# Patient Record
Sex: Female | Born: 1950 | Race: Black or African American | Hispanic: No | Smoking: Former smoker
Health system: Southern US, Community
[De-identification: ages and names within clinical notes are randomized; demographics above are authoritative.]

## PROBLEM LIST (undated history)

## (undated) DIAGNOSIS — C801 Malignant (primary) neoplasm, unspecified: Secondary | ICD-10-CM

## (undated) DIAGNOSIS — E785 Hyperlipidemia, unspecified: Secondary | ICD-10-CM

## (undated) DIAGNOSIS — M7660 Achilles tendinitis, unspecified leg: Secondary | ICD-10-CM

## (undated) DIAGNOSIS — Z923 Personal history of irradiation: Secondary | ICD-10-CM

## (undated) DIAGNOSIS — C9000 Multiple myeloma not having achieved remission: Secondary | ICD-10-CM

## (undated) DIAGNOSIS — R42 Dizziness and giddiness: Secondary | ICD-10-CM

## (undated) DIAGNOSIS — I10 Essential (primary) hypertension: Secondary | ICD-10-CM

## (undated) DIAGNOSIS — D509 Iron deficiency anemia, unspecified: Secondary | ICD-10-CM

## (undated) DIAGNOSIS — K219 Gastro-esophageal reflux disease without esophagitis: Secondary | ICD-10-CM

## (undated) DIAGNOSIS — E559 Vitamin D deficiency, unspecified: Secondary | ICD-10-CM

## (undated) DIAGNOSIS — E669 Obesity, unspecified: Secondary | ICD-10-CM

## (undated) DIAGNOSIS — M179 Osteoarthritis of knee, unspecified: Secondary | ICD-10-CM

## (undated) DIAGNOSIS — M1710 Unilateral primary osteoarthritis, unspecified knee: Secondary | ICD-10-CM

## (undated) DIAGNOSIS — G35 Multiple sclerosis: Secondary | ICD-10-CM

## (undated) HISTORY — PX: ROTATOR CUFF REPAIR: SHX139

## (undated) HISTORY — DX: Vitamin D deficiency, unspecified: E55.9

## (undated) HISTORY — DX: Hyperlipidemia, unspecified: E78.5

## (undated) HISTORY — DX: Osteoarthritis of knee, unspecified: M17.9

## (undated) HISTORY — PX: BREAST EXCISIONAL BIOPSY: SUR124

## (undated) HISTORY — PX: CHOLECYSTECTOMY: SHX55

## (undated) HISTORY — PX: ABDOMINAL HYSTERECTOMY: SHX81

## (undated) HISTORY — DX: Achilles tendinitis, unspecified leg: M76.60

## (undated) HISTORY — DX: Dizziness and giddiness: R42

## (undated) HISTORY — DX: Unilateral primary osteoarthritis, unspecified knee: M17.10

## (undated) HISTORY — DX: Iron deficiency anemia, unspecified: D50.9

## (undated) HISTORY — DX: Obesity, unspecified: E66.9

## (undated) HISTORY — PX: BREAST SURGERY: SHX581

---

## 1999-10-17 ENCOUNTER — Ambulatory Visit (HOSPITAL_COMMUNITY): Admission: RE | Admit: 1999-10-17 | Discharge: 1999-10-17 | Payer: Self-pay | Admitting: Neurology

## 2001-08-10 ENCOUNTER — Encounter: Admission: RE | Admit: 2001-08-10 | Discharge: 2001-08-10 | Payer: Self-pay | Admitting: Obstetrics and Gynecology

## 2001-08-10 ENCOUNTER — Encounter: Payer: Self-pay | Admitting: Obstetrics and Gynecology

## 2001-10-06 ENCOUNTER — Encounter (INDEPENDENT_AMBULATORY_CARE_PROVIDER_SITE_OTHER): Payer: Self-pay | Admitting: *Deleted

## 2001-10-06 ENCOUNTER — Ambulatory Visit (HOSPITAL_COMMUNITY): Admission: RE | Admit: 2001-10-06 | Discharge: 2001-10-06 | Payer: Self-pay | Admitting: Gastroenterology

## 2002-08-12 ENCOUNTER — Encounter: Payer: Self-pay | Admitting: Obstetrics and Gynecology

## 2002-08-12 ENCOUNTER — Encounter: Admission: RE | Admit: 2002-08-12 | Discharge: 2002-08-12 | Payer: Self-pay | Admitting: Obstetrics and Gynecology

## 2003-07-27 ENCOUNTER — Encounter: Admission: RE | Admit: 2003-07-27 | Discharge: 2003-07-27 | Payer: Self-pay | Admitting: Emergency Medicine

## 2004-04-15 ENCOUNTER — Emergency Department (HOSPITAL_COMMUNITY): Admission: EM | Admit: 2004-04-15 | Discharge: 2004-04-15 | Payer: Self-pay | Admitting: Emergency Medicine

## 2004-06-21 ENCOUNTER — Observation Stay (HOSPITAL_COMMUNITY): Admission: RE | Admit: 2004-06-21 | Discharge: 2004-06-22 | Payer: Self-pay | Admitting: General Surgery

## 2004-06-21 ENCOUNTER — Encounter (INDEPENDENT_AMBULATORY_CARE_PROVIDER_SITE_OTHER): Payer: Self-pay | Admitting: *Deleted

## 2004-06-29 ENCOUNTER — Emergency Department (HOSPITAL_COMMUNITY): Admission: EM | Admit: 2004-06-29 | Discharge: 2004-06-29 | Payer: Self-pay | Admitting: Emergency Medicine

## 2004-07-15 ENCOUNTER — Emergency Department (HOSPITAL_COMMUNITY): Admission: EM | Admit: 2004-07-15 | Discharge: 2004-07-15 | Payer: Self-pay | Admitting: Emergency Medicine

## 2004-08-21 ENCOUNTER — Encounter: Admission: RE | Admit: 2004-08-21 | Discharge: 2004-08-21 | Payer: Self-pay | Admitting: Obstetrics and Gynecology

## 2005-01-28 ENCOUNTER — Emergency Department (HOSPITAL_COMMUNITY): Admission: EM | Admit: 2005-01-28 | Discharge: 2005-01-28 | Payer: Self-pay | Admitting: Emergency Medicine

## 2005-02-03 ENCOUNTER — Encounter (INDEPENDENT_AMBULATORY_CARE_PROVIDER_SITE_OTHER): Payer: Self-pay | Admitting: Specialist

## 2005-02-03 ENCOUNTER — Ambulatory Visit (HOSPITAL_COMMUNITY): Admission: RE | Admit: 2005-02-03 | Discharge: 2005-02-03 | Payer: Self-pay | Admitting: Gastroenterology

## 2005-08-26 ENCOUNTER — Encounter: Admission: RE | Admit: 2005-08-26 | Discharge: 2005-08-26 | Payer: Self-pay | Admitting: Obstetrics and Gynecology

## 2005-09-09 ENCOUNTER — Encounter: Admission: RE | Admit: 2005-09-09 | Discharge: 2005-09-09 | Payer: Self-pay | Admitting: Obstetrics and Gynecology

## 2005-10-02 ENCOUNTER — Encounter: Admission: RE | Admit: 2005-10-02 | Discharge: 2005-10-02 | Payer: Self-pay | Admitting: General Surgery

## 2005-10-07 ENCOUNTER — Ambulatory Visit (HOSPITAL_BASED_OUTPATIENT_CLINIC_OR_DEPARTMENT_OTHER): Admission: RE | Admit: 2005-10-07 | Discharge: 2005-10-07 | Payer: Self-pay | Admitting: General Surgery

## 2005-10-07 ENCOUNTER — Encounter (INDEPENDENT_AMBULATORY_CARE_PROVIDER_SITE_OTHER): Payer: Self-pay | Admitting: *Deleted

## 2005-10-07 ENCOUNTER — Encounter: Admission: RE | Admit: 2005-10-07 | Discharge: 2005-10-07 | Payer: Self-pay | Admitting: General Surgery

## 2006-09-01 ENCOUNTER — Encounter: Admission: RE | Admit: 2006-09-01 | Discharge: 2006-09-01 | Payer: Self-pay | Admitting: Obstetrics and Gynecology

## 2007-09-06 ENCOUNTER — Encounter: Admission: RE | Admit: 2007-09-06 | Discharge: 2007-09-06 | Payer: Self-pay | Admitting: Obstetrics and Gynecology

## 2007-09-14 ENCOUNTER — Encounter: Admission: RE | Admit: 2007-09-14 | Discharge: 2007-09-14 | Payer: Self-pay | Admitting: Obstetrics and Gynecology

## 2008-04-23 ENCOUNTER — Emergency Department (HOSPITAL_COMMUNITY): Admission: EM | Admit: 2008-04-23 | Discharge: 2008-04-23 | Payer: Self-pay | Admitting: *Deleted

## 2008-09-07 ENCOUNTER — Encounter: Admission: RE | Admit: 2008-09-07 | Discharge: 2008-09-07 | Payer: Self-pay | Admitting: Emergency Medicine

## 2009-09-11 ENCOUNTER — Encounter: Admission: RE | Admit: 2009-09-11 | Discharge: 2009-09-11 | Payer: Self-pay | Admitting: Emergency Medicine

## 2009-09-14 ENCOUNTER — Encounter: Admission: RE | Admit: 2009-09-14 | Discharge: 2009-09-14 | Payer: Self-pay | Admitting: Emergency Medicine

## 2009-09-18 ENCOUNTER — Encounter: Admission: RE | Admit: 2009-09-18 | Discharge: 2009-09-18 | Payer: Self-pay | Admitting: Emergency Medicine

## 2010-09-01 ENCOUNTER — Encounter: Payer: Self-pay | Admitting: Obstetrics and Gynecology

## 2010-09-01 ENCOUNTER — Encounter: Payer: Self-pay | Admitting: Emergency Medicine

## 2010-09-02 ENCOUNTER — Other Ambulatory Visit: Payer: Self-pay | Admitting: Emergency Medicine

## 2010-09-02 DIAGNOSIS — Z1239 Encounter for other screening for malignant neoplasm of breast: Secondary | ICD-10-CM

## 2010-09-12 ENCOUNTER — Ambulatory Visit
Admission: RE | Admit: 2010-09-12 | Discharge: 2010-09-12 | Disposition: A | Discharge status: Home / Self Care | Payer: BC Managed Care – PPO | Source: Ambulatory Visit | Attending: Emergency Medicine | Admitting: Emergency Medicine

## 2010-09-12 DIAGNOSIS — Z1239 Encounter for other screening for malignant neoplasm of breast: Secondary | ICD-10-CM

## 2010-12-27 NOTE — Op Note (Signed)
NAME:  Morgan Espinoza, DOOLEY NO.:  [id number]   MEDICAL RECORD NO.:  [id number]          PATIENT TYPE:  OBV   LOCATION:  0284                         FACILITY:  [facility]   PHYSICIAN:  Gita Kudo, M.D. DATE OF BIRTH:  Oct 04, 1950   DATE OF PROCEDURE:  06/21/2004  DATE OF DISCHARGE:                                 OPERATIVE REPORT   OPERATIVE PROCEDURE:  Laparoscopic cholecystectomy with intraoperative  cholangiogram.   SURGEON:  Gita Kudo, M.D.   ASSISTANT:  Angelia Mould. Derrell Lolling, M.D.   ANESTHESIA:  General endotracheal.   PREOPERATIVE DIAGNOSIS:  Gallstones.   POSTOPERATIVE DIAGNOSIS:  Gallstones.  Normal cholangiogram.   CLINICAL SUMMARY:  A 61 year old female with abdominal pain and gallbladder  ultrasound showing stones.  Her liver function studies are normal.   OPERATIVE FINDINGS:  Patient had adhesions from her previous abdominal  procedures.  The gallbladder was thin-walled.  It was not acutely inflamed.  There were no problems with the cholangiogram, which looked normal.   OPERATIVE PROCEDURE:  Under satisfactory general endotracheal anesthesia,  the patient's abdomen was prepped and draped in a standard fashion.  She  received 1 gm of Ancef preop.  A total of 25 cc of 0.5% Marcaine with  epinephrine was infiltrated for postop analgesia.   A midline incision was made below the umbilicus and old sutures identified  and cut through.  The peritoneum was entered and controlled with a figure-of-  eight 0 Vicryl suture.  Finger dissection used to develop a plane and a  Hasson port placed and secured.  Good CO2 insufflation accomplished, and  then the camera placed.  Under direct vision, two #5 ports placed laterally  and a second #10 port medially.  Operating through the medial port, with the  lateral graspers giving excellent exposure, the cystic duct and artery each  were identified and dissected circumferentially.  The artery was controlled  with  multiple clips and divided.  A single clip placed on the cystic duct  near the gallbladder.  A percutaneous catheter used to obtain a good  cholangiogram.  The catheter withdrawn, and the cystic duct controlled with  multiple clips and divided as was the artery.  The gallbladder removed from  below-upward using coagulating current for hemostasis and dissection.  A  second posterior artery was identified and divided between clips.  The  gallbladder was thin-walled, and a hole made in it during the dissection  with spillage of clear bile that was suctioned away.  After completing the  dissection, the liver bed was checked for hemostasis by cautery.  The  gallbladder was placed in an EndoCatch bag and then the camera moved to the  upper port.  The gallbladder was removed through the umbilicus and tacked  into the bag without further problems or spillage.  Then the abdomen was  lavaged copiously with 2 liters of saline, and the returns were clear.  CO2  and ports released.  The midline closed with the previous figure-of-eight  suture as well as three interrupted 0 Vicryl sutures widely placed  through the  fascia because of a previous surgery.  Then subcu approximated  with 4-0 Vicryl.  Steri-Strips for skin.  Sterile absorbent dressings were  applied, and the patient went to the recovery room from the operating room  in good condition without complication.      MRL/MEDQ  D:  06/21/2004  T:  06/21/2004  Job:  [id number]   cc:   Brett Canales A. Cleta Alberts, M.D.  [address]  [place]  [place] [address]  Fax: [phone]

## 2010-12-27 NOTE — Op Note (Signed)
NAME:  Morgan Espinoza, Morgan Espinoza NO.:  [id number]   MEDICAL RECORD NO.:  [id number]          PATIENT TYPE:  AMB   LOCATION:  DSC                          FACILITY:  [facility]   PHYSICIAN:  Gita Kudo, M.D. DATE OF BIRTH:  31-Aug-1950   DATE OF PROCEDURE:  10/07/2005  DATE OF DISCHARGE:                                 OPERATIVE REPORT   OPERATIVE PROCEDURE:  Left breast biopsy with needle localization and  specimen mammogram.   SURGEON:  Gita Kudo, M.D.   ANESTHESIA:  MAC - IV sedation, local 1% Xylocaine.   PREOPERATIVE DIAGNOSIS:  Mass left breast, abnormal mammogram.   POSTOPERATIVE DIAGNOSIS:  Mass left breast, abnormal mammogram, pending  pathology.   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   OPERATIVE FINDINGS:  I went widely around the wire and took a generous  specimen.  Specimen mammogram showed that the lesion was removed.   OPERATIVE PROCEDURE:  Under satisfactory intravenous sedation, the patient  was positioned, prepped and draped in standard fashion.  A total of 30 mL of  1% Xylocaine was infiltrated for good analgesia.  A crescent shaped incision  made around the wire and then cautery was used to remove a large portion of  breast tissue widely around and deep to the wire and I only encountered the  tip of the wire at the very end, meaning to me, that the lesion was  included.  This was then sent for specimen mammogram after marking it with  sutures for the pathologist.  The wound was made dry by cautery, lavaged  with saline, and closed in layers with 3-0 Vicryl and skin approximated with  nylon.  A sterile absorbent dressing applied when we heard from x-ray that  the lesion was removed.  She will be followed as an outpatient. Sponge and  needles correct.           ______________________________  Gita Kudo, M.D.     MRL/MEDQ  D:   10/07/2005  T:  10/07/2005  Job:  [id number]   cc:   Brett Canales A. Cleta Alberts, M.D.  Fax: [phone]   S. Kyra Manges, M.D.  Fax: [phone]

## 2010-12-27 NOTE — Op Note (Signed)
NAME:  Morgan Espinoza, Morgan Espinoza                  ACCOUNT NO.:  [id number]   MEDICAL RECORD NO.:  [id number]          PATIENT TYPE:  AMB   LOCATION:  ENDO                         FACILITY:  [facility]   PHYSICIAN:  Anselmo Rod, M.D.  DATE OF BIRTH:  09/14/1950   DATE OF PROCEDURE:  02/03/2005  DATE OF DISCHARGE:                                 OPERATIVE REPORT   PROCEDURE PERFORMED:  Colonoscopy with cold biopsies times one.   ENDOSCOPIST:  Charna Elizabeth, M.D.   INSTRUMENT USED:  Olympus video colonoscope.   INDICATIONS FOR PROCEDURE:  The patient is a 60 year old African-American  female with a history of MS, undergoing screening colonoscopy to rule out  colonic polyps, masses, etc.  The patient has a history of colonic polyps  removed in the past.   PREPROCEDURE PREPARATION:  Informed consent was procured from the patient.  The patient was fasted for eight hours prior to the procedure and prepped  with a bottle of magnesium citrate and a gallon of GoLYTELY the night prior  to the procedure.  The risks and benefits of the procedure including a 10%  miss rate for colon polyps or cancers was discussed with the patient as  well.   PREPROCEDURE PHYSICAL:  The patient had stable vital signs.  Neck supple.  Chest clear to auscultation.  S1 and S2 regular.  Abdomen soft with normal  bowel sounds.   DESCRIPTION OF PROCEDURE:  The patient was placed in left lateral decubitus  position and sedated with 60 mg of Demerol and 6 mg of Versed in slow  incremental doses.  Once the patient was adequately sedated and maintained  on low flow oxygen and continuous cardiac monitoring, the Olympus video  colonoscope was advanced from the rectum to the cecum. The appendicular  orifice and ileocecal valve were clearly visualized and photographed.  There  was some residual stool in the colon and multiple washes were done.  No  erosions, ulcerations or diverticula were seen.  A small sessile polyp was  biopsied from  the rectosigmoid colon.  The rest of the exam was  unremarkable.  Retroflexion in the rectum revealed no abnormalities.   IMPRESSION:  Normal colonoscopy up to the cecum except for a small sessile  polyp biopsied from the rectosigmoid colon.   RECOMMENDATIONS:  1.  Repeat colonoscopy is recommended in the next five years unless the      patient develops any abnormal symptoms in the interim.  2.  Await pathology results.  3.  Avoid all nonsteroidals including aspirin for the next two weeks.  4.  Outpatient followup as need arises in the future.  The importance of a      high fiber diet with liberal fluid intake has been emphasized.       JNM/MEDQ  D:  02/03/2005  T:  02/03/2005  Job:  [id number]   cc:   Brett Canales A. Cleta Alberts, M.D.  [address]  [place]  [place] [address]  Fax: [phone]   Genene Churn. Love, M.D.  [address]  [address]  [place]  [place]  [address]  Fax: [phone]

## 2010-12-27 NOTE — Op Note (Signed)
   NAME:  Morgan Espinoza, Morgan Espinoza NO.:  [id number]   MEDICAL RECORD NO.:  [id number]                   PATIENT TYPE:   LOCATION:                                       FACILITY:   PHYSICIAN:  Anselmo Rod, M.D.               DATE OF BIRTH:  [birth year]   DATE OF PROCEDURE:  10/06/2001  DATE OF DISCHARGE:                                 OPERATIVE REPORT   PROCEDURE PERFORMED:  Colonoscopy with snare polypectomy x2.   ENDOSCOPIST:  Anselmo Rod, M.D.   INSTRUMENT USED:  Olympus video colonoscope.   INDICATIONS FOR PROCEDURE:  Iron deficiency anemia in a 60 year old African-  American female.  Rule out colonic polyps, masses, etc.   PROCEDURE PREPARATION:  Informed consent was procured from the patient.  The  patient fasted for eight hours prior to the procedure and prepped with a  bottle of magnesium citrate and a gallon of NuLytely the night prior to the  procedure.   PREPROCEDURE PHYSICAL:  VITAL SIGNS:  The patient had stable vital signs.  NECK:  Supple.  CHEST:  Clear to auscultation.  S1 and S2 regular.  ABDOMEN:  Soft with normal bowel sounds.  No masses palpable.   DESCRIPTION OF PROCEDURE:  The patient was placed in the left lateral  decubitus position and sedated with an additional 10 mg of Demerol and 1 mg  of Versed intravenously.  Once the patient was adequately sedated and  maintained on low flow oxygen and continuous cardiac monitoring, the Olympus  video colonoscope was advanced in the rectum to the cecum without  difficulty.  A small polyp was snared from the proximal right colon.  Another pedunculated polyp was snared from 75 cm.  The rest of the colonic  mucosa appeared healthy without lesions.   IMPRESSION:  Normal-appearing colon except for two polyps removed as  mentioned above. No other masses or polyps seen.  No evidence of  diverticulosis.    RECOMMENDATIONS:  1. Avoid all nonsteroidals, including aspirin for now.  2.  Await pathology results.  3. Outpatient followup in the next two weeks with further recommendations.                                               Anselmo Rod, M.D.    JNM/MEDQ  D:  07/17/2002  T:  07/17/2002  Job:  [id number]   cc:   Brett Canales A. Cleta Alberts, M.D.  [address]  [place]  [place] [address]  Fax: [phone]

## 2010-12-27 NOTE — Procedures (Signed)
[place]  Patient:    Morgan Espinoza, Morgan Espinoza                         MRN: [id number] Proc. Date: 10/17/99 Adm. Date:  [id number] Attending:  Erich Montane                           Procedure Report  CLINICAL INFORMATION:  This patient is being evaluated for a history of gait disorder and abnormal MRI study, rule out demyelinating disease.  OPERATOR:  Genene Churn. Love, M.D.  DESCRIPTION OF PROCEDURE:  The patient was prepped and draped in the left lateral decubitus position and was very tense during the procedure. Betadine and 1% Xylocaine were used.  The L4-L5 interspace was entered without difficulty. Opening pressure was 200 mmH2O and clear colorless CSF was obtained and sent for VDRL, angiotensin converting enzyme, protein, glucose, cell count, diff, IgG and oligoclonal IgG.  The patient tolerated the procedure well. DD:  10/17/99 TD:  10/18/99 Job: [id number]

## 2010-12-30 ENCOUNTER — Emergency Department (HOSPITAL_COMMUNITY): Payer: BC Managed Care – PPO

## 2010-12-30 ENCOUNTER — Emergency Department (HOSPITAL_COMMUNITY)
Admission: EM | Admit: 2010-12-30 | Discharge: 2010-12-30 | Disposition: A | Discharge status: Home / Self Care | Payer: BC Managed Care – PPO | Attending: Emergency Medicine | Admitting: Emergency Medicine

## 2010-12-30 DIAGNOSIS — Z90710 Acquired absence of both cervix and uterus: Secondary | ICD-10-CM | POA: Insufficient documentation

## 2010-12-30 DIAGNOSIS — G35 Multiple sclerosis: Secondary | ICD-10-CM | POA: Insufficient documentation

## 2010-12-30 DIAGNOSIS — Z9089 Acquired absence of other organs: Secondary | ICD-10-CM | POA: Insufficient documentation

## 2010-12-30 DIAGNOSIS — N200 Calculus of kidney: Secondary | ICD-10-CM | POA: Insufficient documentation

## 2010-12-30 DIAGNOSIS — M549 Dorsalgia, unspecified: Secondary | ICD-10-CM | POA: Insufficient documentation

## 2010-12-30 DIAGNOSIS — M542 Cervicalgia: Secondary | ICD-10-CM | POA: Insufficient documentation

## 2010-12-30 DIAGNOSIS — S335XXA Sprain of ligaments of lumbar spine, initial encounter: Secondary | ICD-10-CM | POA: Insufficient documentation

## 2010-12-30 DIAGNOSIS — M51379 Other intervertebral disc degeneration, lumbosacral region without mention of lumbar back pain or lower extremity pain: Secondary | ICD-10-CM | POA: Insufficient documentation

## 2010-12-30 DIAGNOSIS — Y92410 Unspecified street and highway as the place of occurrence of the external cause: Secondary | ICD-10-CM | POA: Insufficient documentation

## 2010-12-30 DIAGNOSIS — R079 Chest pain, unspecified: Secondary | ICD-10-CM | POA: Insufficient documentation

## 2010-12-30 DIAGNOSIS — I10 Essential (primary) hypertension: Secondary | ICD-10-CM | POA: Insufficient documentation

## 2010-12-30 DIAGNOSIS — M5137 Other intervertebral disc degeneration, lumbosacral region: Secondary | ICD-10-CM | POA: Insufficient documentation

## 2010-12-30 DIAGNOSIS — S139XXA Sprain of joints and ligaments of unspecified parts of neck, initial encounter: Secondary | ICD-10-CM | POA: Insufficient documentation

## 2010-12-30 DIAGNOSIS — R51 Headache: Secondary | ICD-10-CM | POA: Insufficient documentation

## 2010-12-30 LAB — DIFFERENTIAL
Basophils Relative: 0 % (ref 0–1)
Eosinophils Absolute: 0.2 10*3/uL (ref 0.0–0.7)
Eosinophils Relative: 3 % (ref 0–5)
Lymphs Abs: 2.9 10*3/uL (ref 0.7–4.0)
Monocytes Absolute: 0.5 10*3/uL (ref 0.1–1.0)
Monocytes Relative: 7 % (ref 3–12)

## 2010-12-30 LAB — BASIC METABOLIC PANEL
BUN: 14 mg/dL (ref 6–23)
Calcium: 8.7 mg/dL (ref 8.4–10.5)
Chloride: 105 mEq/L (ref 96–112)
Creatinine, Ser: 0.65 mg/dL (ref 0.4–1.2)
GFR calc non Af Amer: >60 mL/min (ref >60)

## 2010-12-30 LAB — CBC
HCT: 37.9 % (ref 36.0–46.0)
Hemoglobin: 12.1 g/dL (ref 12.0–15.0)
MCH: 28.5 pg (ref 26.0–34.0)
MCHC: 31.9 g/dL (ref 30.0–36.0)
MCV: 89.4 fL (ref 78.0–100.0)
Platelets: 184 K/uL (ref 150–400)
RBC: 4.24 MIL/uL (ref 3.87–5.11)
RDW: 12.6 % (ref 11.5–15.5)
WBC: 6.9 K/uL (ref 4.0–10.5)

## 2010-12-30 MED ORDER — IOHEXOL 300 MG/ML  SOLN
100.0000 mL | Freq: Once | INTRAMUSCULAR | Status: AC | PRN
  Administered 2010-12-30: 100 mL via INTRAVENOUS

## 2011-05-14 LAB — URINALYSIS, ROUTINE W REFLEX MICROSCOPIC
Glucose, UA: NEGATIVE
Ketones, ur: NEGATIVE
Protein, ur: NEGATIVE
Urobilinogen, UA: 1

## 2011-05-14 LAB — URINE CULTURE

## 2011-06-05 ENCOUNTER — Emergency Department (HOSPITAL_COMMUNITY): Payer: BC Managed Care – PPO

## 2011-06-05 ENCOUNTER — Emergency Department (HOSPITAL_COMMUNITY)
Admission: EM | Admit: 2011-06-05 | Discharge: 2011-06-05 | Disposition: A | Discharge status: Home / Self Care | Payer: BC Managed Care – PPO | Attending: Emergency Medicine | Admitting: Emergency Medicine

## 2011-06-05 ENCOUNTER — Encounter (HOSPITAL_COMMUNITY): Payer: Self-pay | Admitting: Family Medicine

## 2011-06-05 DIAGNOSIS — R079 Chest pain, unspecified: Secondary | ICD-10-CM | POA: Diagnosis present

## 2011-06-05 DIAGNOSIS — I10 Essential (primary) hypertension: Secondary | ICD-10-CM

## 2011-06-05 DIAGNOSIS — J45909 Unspecified asthma, uncomplicated: Secondary | ICD-10-CM | POA: Insufficient documentation

## 2011-06-05 DIAGNOSIS — G35 Multiple sclerosis: Secondary | ICD-10-CM | POA: Insufficient documentation

## 2011-06-05 DIAGNOSIS — R0789 Other chest pain: Secondary | ICD-10-CM

## 2011-06-05 DIAGNOSIS — Z79899 Other long term (current) drug therapy: Secondary | ICD-10-CM | POA: Insufficient documentation

## 2011-06-05 DIAGNOSIS — K219 Gastro-esophageal reflux disease without esophagitis: Secondary | ICD-10-CM | POA: Insufficient documentation

## 2011-06-05 DIAGNOSIS — Z9889 Other specified postprocedural states: Secondary | ICD-10-CM | POA: Insufficient documentation

## 2011-06-05 HISTORY — DX: Multiple sclerosis: G35

## 2011-06-05 HISTORY — DX: Essential (primary) hypertension: I10

## 2011-06-05 HISTORY — DX: Gastro-esophageal reflux disease without esophagitis: K21.9

## 2011-06-05 LAB — DIFFERENTIAL

## 2011-06-05 LAB — COMPREHENSIVE METABOLIC PANEL
Alkaline Phosphatase: 101 U/L (ref 39–117)
BUN: 15 mg/dL (ref 6–23)
Chloride: 107 mEq/L (ref 96–112)
GFR calc Af Amer: >90 mL/min (ref >90)
Glucose, Bld: 103 mg/dL — ABNORMAL HIGH (ref 70–99)
Potassium: 3.8 mEq/L (ref 3.5–5.1)
Total Bilirubin: 0.3 mg/dL (ref 0.3–1.2)

## 2011-06-05 LAB — CBC
Hemoglobin: 11.3 g/dL — ABNORMAL LOW (ref 12.0–15.0)
MCH: 29.2 pg (ref 26.0–34.0)
MCV: 90.7 fL (ref 78.0–100.0)
RBC: 3.87 MIL/uL (ref 3.87–5.11)

## 2011-06-05 LAB — POCT I-STAT TROPONIN I: Troponin i, poc: 0.01 ng/mL (ref 0.00–0.08)

## 2011-06-05 NOTE — H&P (Signed)
Family Medicine Teaching [facility] Admission History and Physical  Patient name: Morgan Espinoza Medical record number: [id number] Date of birth: 03/17/1951 Age: 60 y.o. Gender: female  Primary Care Provider: Provider Not In System  Chief Complaint: Chest pain History of Present Illness: Morgan Espinoza is a 60 y.o. year old female presenting with chest pain. The patient reports that yesterday afternoon, she had a large meal, and draped her yard. She was feeling some indigestion at night before she went to bed, so she drank a Sprite and then went to bed. She awoke at around 2 in the morning with central chest and bilateral side pain. The pain was achy in character, did not have any radiation, was not related to exertion, and was made somewhat worse when lying on either side. There was no change when going from supine to standing. She proceeded to get up, use the restroom, and drinks a more Sprite. The pain subsided slightly, and she discovered that if she lay flat on her back she was able to rest. She then went back to sleep. In the morning she was still having problems with this aching pain. Accordingly she went to [facility] urgent care where she was seen by her primary care physician Dr. Elita Quick. He was concerned by the chest pain, ordered an EKG, and noted some mild, nonspecific T wave changes. He sent her to the emergency department for further evaluation.  In the emergency department the patient's pain was initially reported to be a 5/10, substernal, aching, with no radiation. The patient was not given any pain medications for nitroglycerin. Her pain spontaneously resolve, and at the time of this consultation she was not in pain. She did report she had occasional twinges in her side, there were 1/10, lasting less than one second, and resolving spontaneously. EKG obtained at the time of presentation was only notable for some nonspecific T wave changes in lead V3. Otherwise was unremarkable.  Patient  Active Problem List  Diagnoses  . Chest pain  . Hypertension  . Asthma  . GERD (gastroesophageal reflux disease)  . MS (multiple sclerosis)   Past Medical History: Past Medical History  Diagnosis Date  . Hypertension   . GERD (gastroesophageal reflux disease)   . Asthma   . Multiple sclerosis     Past Surgical History: Past Surgical History  Procedure Date  . Cholecystectomy   . Abdominal hysterectomy     Social History: History   Social History  . Marital Status: Widowed    Spouse Name: N/A    Number of Children: N/A  . Years of Education: N/A   Social History Main Topics  . Smoking status: Never Smoker   . Smokeless tobacco: Never Used  . Alcohol Use: No  . Drug Use: No  . Sexually Active:    Other Topics Concern  . None   Social History Narrative  . None    Family History: History reviewed. No pertinent family history.  Allergies: Allergies  Allergen Reactions  . Dristan   . Requip   . Robitussin (Alcohol Free) (Guaifenesin)   . Tramadol     No current facility-administered medications for this encounter.   Current Outpatient Prescriptions  Medication Sig Dispense Refill  . amLODipine (NORVASC) 10 MG tablet Take 10 mg by mouth daily.        . ferrous sulfate 325 (65 FE) MG tablet Take 325 mg by mouth daily with breakfast.        . interferon beta-1b (  BETASERON) 0.3 MG injection Inject 0.25 mg into the skin every other day.        . losartan (COZAAR) 50 MG tablet Take 50 mg by mouth daily.        Marland Kitchen omeprazole (PRILOSEC) 20 MG capsule Take 20 mg by mouth daily.        Marland Kitchen oxyCODONE-acetaminophen (PERCOCET) 5-325 MG per tablet Take 1 tablet by mouth every 4 (four) hours as needed.         Review Of Systems: Per HPI with the following additions: Recently she has noticed a floater in her right eye that is new.  She has had some intermittent headache. Otherwise 12 point review of systems was performed and was unremarkable.  Physical Exam: Pulse: 80   Blood Pressure: 143/57 RR: 18   O2: 97 on RA Temp: 98.2  General: alert, cooperative and appears stated age HEENT: PERRLA, extra ocular movement intact, oropharynx clear, no lesions and neck supple with midline trachea Heart: S1, S2 normal, no murmur, rub or gallop, regular rate and rhythm.  There is mild tenderness to palpation over the entire chest and sides.  The patient says that palpation reproduces her pain from earlier. Lungs: clear to auscultation, no wheezes or rales and unlabored breathing Abdomen: abdomen is soft without significant tenderness, masses, organomegaly or guarding Extremities: extremities normal, atraumatic, no cyanosis or edema and mild tenderness to palpation over the anterior lower legs, below the knee. Skin:no rashes, no ecchymoses, no petechiae Neurology: normal without focal findings, mental status, speech normal, alert and oriented x3 and PERLA  Labs and Imaging: Results for orders placed during the hospital encounter of 06/05/11 (from the past 24 hour(s))  DIFFERENTIAL     Status: Normal (Preliminary result)   Collection Time   06/05/11 11:30 AM      Component Value Range   Neutrophils Relative PENDING  43 - 77 (%)   Neutro Abs PENDING  1.7 - 7.7 (K/uL)   Band Neutrophils PENDING  0 - 10 (%)   Lymphocytes Relative PENDING  12 - 46 (%)   Lymphs Abs PENDING  0.7 - 4.0 (K/uL)   Monocytes Relative PENDING  3 - 12 (%)   Monocytes Absolute PENDING  0.1 - 1.0 (K/uL)   Eosinophils Relative PENDING  0 - 5 (%)   Eosinophils Absolute PENDING  0.0 - 0.7 (K/uL)   Basophils Relative PENDING  0 - 1 (%)   Basophils Absolute PENDING  0.0 - 0.1 (K/uL)   WBC Morphology PENDING     RBC Morphology PENDING     Smear Review PENDING     nRBC PENDING  0 (/100 WBC)   Metamyelocytes Relative PENDING     Myelocytes PENDING     Promyelocytes Absolute PENDING     Blasts PENDING    CBC     Status: Abnormal   Collection Time   06/05/11 11:30 AM      Component Value Range   WBC  4.6  4.0 - 10.5 (K/uL)   RBC 3.87  3.87 - 5.11 (MIL/uL)   Hemoglobin 11.3 (*) 12.0 - 15.0 (g/dL)   HCT 16.1 (*) 09.6 - 46.0 (%)   MCV 90.7  78.0 - 100.0 (fL)   MCH 29.2  26.0 - 34.0 (pg)   MCHC 32.2  30.0 - 36.0 (g/dL)   RDW 04.5  40.9 - 81.1 (%)   Platelets 102 (*) 150 - 400 (K/uL)  COMPREHENSIVE METABOLIC PANEL     Status: Abnormal   Collection  Time   06/05/11 11:30 AM      Component Value Range   Sodium 141  135 - 145 (mEq/L)   Potassium 3.8  3.5 - 5.1 (mEq/L)   Chloride 107  96 - 112 (mEq/L)   CO2 26  19 - 32 (mEq/L)   Glucose, Bld 103 (*) 70 - 99 (mg/dL)   BUN 15  6 - 23 (mg/dL)   Creatinine, Ser 1.61  0.50 - 1.10 (mg/dL)   Calcium 8.8  8.4 - 09.6 (mg/dL)   Total Protein 7.0  6.0 - 8.3 (g/dL)   Albumin 3.5  3.5 - 5.2 (g/dL)   AST 32  0 - 37 (U/L)   ALT 27  0 - 35 (U/L)   Alkaline Phosphatase 101  39 - 117 (U/L)   Total Bilirubin 0.3  0.3 - 1.2 (mg/dL)   GFR calc non Af Amer 89 (*) >90 (mL/min)   GFR calc Af Amer >90  >90 (mL/min)  POCT I-STAT TROPONIN I     Status: Normal   Collection Time   06/05/11 11:48 AM      Component Value Range   Troponin i, poc 0.00  0.00 - 0.08 (ng/mL)   Comment 3            CXR: Borderline cardiomegaly with no active cardiopulmonary disease. EKG: Sinus rhythm, rate of 74, nonspecific T wave changes in lead V3.  No acute abnormality.   Assessment and Plan: BRAILYN KILLION is a 60 y.o. year old female presenting with atypical chest pain. 1. Chest pain: The patient's chest pain is highly atypical in nature. She is currently chest pain-free, with no concerning EKG changes, and a negative troponin greater than 8 hours after the onset of her discomfort. Her only cardiovascular risk factor is hypertension, which she reports has been well controlled. Accordingly she has a TIMI score of zero, negative cardiac enzymes, and an unremarkable EKG. We feel this patient is better suited by an outpatient evaluation and have arranged for her to follow up with  [facility] cardiology on October 31 at 3 PM. We have requested that she be provided with sublingual nitroglycerin upon discharge from the emergency department and have instructed her to take one if her pain returns. She is instructed to return to the emergency department if her pain is not relieved by a single nitroglycerin, or if it returns shortly after disappearing. We would also recommend that she followup with her primary care physician sometime within the next week. We feel that the patient's pain is most likely musculoskeletal in nature, although a GI component cannot be excluded. 2. Gastroesophageal reflux disease: The patient's pain may be related to her gastroesophageal reflux disease. Accordingly we will recommended that she increase her dose of omeprazole to 40 mg twice daily until she is seen by cardiology. 3. Hypertension: Patient's blood pressure is well controlled here in the emergency department. We would not recommend any changes to her outpatient regimen. 4. Multiple sclerosis: No changes in her outpatient management. 5. FEN/GI: Heart healthy diet 6. Disposition: Home with outpatient cardiology followup  Morgan Espinoza 06/05/2011, 5:04 PM

## 2011-06-06 NOTE — H&P (Signed)
I examined Ms Morgan Espinoza.  I discussed with Dr Louanne Belton.  I agree with his findings and plans as documented in his consultation note.  Patient is stable for discharge to community.  Further details of my note are in paper chart.  Derrious Bologna D

## 2011-06-15 NOTE — Consult Note (Signed)
NAME:  CHARICE, ZUNO NO.:  [id number]  MEDICAL RECORD NO.:  [id number]  LOCATION:  [facility]                         FACILITY:  [facility]  PHYSICIAN:  Leighton Roach Miana Politte, M.D.DATE OF BIRTH:  Jul 14, 1951  DATE OF CONSULTATION:  06/05/2011 DATE OF DISCHARGE:  06/05/2011                                CONSULTATION   PRIMARY CARE PROVIDER:  [name]. Cleta Alberts, MD, at [facility].  CHIEF COMPLAINT:  Chest pain.  HISTORY OF PRESENT ILLNESS:  Ms. Rufino is a 60 year old female presenting with chest pain.  The patient reports that yesterday afternoon she had a large meal and racked her yard.  She was feeling some indigestion at night before she went to bed, so she drank Sprite and then went to bed.  She awoke at around 2 p.m. in the morning with central and bilateral side pain.  Pain was achy in character, does not have any radiation, was not related to exertion, and made somewhat worse by lying on either side.  There were no changes going from supine tostanding.  She proceeded to get up, used the restroom, and drank another Sprite.  Pain proceeded to subside slightly and she discovered that if she lies flat on her back she was able the rest.  Accordingly, she went back to sleep in the morning, when she was still having problems with the aching pain, she went to [facility] where she was seen by her primary care physician.  He was concerned about her chest pain and ordered an EKG and noted some mild, nonspecific T-wave changes.  They sent to the emergency department for further evaluation.  In the emergency department, the patient's pain was initially reported to be a 5/10, substernal, aching, and with no radiation.  The patient was not given any pain medications or nitroglycerin in the emergency department.  Her pain spontaneously resolved and at the time of consultation she was not in any pain.  She did report that she had occasional twinges in her side, but that  these were 1/10, lasting less than 1 second, resolving spontaneously, not related to exertion, and not radiating.  EKG obtained at the time of presentation was only notable for some nonspecific T-wave changes in leads V3 and V4.  It was otherwise unremarkable.  PAST MEDICAL HISTORY: 1. Hypertension. 2. Gastroesophageal reflux disease. 3. Asthma. 4. Multiple sclerosis.  PAST SURGICAL HISTORY:  Cholecystectomy and abdominal hysterectomy.  SOCIAL HISTORY:  The patient is widowed, does not smoke, no alcohol or tobacco or illicit substances.  FAMILY HISTORY:  There is no pertinent family history.  ALLERGIES: 1. DRISTAN. 2. REQUIP. 3. ROBITUSSIN. 4. TRAMADOL.  MEDICATIONS: 1. Norvasc 10 mg by mouth daily. 2. Ferrous sulfate 325 mg by mouth daily. 3. Interferon 0.25 mg injected into skin every other day. 4. Cozaar 50 mg by mouth daily. 5. Omeprazole 20 mg by mouth daily. 6. Percocet 5/325 one tablet by mouth every 4 hours as needed for     pain.  REVIEW OF SYSTEMS:  Per HPI with the following additions:  She has recently noticed a floater in her right eye that is new over the last month.  She does have some intermittent headaches.  Otherwise, 12-point review of systems was performed and was unremarkable.  PHYSICAL EXAM:  VITAL SIGNS:  Pulse 80, blood pressure 143/57, respirations 18, O2 sats 97% on room air, and temperature is 98.2 degrees Fahrenheit. GENERAL:  Alert and oriented, appears stated age. HEENT:  Pupils are equal, round, and reactive to light.  Extraocular movements are intact.  Oropharynx is clear.  No lesions. NECK:  Supple with midline trachea. HEART:  Normal S1 and S2.  No murmurs, rubs, or gallops.  Regular rate and rhythm.  There is mild tenderness to palpation over entire chest and sides of the chest.  The patient has palpation reproduces her pain from earlier in the day. LUNGS:  Clear to auscultation bilaterally with no wheezes or rales and with  unlabored breathing. ABDOMEN:  Soft, nontender, and nondistended.  No mass is appreciated. EXTREMITIES:  Normal.  No cyanosis or edema.  Mild tenderness to palpation over the anterior lower legs below the knees. SKIN:  No rash is noted. NEUROLOGY:  Normal without focal findings.  Alert and oriented x3.  LABS AND IMAGING:  CBC showed a white count of 4.6, hemoglobin 11.3, and platelets of 102.  Comprehensive metabolic panel showed a sodium of 141, potassium 3.8, chloride 107, bicarb 26, BUN 15, creatinine 0.78, and glucose of 103.  Liver enzymes were normal.  Point-of-care troponins were 0.00, which is negative.  Chest x-ray showed borderline cardiomegaly with no active cardiopulmonary disease and EKG showed sinus rhythm with a rate of 74, nonspecific T-wave changes in lead V3 and possibly V4.  There are no acute abnormalities.  ASSESSMENT AND PLAN:  Ms. Strough is a 60 year old female presenting with atypical chest pain. 1. Chest pain:  The patient's chest pain is highly atypical in nature.     She is currently chest pain free with no concerning EKG changes and     negative troponin greater than 8 hours after the onset of her     discomfort.  (At the time of consultation, it is approximately 16     hours since the onset of her chest pain) her only cardiovascular     risk factor is hypertension which she reports is well controlled.     Accordingly, she has a TIMI score of 0, negative cardiac enzymes,     and a relatively unremarkable EKG.  We feel this patient is better     suited by an outpatient evaluation and therefore arranged for her     to follow up with [facility] on June 11, 2011, at 3 p.m.     We have requested that she be provided with sublingual     nitroglycerin upon discharged from the emergency department and I     have instructed her to take 1 tablet if her pain returns.  She is     instructed to return to the emergency department if her pain does     not relieve  by single nitroglycerin, or if it returns shortly after     disappearing.  At this point in time, she would be appropriate for     admission and further cardiac workup.  We also recommend that she     follow up with her primary care physician some time within the next     week.  We feel that the patient's pain is most likely     musculoskeletal in nature, although a GI component cannot be     excluded.  2. Gastroesophageal reflux disease:  The patient's pain may be related     to her gastroesophageal reflux disease.  Accordingly, we will     recommend that she increase her dose of omeprazole to 40 mg twice     daily until she is seen by Cardiology. 3. Hypertension:  The patient's blood pressure was well controlled     here in the emergency department.  No changes recommended in her     outpatient regimen. 4. Multiple sclerosis:  No changes in outpatient management. 5. Fluids, electrolytes, nutrition and gastrointestinal:  Heart-     healthy diet. 6. Disposition:  Home with outpatient cardiology followup.    ______________________________ Majel Homer, MD   ______________________________ Leighton Roach Reyn Faivre, M.D.    ER/MEDQ  D:  06/06/2011  T:  06/06/2011  Job:  [id number]  Electronically Signed by Manuela Neptune MD on 06/14/2011 08:03:02 PM Electronically Signed by Acquanetta Belling M.D. on 06/15/2011 05:45:55 AM

## 2011-07-16 ENCOUNTER — Ambulatory Visit (INDEPENDENT_AMBULATORY_CARE_PROVIDER_SITE_OTHER): Payer: BC Managed Care – PPO

## 2011-07-16 DIAGNOSIS — M79609 Pain in unspecified limb: Secondary | ICD-10-CM

## 2011-07-16 DIAGNOSIS — G35 Multiple sclerosis: Secondary | ICD-10-CM

## 2011-07-16 DIAGNOSIS — E538 Deficiency of other specified B group vitamins: Secondary | ICD-10-CM

## 2011-08-09 ENCOUNTER — Ambulatory Visit (INDEPENDENT_AMBULATORY_CARE_PROVIDER_SITE_OTHER): Payer: BC Managed Care – PPO

## 2011-08-09 DIAGNOSIS — D649 Anemia, unspecified: Secondary | ICD-10-CM

## 2011-08-09 DIAGNOSIS — M79609 Pain in unspecified limb: Secondary | ICD-10-CM

## 2011-08-09 DIAGNOSIS — R071 Chest pain on breathing: Secondary | ICD-10-CM

## 2011-08-15 ENCOUNTER — Ambulatory Visit (INDEPENDENT_AMBULATORY_CARE_PROVIDER_SITE_OTHER): Payer: BC Managed Care – PPO

## 2011-08-15 DIAGNOSIS — D649 Anemia, unspecified: Secondary | ICD-10-CM

## 2011-08-15 DIAGNOSIS — R300 Dysuria: Secondary | ICD-10-CM

## 2011-08-17 ENCOUNTER — Ambulatory Visit (INDEPENDENT_AMBULATORY_CARE_PROVIDER_SITE_OTHER): Payer: BC Managed Care – PPO

## 2011-08-17 DIAGNOSIS — J019 Acute sinusitis, unspecified: Secondary | ICD-10-CM

## 2011-08-17 DIAGNOSIS — J029 Acute pharyngitis, unspecified: Secondary | ICD-10-CM

## 2011-08-17 DIAGNOSIS — G35 Multiple sclerosis: Secondary | ICD-10-CM

## 2011-09-08 ENCOUNTER — Ambulatory Visit (INDEPENDENT_AMBULATORY_CARE_PROVIDER_SITE_OTHER): Payer: BC Managed Care – PPO

## 2011-09-08 DIAGNOSIS — M62838 Other muscle spasm: Secondary | ICD-10-CM

## 2011-09-08 DIAGNOSIS — M79609 Pain in unspecified limb: Secondary | ICD-10-CM

## 2011-09-14 ENCOUNTER — Ambulatory Visit (INDEPENDENT_AMBULATORY_CARE_PROVIDER_SITE_OTHER): Payer: BC Managed Care – PPO | Admitting: Physician Assistant

## 2011-09-14 VITALS — BP 174/92 | HR 88 | Temp 98.0°F | Resp 18 | Ht 60.0 in | Wt 225.0 lb

## 2011-09-14 DIAGNOSIS — M79669 Pain in unspecified lower leg: Secondary | ICD-10-CM

## 2011-09-14 DIAGNOSIS — M79609 Pain in unspecified limb: Secondary | ICD-10-CM

## 2011-09-14 MED ORDER — OXYCODONE-ACETAMINOPHEN 5-325 MG PO TABS: 1.0000 | ORAL_TABLET | ORAL | Status: DC | PRN

## 2011-09-14 MED ORDER — CEPHALEXIN 500 MG PO CAPS: 500.0000 mg | ORAL_CAPSULE | Freq: Two times a day (BID) | ORAL | Status: AC

## 2011-09-14 MED ORDER — MELOXICAM 15 MG PO TABS: 15.0000 mg | ORAL_TABLET | Freq: Every day | ORAL | Status: DC

## 2011-09-14 NOTE — Patient Instructions (Signed)
Stop doxycylcline.  Start cephalexin. Return if symptoms worsen.

## 2011-09-14 NOTE — Progress Notes (Signed)
  Subjective:    Patient ID: Morgan Espinoza, female    DOB: 1950/12/12, 61 y.o.   MRN: [id number]  HPI  Ms. Shellhammer returns today for follow up on Left lower leg pain/swelling. Was seen here by Dr. Cleta Alberts 09/08/11.  Started on Doxy and ordered Doppler, that will be performed on 2/6, per pt. Ms. Rheaume says that her pain has worsened and feels her leg is more red.  Denies F/C. Tolerating doycycine   Review of Systems  Respiratory: Negative for chest tightness and shortness of breath.   Musculoskeletal:       Pain, swelling  Skin: Positive for color change.       Objective:   Physical Exam  Constitutional: Vital signs are normal. She appears well-developed and well-nourished.  Cardiovascular: Intact distal pulses.   Pulses:      Dorsalis pedis pulses are 2+ on the left side.       1 + edema anterior left lower extremity  Musculoskeletal:       Left ankle: She exhibits swelling. She exhibits no ecchymosis.  Skin: Skin is warm. There is erythema.       Red, dusky skin change medial aspect of left lower leg.           Assessment & Plan:  Switch to Keflex (pt has had amoxicillin in past) D/C doxycycline Go to Doppler appointment Wed. Return if symptoms worsen. Refill Mobic, Percocet

## 2011-09-15 ENCOUNTER — Telehealth: Payer: Self-pay

## 2011-09-15 NOTE — Telephone Encounter (Signed)
Dr. Davonna Belling prescribed patient some pain medication and patient states it got her to woozy feeling and she would like to know if Dr wants her to half the medication.

## 2011-09-23 ENCOUNTER — Ambulatory Visit (INDEPENDENT_AMBULATORY_CARE_PROVIDER_SITE_OTHER): Payer: BC Managed Care – PPO | Admitting: Family Medicine

## 2011-09-23 DIAGNOSIS — M79609 Pain in unspecified limb: Secondary | ICD-10-CM

## 2011-09-23 DIAGNOSIS — M7989 Other specified soft tissue disorders: Secondary | ICD-10-CM

## 2011-09-23 DIAGNOSIS — L03119 Cellulitis of unspecified part of limb: Secondary | ICD-10-CM

## 2011-09-23 DIAGNOSIS — M79669 Pain in unspecified lower leg: Secondary | ICD-10-CM

## 2011-09-23 LAB — POCT CBC
Granulocyte percent: 46.6 %G (ref 37–80)
HCT, POC: 38.9 % (ref 37.7–47.9)
Hemoglobin: 11.8 g/dL — AB (ref 12.2–16.2)
POC Granulocyte: 2.6 (ref 2–6.9)
POC LYMPH PERCENT: 46.4 %L (ref 10–50)
RDW, POC: 14.2 %

## 2011-09-23 MED ORDER — DOXYCYCLINE HYCLATE 100 MG PO CAPS: 100.0000 mg | ORAL_CAPSULE | Freq: Two times a day (BID) | ORAL | Status: DC

## 2011-09-23 NOTE — Progress Notes (Signed)
Subjective:    Patient ID: Morgan Espinoza, female    DOB: February 21, 1951, 61 y.o.   MRN: [id number]  HPI Morgan Espinoza is a 61 y.o. female with L leg swelling - present for 1 week prior to eval with Dr. Cleta Alberts 09/08/11. Does not know if may have injured/bumped leg. Rx doxycycline for cellulitis, and doppler LE did not indicate any blood clot.  Seen in follow up with Kennedy Bucker, [facility] 09/14/11.  Doxycycline discontinued, and Keflex started.  Continued mobic and percocet as needed (did not take percocet due to side effects).  Still taking keflex - 500mg  BID, but leg still sore, and feels like heat from buttocks down back of leg for last week. No objective fevers.  No hx DVT.  Review of Systems  Constitutional: Negative for fever and chills.  Musculoskeletal: Negative for arthralgias.       Denies pain in groin or hip joint itself.  Skin: Positive for color change. Negative for wound.       Feels warm, swollen in back of thigh, and red/sore/swollen in front of lower leg.       Objective:   Physical Exam  Constitutional: She is oriented to person, place, and time. She appears well-developed.  HENT:  Head: Normocephalic and atraumatic.  Musculoskeletal:       Left hip: She exhibits normal range of motion and no tenderness.  Neurological: She is alert and oriented to person, place, and time.  Skin: Skin is warm and intact. No ecchymosis and no lesion noted. There is erythema.          No wounds noted - skin intact, Neurovasc intact distally with warm toes, cap refill less than 1 second.  Psychiatric: She has a normal mood and affect.   . Results for orders placed in visit on 09/23/11  POCT CBC      Component Value Range   WBC 5.5  4.6 - 10.2 (K/uL)   Lymph, poc 2.6  0.6 - 3.4    POC LYMPH PERCENT 46.4  10 - 50 (%L)   MID (cbc) 0.4  0 - 0.9    POC MID % 7.0  0 - 12 (%M)   POC Granulocyte 2.6  2 - 6.9    Granulocyte percent 46.6  37 - 80 (%G)   RBC 4.22  4.04 - 5.48 (M/uL)   Hemoglobin 11.8  (*) 12.2 - 16.2 (g/dL)   HCT, POC 47.8  29.5 - 47.9 (%)   MCV 92.2  80 - 97 (fL)   MCH, POC 28.0  27 - 31.2 (pg)   MCHC 30.3 (*) 31.8 - 35.4 (g/dL)   RDW, POC 62.1     Platelet Count, POC 220  142 - 424 (K/uL)   MPV 9.0  0 - 99.8 (fL)     Prior ov's reviewed 09/17/11 LE doppler results reviewed (negative for thrombus or thrombophlebitis) - see scanned copy.    Assessment & Plan:   1. Pain, lower leg    2. Cellulitis, leg  POCT CBC   Negative doppler reviewed from 6 days ago.  Less likely dvt.  Initially on doxycycline, then keflex, with increase erythema and area of burning past 7 days.  Burning symptoms may be lumbosacral in origin.  Lower extremity erythema from stasis dermatitis vs cellulitis vs superficial thrombophlebitis. Reassuring CBC.  Continue keflex, and restart doxycycline 100mg  bid, as symptoms subjectively worse on just keflex. Warm compresses 3-4 times per day,elevate leg as able, and continue meloxicam  each day to help with pain and inflammation.  If increased pain, can take 1/2 of oxycodone and be careful of dizziness.  Recheck with Dr. Cleta Alberts in 2 days (between 8 am and 11 am on Thursday 09/25/11). Return to the clinic or go to the nearest emergency room if any worsening or new symptoms occur.

## 2011-09-23 NOTE — Patient Instructions (Addendum)
Continue cephalexin and restart doxycycline 100mg  twice per day, as symptoms seem to have worsened on just cephalexin. Apply warm compresses 3-4 times per day to the affected area, continue meloxicam each day to help with pain and inflammation. Elevate leg as able, If increased pain, can take 1/2 of oxycodone pill and be careful of dizziness as a side effect.  Recheck with Dr. Cleta Alberts in 2 days (between 8 am and 11 am on Thursday 09/25/11).  Return to the clinic or go to the nearest emergency room if any worsening or new symptoms occur, including any new shortness of breath, chest pain, or redness in leg is spreading.

## 2011-10-01 ENCOUNTER — Ambulatory Visit (INDEPENDENT_AMBULATORY_CARE_PROVIDER_SITE_OTHER): Payer: BC Managed Care – PPO | Admitting: Emergency Medicine

## 2011-10-01 VITALS — BP 161/119 | HR 90 | Temp 97.5°F | Resp 18 | Wt 227.8 lb

## 2011-10-01 DIAGNOSIS — I8390 Asymptomatic varicose veins of unspecified lower extremity: Secondary | ICD-10-CM

## 2011-10-01 DIAGNOSIS — M79609 Pain in unspecified limb: Secondary | ICD-10-CM

## 2011-10-01 DIAGNOSIS — M79606 Pain in leg, unspecified: Secondary | ICD-10-CM

## 2011-10-01 NOTE — Progress Notes (Signed)
  Subjective:    Patient ID: Morgan Espinoza, female    DOB: [birth year], 61 y.o.   MRN: [id number]  HPI patient is with persistent pain in her left lower leg. Pain is worst just above the left medial malleolus. She's had difficulty getting her support stockings on.    Review of Systems she continues under treatment with Dr. Sandria Manly for her MS     Objective:   Physical Exam there's tenderness to palpation just above the medial malleolus over the distal left greater saphenous. There is no induration there is no redness. Dorsalis pedis and posterior tibial pulses are normal. Previous venous Doppler of the left leg was normal      Assessment & Plan:  Assessment persistent left leg pain secondary to venous disease. She is going to try and not wear her support hose. Her to one half tablet of the Percocet she has at home to see if she can get some relief.

## 2011-10-03 ENCOUNTER — Other Ambulatory Visit: Payer: Self-pay | Admitting: *Deleted

## 2011-10-03 MED ORDER — FERROUS SULFATE 325 (65 FE) MG PO TABS: 325.0000 mg | ORAL_TABLET | Freq: Every day | ORAL | Status: DC

## 2011-10-08 ENCOUNTER — Ambulatory Visit (INDEPENDENT_AMBULATORY_CARE_PROVIDER_SITE_OTHER): Payer: BC Managed Care – PPO | Admitting: Emergency Medicine

## 2011-10-08 DIAGNOSIS — J029 Acute pharyngitis, unspecified: Secondary | ICD-10-CM

## 2011-10-08 DIAGNOSIS — D630 Anemia in neoplastic disease: Secondary | ICD-10-CM

## 2011-10-08 DIAGNOSIS — K591 Functional diarrhea: Secondary | ICD-10-CM

## 2011-10-08 LAB — IFOBT (OCCULT BLOOD): IFOBT: NEGATIVE

## 2011-10-08 LAB — POCT CBC
HCT, POC: 36.8 % — AB (ref 37.7–47.9)
MCH, POC: 27.5 pg (ref 27–31.2)
MCV: 91 fL (ref 80–97)
MID (cbc): 0.5 (ref 0–0.9)
Platelet Count, POC: 242 10*3/uL (ref 142–424)
RBC: 4.04 M/uL (ref 4.04–5.48)
WBC: 5.3 10*3/uL (ref 4.6–10.2)

## 2011-10-08 MED ORDER — FIRST-DUKES MOUTHWASH MT SUSP: OROMUCOSAL | Status: DC

## 2011-10-08 MED ORDER — CYANOCOBALAMIN 1000 MCG/ML IJ SOLN
1000.0000 ug | Freq: Once | INTRAMUSCULAR | Status: AC
  Administered 2011-10-08: 1000 ug via INTRAMUSCULAR

## 2011-10-08 NOTE — Progress Notes (Signed)
  Subjective:    Patient ID: Morgan Espinoza, female    DOB: 12/25/1950, 62 y.o.   MRN: [id number]  HPI patient doing well until yesterday when she starting having loose stools associated with a sore throat and swollen glands. She denies having chest pain shortness of breath or other symptoms. She denies having abdominal pain but states every time she goes to urinate she has     Review of Systems patient under treatment for MS. She is also on iron daily.     Objective:   Physical Exam  Constitutional: She appears well-developed and well-nourished.  HENT:  Head: Normocephalic.  Eyes: Pupils are equal, round, and reactive to light.  Neck: No JVD present. No tracheal deviation present. No thyromegaly present.  Cardiovascular: Normal rate, regular rhythm, normal heart sounds and intact distal pulses.  Exam reveals no gallop and no friction rub.   No murmur heard. Pulmonary/Chest: No respiratory distress. She has no wheezes. She has no rales. She exhibits no tenderness.  Abdominal: Soft. She exhibits no mass. There is tenderness. There is no guarding.  Genitourinary: Guaiac negative stool.  Lymphadenopathy:    She has cervical adenopathy.          Assessment & Plan:  Patient with a constellation of symptoms which include sore throat swollen glands loose stools. Treat symptomatically at present.

## 2011-10-08 NOTE — Patient Instructions (Signed)
I have given instructions for treatment of sore throat and diarrhea. Please take Imodium right ear he can take one up to 3 times a day as needed for loose stools. I have also given the bottle to use with a sore throatDiarrhea Infections caused by germs (bacterial) or a virus commonly cause diarrhea. Your caregiver has determined that with time, rest and fluids, the diarrhea should improve. In general, eat normally while drinking more water than usual. Although water may prevent dehydration, it does not contain salt and minerals (electrolytes). Broths, weak tea without caffeine and oral rehydration solutions (ORS) replace fluids and electrolytes. Small amounts of fluids should be taken frequently. Large amounts at one time may not be tolerated. Plain water may be harmful in infants and the elderly. Oral rehydrating solutions (ORS) are available at pharmacies and grocery stores. ORS replace water and important electrolytes in proper proportions. Sports drinks are not as effective as ORS and may be harmful due to sugars worsening diarrhea.  ORS is especially recommended for use in children with diarrhea. As a general guideline for children, replace any new fluid losses from diarrhea and/or vomiting with ORS as follows:   If your child weighs 22 pounds or under (10 kg or less), give 60-120 mL ( -  cup or 2 - 4 ounces) of ORS for each episode of diarrheal stool or vomiting episode.   If your child weighs more than 22 pounds (more than 10 kgs), give 120-240 mL ( - 1 cup or 4 - 8 ounces) of ORS for each diarrheal stool or episode of vomiting.   While correcting for dehydration, children should eat normally. However, foods high in sugar should be avoided because this may worsen diarrhea. Large amounts of carbonated soft drinks, juice, gelatin desserts and other highly sugared drinks should be avoided.   After correction of dehydration, other liquids that are appealing to the child may be added. Children  should drink small amounts of fluids frequently and fluids should be increased as tolerated. Children should drink enough fluids to keep urine clear or pale yellow.   Adults should eat normally while drinking more fluids than usual. Drink small amounts of fluids frequently and increase as tolerated. Drink enough fluids to keep urine clear or pale yellow. Broths, weak decaffeinated tea, lemon lime soft drinks (allowed to go flat) and ORS replace fluids and electrolytes.   Avoid:   Carbonated drinks.   Juice.   Extremely hot or cold fluids.   Caffeine drinks.   Fatty, greasy foods.   Alcohol.   Tobacco.   Too much intake of anything at one time.   Gelatin desserts.   Probiotics are active cultures of beneficial bacteria. They may lessen the amount and number of diarrheal stools in adults. Probiotics can be found in yogurt with active cultures and in supplements.   Wash hands well to avoid spreading bacteria and virus.   Anti-diarrheal medications are not recommended for infants and children.   Only take over-the-counter or prescription medicines for pain, discomfort or fever as directed by your caregiver. Do not give aspirin to children because it may cause Reye's Syndrome.   For adults, ask your caregiver if you should continue all prescribed and over-the-counter medicines.   If your caregiver has given you a follow-up appointment, it is very important to keep that appointment. Not keeping the appointment could result in a chronic or permanent injury, and disability. If there is any problem keeping the appointment, you must call back to  this facility for assistance.  SEEK IMMEDIATE MEDICAL CARE IF:   You or your child is unable to keep fluids down or other symptoms or problems become worse in spite of treatment.   Vomiting or diarrhea develops and becomes persistent.   There is vomiting of blood or bile (green material).   There is blood in the stool or the stools are black  and tarry.   There is no urine output in 6-8 hours or there is only a small amount of very dark urine.   Abdominal pain develops, increases or localizes.   You have a fever.   Your baby is older than 3 months with a rectal temperature of 102 F (38.9 C) or higher.   Your baby is 62 months old or younger with a rectal temperature of 100.4 F (38 C) or higher.   You or your child develops excessive weakness, dizziness, fainting or extreme thirst.   You or your child develops a rash, stiff neck, severe headache or become irritable or sleepy and difficult to awaken.  MAKE SURE YOU:   Understand these instructions.   Will watch your condition.   Will get help right away if you are not doing well or get worse.  Document Released: 07/18/2002 Document Revised: 04/09/2011 Document Reviewed: 06/04/2009 [facility] Patient Information 2012 [place], [place].Sore Throat Sore throats may be caused by bacteria and viruses. They may also be caused by:  Smoking.   Pollution.   Allergies.  If a sore throat is due to strep infection (a bacterial infection), you may need:  A throat swab.   A culture test to verify the strep infection.  You will need one of these:  An antibiotic shot.   Oral medicine for a full 10 days.  Strep infection is very contagious. A doctor should check any close contacts who have a sore throat or fever. A sore throat caused by a virus infection will usually last only 3-4 days. Antibiotics will not treat a viral sore throat.  Infectious mononucleosis (a viral disease), however, can cause a sore throat that lasts for up to 3 weeks. Mononucleosis can be diagnosed with blood tests. You must have been sick for at least 1 week in order for the test to give accurate results. HOME CARE INSTRUCTIONS   To treat a sore throat, take mild pain medicine.   Increase your fluids.   Eat a soft diet.   Do not smoke.   Gargling with warm water or salt water (1 tsp. salt in 8 oz.  water) can be helpful.   Try throat sprays or lozenges or sucking on hard candy to ease the symptoms.  Call your doctor if your sore throat lasts longer than 1 week.  SEEK IMMEDIATE MEDICAL CARE IF:  You have difficulty breathing.   You have increased swelling in the throat.   You have pain so severe that you are unable to swallow fluids or your saliva.   You have a severe headache, a high fever, vomiting, or a red rash.  Document Released: 09/04/2004 Document Revised: 04/09/2011 Document Reviewed: 07/15/2007 [facility] Patient Information 2012 [place], [place].

## 2011-10-09 ENCOUNTER — Other Ambulatory Visit: Payer: Self-pay | Admitting: Family Medicine

## 2011-10-09 MED ORDER — LOSARTAN POTASSIUM 50 MG PO TABS: 50.0000 mg | ORAL_TABLET | Freq: Every day | ORAL | Status: DC

## 2011-10-29 ENCOUNTER — Other Ambulatory Visit: Payer: Self-pay | Admitting: Emergency Medicine

## 2011-10-29 DIAGNOSIS — Z1231 Encounter for screening mammogram for malignant neoplasm of breast: Secondary | ICD-10-CM

## 2011-10-30 ENCOUNTER — Ambulatory Visit (INDEPENDENT_AMBULATORY_CARE_PROVIDER_SITE_OTHER): Payer: BC Managed Care – PPO | Admitting: Emergency Medicine

## 2011-10-30 VITALS — BP 155/87 | HR 86 | Temp 97.9°F | Resp 18 | Ht 60.0 in | Wt 227.0 lb

## 2011-10-30 DIAGNOSIS — J040 Acute laryngitis: Secondary | ICD-10-CM

## 2011-10-30 DIAGNOSIS — J029 Acute pharyngitis, unspecified: Secondary | ICD-10-CM

## 2011-10-30 DIAGNOSIS — R197 Diarrhea, unspecified: Secondary | ICD-10-CM

## 2011-10-30 DIAGNOSIS — D649 Anemia, unspecified: Secondary | ICD-10-CM

## 2011-10-30 LAB — POCT CBC
Lymph, poc: 2.1 (ref 0.6–3.4)
MCH, POC: 28.2 pg (ref 27–31.2)
MCHC: 31 g/dL — AB (ref 31.8–35.4)
MCV: 90.9 fL (ref 80–97)
MID (cbc): 0.5 (ref 0–0.9)
MPV: 8.7 fL (ref 0–99.8)
POC LYMPH PERCENT: 35 %L (ref 10–50)
POC MID %: 8.5 %M (ref 0–12)
Platelet Count, POC: 259 10*3/uL (ref 142–424)
RBC: 4.11 M/uL (ref 4.04–5.48)
WBC: 6 10*3/uL (ref 4.6–10.2)

## 2011-10-30 MED ORDER — CYANOCOBALAMIN 1000 MCG/ML IJ SOLN
1000.0000 ug | Freq: Once | INTRAMUSCULAR | Status: AC
  Administered 2011-10-30: 1000 ug via INTRAMUSCULAR

## 2011-10-30 NOTE — Progress Notes (Signed)
  Subjective:    Patient ID: Morgan Espinoza, female    DOB: 03/24/1951, 61 y.o.   MRN: [id number]  HPI returns today with 2 chief complaints. The patient has had laryngitis the past few days. She does not have any associated cough but her throat is slightly sore. She has no ear discomfort. Second problem is loose stools. She is having 2-3 loose watery stools per day. This is not associated with abdominal pain or cramping. There has been no associated blood.    Review of Systems patient under treatment for MS. She also needs her B12 shot today.     Objective:   Physical Exam  Constitutional: She appears well-nourished.  HENT:  Right Ear: External ear normal.  Left Ear: External ear normal.  Eyes: Pupils are equal, round, and reactive to light.  Neck: No JVD present. No tracheal deviation present. No thyromegaly present.  Cardiovascular: Normal rate, regular rhythm, normal heart sounds and intact distal pulses.  Exam reveals no gallop and no friction rub.   No murmur heard. Pulmonary/Chest: No respiratory distress. She has no wheezes. She has no rales. She exhibits no tenderness.  Lymphadenopathy:    She has no cervical adenopathy.          Assessment & Plan:  Strep test and Cbc unremarkable. Patient has been anemic but she always is. Did go ahead and give her her B12 shot today. She will take Imodium A-D for her diarrhea.

## 2011-10-30 NOTE — Patient Instructions (Signed)
Prescription given for Dukes mouth where she'll have Dukes half lidocaine to use as a gargle. She is also instructed to use some Imodium right ear as needed for diarrhea and given a handout for diarrhea treatment. Also given a handout regarding laryngitis.Laryngitis At the top of your windpipe is your voice box. It is the source of your voice. Inside your voice box are 2 bands of muscles called vocal cords. When you breathe, your vocal cords are relaxed and open so that air can get into the lungs. When you decide to say something, these cords come together and vibrate. The sound from these vibrations goes into your throat and comes out through your mouth as sound. Laryngitis is an inflammation of the vocal cords that causes hoarseness, cough, loss of voice, sore throat, and dry throat. Laryngitis can be temporary (acute) or long-term (chronic). Most cases of acute laryngitis improve with time.Chronic laryngitis lasts for more than 3 weeks. CAUSES Laryngitis can often be related to excessive smoking, talking, or yelling, as well as inhalation of toxic fumes and allergies. Acute laryngitis is usually caused by a viral infection, vocal strain, measles or mumps, or bacterial infections. Chronic laryngitis is usually caused by vocal cord strain, vocal cord injury, postnasal drip, growths on the vocal cords, or acid reflux. SYMPTOMS   Cough.   Sore throat.   Dry throat.  RISK FACTORS  Respiratory infections.   Exposure to irritating substances, such as cigarette smoke, excessive amounts of alcohol, stomach acids, and workplace chemicals.   Voice trauma, such as vocal cord injury from shouting or speaking too loud.  DIAGNOSIS  Your cargiver will perform a physical exam. During the physical exam, your caregiver will examine your throat. The most common sign of laryngitis is hoarseness. Laryngoscopy may be necessary to confirm the diagnosis of this condition. This procedure allows your caregiver to look  into the larynx. HOME CARE INSTRUCTIONS  Drink enough fluids to keep your urine clear or pale yellow.   Rest until you no longer have symptoms or as directed by your caregiver.   Breathe in moist air.   Take all medicine as directed by your caregiver.   Do not smoke.   Talk as little as possible (this includes whispering).   Write on paper instead of talking until your voice is back to normal.   Follow up with your caregiver if your condition has not improved after 10 days.  SEEK MEDICAL CARE IF:   You have trouble breathing.   You cough up blood.   You have persistent fever.   You have increasing pain.   You have difficulty swallowing.  MAKE SURE YOU:  Understand these instructions.   Will watch your condition.   Will get help right away if you are not doing well or get worse.  Document Released: 07/28/2005 Document Revised: 07/17/2011 Document Reviewed: 10/03/2010 [facility] Patient Information 2012 [place], [place].Diarrhea Infections caused by germs (bacterial) or a virus commonly cause diarrhea. Your caregiver has determined that with time, rest and fluids, the diarrhea should improve. In general, eat normally while drinking more water than usual. Although water may prevent dehydration, it does not contain salt and minerals (electrolytes). Broths, weak tea without caffeine and oral rehydration solutions (ORS) replace fluids and electrolytes. Small amounts of fluids should be taken frequently. Large amounts at one time may not be tolerated. Plain water may be harmful in infants and the elderly. Oral rehydrating solutions (ORS) are available at pharmacies and grocery stores. ORS replace water  and important electrolytes in proper proportions. Sports drinks are not as effective as ORS and may be harmful due to sugars worsening diarrhea.  ORS is especially recommended for use in children with diarrhea. As a general guideline for children, replace any new fluid losses from  diarrhea and/or vomiting with ORS as follows:   If your child weighs 22 pounds or under (10 kg or less), give 60-120 mL ( -  cup or 2 - 4 ounces) of ORS for each episode of diarrheal stool or vomiting episode.   If your child weighs more than 22 pounds (more than 10 kgs), give 120-240 mL ( - 1 cup or 4 - 8 ounces) of ORS for each diarrheal stool or episode of vomiting.   While correcting for dehydration, children should eat normally. However, foods high in sugar should be avoided because this may worsen diarrhea. Large amounts of carbonated soft drinks, juice, gelatin desserts and other highly sugared drinks should be avoided.   After correction of dehydration, other liquids that are appealing to the child may be added. Children should drink small amounts of fluids frequently and fluids should be increased as tolerated. Children should drink enough fluids to keep urine clear or pale yellow.   Adults should eat normally while drinking more fluids than usual. Drink small amounts of fluids frequently and increase as tolerated. Drink enough fluids to keep urine clear or pale yellow. Broths, weak decaffeinated tea, lemon lime soft drinks (allowed to go flat) and ORS replace fluids and electrolytes.   Avoid:   Carbonated drinks.   Juice.   Extremely hot or cold fluids.   Caffeine drinks.   Fatty, greasy foods.   Alcohol.   Tobacco.   Too much intake of anything at one time.   Gelatin desserts.   Probiotics are active cultures of beneficial bacteria. They may lessen the amount and number of diarrheal stools in adults. Probiotics can be found in yogurt with active cultures and in supplements.   Wash hands well to avoid spreading bacteria and virus.   Anti-diarrheal medications are not recommended for infants and children.   Only take over-the-counter or prescription medicines for pain, discomfort or fever as directed by your caregiver. Do not give aspirin to children because it may  cause Reye's Syndrome.   For adults, ask your caregiver if you should continue all prescribed and over-the-counter medicines.   If your caregiver has given you a follow-up appointment, it is very important to keep that appointment. Not keeping the appointment could result in a chronic or permanent injury, and disability. If there is any problem keeping the appointment, you must call back to this facility for assistance.  SEEK IMMEDIATE MEDICAL CARE IF:   You or your child is unable to keep fluids down or other symptoms or problems become worse in spite of treatment.   Vomiting or diarrhea develops and becomes persistent.   There is vomiting of blood or bile (green material).   There is blood in the stool or the stools are black and tarry.   There is no urine output in 6-8 hours or there is only a small amount of very dark urine.   Abdominal pain develops, increases or localizes.   You have a fever.   Your baby is older than 3 months with a rectal temperature of 102 F (38.9 C) or higher.   Your baby is 22 months old or younger with a rectal temperature of 100.4 F (38 C) or higher.  You or your child develops excessive weakness, dizziness, fainting or extreme thirst.   You or your child develops a rash, stiff neck, severe headache or become irritable or sleepy and difficult to awaken.  MAKE SURE YOU:   Understand these instructions.   Will watch your condition.   Will get help right away if you are not doing well or get worse.  Document Released: 07/18/2002 Document Revised: 07/17/2011 Document Reviewed: 06/04/2009 [facility] Patient Information 2012 [place].

## 2011-11-04 ENCOUNTER — Ambulatory Visit
Admission: RE | Admit: 2011-11-04 | Discharge: 2011-11-04 | Disposition: A | Discharge status: Home / Self Care | Payer: BC Managed Care – PPO | Source: Ambulatory Visit | Attending: Emergency Medicine | Admitting: Emergency Medicine

## 2011-11-04 DIAGNOSIS — Z1231 Encounter for screening mammogram for malignant neoplasm of breast: Secondary | ICD-10-CM

## 2011-11-26 ENCOUNTER — Ambulatory Visit (INDEPENDENT_AMBULATORY_CARE_PROVIDER_SITE_OTHER): Payer: BC Managed Care – PPO | Admitting: Emergency Medicine

## 2011-11-26 DIAGNOSIS — J309 Allergic rhinitis, unspecified: Secondary | ICD-10-CM

## 2011-11-26 DIAGNOSIS — R509 Fever, unspecified: Secondary | ICD-10-CM

## 2011-11-26 DIAGNOSIS — R51 Headache: Secondary | ICD-10-CM

## 2011-11-26 LAB — POCT RAPID STREP A (OFFICE): Rapid Strep A Screen: NEGATIVE

## 2011-11-26 MED ORDER — FLUTICASONE PROPIONATE 50 MCG/ACT NA SUSP: 2.0000 | Freq: Every day | NASAL | Status: DC

## 2011-11-26 NOTE — Progress Notes (Signed)
  Subjective:    Patient ID: Morgan Espinoza, female    DOB: [birth year], 61 y.o.   MRN: [id number]  HPI patient enters with onset yesterday of a headache. She has felt hot. But is not sure whether she has had a fever. She had bleeding from the left side of her nose. She has had a dry cough. She has also lost her voice.    Review of Systems patient does have a history of MS currently on treatment     Objective:   Physical Exam HEENT exam reveals alert female in no distress. There is a slight amount of rhinorrhea on the right. Her throat was clear. Neck was supple without adenopathy. Chest was clear to auscultation and percussion        Assessment & Plan:   I suspect her symptoms are secondary to allergic rhinitis the world-class treatment with Zyrtec or Claritin as well as Flonase. We'll check a flu test and strep test be sure we are not dealing with an infection.

## 2011-11-26 NOTE — Patient Instructions (Signed)
Allergic Rhinitis  Allergic rhinitis is when the mucous membranes in the nose respond to allergens. Allergens are particles in the air that cause your body to have an allergic reaction. This causes you to release allergic antibodies. Through a chain of events, these eventually cause you to release histamine into the blood stream (hence the use of antihistamines). Although meant to be protective to the body, it is this release that causes your discomfort, such as frequent sneezing, congestion and an itchy runny nose.    CAUSES    The pollen allergens may come from grasses, trees, and weeds. This is seasonal allergic rhinitis, or "hay fever." Other allergens cause year-round allergic rhinitis (perennial allergic rhinitis) such as house dust mite allergen, pet dander and mold spores.    SYMPTOMS     Nasal stuffiness (congestion).   Runny, itchy nose with sneezing and tearing of the eyes.   There is often an itching of the mouth, eyes and ears.  It cannot be cured, but it can be controlled with medications.  DIAGNOSIS    If you are unable to determine the offending allergen, skin or blood testing may find it.  TREATMENT     Avoid the allergen.   Medications and allergy shots (immunotherapy) can help.   Hay fever may often be treated with antihistamines in pill or nasal spray forms. Antihistamines block the effects of histamine. There are over-the-counter medicines that may help with nasal congestion and swelling around the eyes. Check with your caregiver before taking or giving this medicine.  If the treatment above does not work, there are many new medications your caregiver can prescribe. Stronger medications may be used if initial measures are ineffective. Desensitizing injections can be used if medications and avoidance fails. Desensitization is when a patient is given ongoing shots until the body becomes less sensitive to the allergen. Make sure you follow up with your caregiver if problems continue.  SEEK  MEDICAL CARE IF:     You develop fever (more than 100.5 F (38.1 C).   You develop a cough that does not stop easily (persistent).   You have shortness of breath.   You start wheezing.   Symptoms interfere with normal daily activities.  Document Released: 04/22/2001 Document Revised: 07/17/2011 Document Reviewed: 11/01/2008  [employer] Patient Information 2012 [employer].

## 2011-12-03 ENCOUNTER — Ambulatory Visit (INDEPENDENT_AMBULATORY_CARE_PROVIDER_SITE_OTHER): Payer: BC Managed Care – PPO | Admitting: Emergency Medicine

## 2011-12-03 ENCOUNTER — Ambulatory Visit: Payer: BC Managed Care – PPO

## 2011-12-03 VITALS — BP 131/78 | HR 82 | Temp 98.2°F | Resp 16 | Ht 60.0 in | Wt 224.6 lb

## 2011-12-03 DIAGNOSIS — E538 Deficiency of other specified B group vitamins: Secondary | ICD-10-CM

## 2011-12-03 DIAGNOSIS — D649 Anemia, unspecified: Secondary | ICD-10-CM

## 2011-12-03 DIAGNOSIS — M79673 Pain in unspecified foot: Secondary | ICD-10-CM

## 2011-12-03 DIAGNOSIS — M79609 Pain in unspecified limb: Secondary | ICD-10-CM

## 2011-12-03 DIAGNOSIS — M25569 Pain in unspecified knee: Secondary | ICD-10-CM

## 2011-12-03 MED ORDER — OXYCODONE-ACETAMINOPHEN 5-325 MG PO TABS: ORAL_TABLET | ORAL | Status: DC

## 2011-12-03 MED ORDER — CYANOCOBALAMIN 1000 MCG/ML IJ SOLN
1000.0000 ug | Freq: Once | INTRAMUSCULAR | Status: AC
  Administered 2011-12-03: 1000 ug via INTRAMUSCULAR

## 2011-12-03 NOTE — Progress Notes (Signed)
Spoke with patient and let her know what xray showed and told her to call if it continues to bother her.

## 2011-12-03 NOTE — Progress Notes (Signed)
  Subjective:    Patient ID: Morgan Espinoza, female    DOB: February 27, 1951, 61 y.o.   MRN: [id number]  HPI patient here with pain and swelling in her left heel and left knee. She has a lot of pain over the heel with difficulty walking. There is soreness in her left knee associated with walking.    Review of Systems she has a history and mass and B12 deficiency. She is also requesting a B12 shot the     Objective:   Physical Exam there is tenderness and swelling over the medial and lateral joint space. There is pain with flexion and extension of the knee. There is exquisite tenderness over the medial portion of the left heel. Patient is very flat-footed.  [facility] reading (PRIMARY) by  Dr.Verlaine Embry x-rays of the knee reveal narrowing medial joint space no other abnormalities. X-rays of the heel reveal a large Achilles spur no heel spur noted.        Assessment & Plan:  We'll go ahead and give B12 shot today. Check films of the left knee and left heel. Patient treated with exercises, all she can take a half an oxycodone at night is needed for pain

## 2011-12-03 NOTE — Patient Instructions (Signed)
Plantar Fasciitis (Heel Spur Syndrome) with Rehab The plantar fascia is a fibrous, ligament-like, soft-tissue structure that spans the bottom of the foot. Plantar fasciitis is a condition that causes pain in the foot due to inflammation of the tissue. SYMPTOMS   Pain and tenderness on the underneath side of the foot.   Pain that worsens with standing or walking.  CAUSES  Plantar fasciitis is caused by irritation and injury to the plantar fascia on the underneath side of the foot. Common mechanisms of injury include:  Direct trauma to bottom of the foot.   Damage to a small nerve that runs under the foot where the main fascia attaches to the heel bone.   Stress placed on the plantar fascia due to bone spurs.  RISK INCREASES WITH:   Activities that place stress on the plantar fascia (running, jumping, pivoting, or cutting).   Poor strength and flexibility.   Improperly fitted shoes.   Tight calf muscles.   Flat feet.   Failure to warm-up properly before activity.   Obesity.  PREVENTION  Warm up and stretch properly before activity.   Allow for adequate recovery between workouts.   Maintain physical fitness:   Strength, flexibility, and endurance.   Cardiovascular fitness.   Maintain a health body weight.   Avoid stress on the plantar fascia.   Wear properly fitted shoes, including arch supports for individuals who have flat feet.  PROGNOSIS  If treated properly, then the symptoms of plantar fasciitis usually resolve without surgery. However, occasionally surgery is necessary. RELATED COMPLICATIONS   Recurrent symptoms that may result in a chronic condition.   Problems of the lower back that are caused by compensating for the injury, such as limping.   Pain or weakness of the foot during push-off following surgery.   Chronic inflammation, scarring, and partial or complete fascia tear, occurring more often from repeated injections.  TREATMENT  Treatment  initially involves the use of ice and medication to help reduce pain and inflammation. The use of strengthening and stretching exercises may help reduce pain with activity, especially stretches of the Achilles tendon. These exercises may be performed at home or with a therapist. Your caregiver may recommend that you use heel cups of arch supports to help reduce stress on the plantar fascia. Occasionally, corticosteroid injections are given to reduce inflammation. If symptoms persist for greater than 6 months despite non-surgical (conservative), then surgery may be recommended.  MEDICATION   If pain medication is necessary, then nonsteroidal anti-inflammatory medications, such as aspirin and ibuprofen, or other minor pain relievers, such as acetaminophen, are often recommended.   Do not take pain medication within 7 days before surgery.   Prescription pain relievers may be given if deemed necessary by your caregiver. Use only as directed and only as much as you need.   Corticosteroid injections may be given by your caregiver. These injections should be reserved for the most serious cases, because they may only be given a certain number of times.  HEAT AND COLD  Cold treatment (icing) relieves pain and reduces inflammation. Cold treatment should be applied for 10 to 15 minutes every 2 to 3 hours for inflammation and pain and immediately after any activity that aggravates your symptoms. Use ice packs or massage the area with a piece of ice (ice massage).   Heat treatment may be used prior to performing the stretching and strengthening activities prescribed by your caregiver, physical therapist, or athletic trainer. Use a heat pack or soak the   injury in warm water.  SEEK IMMEDIATE MEDICAL CARE IF:  Treatment seems to offer no benefit, or the condition worsens.   Any medications produce adverse side effects.  EXERCISES RANGE OF MOTION (ROM) AND STRETCHING EXERCISES - Plantar Fasciitis (Heel Spur  Syndrome) These exercises may help you when beginning to rehabilitate your injury. Your symptoms may resolve with or without further involvement from your physician, physical therapist or athletic trainer. While completing these exercises, remember:   Restoring tissue flexibility helps normal motion to return to the joints. This allows healthier, less painful movement and activity.   An effective stretch should be held for at least 30 seconds.   A stretch should never be painful. You should only feel a gentle lengthening or release in the stretched tissue.  RANGE OF MOTION - Toe Extension, Flexion  Sit with your right / left leg crossed over your opposite knee.   Grasp your toes and gently pull them back toward the top of your foot. You should feel a stretch on the bottom of your toes and/or foot.   Hold this stretch for __________ seconds.   Now, gently pull your toes toward the bottom of your foot. You should feel a stretch on the top of your toes and or foot.   Hold this stretch for __________ seconds.  Repeat __________ times. Complete this stretch __________ times per day.  RANGE OF MOTION - Ankle Dorsiflexion, Active Assisted  Remove shoes and sit on a chair that is preferably not on a carpeted surface.   Place right / left foot under knee. Extend your opposite leg for support.   Keeping your heel down, slide your right / left foot back toward the chair until you feel a stretch at your ankle or calf. If you do not feel a stretch, slide your bottom forward to the edge of the chair, while still keeping your heel down.   Hold this stretch for __________ seconds.  Repeat __________ times. Complete this stretch __________ times per day.  STRETCH - Gastroc, Standing  Place hands on wall.   Extend right / left leg, keeping the front knee somewhat bent.   Slightly point your toes inward on your back foot.   Keeping your right / left heel on the floor and your knee straight, shift  your weight toward the wall, not allowing your back to arch.   You should feel a gentle stretch in the right / left calf. Hold this position for __________ seconds.  Repeat __________ times. Complete this stretch __________ times per day. STRETCH - Soleus, Standing  Place hands on wall.   Extend right / left leg, keeping the other knee somewhat bent.   Slightly point your toes inward on your back foot.   Keep your right / left heel on the floor, bend your back knee, and slightly shift your weight over the back leg so that you feel a gentle stretch deep in your back calf.   Hold this position for __________ seconds.  Repeat __________ times. Complete this stretch __________ times per day. STRETCH - Gastrocsoleus, Standing  Note: This exercise can place a lot of stress on your foot and ankle. Please complete this exercise only if specifically instructed by your caregiver.   Place the ball of your right / left foot on a step, keeping your other foot firmly on the same step.   Hold on to the wall or a rail for balance.   Slowly lift your other foot, allowing your   body weight to press your heel down over the edge of the step.   You should feel a stretch in your right / left calf.   Hold this position for __________ seconds.   Repeat this exercise with a slight bend in your right / left knee.  Repeat __________ times. Complete this stretch __________ times per day.  STRENGTHENING EXERCISES - Plantar Fasciitis (Heel Spur Syndrome)  These exercises may help you when beginning to rehabilitate your injury. They may resolve your symptoms with or without further involvement from your physician, physical therapist or athletic trainer. While completing these exercises, remember:   Muscles can gain both the endurance and the strength needed for everyday activities through controlled exercises.   Complete these exercises as instructed by your physician, physical therapist or athletic trainer.  Progress the resistance and repetitions only as guided.  STRENGTH - Towel Curls  Sit in a chair positioned on a non-carpeted surface.   Place your foot on a towel, keeping your heel on the floor.   Pull the towel toward your heel by only curling your toes. Keep your heel on the floor.   If instructed by your physician, physical therapist or athletic trainer, add ____________________ at the end of the towel.  Repeat __________ times. Complete this exercise __________ times per day. STRENGTH - Ankle Inversion  Secure one end of a rubber exercise band/tubing to a fixed object (table, pole). Loop the other end around your foot just before your toes.   Place your fists between your knees. This will focus your strengthening at your ankle.   Slowly, pull your big toe up and in, making sure the band/tubing is positioned to resist the entire motion.   Hold this position for __________ seconds.   Have your muscles resist the band/tubing as it slowly pulls your foot back to the starting position.  Repeat __________ times. Complete this exercises __________ times per day.  Document Released: 07/28/2005 Document Revised: 07/17/2011 Document Reviewed: 11/09/2008 [employer] Patient Information 2012 [employer]. 

## 2012-01-08 ENCOUNTER — Ambulatory Visit (INDEPENDENT_AMBULATORY_CARE_PROVIDER_SITE_OTHER): Payer: BC Managed Care – PPO | Admitting: Physician Assistant

## 2012-01-08 DIAGNOSIS — D649 Anemia, unspecified: Secondary | ICD-10-CM

## 2012-01-08 MED ORDER — CYANOCOBALAMIN 1000 MCG/ML IJ SOLN
1000.0000 ug | Freq: Once | INTRAMUSCULAR | Status: AC
  Administered 2012-01-08: 1000 ug via INTRAMUSCULAR

## 2012-01-08 NOTE — Progress Notes (Signed)
   Patient ID: Morgan Espinoza MRN: [id number], DOB: Nov 04, 1950, 61 y.o. Date of Encounter: 01/08/2012, 12:22 PM  Primary Physician: Lucilla Edin, MD, MD  Chief Complaint: Here for B-12 injection  61 y.o. year old female here for B12 injection. On time. Last injection was 12/03/11. Ok to give B12 injection. Next injection due on 02/08/12. This was a nursing only encounter. No provider/patient encounter occurred today.   Signed, Eula Listen, PA-C 01/08/2012 12:22 PM

## 2012-01-20 ENCOUNTER — Ambulatory Visit (INDEPENDENT_AMBULATORY_CARE_PROVIDER_SITE_OTHER): Payer: BC Managed Care – PPO | Admitting: Physician Assistant

## 2012-01-20 VITALS — BP 120/74 | HR 83 | Temp 97.7°F | Resp 16 | Ht 61.0 in | Wt 221.4 lb

## 2012-01-20 DIAGNOSIS — R0789 Other chest pain: Secondary | ICD-10-CM

## 2012-01-20 DIAGNOSIS — R071 Chest pain on breathing: Secondary | ICD-10-CM

## 2012-01-20 DIAGNOSIS — J309 Allergic rhinitis, unspecified: Secondary | ICD-10-CM

## 2012-01-20 MED ORDER — MELOXICAM 15 MG PO TABS: 7.5000 mg | ORAL_TABLET | Freq: Every day | ORAL | Status: DC

## 2012-01-20 NOTE — Progress Notes (Signed)
  Subjective:    Patient ID: Morgan Espinoza, female    DOB: Mar 13, 1951, 61 y.o.   MRN: [id number]  HPI Patient presents with several weeks of intermittent pain in the left chest with cough.  She has allergies and asthma, so the cough is typical for her, and not any worse than usual.  Then a few days ago, she did some packing up of her home in preparation for some duct-work.  It was quite a big job, as she is a self-described pack-rat.  Yesterday she noticed the pain in the chest was worse, tender with pressure and moving her left arm.  No SOB.  No increased allergy symptoms.  She has laryngitis, but denies nasal congestion.  She has post-nasal drainage.  She is not using the Flonase, but isn't sure why. No pressure sensation in the chest, no nausea, no neck, jaw or shoulder pain.  Review of Systems As above.    Objective:   Physical Exam Vital signs noted. Well-developed, well nourished BF who is awake, alert and oriented, in NAD. HEENT: [place]/AT, PERRL, EOMI.  Sclera and conjunctiva are clear.  EAC are patent, TMs are normal in appearance. Nasal mucosa is pink and moist. OP is clear (partial upper plate). Neck: supple, non-tender, no lymphadenopathy, thyromegaly. Heart: RRR, no murmur Lungs: CTA Musculoskeletal:  Left chest wall is tender on palpation anteriorly and laterally.  Minimal tenderness on the left posterior ribs. Skin: warm and dry without rash.     Assessment & Plan:   1. Chest wall pain  meloxicam (MOBIC) 15 MG tablet  2. AR (allergic rhinitis)  Restart Flonase, 2 sprays q nostril QD   Patient Instructions  Restart the Flonase (fluticasone, nasal spray), 2 sprays in each nostril once each day. The Mobic (meloxicam) is for the pain the the muscles of the chest wall. If it continues, or worsens, return for re-evaluation.

## 2012-01-20 NOTE — Patient Instructions (Signed)
Restart the Flonase (fluticasone, nasal spray), 2 sprays in each nostril once each day. The Mobic (meloxicam) is for the pain the the muscles of the chest wall. If it continues, or worsens, return for re-evaluation.

## 2012-01-21 ENCOUNTER — Ambulatory Visit (INDEPENDENT_AMBULATORY_CARE_PROVIDER_SITE_OTHER): Payer: BC Managed Care – PPO | Admitting: Family Medicine

## 2012-01-21 ENCOUNTER — Ambulatory Visit: Payer: BC Managed Care – PPO

## 2012-01-21 ENCOUNTER — Encounter: Payer: Self-pay | Admitting: Family Medicine

## 2012-01-21 VITALS — BP 157/83 | HR 91 | Temp 97.6°F | Resp 16 | Wt 223.0 lb

## 2012-01-21 DIAGNOSIS — M79674 Pain in right toe(s): Secondary | ICD-10-CM

## 2012-01-21 DIAGNOSIS — M79609 Pain in unspecified limb: Secondary | ICD-10-CM

## 2012-01-21 NOTE — Progress Notes (Signed)
Is a 61 year old woman who had a close encounter with her recliner chair today, stubbing her right foot and developing right fourth toe pain subsequently. And was seen yesterday as well for left chest pain which was thought to be muscular in nature. This is much better today.  Objective: No acute distress, cheerful obese woman. Mildly antalgic gait Right foot: No swelling, nontender, full range of motion with good pedal pulse [facility] reading (PRIMARY) by  Dr. Milus Glazier right toe films:  negative  Assessment: . Stumped toe, right without fracture  Plan: Patient reassured

## 2012-02-04 ENCOUNTER — Ambulatory Visit (INDEPENDENT_AMBULATORY_CARE_PROVIDER_SITE_OTHER): Payer: BC Managed Care – PPO | Admitting: Physician Assistant

## 2012-02-04 VITALS — BP 125/77 | HR 73 | Temp 98.2°F | Resp 18 | Ht 61.0 in | Wt 221.2 lb

## 2012-02-04 DIAGNOSIS — D510 Vitamin B12 deficiency anemia due to intrinsic factor deficiency: Secondary | ICD-10-CM

## 2012-02-04 MED ORDER — CYANOCOBALAMIN 1000 MCG/ML IJ SOLN
1000.0000 ug | INTRAMUSCULAR | Status: DC
  Administered 2012-02-04 – 2013-09-29 (×5): 1000 ug via INTRAMUSCULAR

## 2012-02-04 NOTE — Progress Notes (Signed)
  Subjective:    Patient ID: Morgan Espinoza, female    DOB: March 16, 1951, 61 y.o.   MRN: [id number]  HPI Not seen by provider. 4 days early for B12 injection    Review of Systems not done     Objective:   Physical Exam Not done       Assessment & Plan:  Put standing order in the chart for B12 injections q 30 days

## 2012-03-08 ENCOUNTER — Ambulatory Visit (INDEPENDENT_AMBULATORY_CARE_PROVIDER_SITE_OTHER): Payer: BC Managed Care – PPO | Admitting: Family Medicine

## 2012-03-08 ENCOUNTER — Ambulatory Visit: Payer: BC Managed Care – PPO

## 2012-03-08 VITALS — BP 146/78 | HR 81 | Temp 98.0°F | Resp 16 | Ht 62.0 in | Wt 220.0 lb

## 2012-03-08 DIAGNOSIS — M79671 Pain in right foot: Secondary | ICD-10-CM

## 2012-03-08 DIAGNOSIS — J189 Pneumonia, unspecified organism: Secondary | ICD-10-CM

## 2012-03-08 DIAGNOSIS — E538 Deficiency of other specified B group vitamins: Secondary | ICD-10-CM

## 2012-03-08 DIAGNOSIS — R0789 Other chest pain: Secondary | ICD-10-CM

## 2012-03-08 MED ORDER — CYANOCOBALAMIN 1000 MCG/ML IJ SOLN
1000.0000 ug | Freq: Once | INTRAMUSCULAR | Status: AC
  Administered 2012-03-08: 1000 ug via INTRAMUSCULAR

## 2012-03-08 MED ORDER — MELOXICAM 15 MG PO TABS: 7.5000 mg | ORAL_TABLET | Freq: Every day | ORAL | Status: DC

## 2012-03-08 NOTE — Progress Notes (Signed)
61 yo with about a week of right foot, ankle and knee pain without fall or trauma.  Pain in foot is along the right lateral metatarsal. Patient has MS, followed by Dr. Sandria Manly Patient last had B12 shot one month ago  O:  NAD Right foot:  Tender metatarsal V.  No STS or rash Right ankle:  Nl inspection, ROM, palpation Right knee:  No effusion, nontender, FROM [facility] reading (PRIMARY) by  Dr. Milus Glazier  Right foot:  negatory  A:  b12 deficiency  Foot strain  P.

## 2012-03-11 ENCOUNTER — Telehealth: Payer: Self-pay

## 2012-03-11 NOTE — Telephone Encounter (Signed)
Pt is requesting permission to take another pain pill at 2:30 in the afternoon, when the pain returns.  Please call [phone]

## 2012-03-12 NOTE — Telephone Encounter (Signed)
She called me back and was advised, she will take 1/2 in the am and 1/2 in the pm.

## 2012-03-12 NOTE — Telephone Encounter (Signed)
I have called did not get answer, called back line busy will try again later

## 2012-03-12 NOTE — Telephone Encounter (Signed)
If he is taking 1/2 of the Mobic, it is ok to take the other 1/2 in the afternoon. If he is taking the whole dose, then he will have to take tylenol in the afternoon.

## 2012-03-15 ENCOUNTER — Encounter: Payer: Self-pay | Admitting: Emergency Medicine

## 2012-03-15 ENCOUNTER — Other Ambulatory Visit: Payer: Self-pay | Admitting: Family Medicine

## 2012-03-15 MED ORDER — AMLODIPINE BESYLATE 10 MG PO TABS: 10.0000 mg | ORAL_TABLET | Freq: Every day | ORAL | Status: DC

## 2012-03-19 ENCOUNTER — Ambulatory Visit (INDEPENDENT_AMBULATORY_CARE_PROVIDER_SITE_OTHER): Payer: BC Managed Care – PPO | Admitting: Emergency Medicine

## 2012-03-19 VITALS — BP 153/85 | HR 75 | Temp 98.2°F | Resp 16 | Ht 60.8 in | Wt 220.0 lb

## 2012-03-19 DIAGNOSIS — M79609 Pain in unspecified limb: Secondary | ICD-10-CM

## 2012-03-19 DIAGNOSIS — M79673 Pain in unspecified foot: Secondary | ICD-10-CM

## 2012-03-19 MED ORDER — HYDROCODONE-ACETAMINOPHEN 5-325 MG PO TABS: 1.0000 | ORAL_TABLET | Freq: Four times a day (QID) | ORAL | Status: AC | PRN

## 2012-03-19 NOTE — Progress Notes (Signed)
  Subjective:    Patient ID: Morgan Espinoza, female    DOB: 18-Dec-1950, 61 y.o.   MRN: [id number]  HPI patient in for recheck. She continues to have pain in the lateral portion of her foot. She has a lot of pain with walking.    Review of Systems     Objective:   Physical Exam there is tenderness over the foot approximately 1 inch distal to the head of the fibula. There is no swelling noted        Assessment & Plan:  We'll add hydrocodone for severe pain . She is to continue Mobic to

## 2012-04-06 ENCOUNTER — Ambulatory Visit (INDEPENDENT_AMBULATORY_CARE_PROVIDER_SITE_OTHER): Payer: BC Managed Care – PPO | Admitting: Internal Medicine

## 2012-04-06 VITALS — BP 140/82 | HR 88 | Temp 98.6°F | Resp 16 | Ht 60.75 in | Wt 218.2 lb

## 2012-04-06 DIAGNOSIS — J029 Acute pharyngitis, unspecified: Secondary | ICD-10-CM

## 2012-04-06 DIAGNOSIS — G35 Multiple sclerosis: Secondary | ICD-10-CM

## 2012-04-06 DIAGNOSIS — H1030 Unspecified acute conjunctivitis, unspecified eye: Secondary | ICD-10-CM

## 2012-04-06 DIAGNOSIS — H109 Unspecified conjunctivitis: Secondary | ICD-10-CM

## 2012-04-06 DIAGNOSIS — E538 Deficiency of other specified B group vitamins: Secondary | ICD-10-CM

## 2012-04-06 LAB — POCT CBC
Lymph, poc: 3.6 — AB (ref 0.6–3.4)
MCHC: 30.6 g/dL — AB (ref 31.8–35.4)
MID (cbc): 0.6 (ref 0–0.9)
MPV: 9.9 fL (ref 0–99.8)
POC Granulocyte: 3.8 (ref 2–6.9)
POC LYMPH PERCENT: 44.8 %L (ref 10–50)
POC MID %: 7.9 %M (ref 0–12)
Platelet Count, POC: 176 10*3/uL (ref 142–424)
RDW, POC: 13.3 %

## 2012-04-06 MED ORDER — CYANOCOBALAMIN 1000 MCG/ML IJ SOLN
1000.0000 ug | Freq: Once | INTRAMUSCULAR | Status: AC
  Administered 2012-04-06: 1000 ug via INTRAMUSCULAR

## 2012-04-06 MED ORDER — CIPROFLOXACIN HCL 0.3 % OP SOLN: 1.0000 [drp] | OPHTHALMIC | Status: AC

## 2012-04-06 MED ORDER — AMOXICILLIN 500 MG PO CAPS: 500.0000 mg | ORAL_CAPSULE | Freq: Three times a day (TID) | ORAL | Status: AC

## 2012-04-06 NOTE — Progress Notes (Signed)
  Subjective:    Patient ID: Morgan Espinoza, female    DOB: [birth year], 61 y.o.   MRN: [id number]  HPI sorethroat Several days Moderate on severity Chills No diff swallowing Has multiple sclerosis Also c/o discharge form both her eyes and has a mild headache Needs her b12 shot today.     Review of Systems  Constitutional: Positive for chills, activity change, appetite change and fatigue.  HENT: Positive for sore throat.   Eyes: Positive for discharge and itching.  Respiratory: Negative.   Cardiovascular: Negative.   Gastrointestinal: Negative.   Genitourinary: Negative.   Musculoskeletal: Negative.   Skin: Negative.   Neurological: Negative.   Hematological: Negative.   Psychiatric/Behavioral: Negative.   All other systems reviewed and are negative.       Objective:   Physical Exam  Nursing note and vitals reviewed. Constitutional: She is oriented to person, place, and time. She appears well-developed and well-nourished.  HENT:  Head: Normocephalic and atraumatic.  Right Ear: External ear normal.       Erythema of pharynx no exudate. Bilateral conjunctival injection  Cardiovascular: Normal rate, regular rhythm and normal heart sounds.   Pulmonary/Chest: Effort normal and breath sounds normal.  Abdominal: Soft. Bowel sounds are normal.  Musculoskeletal: Normal range of motion.  Neurological: She is alert and oriented to person, place, and time.  Skin: Skin is warm and dry.  Psychiatric: She has a normal mood and affect. Her behavior is normal. Judgment and thought content normal.    Results for orders placed in visit on 04/06/12  POCT RAPID STREP A (OFFICE)      Component Value Range   Rapid Strep A Screen Negative  Negative  POCT CBC      Component Value Range   WBC 8.1  4.6 - 10.2 K/uL   Lymph, poc 3.6 (*) 0.6 - 3.4   POC LYMPH PERCENT 44.8  10 - 50 %L   MID (cbc) 0.6  0 - 0.9   POC MID % 7.9  0 - 12 %M   POC Granulocyte 3.8  2 - 6.9   Granulocyte percent  47.3  37 - 80 %G   RBC 4.16  4.04 - 5.48 M/uL   Hemoglobin 11.9 (*) 12.2 - 16.2 g/dL   HCT, POC 40.9  81.1 - 47.9 %   MCV 93.4  80 - 97 fL   MCH, POC 28.6  27 - 31.2 pg   MCHC 30.6 (*) 31.8 - 35.4 g/dL   RDW, POC 91.4     Platelet Count, POC 176  142 - 424 K/uL   MPV 9.9  0 - 99.8 fL    Wbc is normal . Will rx with antibitoics and eyedrops.    Assessment & Plan:  Pharyngitis Amoxil as directed Conjunctivitis ciloxin as directed Cephalgia Tylenol b12 deficiency b12 injection today.

## 2012-04-06 NOTE — Patient Instructions (Signed)
Take med's as directed. Use eyedrops as directed. If your symptoms worsen return to the er.

## 2012-04-11 ENCOUNTER — Ambulatory Visit (INDEPENDENT_AMBULATORY_CARE_PROVIDER_SITE_OTHER): Payer: BC Managed Care – PPO | Admitting: Emergency Medicine

## 2012-04-11 ENCOUNTER — Ambulatory Visit (HOSPITAL_COMMUNITY)
Admission: RE | Admit: 2012-04-11 | Discharge: 2012-04-11 | Disposition: A | Discharge status: Home / Self Care | Payer: BC Managed Care – PPO | Source: Ambulatory Visit | Attending: Emergency Medicine | Admitting: Emergency Medicine

## 2012-04-11 VITALS — BP 140/88 | HR 84 | Temp 97.8°F | Resp 18 | Ht 61.25 in | Wt 221.0 lb

## 2012-04-11 DIAGNOSIS — G35 Multiple sclerosis: Secondary | ICD-10-CM

## 2012-04-11 DIAGNOSIS — R51 Headache: Secondary | ICD-10-CM

## 2012-04-11 MED ORDER — ACETAMINOPHEN 325 MG PO TABS: 1000.0000 mg | ORAL_TABLET | Freq: Once | ORAL | Status: DC

## 2012-04-11 NOTE — Progress Notes (Signed)
  Subjective:    Patient ID: Morgan Espinoza, female    DOB: 12/19/1950, 61 y.o.   MRN: [id number]  HPI patient was seen here 3 days ago diagnosed with sinus infection placed on amoxicillin. Last night she has severe right supraorbital headache for which she took some, with minimal relief. She subsequently has taken her MS medication by injection as morning she still has a fairly significant residual right supraorbital headache which is unusual for her. She has not had any vomiting. She denies any weakness of or numbness of her arms or legs any different from her baseline MS    Review of Systems     Objective:   Physical Exam  Constitutional:       Patient looks uncomfortable but does not appear toxic  HENT:  Head: Normocephalic.  Neck: No thyromegaly present.  Cardiovascular: Normal rate and regular rhythm.   Neurological:       Pupils are equal and reactive to light. The disc margins were flat. Cranial nerves are intact. There is no motor weakness noted.          Assessment & Plan:

## 2012-04-18 ENCOUNTER — Ambulatory Visit (INDEPENDENT_AMBULATORY_CARE_PROVIDER_SITE_OTHER): Payer: BC Managed Care – PPO | Admitting: Emergency Medicine

## 2012-04-18 ENCOUNTER — Ambulatory Visit: Payer: BC Managed Care – PPO

## 2012-04-18 VITALS — BP 146/84 | HR 68 | Temp 97.6°F | Resp 18 | Ht 61.0 in | Wt 222.2 lb

## 2012-04-18 DIAGNOSIS — Z23 Encounter for immunization: Secondary | ICD-10-CM

## 2012-04-18 DIAGNOSIS — R0789 Other chest pain: Secondary | ICD-10-CM

## 2012-04-18 DIAGNOSIS — M79671 Pain in right foot: Secondary | ICD-10-CM

## 2012-04-18 DIAGNOSIS — R071 Chest pain on breathing: Secondary | ICD-10-CM

## 2012-04-18 DIAGNOSIS — M79609 Pain in unspecified limb: Secondary | ICD-10-CM

## 2012-04-18 MED ORDER — METHOCARBAMOL 750 MG PO TABS: 750.0000 mg | ORAL_TABLET | Freq: Three times a day (TID) | ORAL | Status: AC

## 2012-04-18 MED ORDER — MELOXICAM 15 MG PO TABS: 7.5000 mg | ORAL_TABLET | Freq: Every day | ORAL | Status: DC

## 2012-04-18 NOTE — Patient Instructions (Addendum)
Chest Wall Pain Chest wall pain is pain in or around the bones and muscles of your chest. It may take up to 6 weeks to get better. It may take longer if you must stay physically active in your work and activities.  CAUSES  Chest wall pain may happen on its own. However, it may be caused by:  A viral illness like the flu.   Injury.   Coughing.   Exercise.   Arthritis.   Fibromyalgia.   Shingles.  HOME CARE INSTRUCTIONS   Avoid overtiring physical activity. Try not to strain or perform activities that cause pain. This includes any activities using your chest or your abdominal and side muscles, especially if heavy weights are used.   Put ice on the sore area.   Put ice in a plastic bag.   Place a towel between your skin and the bag.   Leave the ice on for 15 to 20 minutes per hour while awake for the first 2 days.   Only take over-the-counter or prescription medicines for pain, discomfort, or fever as directed by your caregiver.  SEEK IMMEDIATE MEDICAL CARE IF:   Your pain increases, or you are very uncomfortable.   You have a fever.   Your chest pain becomes worse.   You have new, unexplained symptoms.   You have nausea or vomiting.   You feel sweaty or lightheaded.   You have a cough with phlegm (sputum), or you cough up blood.  MAKE SURE YOU:   Understand these instructions.   Will watch your condition.   Will get help right away if you are not doing well or get worse.  Document Released: 07/28/2005 Document Revised: 07/17/2011 Document Reviewed: 03/24/2011 [employer] Patient Information 2012 [employer]. 

## 2012-04-18 NOTE — Progress Notes (Signed)
  Subjective:    Patient ID: Morgan Espinoza, female    DOB: June 30, 1951, 61 y.o.   MRN: [id number]  HPI patient was in her usual state of health until yesterday when while straining to have a bowel movement she developed severe pain which extended down the inside of her left arm and also left side of her breast and chest. She has not had any shortness of breath with this. She does have pain when she tries to abduct her left arm. She has no numbness in the arm. She has no significant chest pain.    Review of Systems     Objective:   Physical Exam there is tenderness over the left shoulder there is pain with abduction of the left arm there is tenderness over the left pectoralis muscle. Her chest was clear to auscultation and percussion her cardiac exam is unremarkable.  [facility] reading (PRIMARY) by  Dr.Aylla Huffine borderline cardiomegaly no evidence of pneumothorax. I was called by the radiologist who felt there was a patchy infiltrate in the left upper lobe which is felt to have followup. We'll arrange a CT chest to        Assessment & Plan:  Patient presents with chest wall pain. I did refill her Mobic she can take as needed and a prescription for Robaxin 750 as needed.

## 2012-04-20 ENCOUNTER — Telehealth: Payer: Self-pay | Admitting: Radiology

## 2012-04-20 NOTE — Telephone Encounter (Signed)
Morgan Espinoza, the CT chest has been approved for the patient to be done at [facility] the approval # is [id number]

## 2012-04-26 ENCOUNTER — Other Ambulatory Visit: Payer: Self-pay | Admitting: Physician Assistant

## 2012-04-26 ENCOUNTER — Ambulatory Visit
Admission: RE | Admit: 2012-04-26 | Discharge: 2012-04-26 | Disposition: A | Discharge status: Home / Self Care | Payer: BC Managed Care – PPO | Source: Ambulatory Visit | Attending: Emergency Medicine | Admitting: Emergency Medicine

## 2012-04-26 DIAGNOSIS — R0789 Other chest pain: Secondary | ICD-10-CM

## 2012-04-26 MED ORDER — IOHEXOL 300 MG/ML  SOLN
75.0000 mL | Freq: Once | INTRAMUSCULAR | Status: AC | PRN
  Administered 2012-04-26: 75 mL via INTRAVENOUS

## 2012-04-28 ENCOUNTER — Other Ambulatory Visit: Payer: Self-pay | Admitting: Emergency Medicine

## 2012-04-28 ENCOUNTER — Ambulatory Visit (INDEPENDENT_AMBULATORY_CARE_PROVIDER_SITE_OTHER): Payer: BC Managed Care – PPO | Admitting: Emergency Medicine

## 2012-04-28 VITALS — BP 147/81 | HR 77 | Temp 98.2°F | Resp 18 | Wt 223.0 lb

## 2012-04-28 DIAGNOSIS — D649 Anemia, unspecified: Secondary | ICD-10-CM

## 2012-04-28 DIAGNOSIS — D696 Thrombocytopenia, unspecified: Secondary | ICD-10-CM

## 2012-04-28 DIAGNOSIS — R9389 Abnormal findings on diagnostic imaging of other specified body structures: Secondary | ICD-10-CM

## 2012-04-28 DIAGNOSIS — M899 Disorder of bone, unspecified: Secondary | ICD-10-CM

## 2012-04-28 LAB — POCT CBC
HCT, POC: 41 % (ref 37.7–47.9)
MCH, POC: 28.3 pg (ref 27–31.2)
MCV: 94.5 fL (ref 80–97)
MID (cbc): 0.4 (ref 0–0.9)
POC LYMPH PERCENT: 39 %L (ref 10–50)
Platelet Count, POC: 235 10*3/uL (ref 142–424)
RBC: 4.34 M/uL (ref 4.04–5.48)
RDW, POC: 13.6 %
WBC: 5.8 10*3/uL (ref 4.6–10.2)

## 2012-04-28 NOTE — Progress Notes (Signed)
  Subjective:    Patient ID: Morgan Espinoza, female    DOB: 01-Mar-1951, 61 y.o.   MRN: [id number]  HPI patient here with a left chest wall lesion. She was seen with a left chest wall strain however the chest x-ray was abnormal and a CT was ordered . The CT disclosed a mass in the left second rib approximately 2-1/2 cm which was consistent with metastatic disease versus a plasmacytoma    Review of Systems     Objective:   Physical Exam there is no tenderness over the left second rib breath sounds are symmetrical the abdomen is soft  Results for orders placed in visit on 04/28/12  POCT CBC      Component Value Range   WBC 5.8  4.6 - 10.2 K/uL   Lymph, poc 2.3  0.6 - 3.4   POC LYMPH PERCENT 39.0  10 - 50 %L   MID (cbc) 0.4  0 - 0.9   POC MID % 6.4  0 - 12 %M   POC Granulocyte 3.2  2 - 6.9   Granulocyte percent 54.6  37 - 80 %G   RBC 4.34  4.04 - 5.48 M/uL   Hemoglobin 12.3  12.2 - 16.2 g/dL   HCT, POC 40.9  81.1 - 47.9 %   MCV 94.5  80 - 97 fL   MCH, POC 28.3  27 - 31.2 pg   MCHC 30.0 (*) 31.8 - 35.4 g/dL   RDW, POC 91.4     Platelet Count, POC 235  142 - 424 K/uL   MPV 10.2  0 - 99.8 fL        Assessment & Plan:  Patient here with a lesion in the left anterior rib. We'll proceed with blood work her myeloma first. Once we have these results we'll contact the thoracic surgeons to see if they would consider biopsy of this lesion if we do not have a diagnosis. This patient a mammogram in March which was normal. This patient is up-to-date on her colonoscopies. She has had borderline low hemoglobin and low platelet count presumed secondary to her MS treatment but these may be related to a malignancy she also could have an unusual lesion in the ribs such as fungal infection.

## 2012-04-29 ENCOUNTER — Telehealth: Payer: Self-pay | Admitting: Radiology

## 2012-04-29 LAB — COMPREHENSIVE METABOLIC PANEL
Albumin: 4.1 g/dL (ref 3.5–5.2)
BUN: 16 mg/dL (ref 6–23)
CO2: 29 mEq/L (ref 19–32)
Calcium: 9.1 mg/dL (ref 8.4–10.5)
Chloride: 105 mEq/L (ref 96–112)
Creat: 0.71 mg/dL (ref 0.50–1.10)
Potassium: 4 mEq/L (ref 3.5–5.3)

## 2012-04-29 NOTE — Telephone Encounter (Signed)
Dr Cleta Alberts wanted me to get most recent colonoscopy results ,I called Dr Loreta Ave office and they sent endoscopy capsule results, need to call back and have them send in colonoscopy report ph#[phone]

## 2012-04-30 ENCOUNTER — Other Ambulatory Visit: Payer: Self-pay | Admitting: Emergency Medicine

## 2012-04-30 ENCOUNTER — Telehealth: Payer: Self-pay | Admitting: Emergency Medicine

## 2012-04-30 DIAGNOSIS — C9000 Multiple myeloma not having achieved remission: Secondary | ICD-10-CM

## 2012-04-30 LAB — SPEP & IFE WITH QIG
Albumin ELP: 52.9 % — ABNORMAL LOW (ref 55.8–66.1)
Beta Globulin: 5.4 % (ref 4.7–7.2)
IgA: 202 mg/dL (ref 69–380)
M-Spike, %: 0.59 g/dL
Total Protein, Serum Electrophoresis: 7.4 g/dL (ref 6.0–8.3)

## 2012-04-30 NOTE — Telephone Encounter (Signed)
Please copy of all labs to Dr. Myna Hidalgo. Please call patient left the no her labs are suspicious for her to have multiple myeloma. I have made an appointment for her to see Dr.Ennever have this evaluated. Please send a copy of her serum protein and immunoelectrophoresis to Dr. Marton Redwood

## 2012-04-30 NOTE — Telephone Encounter (Signed)
Called Dr Loreta Ave office and they will fax this.

## 2012-05-03 ENCOUNTER — Telehealth: Payer: Self-pay | Admitting: Hematology & Oncology

## 2012-05-03 NOTE — Telephone Encounter (Signed)
Gave pt message from Dr Cleta Alberts and explained referral appt to Dr Myna Hidalgo. Pt agreed to referral. Results not back yet on the two labs to be sent to Dr Sandria Manly.  Lab, please fax results when final to Dr Sandria Manly and also Dr Drue Dun so that he will have all of the results.

## 2012-05-03 NOTE — Telephone Encounter (Signed)
Pt aware of 9-26 appointment

## 2012-05-05 NOTE — Telephone Encounter (Signed)
SPEP is still not in, will forward to Dr Sandria Manly when it is available. [phone]

## 2012-05-06 ENCOUNTER — Ambulatory Visit (HOSPITAL_BASED_OUTPATIENT_CLINIC_OR_DEPARTMENT_OTHER)
Admission: RE | Admit: 2012-05-06 | Discharge: 2012-05-06 | Disposition: A | Discharge status: Home / Self Care | Payer: BC Managed Care – PPO | Source: Ambulatory Visit | Attending: Hematology & Oncology | Admitting: Hematology & Oncology

## 2012-05-06 ENCOUNTER — Other Ambulatory Visit: Payer: Self-pay | Admitting: Hematology & Oncology

## 2012-05-06 ENCOUNTER — Ambulatory Visit (HOSPITAL_BASED_OUTPATIENT_CLINIC_OR_DEPARTMENT_OTHER): Payer: BC Managed Care – PPO | Admitting: Hematology & Oncology

## 2012-05-06 ENCOUNTER — Other Ambulatory Visit: Payer: Self-pay | Admitting: Family Medicine

## 2012-05-06 ENCOUNTER — Ambulatory Visit (HOSPITAL_COMMUNITY)
Admission: RE | Admit: 2012-05-06 | Discharge: 2012-05-06 | Disposition: A | Discharge status: Home / Self Care | Payer: BC Managed Care – PPO | Source: Ambulatory Visit | Attending: Hematology & Oncology | Admitting: Hematology & Oncology

## 2012-05-06 ENCOUNTER — Ambulatory Visit (HOSPITAL_BASED_OUTPATIENT_CLINIC_OR_DEPARTMENT_OTHER): Payer: BC Managed Care – PPO | Admitting: Lab

## 2012-05-06 ENCOUNTER — Ambulatory Visit: Payer: BC Managed Care – PPO

## 2012-05-06 DIAGNOSIS — D472 Monoclonal gammopathy: Secondary | ICD-10-CM

## 2012-05-06 DIAGNOSIS — N200 Calculus of kidney: Secondary | ICD-10-CM | POA: Insufficient documentation

## 2012-05-06 DIAGNOSIS — R799 Abnormal finding of blood chemistry, unspecified: Secondary | ICD-10-CM

## 2012-05-06 DIAGNOSIS — G35 Multiple sclerosis: Secondary | ICD-10-CM

## 2012-05-06 DIAGNOSIS — I517 Cardiomegaly: Secondary | ICD-10-CM | POA: Insufficient documentation

## 2012-05-06 DIAGNOSIS — D480 Neoplasm of uncertain behavior of bone and articular cartilage: Secondary | ICD-10-CM

## 2012-05-06 DIAGNOSIS — I10 Essential (primary) hypertension: Secondary | ICD-10-CM

## 2012-05-06 DIAGNOSIS — C9030 Solitary plasmacytoma not having achieved remission: Secondary | ICD-10-CM

## 2012-05-06 LAB — CBC WITH DIFFERENTIAL (CANCER CENTER ONLY)
Eosinophils Absolute: 0.5 10*3/uL (ref 0.0–0.5)
HCT: 37.2 % (ref 34.8–46.6)
LYMPH%: 38.4 % (ref 14.0–48.0)
MCH: 29.9 pg (ref 26.0–34.0)
MCV: 91 fL (ref 81–101)
MONO#: 0.5 10*3/uL (ref 0.1–0.9)
MONO%: 8 % (ref 0.0–13.0)
NEUT%: 46.5 % (ref 39.6–80.0)
Platelets: 215 10*3/uL (ref 145–400)
RBC: 4.11 10*6/uL (ref 3.70–5.32)
RDW: 12.4 % (ref 11.1–15.7)
WBC: 6.7 10*3/uL (ref 3.9–10.0)

## 2012-05-06 NOTE — Patient Instructions (Signed)
Call if pain issues

## 2012-05-06 NOTE — Progress Notes (Signed)
CC:   [name]. Cleta Alberts, M.D.  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 2. 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  HISTORY OF PRESENT ILLNESS:  Mrs. Durman is a very charming 61 year old African American female.  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  She is followed by Dr. Earl Lites.  She went to see Dr. Cleta Alberts because she bent over a week or so ago.  She felt a sharp pain over on her left chest wall.  She underwent a chest x-ray.  Chest x-ray was abnormal for a left upper lobe asymmetry.  It is felt that this may represent airspace disease or possibly a left 2nd rib lesion.  Dr. Cleta Alberts was very thorough and wanted to make sure that all "bases were covered."  He ordered a CT scan for her.  The CT scan was done on, I think, the 15th of September.  Surprising, CT scan showed a new expansile lesion involving the left anterior 2nd rib.  This measured 2.3 x 4.8 cm.  No other obvious rib lesions were noted.  There was no adenopathy.  Lungs looked clear.  She did have some blood work done in followup.  She had a CBC which showed hemoglobin 12.3, hematocrit 41.  Her white cell count was 5.8 with a platelet count of 295.  Her total protein was 7.4 with a BUN of 4.1.  Calcium was 9.1.  Her BUN and creatinine were normal.  Dr. Cleta Alberts, being very thorough, did an SPEP on her.  This showed a monoclonal spike of 0.59 g/dL.  Serum IFE showed an IgG kappa protein. She had 1510 mg/dL of IgG.  Of note, Ms. Lytton does have multiple sclerosis.  She is on Betaseron for this.  She was diagnosed back in 2001.  Dr. Cleta Alberts kindly referred her to the [facility] for further evaluation.  Ms. Ortloff says she feels okay.  She does have a little bit of tenderness over on the left side.  There is no cough.  She has had no weight loss recently.  She has had no fever, sweats or chills.  There is  no change in bowel or bladder habits.  She has had no leg swelling.  She has had no headache.  There has been no double vision or blurred vision.  PAST MEDICAL HISTORY:  Remarkable for: 1. Multiple sclerosis. 2. Hypertension. 3. Asthma. 4. GERD. 5. Hyperlipidemia. 6. Osteoarthritis.  ALLERGIES: 1. Ropinirole. 2. Ultram. 3. Robitussin. 4. Dristan.  MEDICATIONS: 1. Proventil inhaler 2 puffs q.6 hours p.r.n. 2. Norvasc 10 mg p.o. daily. 3. Cipro eyedrops 1 drop q.4 hours. 4. Iron sulfate 325 mg p.o. daily. 5. Flonase nasal spray 2 sprays daily. 6. Vicodin (5/325) one p.o. q.6 hours p.r.n. 7. Betaseron 0.25 mg every other day subcutaneously. 8. Cozaar 50 mg p.o. daily. 9. Mobic 15 mg p.o. daily p.r.n. 10.__________ 20 mg p.o. daily 11.Naprosyn 250 mg p.o. b.i.d. p.r.n.  SOCIAL HISTORY:  Negative for tobacco use.  There is no alcohol use.  FAMILY HISTORY:  Remarkable for, I think, father with lung cancer.  I think she had a sister with ovarian cancer.  REVIEW OF SYSTEMS:  As stated in history of present illness.  No additional findings noted on a 12-system review.  PHYSICAL EXAMINATION:  This is a well-developed, well-nourished black female in no obvious distress.  Vital  signs:  Temperature of 98.3, pulse 86, respiratory rate 20, blood pressure 129/83.  Weight is 219.  Head and neck:  Normocephalic, atraumatic skull.  There are no ocular or oral lesions.  There are no palpable cervical or supraclavicular lymph nodes. Lungs:  Clear to percussion and auscultation bilaterally.  Cardiac: Regular rate and rhythm with a normal S1 and S2.  There are no murmurs, rubs or bruits.  Abdomen:  Soft with good bowel sounds.  There is no palpable abdominal mass.  There is no fluid wave.  There is no palpable hepatosplenomegaly.  Back:  No tenderness over the spine or hips.  There may be some slight tenderness over the lateral upper left ribcage. Extremities:  No clubbing, cyanosis or  edema.  Neurological:  No focal neurological deficits.  LABORATORY STUDIES:  White cell count 6.7, hemoglobin 12.3, hematocrit 37.2, platelet count 215.  IMPRESSION:  Ms. Vasey is a 61 year old African American female with an expansile lytic lesion of the anterior left 2nd rib.  She has a monoclonal spike that is IgG kappa in nature.  This certainly could be myeloma.  I think that we are going to have to get a biopsy of this lesion.  I spoke to Dr. __________ of Interventional Radiation.  He thinks that this could be done.  I did go ahead and get a bone survey on her today.  The bone survey showed the expansile lesion.  Otherwise, there was some questionable foci in the temporal bone.  Otherwise, no other lymph node lesions were noted within the skeletal system.  Again, I think our recommendations will really hinge on this biopsy.  If this is positive for plasma cells/plasmacytoma, then I would work her up for actual myeloma.  We will have to get a bone marrow biopsy on her.  We will see what her monoclonal multiple myeloma panel shows.  I think as far as treatment for this plasmacytoma, we may want to consider radiation therapy.  I suspect that if this is myeloma, that we would also consider using Zometa which definitely would be a benefit.  I spent a good hour and a half with Ms. Clewis.  She is very, very nice. I certainly enjoyed being with her.  I gave her a prayer blanket which made her feel better.  I will be back in touch with Ms. Schuchard once I get the results back from her biopsy.    ______________________________ Josph Macho, M.D. PRE/MEDQ  D:  05/06/2012  T:  05/06/2012  Job:  [id number]

## 2012-05-06 NOTE — Progress Notes (Signed)
This office note has been dictated.

## 2012-05-06 NOTE — Telephone Encounter (Signed)
SPEP is in, Dr Cleta Alberts wants to discuss with Dr Myna Hidalgo. I have called Dr Myna Hidalgo and Dr Cleta Alberts has discussed with him.

## 2012-05-10 ENCOUNTER — Encounter (HOSPITAL_COMMUNITY): Payer: Self-pay | Admitting: Pharmacy Technician

## 2012-05-10 LAB — COMPREHENSIVE METABOLIC PANEL
ALT: 24 U/L (ref 0–35)
AST: 29 U/L (ref 0–37)
Albumin: 4.2 g/dL (ref 3.5–5.2)
Alkaline Phosphatase: 99 U/L (ref 39–117)
Chloride: 107 mEq/L (ref 96–112)
Potassium: 4 mEq/L (ref 3.5–5.3)
Sodium: 141 mEq/L (ref 135–145)
Total Protein: 7.5 g/dL (ref 6.0–8.3)

## 2012-05-10 LAB — IFE INTERPRETATION

## 2012-05-10 LAB — IGG, IGA, IGM: IgM, Serum: 90 mg/dL (ref 52–322)

## 2012-05-10 LAB — KAPPA/LAMBDA LIGHT CHAINS
Kappa free light chain: 2.01 mg/dL — ABNORMAL HIGH (ref 0.33–1.94)
Kappa:Lambda Ratio: 1.05 (ref 0.26–1.65)

## 2012-05-10 LAB — PROTEIN ELECTROPHORESIS, SERUM, WITH REFLEX
Albumin ELP: 52.8 % — ABNORMAL LOW (ref 55.8–66.1)
M-Spike, %: 0.62 g/dL
Total Protein, Serum Electrophoresis: 7.5 g/dL (ref 6.0–8.3)

## 2012-05-11 DIAGNOSIS — C801 Malignant (primary) neoplasm, unspecified: Secondary | ICD-10-CM

## 2012-05-11 HISTORY — DX: Malignant (primary) neoplasm, unspecified: C80.1

## 2012-05-12 ENCOUNTER — Other Ambulatory Visit: Payer: Self-pay | Admitting: Radiology

## 2012-05-17 ENCOUNTER — Ambulatory Visit (HOSPITAL_COMMUNITY)
Admission: RE | Admit: 2012-05-17 | Discharge: 2012-05-17 | Disposition: A | Discharge status: Home / Self Care | Payer: BC Managed Care – PPO | Source: Ambulatory Visit | Attending: Hematology & Oncology | Admitting: Hematology & Oncology

## 2012-05-17 ENCOUNTER — Ambulatory Visit (HOSPITAL_COMMUNITY)
Admission: RE | Admit: 2012-05-17 | Discharge: 2012-05-17 | Disposition: A | Discharge status: Home / Self Care | Payer: BC Managed Care – PPO | Source: Ambulatory Visit | Attending: Interventional Radiology | Admitting: Interventional Radiology

## 2012-05-17 ENCOUNTER — Encounter (HOSPITAL_COMMUNITY): Payer: Self-pay

## 2012-05-17 DIAGNOSIS — C9000 Multiple myeloma not having achieved remission: Secondary | ICD-10-CM

## 2012-05-17 DIAGNOSIS — Z79899 Other long term (current) drug therapy: Secondary | ICD-10-CM | POA: Insufficient documentation

## 2012-05-17 DIAGNOSIS — D472 Monoclonal gammopathy: Secondary | ICD-10-CM

## 2012-05-17 DIAGNOSIS — I517 Cardiomegaly: Secondary | ICD-10-CM | POA: Insufficient documentation

## 2012-05-17 DIAGNOSIS — C9030 Solitary plasmacytoma not having achieved remission: Secondary | ICD-10-CM

## 2012-05-17 DIAGNOSIS — G35 Multiple sclerosis: Secondary | ICD-10-CM | POA: Insufficient documentation

## 2012-05-17 DIAGNOSIS — M949 Disorder of cartilage, unspecified: Secondary | ICD-10-CM | POA: Insufficient documentation

## 2012-05-17 DIAGNOSIS — M899 Disorder of bone, unspecified: Secondary | ICD-10-CM | POA: Insufficient documentation

## 2012-05-17 HISTORY — PX: BONE BIOPSY: SHX375

## 2012-05-17 HISTORY — DX: Multiple myeloma not having achieved remission: C90.00

## 2012-05-17 LAB — CBC
HCT: 33.6 % — ABNORMAL LOW (ref 36.0–46.0)
MCH: 29.8 pg (ref 26.0–34.0)
MCHC: 33 g/dL (ref 30.0–36.0)
MCV: 90.3 fL (ref 78.0–100.0)
Platelets: 145 10*3/uL — ABNORMAL LOW (ref 150–400)
RDW: 12.5 % (ref 11.5–15.5)
WBC: 5.2 10*3/uL (ref 4.0–10.5)

## 2012-05-17 MED ORDER — FENTANYL CITRATE 0.05 MG/ML IJ SOLN
INTRAMUSCULAR | Status: AC | PRN
  Administered 2012-05-17: 100 ug via INTRAVENOUS

## 2012-05-17 MED ORDER — MIDAZOLAM HCL 2 MG/2ML IJ SOLN
INTRAMUSCULAR | Status: AC
  Filled 2012-05-17: qty 4

## 2012-05-17 MED ORDER — SODIUM CHLORIDE 0.9 % IV SOLN
INTRAVENOUS | Status: DC
  Administered 2012-05-17: 08:00:00 via INTRAVENOUS

## 2012-05-17 MED ORDER — FENTANYL CITRATE 0.05 MG/ML IJ SOLN
INTRAMUSCULAR | Status: AC
  Filled 2012-05-17: qty 4

## 2012-05-17 MED ORDER — HYDROCODONE-ACETAMINOPHEN 5-325 MG PO TABS
1.0000 | ORAL_TABLET | ORAL | Status: DC | PRN
  Administered 2012-05-17: 1 via ORAL
  Filled 2012-05-17: qty 2
  Filled 2012-05-17: qty 1

## 2012-05-17 MED ORDER — MIDAZOLAM HCL 5 MG/5ML IJ SOLN
INTRAMUSCULAR | Status: AC | PRN
  Administered 2012-05-17: 2 mg via INTRAVENOUS

## 2012-05-17 NOTE — Progress Notes (Signed)
Post CXR 1 hour pt has been up to [place] and has eaten lunch and voices no c/o. This was reported to dr Archer Asa  Received CXR report that "no pneumothorax" . Pt is OK for discharge

## 2012-05-17 NOTE — H&P (Signed)
Agree with PA note.  Possible plasmacytoma.  Will proceed with core biopsy.   Signed,  Sterling Big, MD Vascular & Interventional Radiologist [facility]

## 2012-05-17 NOTE — Procedures (Signed)
Interventional Radiology Procedure Note  Procedure: CT guided core bx of lytic left 2nd rib lesion  Complications: None Recommendations: - Bedrest x 2 hrs - 2 hr CXR - Discharge at 3 hrs if no complication  Signed,  Sterling Big, MD Vascular & Interventional Radiologist [facility]

## 2012-05-17 NOTE — H&P (Signed)
Morgan Espinoza is an 61 y.o. female.   Chief Complaint: left lytic rib lesion of uncertain etiology. Presents today for needle biopsy to establish diagnosis.  HPI: See oncology note below :  Josph Macho, MD Physician Signed  Progress Notes 05/06/2012 7:18 PM  Related encounter: Office Visit from 05/06/2012 in [facility]  CC:   [name]. Cleta Alberts, M.D.   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 2. 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   HISTORY OF PRESENT ILLNESS:  Morgan Espinoza is a very charming 61 year old African American female.  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   She is followed by Dr. Earl Lites.  She went to see Dr. Cleta Alberts because she bent over a week or so ago.  She felt a sharp pain over on her left chest wall.   She underwent a chest x-ray.  Chest x-ray was abnormal for a left upper lobe asymmetry.  It is felt that this may represent airspace disease or possibly a left 2nd rib lesion.   Dr. Cleta Alberts was very thorough and wanted to make sure that all "bases were covered."  He ordered a CT scan for her.  The CT scan was done on, I think, the 15th of September.  Surprising, CT scan showed a new expansile lesion involving the left anterior 2nd rib.  This measured 2.3 x 4.8 cm.  No other obvious rib lesions were noted.  There was no adenopathy.  Lungs looked clear.   She did have some blood work done in followup.  She had a CBC which showed hemoglobin 12.3, hematocrit 41.  Her white cell count was 5.8 with a platelet count of 295.  Her total protein was 7.4 with a BUN of 4.1.  Calcium was 9.1.  Her BUN and creatinine were normal.   Dr. Cleta Alberts, being very thorough, did an SPEP on her.  This showed a monoclonal spike of 0.59 g/dL.  Serum IFE showed an IgG kappa protein. She had 1510 mg/dL of IgG.   Of note, Ms. Gundy does have multiple sclerosis.   She is on Betaseron for this.  She was diagnosed back in 2001.   Dr. Cleta Alberts kindly referred her to the [facility] for further evaluation.   Ms. Neukam says she feels okay.  She does have a little bit of tenderness over on the left side.  There is no cough.  She has had no weight loss recently.  She has had no fever, sweats or chills.  There is no change in bowel or bladder habits.  She has had no leg swelling.  She has had no headache.  There has been no double vision or blurred vision.   PAST MEDICAL HISTORY:  Remarkable for: 1. Multiple sclerosis. 2. Hypertension. 3. Asthma. 4. GERD. 5. Hyperlipidemia. 6. Osteoarthritis.   ALLERGIES: 1. Ropinirole. 2. Ultram. 3. Robitussin. 4. Dristan.   MEDICATIONS: 1. Proventil inhaler 2 puffs q.6 hours p.r.n. 2. Norvasc 10 mg p.o. daily. 3. Cipro eyedrops 1 drop q.4 hours. 4. Iron sulfate 325 mg p.o. daily. 5. Flonase nasal spray 2 sprays daily. 6. Vicodin (5/325) one p.o. q.6 hours p.r.n. 7. Betaseron 0.25 mg every other day subcutaneously. 8. Cozaar 50 mg p.o. daily. 9. Mobic 15 mg p.o. daily p.r.n. 10.__________ 20 mg p.o. daily 11.Naprosyn 250 mg p.o.  b.i.d. p.r.n.   SOCIAL HISTORY:  Negative for tobacco use.  There is no alcohol use.   FAMILY HISTORY:  Remarkable for, I think, father with lung cancer.  I think she had a sister with ovarian cancer.   REVIEW OF SYSTEMS:  As stated in history of present illness.  No additional findings noted on a 12-system review.   PHYSICAL EXAMINATION:  This is a well-developed, well-nourished black female in no obvious distress.  Vital signs:  Temperature of 98.3, pulse 86, respiratory rate 20, blood pressure 129/83.  Weight is 219.  Head and neck:  Normocephalic, atraumatic skull.  There are no ocular or oral lesions.  There are no palpable cervical or supraclavicular lymph nodes. Lungs:  Clear to percussion and auscultation bilaterally.  Cardiac: Regular rate and  rhythm with a normal S1 and S2.  There are no murmurs, rubs or bruits.  Abdomen:  Soft with good bowel sounds.  There is no palpable abdominal mass.  There is no fluid wave.  There is no palpable hepatosplenomegaly.  Back:  No tenderness over the spine or hips.  There may be some slight tenderness over the lateral upper left ribcage. Extremities:  No clubbing, cyanosis or edema.  Neurological:  No focal neurological deficits.   LABORATORY STUDIES:  White cell count 6.7, hemoglobin 12.3, hematocrit 37.2, platelet count 215.   IMPRESSION:  Ms. Harth is a 61 year old African American female with an expansile lytic lesion of the anterior left 2nd rib.  She has a monoclonal spike that is IgG kappa in nature.   This certainly could be myeloma.  I think that we are going to have to get a biopsy of this lesion.  I spoke to Dr. __________ of Interventional Radiation.  He thinks that this could be done.   I did go ahead and get a bone survey on her today.  The bone survey showed the expansile lesion.  Otherwise, there was some questionable foci in the temporal bone.  Otherwise, no other lymph node lesions were noted within the skeletal system.   Again, I think our recommendations will really hinge on this biopsy.  If this is positive for plasma cells/plasmacytoma, then I would work her up for actual myeloma.  We will have to get a bone marrow biopsy on her.   We will see what her monoclonal multiple myeloma panel shows.   I think as far as treatment for this plasmacytoma, we may want to consider radiation therapy.  I suspect that if this is myeloma, that we would also consider using Zometa which definitely would be a benefit.   I spent a good hour and a half with Ms. Kuehnle.  She is very, very nice. I certainly enjoyed being with her.  I gave her a prayer blanket which made her feel better.   I will be back in touch with Ms. Weitman once I get the results back from her biopsy.         ______________________________ Josph Macho, M.D. PRE/MEDQ  D:  05/06/2012  T:  05/06/2012  Job:  [id number]  Last signed by: Josph Macho, MD    [05/10/2012 7:12 AM]  Routing History...        Today's examination findings :     Past Medical History  Diagnosis Date  . Hypertension   . GERD (gastroesophageal reflux disease)   . Asthma   . Multiple sclerosis   . Anemia   . Hyperlipidemia   . Vertigo   .  Vitamin D deficiency   . Obesity   . Achilles tendonitis   . OA (osteoarthritis) of knee     Past Surgical History  Procedure Date  . Cholecystectomy   . Abdominal hysterectomy   . Breast surgery     biopsy  . Rotator cuff repair     Family History  Problem Relation Age of Onset  . Dementia Mother   . Hypertension Mother   . Cancer Father     lung; +tobacco   Social History:  reports that she quit smoking about 33 years ago. Her smoking use included Cigarettes. She has never used smokeless tobacco. She reports that she does not drink alcohol or use illicit drugs.  Allergies:  Allergies  Allergen Reactions  . Dristan   . Robitussin (Alcohol Free) (Guaifenesin)   . Ropinirole Hcl   . Tramadol      Medication List     As of 05/17/2012  8:42 AM    ASK your doctor about these medications         acetaminophen 325 MG tablet   Commonly known as: TYLENOL   Take 650 mg by mouth every 6 (six) hours as needed.      albuterol 108 (90 BASE) MCG/ACT inhaler   Commonly known as: PROVENTIL HFA;VENTOLIN HFA   Inhale 2 puffs into the lungs every 6 (six) hours as needed.      amLODipine 10 MG tablet   Commonly known as: NORVASC   Take 10 mg by mouth every morning.      ciprofloxacin 0.3 % ophthalmic solution   Commonly known as: CILOXAN   1 drop every 2 (two) hours. Administer 1 drop, every 2 hours, while awake, for 2 days. Then 1 drop, every 4 hours, while awake, for the next 5 days.      ferrous sulfate 325 (65 FE) MG tablet   Take 1 tablet (325 mg total) by  mouth daily with breakfast.      fluticasone 50 MCG/ACT nasal spray   Commonly known as: FLONASE   Place 2 sprays into the nose daily.      HYDROcodone-acetaminophen 5-325 MG per tablet   Commonly known as: NORCO/VICODIN   Take 1 tablet by mouth every 6 (six) hours as needed.      interferon beta-1b 0.3 MG injection   Commonly known as: BETASERON   Inject 0.25 mg into the skin every other day.      losartan 50 MG tablet   Commonly known as: COZAAR   Take 50 mg by mouth every morning.      meloxicam 15 MG tablet   Commonly known as: MOBIC   Take 7.5-15 mg by mouth daily.      naproxen 250 MG tablet   Commonly known as: NAPROSYN   Take 250 mg by mouth 2 (two) times daily with a meal.      omeprazole 20 MG capsule   Commonly known as: PRILOSEC   Take 20 mg by mouth daily.        Labs :   Results for orders placed during the hospital encounter of 05/17/12 (from the past 48 hour(s))  APTT     Status: Normal   Collection Time   05/17/12  7:30 AM      Component Value Range Comment   aPTT 30  24 - 37 seconds   CBC     Status: Abnormal   Collection Time   05/17/12  7:30 AM  Component Value Range Comment   WBC 5.2  4.0 - 10.5 K/uL    RBC 3.72 (*) 3.87 - 5.11 MIL/uL    Hemoglobin 11.1 (*) 12.0 - 15.0 g/dL    HCT 16.1 (*) 09.6 - 46.0 %    MCV 90.3  78.0 - 100.0 fL    MCH 29.8  26.0 - 34.0 pg    MCHC 33.0  30.0 - 36.0 g/dL    RDW 04.5  40.9 - 81.1 %    Platelets 145 (*) 150 - 400 K/uL   PROTIME-INR     Status: Normal   Collection Time   05/17/12  7:30 AM      Component Value Range Comment   Prothrombin Time 13.1  11.6 - 15.2 seconds    INR 1.00  0.00 - 1.49     Review of Systems  Constitutional: Negative for fever, chills, weight loss and malaise/fatigue.  Respiratory: Negative for cough, hemoptysis, sputum production, shortness of breath and wheezing.        Left chest wall tenderness to palpation   Cardiovascular: Positive for leg swelling.       L>R    Gastrointestinal: Positive for heartburn. Negative for nausea, vomiting and abdominal pain.  Musculoskeletal: Positive for joint pain.       Pain at left ankle with palpation   Skin: Negative.   Neurological: Positive for sensory change and focal weakness. Negative for seizures, loss of consciousness and weakness.       Hx of MS  Psychiatric/Behavioral: Negative.     Physical Exam  Constitutional: She is oriented to person, place, and time. She appears well-developed and well-nourished. No distress.  HENT:  Head: Atraumatic.  Eyes: Pupils are equal, round, and reactive to light.  Cardiovascular: Normal rate, regular rhythm and normal heart sounds.  Exam reveals no gallop and no friction rub.   No murmur heard. Respiratory: Effort normal and breath sounds normal. No respiratory distress. She has no wheezes. She has no rales.  GI: Soft. Bowel sounds are normal. She exhibits no distension.  Musculoskeletal: Normal range of motion. She exhibits edema.  Neurological: She is alert and oriented to person, place, and time.  Skin: Skin is warm and dry. No rash noted. No erythema.  Psychiatric: She has a normal mood and affect. Her behavior is normal. Judgment and thought content normal.     Assessment/Plan Pprocedure details for left rib needle biopsy discussed in detail with patient. Potential complications discussed including but not limited to infection, pneumothorax, inadequate sampling, bleeding and complications with moderate sedation reviewed with the patient's apparent understanding. Written consent obtained. Labs WNL to proceed.   Ismerai Bin D 05/17/2012, 8:41 AM

## 2012-05-24 ENCOUNTER — Other Ambulatory Visit: Payer: Self-pay | Admitting: Physician Assistant

## 2012-05-24 NOTE — Telephone Encounter (Signed)
Pt needs OV 

## 2012-06-13 ENCOUNTER — Ambulatory Visit (INDEPENDENT_AMBULATORY_CARE_PROVIDER_SITE_OTHER): Payer: BC Managed Care – PPO | Admitting: Family Medicine

## 2012-06-13 VITALS — BP 154/84 | HR 88 | Temp 98.4°F | Resp 18 | Wt 224.0 lb

## 2012-06-13 DIAGNOSIS — M7750 Other enthesopathy of unspecified foot: Secondary | ICD-10-CM

## 2012-06-13 DIAGNOSIS — J040 Acute laryngitis: Secondary | ICD-10-CM

## 2012-06-13 DIAGNOSIS — M659 Synovitis and tenosynovitis, unspecified: Secondary | ICD-10-CM

## 2012-06-13 DIAGNOSIS — E538 Deficiency of other specified B group vitamins: Secondary | ICD-10-CM

## 2012-06-13 MED ORDER — CYANOCOBALAMIN 1000 MCG/ML IJ SOLN
1000.0000 ug | Freq: Once | INTRAMUSCULAR | Status: AC
  Administered 2012-06-13: 1000 ug via INTRAMUSCULAR

## 2012-06-13 MED ORDER — METHYLPREDNISOLONE ACETATE 80 MG/ML IJ SUSP
40.0000 mg | Freq: Once | INTRAMUSCULAR | Status: AC
  Administered 2012-06-13: 40 mg via INTRAMUSCULAR

## 2012-06-13 NOTE — Progress Notes (Signed)
61 yo AA woman with sore feet, right worse than left, on the outer Vth metatarsal.  No trauma.  Pain worse in am.  She's been taking 1/2 of a mobic qam.  Not taking Naproxen.  Also, would like a B12 shot.  She  Gets one qmonth at the end of which she starts having cramps.  Has been receiving the shots for over a year  Also, she has been hoarse recently "because of the season change"  Grandmother in the  Nursing home at age 33, family coming up from [place] for Thanksgiving.  O:  NAD Cheerful Feet:  Normal inspection, minimal tenderness, FROM Scratchy voice  Assessment:  Mild laryngitis 1. Tendonitis of foot  methylPREDNISolone acetate (DEPO-MEDROL) injection 40 mg  2. Vitamin B12 deficiency  cyanocobalamin ((VITAMIN B-12)) injection 1,000 mcg

## 2012-06-21 ENCOUNTER — Other Ambulatory Visit: Payer: Self-pay | Admitting: Physician Assistant

## 2012-07-02 ENCOUNTER — Ambulatory Visit (INDEPENDENT_AMBULATORY_CARE_PROVIDER_SITE_OTHER): Payer: BC Managed Care – PPO | Admitting: Internal Medicine

## 2012-07-02 VITALS — BP 154/86 | HR 82 | Temp 98.2°F | Resp 16 | Ht 60.5 in | Wt 219.4 lb

## 2012-07-02 DIAGNOSIS — E538 Deficiency of other specified B group vitamins: Secondary | ICD-10-CM

## 2012-07-02 DIAGNOSIS — D649 Anemia, unspecified: Secondary | ICD-10-CM

## 2012-07-02 DIAGNOSIS — R002 Palpitations: Secondary | ICD-10-CM

## 2012-07-02 LAB — IRON AND TIBC: TIBC: 315 ug/dL (ref 250–470)

## 2012-07-02 LAB — POCT CBC
Granulocyte percent: 50.3 %G (ref 37–80)
Hemoglobin: 11.7 g/dL — AB (ref 12.2–16.2)
MCH, POC: 28.5 pg (ref 27–31.2)
MID (cbc): 0.5 (ref 0–0.9)
MPV: 10 fL (ref 0–99.8)
POC Granulocyte: 3.3 (ref 2–6.9)
POC MID %: 8.3 %M (ref 0–12)
Platelet Count, POC: 130 10*3/uL — AB (ref 142–424)
RBC: 4.11 M/uL (ref 4.04–5.48)

## 2012-07-02 MED ORDER — CYANOCOBALAMIN 1000 MCG/ML IJ SOLN
1000.0000 ug | Freq: Once | INTRAMUSCULAR | Status: AC
  Administered 2012-07-02: 1000 ug via INTRAMUSCULAR

## 2012-07-02 NOTE — Patient Instructions (Addendum)
Palpitations  A palpitation is the feeling that your heartbeat is irregular or is faster than normal. It may feel like your heart is fluttering or skipping a beat. Palpitations are usually not a serious problem. However, in some cases, you may need further medical evaluation. CAUSES  Palpitations can be caused by:  Smoking.  Caffeine or other stimulants, such as diet pills or energy drinks.  Alcohol.  Stress and anxiety.  Strenuous physical activity.  Fatigue.  Certain medicines.  Heart disease, especially if you have a history of arrhythmias. This includes atrial fibrillation, atrial flutter, or supraventricular tachycardia.  An improperly working pacemaker or defibrillator. DIAGNOSIS  To find the cause of your palpitations, your caregiver will take your history and perform a physical exam. Tests may also be done, including:  Electrocardiography (ECG). This test records the heart's electrical activity.  Cardiac monitoring. This allows your caregiver to monitor your heart rate and rhythm in real time.  Holter monitor. This is a portable device that records your heartbeat and can help diagnose heart arrhythmias. It allows your caregiver to track your heart activity for several days, if needed.  Stress tests by exercise or by giving medicine that makes the heart beat faster. TREATMENT  Treatment of palpitations depends on the cause of your symptoms and can vary greatly. Most cases of palpitations do not require any treatment other than time, relaxation, and monitoring your symptoms. Other causes, such as atrial fibrillation, atrial flutter, or supraventricular tachycardia, usually require further treatment. HOME CARE INSTRUCTIONS   Avoid:  Caffeinated coffee, tea, soft drinks, diet pills, and energy drinks.  Chocolate.  Alcohol.  Stop smoking if you smoke.  Reduce your stress and anxiety. Things that can help you relax include:  A method that measures bodily functions so  you can learn to control them (biofeedback).  Yoga.  Meditation.  Physical activity such as swimming, jogging, or walking.  Get plenty of rest and sleep. SEEK MEDICAL CARE IF:   You continue to have a fast or irregular heartbeat beyond 24 hours.  Your palpitations occur more often. SEEK IMMEDIATE MEDICAL CARE IF:  You develop chest pain or shortness of breath.  You have a severe headache.  You feel dizzy, or you faint. MAKE SURE YOU:  Understand these instructions.  Will watch your condition.  Will get help right away if you are not doing well or get worse. Document Released: 07/25/2000 Document Revised: 01/27/2012 Document Reviewed: 09/26/2011 Exit[employer] Patient Information 2013 [employer].  

## 2012-07-02 NOTE — Progress Notes (Signed)
  Subjective:    Patient ID: Morgan Espinoza, female    DOB: June 24, 1951, 61 y.o.   MRN: [id number]  HPI Co palpitations and b12 deficiency. Has anemia cause unclear,Dr. Cleta Alberts doing f/up Feels good except for palpitations for 1 hr this am. All resolved now. No dizzynes, syncope, cp, sob,diaphoresis   Review of Systems     Objective:   Physical Exam  Vitals reviewed. Constitutional: She is oriented to person, place, and time. She appears well-nourished. No distress.  Eyes: EOM are normal. No scleral icterus.  Neck: Normal range of motion. No thyromegaly present.  Cardiovascular: Normal rate, regular rhythm, normal heart sounds and intact distal pulses.   No murmur heard. Pulmonary/Chest: Effort normal and breath sounds normal.  Neurological: She is alert and oriented to person, place, and time. No cranial nerve deficit. She exhibits normal muscle tone. Coordination normal.  Skin: Skin is warm and dry.  Psychiatric: She has a normal mood and affect.   ekg nl Results for orders placed in visit on 07/02/12  POCT CBC      Component Value Range   WBC 6.6  4.6 - 10.2 K/uL   Lymph, poc 2.7  0.6 - 3.4   POC LYMPH PERCENT 41.4  10 - 50 %L   MID (cbc) 0.5  0 - 0.9   POC MID % 8.3  0 - 12 %M   POC Granulocyte 3.3  2 - 6.9   Granulocyte percent 50.3  37 - 80 %G   RBC 4.11  4.04 - 5.48 M/uL   Hemoglobin 11.7 (*) 12.2 - 16.2 g/dL   HCT, POC 40.9  81.1 - 47.9 %   MCV 93.9  80 - 97 fL   MCH, POC 28.5  27 - 31.2 pg   MCHC 30.3 (*) 31.8 - 35.4 g/dL   RDW, POC 91.4     Platelet Count, POC 130 (*) 142 - 424 K/uL   MPV 10.0  0 - 99.8 fL          Assessment & Plan:  B12 sq Anemia resolved

## 2012-07-03 LAB — FERRITIN: Ferritin: 131 ng/mL (ref 10–291)

## 2012-07-03 LAB — TSH: TSH: 0.429 u[IU]/mL (ref 0.350–4.500)

## 2012-07-03 LAB — VITAMIN B12: Vitamin B-12: 528 pg/mL (ref 211–911)

## 2012-07-06 LAB — FOLATE: Folate: 7.2 ng/mL (ref >5.4)

## 2012-07-12 ENCOUNTER — Ambulatory Visit (INDEPENDENT_AMBULATORY_CARE_PROVIDER_SITE_OTHER): Payer: BC Managed Care – PPO | Admitting: Emergency Medicine

## 2012-07-12 VITALS — BP 150/90 | HR 88 | Temp 98.1°F | Resp 18 | Wt 221.0 lb

## 2012-07-12 DIAGNOSIS — C9000 Multiple myeloma not having achieved remission: Secondary | ICD-10-CM | POA: Insufficient documentation

## 2012-07-12 DIAGNOSIS — R109 Unspecified abdominal pain: Secondary | ICD-10-CM

## 2012-07-12 DIAGNOSIS — M949 Disorder of cartilage, unspecified: Secondary | ICD-10-CM

## 2012-07-12 DIAGNOSIS — M899 Disorder of bone, unspecified: Secondary | ICD-10-CM

## 2012-07-12 DIAGNOSIS — K219 Gastro-esophageal reflux disease without esophagitis: Secondary | ICD-10-CM

## 2012-07-12 MED ORDER — RANITIDINE HCL 150 MG PO TABS: 150.0000 mg | ORAL_TABLET | Freq: Two times a day (BID) | ORAL | Status: DC

## 2012-07-12 NOTE — Progress Notes (Signed)
  Subjective:    Patient ID: Morgan Espinoza, female    DOB: Apr 28, 1951, 60 y.o.   MRN: [id number]  HPI Pt here today for follow up. Advised by Dr Perrin Maltese to follow up with her doctor for possible iron supplement due to her iron being low on her labs. She is also having congestion in her chest today but it is described by pt more like indigestion or heartburn. She has a long history of iron deficiency anemia. She also has had heartburn that generally is relieved with little bit of clear sew to. Endoscopy and colonoscopy performed by Dr. Loreta Ave 01/29/09    Review of Systems 2 months ago patient had a biopsy of a lesion in the left second rib. 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     Objective:   Physical Exam HEENT exam is unremarkable. Neck is supple chest is clear to auscultation and percussion. Cardiac exam is regular rate no murmur.        Assessment & Plan:  I called Dr. Tama Gander office who did not receive a copy of her biopsy report. I called Dr. Tama Gander office and they will schedule the patient for a followup visit and decide on treatment. I placed the patient on Zantac 150 twice a day. Previous endoscopy and colonoscopies were done July 2010. Colonoscopy was normal endoscopy showed only one small erosion in the proximal small bowel . We will make sure the patient is not taking any nonsteroidals. . I do think she can try clear soda if it gives her some relief. Continue iron one a day.

## 2012-07-12 NOTE — Patient Instructions (Signed)
Gastritis, Adult Gastritis is soreness and swelling (inflammation) of the lining of the stomach. Gastritis can develop as a sudden onset (acute) or long-term (chronic) condition. If gastritis is not treated, it can lead to stomach bleeding and ulcers. CAUSES  Gastritis occurs when the stomach lining is weak or damaged. Digestive juices from the stomach then inflame the weakened stomach lining. The stomach lining may be weak or damaged due to viral or bacterial infections. One common bacterial infection is the Helicobacter pylori infection. Gastritis can also result from excessive alcohol consumption, taking certain medicines, or having too much acid in the stomach.  SYMPTOMS  In some cases, there are no symptoms. When symptoms are present, they may include:  Pain or a burning sensation in the upper abdomen.  Nausea.  Vomiting.  An uncomfortable feeling of fullness after eating. DIAGNOSIS  Your caregiver may suspect you have gastritis based on your symptoms and a physical exam. To determine the cause of your gastritis, your caregiver may perform the following:  Blood or stool tests to check for the H pylori bacterium.  Gastroscopy. A thin, flexible tube (endoscope) is passed down the esophagus and into the stomach. The endoscope has a light and camera on the end. Your caregiver uses the endoscope to view the inside of the stomach.  Taking a tissue sample (biopsy) from the stomach to examine under a microscope. TREATMENT  Depending on the cause of your gastritis, medicines may be prescribed. If you have a bacterial infection, such as an H pylori infection, antibiotics may be given. If your gastritis is caused by too much acid in the stomach, H2 blockers or antacids may be given. Your caregiver may recommend that you stop taking aspirin, ibuprofen, or other nonsteroidal anti-inflammatory drugs (NSAIDs). HOME CARE INSTRUCTIONS  Only take over-the-counter or prescription medicines as directed by  your caregiver.  If you were given antibiotic medicines, take them as directed. Finish them even if you start to feel better.  Drink enough fluids to keep your urine clear or pale yellow.  Avoid foods and drinks that make your symptoms worse, such as:  Caffeine or alcoholic drinks.  Chocolate.  Peppermint or mint flavorings.  Garlic and onions.  Spicy foods.  Citrus fruits, such as oranges, lemons, or limes.  Tomato-based foods such as sauce, chili, salsa, and pizza.  Fried and fatty foods.  Eat small, frequent meals instead of large meals. SEEK IMMEDIATE MEDICAL CARE IF:   You have black or dark red stools.  You vomit blood or material that looks like coffee grounds.  You are unable to keep fluids down.  Your abdominal pain gets worse.  You have a fever.  You do not feel better after 1 week.  You have any other questions or concerns. MAKE SURE YOU:  Understand these instructions.  Will watch your condition.  Will get help right away if you are not doing well or get worse. Document Released: 07/22/2001 Document Revised: 01/27/2012 Document Reviewed: 09/10/2011 [employer] Patient Information 2013 [employer].  

## 2012-07-13 ENCOUNTER — Other Ambulatory Visit: Payer: Self-pay | Admitting: Hematology & Oncology

## 2012-07-13 DIAGNOSIS — C9000 Multiple myeloma not having achieved remission: Secondary | ICD-10-CM

## 2012-07-13 DIAGNOSIS — D649 Anemia, unspecified: Secondary | ICD-10-CM

## 2012-07-14 ENCOUNTER — Telehealth: Payer: Self-pay | Admitting: Hematology & Oncology

## 2012-07-14 ENCOUNTER — Encounter: Payer: Self-pay | Admitting: Internal Medicine

## 2012-07-14 NOTE — Telephone Encounter (Signed)
Per MD orders to sch patient for lab/doc visit.  Apt was sch for 07/23/12.  I called and gave patient apt date/time.  Patient is aware of apt and states she will be here

## 2012-07-23 ENCOUNTER — Other Ambulatory Visit (HOSPITAL_BASED_OUTPATIENT_CLINIC_OR_DEPARTMENT_OTHER): Payer: BC Managed Care – PPO | Admitting: Lab

## 2012-07-23 ENCOUNTER — Encounter: Payer: Self-pay | Admitting: Hematology & Oncology

## 2012-07-23 ENCOUNTER — Ambulatory Visit (HOSPITAL_BASED_OUTPATIENT_CLINIC_OR_DEPARTMENT_OTHER): Payer: BC Managed Care – PPO | Admitting: Hematology & Oncology

## 2012-07-23 VITALS — BP 141/68 | HR 71 | Temp 97.8°F | Resp 18 | Ht 61.0 in | Wt 220.0 lb

## 2012-07-23 DIAGNOSIS — G35 Multiple sclerosis: Secondary | ICD-10-CM

## 2012-07-23 DIAGNOSIS — C9030 Solitary plasmacytoma not having achieved remission: Secondary | ICD-10-CM

## 2012-07-23 DIAGNOSIS — C9000 Multiple myeloma not having achieved remission: Secondary | ICD-10-CM

## 2012-07-23 DIAGNOSIS — D649 Anemia, unspecified: Secondary | ICD-10-CM

## 2012-07-23 DIAGNOSIS — D509 Iron deficiency anemia, unspecified: Secondary | ICD-10-CM

## 2012-07-23 HISTORY — DX: Iron deficiency anemia, unspecified: D50.9

## 2012-07-23 LAB — CBC WITH DIFFERENTIAL (CANCER CENTER ONLY)
BASO%: 0.8 % (ref 0.0–2.0)
EOS%: 4.5 % (ref 0.0–7.0)
LYMPH#: 1.9 10*3/uL (ref 0.9–3.3)
MCHC: 32.3 g/dL (ref 32.0–36.0)
NEUT#: 2.4 10*3/uL (ref 1.5–6.5)
RDW: 12.3 % (ref 11.1–15.7)

## 2012-07-23 NOTE — Progress Notes (Signed)
This office note has been dictated.

## 2012-07-24 ENCOUNTER — Other Ambulatory Visit: Payer: Self-pay | Admitting: Physician Assistant

## 2012-07-24 NOTE — Progress Notes (Signed)
CC:   Morgan Espinoza. Morgan Espinoza, M.D.  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  CURRENT THERAPY:  Observation.  INTERIM HISTORY:  Morgan Espinoza comes back for a followup.  We saw her back in late September.  She at that point in time was being worked up for a lytic lesion in her left rib.  We did a bone survey on her.  The bone survey showed the expansive left anterior rib lesion.  There was a "subtle" lesion over the temporal region of the lateral skull.  Also was a "subtle" lesion in the occipital bone.  Her protein studies at that time showed a monoclonal spike of 0.62 g/dL. IgG level was 1770 mg/dL and kappa light chain was 2.01 mg/dL.  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  We did get a biopsy of this rib lesion.  The pathology report ([id number]) showed plasma cells positive for kappa light chain.  Since we saw her, she has been feeling okay.  She does have multiple sclerosis which has not flared up on her.  She has had no pain issues.  She has had no bleeding.  She has had no fevers, sweats, or chills.  She has had no cough or shortness of breath.  PHYSICAL EXAMINATION:  General:  This is a well-developed, well- nourished black female in no obvious distress.  Vital signs: Temperature of 97.8, pulse 71, respiratory rate 18, blood pressure 141/68.  Weight is 220.  Espinoza and neck:  Normocephalic, atraumatic skull.  There are no ocular or oral lesions.  There are no palpable cervical or supraclavicular lymph nodes.  Lungs:  Clear bilaterally. Cardiac:  Regular rate and rhythm with a normal S1 and S2.  There are no murmurs, rubs, or bruits.  Abdomen:  Soft with good bowel sounds.  There is no palpable abdominal mass.  There is no palpable hepatosplenomegaly. Extremities:  No clubbing, cyanosis, or edema.  Back:  Slight tenderness to palpation over the left anterior rib cage.  There may be some slight fullness noted.  LABORATORY STUDIES:  White  cell count is 4.9, hemoglobin 11.4, hematocrit 35.2, platelet count is "clumped."  Iron studies show a ferritin of 131.  Iron saturation is only 11%.  IMPRESSION:  Morgan Espinoza is a 61 year old African American female with a plasmacytoma.  I think the real question is whether not she has actual myeloma.  I am not too convinced about this.  She does have a low level of monoclonal protein.  Her bone survey is certainly not convincing for other areas of plasma cells.  I think looking at the "big picture," we really have flexibility with doing an aggressive workup.  She has other health issues.  She has multiple sclerosis that we have to be careful with.  We will go ahead and get radiation to this rib lesion.  I think this would be reasonable in my mind.  I spoke with Radiation Oncology.  They will be more than happy to see her.  I do believe that she would also benefit from Zometa.  We will get this set up in a couple of weeks.  I also want to give her a dose of iron at that time.  I think some of the anemia, if not all, is because of iron deficiency.  I spent a good half hour or so with Morgan Espinoza.  She is awful sweet.  She has a strong faith.  I will plan to  see her back myself in January.  At that point in time, she should be done with her radiation.  I believe that Zometa will be helpful on a routine basis whenever we see her.    ______________________________ Morgan Espinoza, M.D. PRE/MEDQ  D:  07/23/2012  T:  07/24/2012  Job:  [id number]

## 2012-07-24 NOTE — Telephone Encounter (Signed)
Needs OV to discuss this medication

## 2012-07-25 ENCOUNTER — Ambulatory Visit (INDEPENDENT_AMBULATORY_CARE_PROVIDER_SITE_OTHER): Payer: BC Managed Care – PPO | Admitting: Emergency Medicine

## 2012-07-25 VITALS — BP 145/84 | HR 74 | Temp 97.9°F | Resp 18 | Ht 61.0 in | Wt 220.8 lb

## 2012-07-25 DIAGNOSIS — IMO0001 Reserved for inherently not codable concepts without codable children: Secondary | ICD-10-CM

## 2012-07-25 DIAGNOSIS — G35 Multiple sclerosis: Secondary | ICD-10-CM

## 2012-07-25 DIAGNOSIS — I10 Essential (primary) hypertension: Secondary | ICD-10-CM

## 2012-07-25 DIAGNOSIS — K219 Gastro-esophageal reflux disease without esophagitis: Secondary | ICD-10-CM

## 2012-07-25 DIAGNOSIS — H9209 Otalgia, unspecified ear: Secondary | ICD-10-CM

## 2012-07-25 DIAGNOSIS — C9000 Multiple myeloma not having achieved remission: Secondary | ICD-10-CM

## 2012-07-25 DIAGNOSIS — D649 Anemia, unspecified: Secondary | ICD-10-CM

## 2012-07-25 DIAGNOSIS — G35D Multiple sclerosis, unspecified: Secondary | ICD-10-CM

## 2012-07-25 DIAGNOSIS — H9202 Otalgia, left ear: Secondary | ICD-10-CM

## 2012-07-25 MED ORDER — AMLODIPINE BESYLATE 10 MG PO TABS: ORAL_TABLET | ORAL | Status: DC

## 2012-07-25 MED ORDER — OMEPRAZOLE 20 MG PO CPDR: 20.0000 mg | DELAYED_RELEASE_CAPSULE | Freq: Every day | ORAL | Status: DC

## 2012-07-25 MED ORDER — LOSARTAN POTASSIUM 50 MG PO TABS: 50.0000 mg | ORAL_TABLET | Freq: Every morning | ORAL | Status: DC

## 2012-07-25 NOTE — Progress Notes (Signed)
  Subjective:    Patient ID: Morgan Espinoza, female    DOB: [birth year], 61 y.o.   MRN: [id number]  HPI  Pt states her heartbeat is being heard in her ear. She states it started about 4 this morning. She has recently been diagnosed with multiple myeloma. SHe is going to do 5 radiation treatments for this. She has some mild pain left anterior chest where she has a plasmacytoma in her left second rib but mainly at the biopsy site. She is under the care of Dr. in November and he is scheduled her for radiation treatment.   Review of Systems     Objective:   Physical Exam the TMs are clear. The nose is normal. Throat is normal. Neck is supple without adenopathy and there are no carotid bruits her chest is clear to auscultation. Her cardiac exam is unremarkable .        Assessment & Plan:  Patient was reassured her ears normal and she is most likely hearing transmitted pulsations from the carotid due to bone thinning in the skull. I encouraged her to contact me after she completes her radiation treatments to give me a progress report about how she is doing.

## 2012-07-26 ENCOUNTER — Encounter: Payer: Self-pay | Admitting: Radiation Oncology

## 2012-07-26 DIAGNOSIS — E559 Vitamin D deficiency, unspecified: Secondary | ICD-10-CM | POA: Insufficient documentation

## 2012-07-26 DIAGNOSIS — M7660 Achilles tendinitis, unspecified leg: Secondary | ICD-10-CM | POA: Insufficient documentation

## 2012-07-26 DIAGNOSIS — E785 Hyperlipidemia, unspecified: Secondary | ICD-10-CM | POA: Insufficient documentation

## 2012-07-26 DIAGNOSIS — D649 Anemia, unspecified: Secondary | ICD-10-CM | POA: Insufficient documentation

## 2012-07-26 DIAGNOSIS — R42 Dizziness and giddiness: Secondary | ICD-10-CM | POA: Insufficient documentation

## 2012-07-26 DIAGNOSIS — M1710 Unilateral primary osteoarthritis, unspecified knee: Secondary | ICD-10-CM | POA: Insufficient documentation

## 2012-07-26 DIAGNOSIS — G35 Multiple sclerosis: Secondary | ICD-10-CM | POA: Insufficient documentation

## 2012-07-26 DIAGNOSIS — E669 Obesity, unspecified: Secondary | ICD-10-CM | POA: Insufficient documentation

## 2012-07-27 ENCOUNTER — Ambulatory Visit
Admission: RE | Admit: 2012-07-27 | Discharge: 2012-07-27 | Disposition: A | Discharge status: Home / Self Care | Payer: BC Managed Care – PPO | Source: Ambulatory Visit | Attending: Radiation Oncology | Admitting: Radiation Oncology

## 2012-07-27 ENCOUNTER — Encounter: Payer: Self-pay | Admitting: Radiation Oncology

## 2012-07-27 VITALS — BP 145/75 | HR 88 | Temp 98.7°F | Resp 20 | Ht 61.0 in | Wt 220.0 lb

## 2012-07-27 DIAGNOSIS — C9000 Multiple myeloma not having achieved remission: Secondary | ICD-10-CM

## 2012-07-27 DIAGNOSIS — C9030 Solitary plasmacytoma not having achieved remission: Secondary | ICD-10-CM | POA: Insufficient documentation

## 2012-07-27 DIAGNOSIS — G35 Multiple sclerosis: Secondary | ICD-10-CM | POA: Insufficient documentation

## 2012-07-27 DIAGNOSIS — M899 Disorder of bone, unspecified: Secondary | ICD-10-CM

## 2012-07-27 HISTORY — DX: Malignant (primary) neoplasm, unspecified: C80.1

## 2012-07-27 HISTORY — DX: Multiple myeloma not having achieved remission: C90.00

## 2012-07-27 LAB — IGG, IGA, IGM
IgA: 213 mg/dL (ref 69–380)
IgM, Serum: 75 mg/dL (ref 52–322)

## 2012-07-27 LAB — PROTEIN ELECTROPHORESIS, SERUM, WITH REFLEX
Alpha-1-Globulin: 5.2 % — ABNORMAL HIGH (ref 2.9–4.9)
Alpha-2-Globulin: 10.2 % (ref 7.1–11.8)
M-Spike, %: 0.69 g/dL
Total Protein, Serum Electrophoresis: 7.2 g/dL (ref 6.0–8.3)

## 2012-07-27 LAB — IRON AND TIBC
%SAT: 19 % — ABNORMAL LOW (ref 20–55)
Iron: 60 ug/dL (ref 42–145)
TIBC: 316 ug/dL (ref 250–470)

## 2012-07-27 LAB — FERRITIN: Ferritin: 149 ng/mL (ref 10–291)

## 2012-07-27 NOTE — Progress Notes (Signed)
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 female who has a past medical history notable for multiple sclerosis. She denies any recent flares. Months ago, the patient reported some straining with bowel movements. This was followed by some persistent pain in her left lower rib cage. She reported this to her doctor who ordered a chest x-ray on 04/18/2012. This demonstrated a new left upper lung asymmetry on the frontal view. This was worked up further with a CT scan of her chest. The CT scan revealed a new expansile lytic bone lesion in the left anterior second rib. No other significant abnormality in the thorax. Bone survey on 05/06/2012 also showed subtle subcentimeter foci of lucencies in the temporal and occipital skull. Biopsy of the rib rib on 05/17/2012 demonstrated plasma cell myeloma. Her protein studies showed a monoclonal spike of 0.62 g/dL.  IgG level was 1770 mg/dL and kappa light chain was 2.01 mg/dL. The patient was lost to followup for several weeks but did eventually see medical oncology, Dr. Myna Espinoza,  to discuss her workup results. 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 However, he is not sure if she truly has an actual myeloma versus plasmacytoma. Due to her overall health and performance status, he does not want to be terribly aggressive with her workup. She's not felt to be an ideal candidate for aggressive systemic therapy. However, she will probably receive Zometa.  She is asymptomatic from her disease. She denies any rib pain or bone pain elsewhere. No unintended weight loss. No other deviations from her usual state of health.  She is able to drive herself and ambulate independently. She lives by herself. She has brothers in the area. Her daughter recently moved to [place] from [place]. In February, the patient's mother will turn 34. We talked about how the patient would like to apply for a letter from the [employer] to celebrate her mother's birthday. She has not been able to ask for one in the past. She does not have a computer to get this information.  I printed out some directions on the Internet to help facilitate this.   PAST MEDICAL HISTORY:  has a past medical history of Hypertension; GERD (gastroesophageal reflux disease); Asthma; Multiple sclerosis; Hyperlipidemia; Vertigo; Vitamin D deficiency; Obesity; Achilles tendonitis; OA (osteoarthritis) of knee; Anemia, iron deficiency (07/23/2012); Plasma cell myeloma (05/17/12); and Cancer (05/2012).    PAST SURGICAL HISTORY: Past Surgical History  Procedure Date  . Cholecystectomy     lap choley  . Abdominal hysterectomy     fibroids  . Breast surgery     left breast biopsy-benign  . Rotator cuff repair   . Bone biopsy 05/17/12    Left 2nd Rib, Plasma Cell Myeloma    FAMILY HISTORY: family history includes Cancer in her brother and father; Dementia in her mother; Diabetes in her sisters; and Hypertension in her mother.  SOCIAL HISTORY:  reports that she quit smoking about 33 years ago. Her smoking use included Cigarettes. She smoked .1 packs per day. She has never used smokeless tobacco. She reports that she does not drink alcohol or use illicit drugs.  ALLERGIES: Dristan; Robitussin (alcohol free); Ropinirole hcl; and Tramadol  MEDICATIONS:  Current Outpatient Prescriptions  Medication Sig Dispense Refill  . albuterol (PROVENTIL HFA;VENTOLIN HFA) 108 (90 BASE) MCG/ACT inhaler Inhale into the lungs. As needed only      . amLODipine (NORVASC) 10 MG tablet Take one tablet daily for hypertension  30 tablet  11  . ciprofloxacin (CILOXAN) 0.3  % ophthalmic ointment Place into both eyes QID.      . ferrous sulfate 325 (65 FE) MG tablet Take 1 tablet (325 mg total) by mouth daily with breakfast.  30 tablet  11  . fluticasone (FLONASE) 50 MCG/ACT nasal spray Place 2 sprays into the nose daily.      Marland Espinoza HYDROcodone-acetaminophen (NORCO/VICODIN) 5-325 MG per tablet Take 1 tablet by mouth every 6 (six) hours as needed.      . interferon beta-1b (BETASERON) 0.3 MG injection Inject 0.25 mg into the skin every other day.      . losartan (COZAAR) 50 MG tablet Take 1 tablet (50 mg total) by mouth every morning.  30 tablet  11  . meloxicam (MOBIC) 15 MG tablet       . methocarbamol (ROBAXIN) 750 MG tablet Take 750 mg by mouth 4 (four) times daily.      . naproxen (NAPROSYN) 250 MG tablet Take 250 mg by mouth 2 (two) times daily with a meal. Prn for extreme pain      . omeprazole (PRILOSEC) 20 MG capsule Take 1 capsule (20 mg total) by mouth daily.  30 capsule  11  . oxyCODONE-acetaminophen (PERCOCET/ROXICET) 5-325 MG per tablet Take 1 tablet by mouth every 4 (four) hours as needed.      . ranitidine (ZANTAC) 150 MG tablet Take 1 tablet (150 mg total) by mouth 2 (two) times daily.  60 tablet  3   Current Facility-Administered Medications  Medication Dose Route Frequency Provider Last Rate Last Dose  . cyanocobalamin ((VITAMIN B-12)) injection 1,000 mcg  1,000 mcg Intramuscular Q30 days Morgan Simmonds, PA-C   1,000 mcg at 02/04/12 1200    REVIEW OF SYSTEMS: is notable for that above   PHYSICAL EXAM:  height is 5\' 1"  (1.549 m) and weight is 220 lb (99.791 kg). Her oral temperature is 98.7 F (37.1 C). Her blood pressure is 145/75 and her pulse is 88. Her respiration is 20.   General: Alert and oriented, in no acute distress HEENT: Head is normocephalic. Pupils are equally round and reactive to light. Extraocular movements are intact. Oropharynx is clear. Neck: Neck is supple, no palpable cervical or supraclavicular lymphadenopathy. Heart:  Regular in rate and rhythm with no murmurs, rubs, or gallops. Chest: Clear to auscultation bilaterally, with no rhonchi, wheezes, or rales. Abdomen: Soft, nontender, nondistended, with no rigidity or guarding. Extremities: No cyanosis; there is trace edema in her ankles Lymphatics: No concerning lymphadenopathy. Skin: No concerning lesions. Musculoskeletal: Ambulates independently Neurologic: Cranial nerves II through XII are grossly intact. No obvious focalities. Speech is fluent. Coordination is intact. She seems to be a bit slow at processing information, but is able to understand me and our discussion. Psychiatric: Judgment and insight are intact. Affect is appropriate.   LABORATORY DATA:  Lab Results  Component Value Date   WBC 4.9 07/23/2012   HGB 11.4* 07/23/2012   HCT 35.3 07/23/2012   MCV 92 07/23/2012   PLT Clumped Platelets--Appears Adequate 07/23/2012   CMP     Component Value Date/Time   NA 141 05/06/2012 0949  K 4.0 05/06/2012 0949   CL 107 05/06/2012 0949   CO2 28 05/06/2012 0949   GLUCOSE 86 05/06/2012 0949   BUN 17 05/06/2012 0949   CREATININE 0.67 05/06/2012 0949   CREATININE 0.71 04/28/2012 1109   CALCIUM 9.3 05/06/2012 0949   PROT 7.5 05/06/2012 0949   ALBUMIN 4.2 05/06/2012 0949   AST 29 05/06/2012 0949   ALT 24 05/06/2012 0949   ALKPHOS 99 05/06/2012 0949   BILITOT 0.4 05/06/2012 0949   GFRNONAA 89* 06/05/2011 1130   GFRAA >90 06/05/2011 1130        RADIOGRAPHY: as above    IMPRESSION/PLAN: This is a delightful 61 year old woman with a plasmacytoma; 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  I do think it is appropriate to treat her plasmacytoma with external beam radiotherapy for local control. To allow for durable local control in the setting of limited disease,  I recommend treating the lesion to 50.4 gray in  28 fractions. We spoke about acute fatigue and skin irritation as well as rare injury to the bone and lung. I think she'll tolerate treatment well. She is scheduled for CT simulation on December 20; we will start her radiotherapy after the Christmas holiday.  She is enthusiastic about proceeding.  I spent 60 minutes minutes face to face with the patient and more than 50% of that time was spent in counseling and/or coordination of care.    __________________________________________   Lonie Peak, MD

## 2012-07-27 NOTE — Progress Notes (Signed)
Pt denies pain, fatigue, loss of appetite. She states her left rib where biopsy was done is not painful at all.

## 2012-07-27 NOTE — Addendum Note (Signed)
Encounter addended by: Isabel Freese Mintz Amire Leazer, RN on: 07/27/2012  5:42 PM<BR>     Documentation filed: Charges VN

## 2012-07-27 NOTE — Progress Notes (Signed)
Please see the Nurse Progress Note in the MD Initial Consult Encounter for this patient. 

## 2012-07-27 NOTE — Addendum Note (Signed)
Encounter addended by: Glennie Hawk, RN on: 07/27/2012  9:50 AM<BR>     Documentation filed: Charges VN

## 2012-07-27 NOTE — Patient Instructions (Signed)
See you on Friday! Check in at 8:45 am for Radiation Planning in the upstairs lobby at the [facility]. Call [phone] with any questions.

## 2012-07-29 ENCOUNTER — Ambulatory Visit (HOSPITAL_BASED_OUTPATIENT_CLINIC_OR_DEPARTMENT_OTHER): Payer: BC Managed Care – PPO

## 2012-07-29 ENCOUNTER — Encounter: Payer: Self-pay | Admitting: Hematology & Oncology

## 2012-07-29 VITALS — BP 156/91 | HR 72 | Temp 97.3°F | Resp 20

## 2012-07-29 DIAGNOSIS — D509 Iron deficiency anemia, unspecified: Secondary | ICD-10-CM

## 2012-07-29 DIAGNOSIS — C9000 Multiple myeloma not having achieved remission: Secondary | ICD-10-CM

## 2012-07-29 DIAGNOSIS — D480 Neoplasm of uncertain behavior of bone and articular cartilage: Secondary | ICD-10-CM

## 2012-07-29 MED ORDER — SODIUM CHLORIDE 0.9 % IV SOLN
Freq: Once | INTRAVENOUS | Status: AC
  Administered 2012-07-29: 13:00:00 via INTRAVENOUS

## 2012-07-29 MED ORDER — ZOLEDRONIC ACID 4 MG/5ML IV CONC
4.0000 mg | Freq: Once | INTRAVENOUS | Status: AC
  Administered 2012-07-29: 4 mg via INTRAVENOUS
  Filled 2012-07-29: qty 5

## 2012-07-29 MED ORDER — SODIUM CHLORIDE 0.9 % IV SOLN
1020.0000 mg | Freq: Once | INTRAVENOUS | Status: AC
  Administered 2012-07-29: 1020 mg via INTRAVENOUS
  Filled 2012-07-29: qty 34

## 2012-07-29 NOTE — Patient Instructions (Addendum)
fFerumoxytol injection What is this medicine? FERUMOXYTOL is an iron complex. Iron is used to make healthy red blood cells, which carry oxygen and nutrients throughout the body. This medicine is used to treat iron deficiency anemia in people with chronic kidney disease. This medicine may be used for other purposes; ask your health care provider or pharmacist if you have questions. What should I tell my health care provider before I take this medicine? They need to know if you have any of these conditions: -anemia not caused by low iron levels -high levels of iron in the blood -magnetic resonance imaging (MRI) test scheduled -an unusual or allergic reaction to iron, other medicines, foods, dyes, or preservatives -pregnant or trying to get pregnant -breast-feeding How should I use this medicine? This medicine is for infusion into a vein. It is given by a health care professional in a hospital or clinic setting. Talk to your pediatrician regarding the use of this medicine in children. Special care may be needed. Overdosage: If you think you've taken too much of this medicine contact a poison control center or emergency room at once. Overdosage: If you think you have taken too much of this medicine contact a poison control center or emergency room at once. NOTE: This medicine is only for you. Do not share this medicine with others. What if I miss a dose? It is important not to miss your dose. Call your doctor or health care professional if you are unable to keep an appointment. What may interact with this medicine? This medicine may interact with the following medications: -other iron products This list may not describe all possible interactions. Give your health care provider a list of all the medicines, herbs, non-prescription drugs, or dietary supplements you use. Also tell them if you smoke, drink alcohol, or use illegal drugs. Some items may interact with your medicine. What should I watch  for while using this medicine? Visit your doctor or healthcare professional regularly. Tell your doctor or healthcare professional if your symptoms do not start to get better or if they get worse. You may need blood work done while you are taking this medicine. You may need to follow a special diet. Talk to your doctor. Foods that contain iron include: whole grains/cereals, dried fruits, beans, or peas, leafy green vegetables, and organ meats (liver, kidney). What side effects may I notice from receiving this medicine? Side effects that you should report to your doctor or health care professional as soon as possible: -allergic reactions like skin rash, itching or hives, swelling of the face, lips, or tongue -breathing problems -changes in blood pressure -feeling faint or lightheaded, falls -fever or chills -flushing, sweating, or hot feelings -swelling of the ankles or feet Side effects that usually do not require medical attention (Report these to your doctor or health care professional if they continue or are bothersome.): -diarrhea -headache -nausea, vomiting -stomach pain This list may not describe all possible side effects. Call your doctor for medical advice about side effects. You may report side effects to FDA at 1-800-FDA-1088. Where should I keep my medicine? This drug is given in a hospital or clinic and will not be stored at home. NOTE: This sheet is a summary. It may not cover all possible information. If you have questions about this medicine, talk to your doctor, pharmacist, or health care provider.  2012, Elsevier/Gold Standard. (04/19/2008 9:48:25 PM)Zoledronic Acid injection (Hypercalcemia, Oncology) What is this medicine? ZOLEDRONIC ACID (ZOE le dron ik AS id) lowers  the amount of calcium loss from bone. It is used to treat too much calcium in your blood from cancer. It is also used to prevent complications of cancer that has spread to the bone. This medicine may be used for  other purposes; ask your health care provider or pharmacist if you have questions. What should I tell my health care provider before I take this medicine? They need to know if you have any of these conditions: -aspirin-sensitive asthma -dental disease -kidney disease -an unusual or allergic reaction to zoledronic acid, other medicines, foods, dyes, or preservatives -pregnant or trying to get pregnant -breast-feeding How should I use this medicine? This medicine is for infusion into a vein. It is given by a health care professional in a hospital or clinic setting. Talk to your pediatrician regarding the use of this medicine in children. Special care may be needed. Overdosage: If you think you have taken too much of this medicine contact a poison control center or emergency room at once. NOTE: This medicine is only for you. Do not share this medicine with others. What if I miss a dose? It is important not to miss your dose. Call your doctor or health care professional if you are unable to keep an appointment. What may interact with this medicine? -certain antibiotics given by injection -NSAIDs, medicines for pain and inflammation, like ibuprofen or naproxen -some diuretics like bumetanide, furosemide -teriparatide -thalidomide This list may not describe all possible interactions. Give your health care provider a list of all the medicines, herbs, non-prescription drugs, or dietary supplements you use. Also tell them if you smoke, drink alcohol, or use illegal drugs. Some items may interact with your medicine. What should I watch for while using this medicine? Visit your doctor or health care professional for regular checkups. It may be some time before you see the benefit from this medicine. Do not stop taking your medicine unless your doctor tells you to. Your doctor may order blood tests or other tests to see how you are doing. Women should inform their doctor if they wish to become pregnant  or think they might be pregnant. There is a potential for serious side effects to an unborn child. Talk to your health care professional or pharmacist for more information. You should make sure that you get enough calcium and vitamin D while you are taking this medicine. Discuss the foods you eat and the vitamins you take with your health care professional. Some people who take this medicine have severe bone, joint, and/or muscle pain. This medicine may also increase your risk for a broken thigh bone. Tell your doctor right away if you have pain in your upper leg or groin. Tell your doctor if you have any pain that does not go away or that gets worse. What side effects may I notice from receiving this medicine? Side effects that you should report to your doctor or health care professional as soon as possible: -allergic reactions like skin rash, itching or hives, swelling of the face, lips, or tongue -anxiety, confusion, or depression -breathing problems -changes in vision -feeling faint or lightheaded, falls -jaw burning, cramping, pain -muscle cramps, stiffness, or weakness -trouble passing urine or change in the amount of urine Side effects that usually do not require medical attention (report to your doctor or health care professional if they continue or are bothersome): -bone, joint, or muscle pain -fever -hair loss -irritation at site where injected -loss of appetite -nausea, vomiting -stomach upset -tired This  list may not describe all possible side effects. Call your doctor for medical advice about side effects. You may report side effects to FDA at 1-800-FDA-1088. Where should I keep my medicine? This drug is given in a hospital or clinic and will not be stored at home. NOTE: This sheet is a summary. It may not cover all possible information. If you have questions about this medicine, talk to your doctor, pharmacist, or health care provider.  2012, Elsevier/Gold Standard.  (01/24/2011 9:06:58 AM)

## 2012-07-30 ENCOUNTER — Ambulatory Visit
Admission: RE | Admit: 2012-07-30 | Discharge: 2012-07-30 | Disposition: A | Discharge status: Home / Self Care | Payer: BC Managed Care – PPO | Source: Ambulatory Visit | Attending: Radiation Oncology | Admitting: Radiation Oncology

## 2012-07-30 DIAGNOSIS — G35 Multiple sclerosis: Secondary | ICD-10-CM | POA: Insufficient documentation

## 2012-07-30 DIAGNOSIS — C9030 Solitary plasmacytoma not having achieved remission: Secondary | ICD-10-CM | POA: Insufficient documentation

## 2012-07-30 DIAGNOSIS — Z510 Encounter for antineoplastic radiation therapy: Secondary | ICD-10-CM | POA: Insufficient documentation

## 2012-07-30 NOTE — Progress Notes (Signed)
Simulation / Treatment Planning Note  The patient has a diagnosis left rib plasmacytoma. She will receive 3D conformal radiotherapy. The patient was laid in the supine position on the treatment table with her arms over her head. Her head was in an Accuform device. High-resolution CT axial imaging was obtained of the patient's chest. An isocenter was placed in her tumor. Skin markings were made and she tolerated the procedure well without any complications.   Treatment planning note: the patient will be treated with a 3 field plan using MLCs for custom blocks. I plan to prescribe 50.4 Gray in 28 fractions to the plasmcytoma. 3-D conformal radiotherapy will be used to spare her normal structures: I've asked for a DVH of her heart, lungs, and treatment volumes. Daily cone Beam CT imaging will be used to allow for tight margins on her treatment volumes and normal tissue sparing.  -----------------------------------  Lonie Peak, MD

## 2012-07-30 NOTE — Addendum Note (Signed)
Encounter addended by: Delynn Flavin, RN on: 07/30/2012  6:18 PM<BR>     Documentation filed: Charges VN

## 2012-08-05 ENCOUNTER — Ambulatory Visit (INDEPENDENT_AMBULATORY_CARE_PROVIDER_SITE_OTHER): Payer: BC Managed Care – PPO

## 2012-08-05 ENCOUNTER — Ambulatory Visit: Payer: BC Managed Care – PPO | Admitting: *Deleted

## 2012-08-05 ENCOUNTER — Ambulatory Visit: Payer: BC Managed Care – PPO

## 2012-08-05 MED ORDER — CYANOCOBALAMIN 1000 MCG/ML IJ SOLN
1000.0000 ug | Freq: Once | INTRAMUSCULAR | Status: AC
  Administered 2012-08-05: 1000 ug via INTRAMUSCULAR

## 2012-08-10 ENCOUNTER — Ambulatory Visit
Admission: RE | Admit: 2012-08-10 | Discharge: 2012-08-10 | Disposition: A | Discharge status: Home / Self Care | Payer: BC Managed Care – PPO | Source: Ambulatory Visit | Attending: Radiation Oncology | Admitting: Radiation Oncology

## 2012-08-10 DIAGNOSIS — C9030 Solitary plasmacytoma not having achieved remission: Secondary | ICD-10-CM

## 2012-08-12 ENCOUNTER — Ambulatory Visit
Admission: RE | Admit: 2012-08-12 | Discharge: 2012-08-12 | Disposition: A | Discharge status: Home / Self Care | Payer: BC Managed Care – PPO | Source: Ambulatory Visit | Attending: Radiation Oncology | Admitting: Radiation Oncology

## 2012-08-13 ENCOUNTER — Ambulatory Visit
Admission: RE | Admit: 2012-08-13 | Discharge: 2012-08-13 | Disposition: A | Discharge status: Home / Self Care | Payer: BC Managed Care – PPO | Source: Ambulatory Visit | Attending: Radiation Oncology | Admitting: Radiation Oncology

## 2012-08-16 ENCOUNTER — Ambulatory Visit
Admission: RE | Admit: 2012-08-16 | Discharge: 2012-08-16 | Disposition: A | Discharge status: Home / Self Care | Payer: BC Managed Care – PPO | Source: Ambulatory Visit | Attending: Radiation Oncology | Admitting: Radiation Oncology

## 2012-08-16 ENCOUNTER — Encounter: Payer: Self-pay | Admitting: Radiation Oncology

## 2012-08-16 VITALS — BP 124/71 | HR 72 | Temp 98.6°F | Wt 220.9 lb

## 2012-08-16 DIAGNOSIS — C9030 Solitary plasmacytoma not having achieved remission: Secondary | ICD-10-CM

## 2012-08-16 MED ORDER — RADIAPLEXRX EX GEL
Freq: Once | CUTANEOUS | Status: AC
  Administered 2012-08-16: 1 via TOPICAL

## 2012-08-16 NOTE — Progress Notes (Signed)
   Weekly Management Note:  Outpatient Current Dose:  5.4 Gy  Projected Dose: 50.4 Gy   Narrative:  The patient presents for routine under treatment assessment.  CBCT/MVCT images/Port film x-rays were reviewed.  The chart was checked. No complaints  Physical Findings:  weight is 220 lb 14.4 oz (100.2 kg). Her temperature is 98.6 F (37 C). Her blood pressure is 124/71 and her pulse is 72.  No skin irritation over chest.  Impression:  The patient is tolerating radiotherapy.  Plan:  Continue radiotherapy as planned.  ________________________________   Lonie Peak, M.D.

## 2012-08-16 NOTE — Progress Notes (Signed)
3 fractions to left rib region.  Denies nay pain today. Education today regarding pain management, skin care, and fatigue.  Given the Radiation Therapy and You booklet with pages marked.  Also reviewed her diet and emphasized protein to promote tissue cell repair.  She stated underrstanding of information.  Given Radiaplex Gel to apply to marked area on left breast region with instructions to use our physician recommended Dove sensitive skin soap which she revealed she already uses.  Seen by Dr. Basilio Cairo today.

## 2012-08-17 ENCOUNTER — Ambulatory Visit: Payer: BC Managed Care – PPO

## 2012-08-18 ENCOUNTER — Ambulatory Visit
Admission: RE | Admit: 2012-08-18 | Discharge: 2012-08-18 | Disposition: A | Discharge status: Home / Self Care | Payer: BC Managed Care – PPO | Source: Ambulatory Visit | Attending: Radiation Oncology | Admitting: Radiation Oncology

## 2012-08-19 ENCOUNTER — Ambulatory Visit
Admission: RE | Admit: 2012-08-19 | Discharge: 2012-08-19 | Disposition: A | Discharge status: Home / Self Care | Payer: BC Managed Care – PPO | Source: Ambulatory Visit | Attending: Radiation Oncology | Admitting: Radiation Oncology

## 2012-08-20 ENCOUNTER — Ambulatory Visit
Admission: RE | Admit: 2012-08-20 | Discharge: 2012-08-20 | Disposition: A | Discharge status: Home / Self Care | Payer: BC Managed Care – PPO | Source: Ambulatory Visit | Attending: Radiation Oncology | Admitting: Radiation Oncology

## 2012-08-23 ENCOUNTER — Ambulatory Visit
Admission: RE | Admit: 2012-08-23 | Discharge: 2012-08-23 | Disposition: A | Discharge status: Home / Self Care | Payer: BC Managed Care – PPO | Source: Ambulatory Visit | Attending: Radiation Oncology | Admitting: Radiation Oncology

## 2012-08-23 ENCOUNTER — Encounter: Payer: Self-pay | Admitting: Radiation Oncology

## 2012-08-23 VITALS — BP 145/79 | HR 70 | Temp 98.4°F | Resp 20 | Wt 220.9 lb

## 2012-08-23 DIAGNOSIS — C9000 Multiple myeloma not having achieved remission: Secondary | ICD-10-CM

## 2012-08-23 DIAGNOSIS — M899 Disorder of bone, unspecified: Secondary | ICD-10-CM

## 2012-08-23 DIAGNOSIS — C9030 Solitary plasmacytoma not having achieved remission: Secondary | ICD-10-CM

## 2012-08-23 MED ORDER — RADIAPLEXRX EX GEL
Freq: Once | CUTANEOUS | Status: AC
  Administered 2012-08-23: 10:00:00 via TOPICAL

## 2012-08-23 MED ORDER — ALRA NON-METALLIC DEODORANT (RAD-ONC)
1.0000 "application " | Freq: Once | TOPICAL | Status: AC
  Administered 2012-08-23: 1 via TOPICAL

## 2012-08-23 NOTE — Addendum Note (Signed)
Encounter addended by: Glennie Hawk, RN on: 08/23/2012  9:52 AM<BR>     Documentation filed: Inpatient MAR, Orders

## 2012-08-23 NOTE — Progress Notes (Signed)
Pt denies pain today but states Sat/Sun she had throbbing pain in her left shoulder down to wrist and left thigh down to ankle. She did not take any Tylenol or prn meds for this. She states she only takes her prn pain meds "when it hurts a lot". Pt denies loss of appetite, routinely takes nap daily. Gave pt Radiaplex, Alra and reinforced when she is to apply these. Pt verbalized understanding.

## 2012-08-23 NOTE — Progress Notes (Signed)
   Weekly Management Note:  outpatient Current Dose:  12.6 Gy  Projected Dose: 50.4 Gy   Narrative:  The patient presents for routine under treatment assessment.  CBCT/MVCT images/Port film x-rays were reviewed.  The chart was checked. She is doing well.  She has had some pain throbbing in her left arm in her left leg which has improved since yesterday. Of note, she does have a history of multiple sclerosis. She denies pain when she raises her arms over her head during treatments   Physical Findings:  weight is 220 lb 14.4 oz (100.2 kg). Her oral temperature is 98.4 F (36.9 C). Her blood pressure is 145/79 and her pulse is 70. Her respiration is 20.  no skin irritation thus far over her left breast/ chest  Impression:  The patient is tolerating radiotherapy.  Plan:  Continue radiotherapy as planned. Continue radiaplex. I told her to let us know if her left sided pain gets worse.  ________________________________   Lonie Peak, M.D.

## 2012-08-24 ENCOUNTER — Ambulatory Visit
Admission: RE | Admit: 2012-08-24 | Discharge: 2012-08-24 | Disposition: A | Discharge status: Home / Self Care | Payer: BC Managed Care – PPO | Source: Ambulatory Visit | Attending: Radiation Oncology | Admitting: Radiation Oncology

## 2012-08-24 NOTE — Progress Notes (Signed)
Patient asking if she can start back with water exercises , "ok per Md,but will check her skin weekly and if skin starts to get irritated, then water exercises will be need to  stop temporarily until rad txs completed " patient gave verbal understanding 9:23 AM

## 2012-08-25 ENCOUNTER — Ambulatory Visit
Admission: RE | Admit: 2012-08-25 | Discharge: 2012-08-25 | Disposition: A | Discharge status: Home / Self Care | Payer: BC Managed Care – PPO | Source: Ambulatory Visit | Attending: Radiation Oncology | Admitting: Radiation Oncology

## 2012-08-26 ENCOUNTER — Ambulatory Visit
Admission: RE | Admit: 2012-08-26 | Discharge: 2012-08-26 | Disposition: A | Discharge status: Home / Self Care | Payer: BC Managed Care – PPO | Source: Ambulatory Visit | Attending: Radiation Oncology | Admitting: Radiation Oncology

## 2012-08-27 ENCOUNTER — Ambulatory Visit
Admission: RE | Admit: 2012-08-27 | Discharge: 2012-08-27 | Disposition: A | Discharge status: Home / Self Care | Payer: BC Managed Care – PPO | Source: Ambulatory Visit | Attending: Radiation Oncology | Admitting: Radiation Oncology

## 2012-08-30 ENCOUNTER — Ambulatory Visit
Admission: RE | Admit: 2012-08-30 | Discharge: 2012-08-30 | Disposition: A | Discharge status: Home / Self Care | Payer: BC Managed Care – PPO | Source: Ambulatory Visit | Attending: Radiation Oncology | Admitting: Radiation Oncology

## 2012-08-30 ENCOUNTER — Encounter: Payer: Self-pay | Admitting: Radiation Oncology

## 2012-08-30 VITALS — BP 132/77 | HR 75 | Temp 98.3°F | Wt 223.2 lb

## 2012-08-30 DIAGNOSIS — C9030 Solitary plasmacytoma not having achieved remission: Secondary | ICD-10-CM

## 2012-08-30 NOTE — Progress Notes (Signed)
12 fractions to left ribs.  Denies any rib pain but c/o pain in her knees which she thinks may be from her MS.  States she is resting at night.  Will bring all of her medications for review tomorrow to ensure accuracy of her record.

## 2012-08-30 NOTE — Progress Notes (Signed)
   Weekly Management Note:  outpatient Current Dose:  21.6 Gy  Projected Dose: 50.4 Gy   Narrative:  The patient presents for routine under treatment assessment.  CBCT/MVCT images/Port film x-rays were reviewed.  The chart was checked. No new complaints related to her treatment.  Physical Findings:  weight is 223 lb 3.2 oz (101.243 kg). Her temperature is 98.3 F (36.8 C). Her blood pressure is 132/77 and her pulse is 75.  left chest/breast demonstrates no skin irritation thus far  Impression:  The patient is tolerating radiotherapy.  Plan:  Continue radiotherapy as planned.  ________________________________   Lonie Peak, M.D.

## 2012-08-31 ENCOUNTER — Telehealth: Payer: Self-pay

## 2012-08-31 ENCOUNTER — Ambulatory Visit
Admission: RE | Admit: 2012-08-31 | Discharge: 2012-08-31 | Disposition: A | Discharge status: Home / Self Care | Payer: BC Managed Care – PPO | Source: Ambulatory Visit | Attending: Radiation Oncology | Admitting: Radiation Oncology

## 2012-08-31 NOTE — Telephone Encounter (Signed)
Patient medication list update as she brought in all medications today.med list printed out and given to patient.

## 2012-09-01 ENCOUNTER — Ambulatory Visit
Admission: RE | Admit: 2012-09-01 | Discharge: 2012-09-01 | Disposition: A | Discharge status: Home / Self Care | Payer: BC Managed Care – PPO | Source: Ambulatory Visit | Attending: Radiation Oncology | Admitting: Radiation Oncology

## 2012-09-02 ENCOUNTER — Ambulatory Visit
Admission: RE | Admit: 2012-09-02 | Discharge: 2012-09-02 | Disposition: A | Discharge status: Home / Self Care | Payer: BC Managed Care – PPO | Source: Ambulatory Visit | Attending: Radiation Oncology | Admitting: Radiation Oncology

## 2012-09-03 ENCOUNTER — Ambulatory Visit
Admission: RE | Admit: 2012-09-03 | Discharge: 2012-09-03 | Disposition: A | Discharge status: Home / Self Care | Payer: BC Managed Care – PPO | Source: Ambulatory Visit | Attending: Radiation Oncology | Admitting: Radiation Oncology

## 2012-09-06 ENCOUNTER — Encounter: Payer: Self-pay | Admitting: Radiation Oncology

## 2012-09-06 ENCOUNTER — Ambulatory Visit (INDEPENDENT_AMBULATORY_CARE_PROVIDER_SITE_OTHER): Payer: BC Managed Care – PPO | Admitting: Family Medicine

## 2012-09-06 ENCOUNTER — Ambulatory Visit
Admission: RE | Admit: 2012-09-06 | Discharge: 2012-09-06 | Disposition: A | Discharge status: Home / Self Care | Payer: BC Managed Care – PPO | Source: Ambulatory Visit | Attending: Radiation Oncology | Admitting: Radiation Oncology

## 2012-09-06 ENCOUNTER — Ambulatory Visit
Admission: RE | Admit: 2012-09-06 | Payer: BC Managed Care – PPO | Source: Ambulatory Visit | Admitting: Radiation Oncology

## 2012-09-06 VITALS — BP 125/80 | HR 82 | Temp 99.2°F | Wt 222.6 lb

## 2012-09-06 DIAGNOSIS — C9000 Multiple myeloma not having achieved remission: Secondary | ICD-10-CM

## 2012-09-06 DIAGNOSIS — C9030 Solitary plasmacytoma not having achieved remission: Secondary | ICD-10-CM

## 2012-09-06 DIAGNOSIS — D510 Vitamin B12 deficiency anemia due to intrinsic factor deficiency: Secondary | ICD-10-CM

## 2012-09-06 DIAGNOSIS — E538 Deficiency of other specified B group vitamins: Secondary | ICD-10-CM

## 2012-09-06 MED ORDER — CYANOCOBALAMIN 1000 MCG/ML IJ SOLN
1000.0000 ug | INTRAMUSCULAR | Status: DC
  Administered 2012-09-06: 1000 ug via INTRAMUSCULAR

## 2012-09-06 NOTE — Progress Notes (Signed)
  Subjective:    Patient ID: Morgan Espinoza, female    DOB: 09/16/1950, 62 y.o.   MRN: [id number]  HPI  Patient here for B12 injection only    Review of Systems     Objective:   Physical Exam        Assessment & Plan:

## 2012-09-06 NOTE — Progress Notes (Signed)
   Weekly Management Note:  outpatient Current Dose:  30.6 Gy  Projected Dose: 50.4 Gy   Narrative:  The patient presents for routine under treatment assessment.  CBCT/MVCT images/Port film x-rays were reviewed.  The chart was checked. Doing well. No complaints  Physical Findings:  weight is 222 lb 9.6 oz (100.971 kg). Her temperature is 99.2 F (37.3 C). Her blood pressure is 125/80 and her pulse is 82.  very mild hyperpigmentation over the sternum and lateral left breast  Impression:  The patient is tolerating radiotherapy.  Plan:  Continue radiotherapy as planned.  ________________________________   Lonie Peak, M.D.

## 2012-09-06 NOTE — Progress Notes (Signed)
17 fractions to her left ribs.  Denies any pain nor fatigue

## 2012-09-07 ENCOUNTER — Ambulatory Visit
Admission: RE | Admit: 2012-09-07 | Discharge: 2012-09-07 | Disposition: A | Discharge status: Home / Self Care | Payer: BC Managed Care – PPO | Source: Ambulatory Visit | Attending: Radiation Oncology | Admitting: Radiation Oncology

## 2012-09-08 ENCOUNTER — Ambulatory Visit: Payer: BC Managed Care – PPO

## 2012-09-08 ENCOUNTER — Ambulatory Visit
Admission: RE | Admit: 2012-09-08 | Discharge: 2012-09-08 | Disposition: A | Discharge status: Home / Self Care | Payer: BC Managed Care – PPO | Source: Ambulatory Visit | Attending: Radiation Oncology | Admitting: Radiation Oncology

## 2012-09-09 ENCOUNTER — Ambulatory Visit
Admission: RE | Admit: 2012-09-09 | Discharge: 2012-09-09 | Disposition: A | Discharge status: Home / Self Care | Payer: BC Managed Care – PPO | Source: Ambulatory Visit | Attending: Radiation Oncology | Admitting: Radiation Oncology

## 2012-09-10 ENCOUNTER — Ambulatory Visit
Admission: RE | Admit: 2012-09-10 | Discharge: 2012-09-10 | Disposition: A | Discharge status: Home / Self Care | Payer: BC Managed Care – PPO | Source: Ambulatory Visit | Attending: Radiation Oncology | Admitting: Radiation Oncology

## 2012-09-13 ENCOUNTER — Ambulatory Visit
Admission: RE | Admit: 2012-09-13 | Discharge: 2012-09-13 | Disposition: A | Discharge status: Home / Self Care | Payer: BC Managed Care – PPO | Source: Ambulatory Visit | Attending: Radiation Oncology | Admitting: Radiation Oncology

## 2012-09-13 ENCOUNTER — Ambulatory Visit (HOSPITAL_BASED_OUTPATIENT_CLINIC_OR_DEPARTMENT_OTHER): Payer: BC Managed Care – PPO

## 2012-09-13 ENCOUNTER — Ambulatory Visit (HOSPITAL_BASED_OUTPATIENT_CLINIC_OR_DEPARTMENT_OTHER): Payer: BC Managed Care – PPO | Admitting: Medical

## 2012-09-13 ENCOUNTER — Other Ambulatory Visit (HOSPITAL_BASED_OUTPATIENT_CLINIC_OR_DEPARTMENT_OTHER): Payer: BC Managed Care – PPO | Admitting: Lab

## 2012-09-13 ENCOUNTER — Telehealth: Payer: Self-pay | Admitting: Hematology & Oncology

## 2012-09-13 ENCOUNTER — Encounter: Payer: Self-pay | Admitting: Radiation Oncology

## 2012-09-13 VITALS — BP 130/83 | HR 76 | Temp 98.3°F | Resp 20 | Wt 221.6 lb

## 2012-09-13 VITALS — BP 137/75 | HR 79 | Temp 98.4°F | Resp 16 | Ht 61.0 in | Wt 220.0 lb

## 2012-09-13 DIAGNOSIS — C801 Malignant (primary) neoplasm, unspecified: Secondary | ICD-10-CM

## 2012-09-13 DIAGNOSIS — D480 Neoplasm of uncertain behavior of bone and articular cartilage: Secondary | ICD-10-CM

## 2012-09-13 DIAGNOSIS — C9000 Multiple myeloma not having achieved remission: Secondary | ICD-10-CM

## 2012-09-13 DIAGNOSIS — C9030 Solitary plasmacytoma not having achieved remission: Secondary | ICD-10-CM

## 2012-09-13 LAB — CMP (CANCER CENTER ONLY)
ALT(SGPT): 28 U/L (ref 10–47)
AST: 34 U/L (ref 11–38)
Calcium: 8.7 mg/dL (ref 8.0–10.3)
Chloride: 108 mEq/L (ref 98–108)
Creat: 0.9 mg/dl (ref 0.6–1.2)
Sodium: 140 mEq/L (ref 128–145)
Total Bilirubin: 0.7 mg/dl (ref 0.20–1.60)

## 2012-09-13 LAB — CBC WITH DIFFERENTIAL (CANCER CENTER ONLY)
BASO%: 0.2 % (ref 0.0–2.0)
Eosinophils Absolute: 0.5 10*3/uL (ref 0.0–0.5)
MONO#: 0.4 10*3/uL (ref 0.1–0.9)
NEUT#: 2.6 10*3/uL (ref 1.5–6.5)
Platelets: 185 10*3/uL (ref 145–400)
RBC: 3.79 10*6/uL (ref 3.70–5.32)
WBC: 4.3 10*3/uL (ref 3.9–10.0)

## 2012-09-13 MED ORDER — RADIAPLEXRX EX GEL
Freq: Once | CUTANEOUS | Status: AC
  Administered 2012-09-13: 11:00:00 via TOPICAL

## 2012-09-13 MED ORDER — SODIUM CHLORIDE 0.9 % IV SOLN
Freq: Once | INTRAVENOUS | Status: AC
  Administered 2012-09-13: 15:00:00 via INTRAVENOUS

## 2012-09-13 MED ORDER — ZOLEDRONIC ACID 4 MG/100ML IV SOLN
4.0000 mg | Freq: Once | INTRAVENOUS | Status: AC
  Administered 2012-09-13: 4 mg via INTRAVENOUS
  Filled 2012-09-13: qty 100

## 2012-09-13 NOTE — Telephone Encounter (Signed)
Per PA ok for pt to be seen 3-25

## 2012-09-13 NOTE — Patient Instructions (Addendum)
Zoledronic Acid injection (Hypercalcemia, Oncology) What is this medicine? ZOLEDRONIC ACID (ZOE le dron ik AS id) lowers the amount of calcium loss from bone. It is used to treat too much calcium in your blood from cancer. It is also used to prevent complications of cancer that has spread to the bone. This medicine may be used for other purposes; ask your health care provider or pharmacist if you have questions. What should I tell my health care provider before I take this medicine? They need to know if you have any of these conditions: -aspirin-sensitive asthma -dental disease -kidney disease -an unusual or allergic reaction to zoledronic acid, other medicines, foods, dyes, or preservatives -pregnant or trying to get pregnant -breast-feeding How should I use this medicine? This medicine is for infusion into a vein. It is given by a health care professional in a hospital or clinic setting. Talk to your pediatrician regarding the use of this medicine in children. Special care may be needed. Overdosage: If you think you have taken too much of this medicine contact a poison control center or emergency room at once. NOTE: This medicine is only for you. Do not share this medicine with others. What if I miss a dose? It is important not to miss your dose. Call your doctor or health care professional if you are unable to keep an appointment. What may interact with this medicine? -certain antibiotics given by injection -NSAIDs, medicines for pain and inflammation, like ibuprofen or naproxen -some diuretics like bumetanide, furosemide -teriparatide -thalidomide This list may not describe all possible interactions. Give your health care provider a list of all the medicines, herbs, non-prescription drugs, or dietary supplements you use. Also tell them if you smoke, drink alcohol, or use illegal drugs. Some items may interact with your medicine. What should I watch for while using this medicine? Visit  your doctor or health care professional for regular checkups. It may be some time before you see the benefit from this medicine. Do not stop taking your medicine unless your doctor tells you to. Your doctor may order blood tests or other tests to see how you are doing. Women should inform their doctor if they wish to become pregnant or think they might be pregnant. There is a potential for serious side effects to an unborn child. Talk to your health care professional or pharmacist for more information. You should make sure that you get enough calcium and vitamin D while you are taking this medicine. Discuss the foods you eat and the vitamins you take with your health care professional. Some people who take this medicine have severe bone, joint, and/or muscle pain. This medicine may also increase your risk for a broken thigh bone. Tell your doctor right away if you have pain in your upper leg or groin. Tell your doctor if you have any pain that does not go away or that gets worse. What side effects may I notice from receiving this medicine? Side effects that you should report to your doctor or health care professional as soon as possible: -allergic reactions like skin rash, itching or hives, swelling of the face, lips, or tongue -anxiety, confusion, or depression -breathing problems -changes in vision -feeling faint or lightheaded, falls -jaw burning, cramping, pain -muscle cramps, stiffness, or weakness -trouble passing urine or change in the amount of urine Side effects that usually do not require medical attention (report to your doctor or health care professional if they continue or are bothersome): -bone, joint, or muscle pain -  fever -hair loss -irritation at site where injected -loss of appetite -nausea, vomiting -stomach upset -tired This list may not describe all possible side effects. Call your doctor for medical advice about side effects. You may report side effects to FDA at  1-800-FDA-1088. Where should I keep my medicine? This drug is given in a hospital or clinic and will not be stored at home. NOTE: This sheet is a summary. It may not cover all possible information. If you have questions about this medicine, talk to your doctor, pharmacist, or health care provider.  2013, [employer]. (01/24/2011 9:06:58 AM)  

## 2012-09-13 NOTE — Progress Notes (Signed)
   Weekly Management Note:  outpatient Current Dose:  39.6 Gy  Projected Dose: 50.4 Gy   Narrative:  The patient presents for routine under treatment assessment.  CBCT/MVCT images/Port film x-rays were reviewed.  The chart was checked. Doing well. Slight hyperpigmentation over her skin. No pain or soreness. Appetite is good. She is very happy because she recently received a letter for her mother's 103rd birthday from the President which I had helped her apply for. She brought it today in a golden picture frame.  Physical Findings:  weight is 221 lb 9.6 oz (100.517 kg). Her oral temperature is 98.3 F (36.8 C). Her blood pressure is 130/83 and her pulse is 76. Her respiration is 20.  mild hyperpigmentation over the mid chest and left axilla, skin intact  Impression:  The patient is tolerating radiotherapy.  Plan:  Continue radiotherapy as planned. Continue radiaplex.  ________________________________   Lonie Peak, M.D.

## 2012-09-13 NOTE — Addendum Note (Signed)
Encounter addended by: Lowella Petties, RN on: 09/13/2012 11:04 AM<BR>     Documentation filed: Visit Diagnoses, Inpatient MAR, Orders

## 2012-09-13 NOTE — Progress Notes (Signed)
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   #2.  Intermittent iron deficiency anemia  Current therapy:  #1.  Zometa 4 mg IV every 6-8 weeks. #2.  Radiation therapy to the, left rib. #3.  IV iron as needed.  Last dose of IV and was in December 2013.  Interim history: Morgan Espinoza presents today for an office followup visit.  Overall, she, reports, that she's doing relatively well.  She still continues to receive radiation therapy to the, left anterior rib.  She had 6 treatments left.  The last myeloma panel.  We got on her was back in December revealed an IgG level 1,580.  IgA 213.  IgM 75.  Her kappa free light chain was 2..3 9 mg/dL, and an M spike of 4.09 g/dL..  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  We have decided to not do any other type of workup on Morgan Espinoza, secondary to her medical conditions, as she would not be a good candidate for systemic chemotherapy.  We do give her Zometa every 6-8 weeks.  We have had to give her IV iron in the past.  She last received IV iron.  Back in December at which time her.  Iron was 60, with 19% saturation.  We will continue to monitor her iron panel.  She otherwise is tolerating the radiation therapy quite well.  She's not reporting any type of bony pain.  She has a good appetite.  She denies any nausea, vomiting, diarrhea, constipation, any chest pain, cough, shortness of breath.  Any fevers, chills, or night sweats.  She denies any obvious, or abnormal bleeding.  She denies any back pain.  She denies any headaches, visual changes, or rashes.  Review of Systems: Constitutional:Negative for malaise/fatigue, fever, chills, weight loss, diaphoresis, activity change, appetite change, and unexpected weight change.  HEENT: Negative for double vision, blurred vision, visual loss, ear pain, tinnitus, congestion, rhinorrhea, epistaxis sore throat or sinus disease, oral pain/lesion, tongue soreness Respiratory: Negative  for cough, chest tightness, shortness of breath, wheezing and stridor.  Cardiovascular: Negative for chest pain, palpitations, leg swelling, orthopnea, PND, DOE or claudication Gastrointestinal: Negative for nausea, vomiting, abdominal pain, diarrhea, constipation, blood in stool, melena, hematochezia, abdominal distention, anal bleeding, rectal pain, anorexia and hematemesis.  Genitourinary: Negative for dysuria, frequency, hematuria,  Musculoskeletal: Negative for myalgias, back pain, joint swelling, arthralgias and gait problem.  Skin: Negative for rash, color change, pallor and wound.  Neurological:. Negative for dizziness/light-headedness, tremors, seizures, syncope, facial asymmetry, speech difficulty, weakness, numbness, headaches and paresthesias.  Hematological: Negative for adenopathy. Does not bruise/bleed easily.  Psychiatric/Behavioral:  Negative for depression, no loss of interest in normal activity or change in sleep pattern.   Physical Exam: This is a pleasant, 62 year old, well-developed, well-nourished Afro-American female, in no obvious distress Vitals: Temperature 98.4 degrees, pulse 79, respirations 16, blood pressure 137/75.  Weight 220 pounds HEENT reveals a normocephalic, atraumatic skull, no scleral icterus, no oral lesions  Neck is supple without any cervical or supraclavicular adenopathy.  Lungs are clear to auscultation bilaterally. There are no wheezes, rales or rhonci Cardiac is regular rate and rhythm with a normal S1 and S2. There are no murmurs, rubs, or bruits.  Abdomen is soft with good bowel sounds, there is no palpable mass. There is no palpable hepatosplenomegaly. There is no palpable fluid wave.  Musculoskeletal no tenderness of the spine, ribs, or hips.  Extremities there are no clubbing, cyanosis, or edema.  Skin no petechia, purpura or ecchymosis Neurologic is nonfocal.  Laboratory Data: White count 4.3, hemoglobin 11.5, hematocrit 35.1, platelets  185,000  Current Outpatient Prescriptions on File Prior to Visit  Medication Sig Dispense Refill  . acetaminophen (TYLENOL) 325 MG tablet Take 650 mg by mouth every 6 (six) hours as needed. Tylenol arthritis      . albuterol (PROVENTIL HFA;VENTOLIN HFA) 108 (90 BASE) MCG/ACT inhaler Inhale into the lungs. As needed only      . amLODipine (NORVASC) 10 MG tablet Take one tablet daily for hypertension  30 tablet  11  . ciprofloxacin (CILOXAN) 0.3 % ophthalmic ointment Place into both eyes QID.      Marland Kitchen docusate sodium (COLACE) 50 MG capsule Take 50 mg by mouth 2 (two) times daily.      . ferrous sulfate 325 (65 FE) MG tablet Take 1 tablet (325 mg total) by mouth daily with breakfast.  30 tablet  11  . fluticasone (FLONASE) 50 MCG/ACT nasal spray Place 2 sprays into the nose daily.      Marland Kitchen HYDROcodone-acetaminophen (NORCO/VICODIN) 5-325 MG per tablet Take 1 tablet by mouth every 6 (six) hours as needed.      . interferon beta-1b (BETASERON) 0.3 MG injection Inject 0.25 mg into the skin every other day.      . losartan (COZAAR) 50 MG tablet Take 1 tablet (50 mg total) by mouth every morning.  30 tablet  11  . meloxicam (MOBIC) 15 MG tablet       . methocarbamol (ROBAXIN) 750 MG tablet Take 750 mg by mouth 3 (three) times daily.       . naproxen sodium (ANAPROX) 550 MG tablet Take 550 mg by mouth 3 (three) times daily with meals.      Marland Kitchen omeprazole (PRILOSEC) 20 MG capsule Take 1 capsule (20 mg total) by mouth daily.  30 capsule  11  . oxyCODONE-acetaminophen (PERCOCET/ROXICET) 5-325 MG per tablet Take 1 tablet by mouth every 4 (four) hours as needed. 1/2 to 1 po at night as needed for pain.      . ranitidine (ZANTAC) 150 MG tablet Take 150 mg by mouth 2 (two) times daily.       Current Facility-Administered Medications on File Prior to Visit  Medication Dose Route Frequency Provider Last Rate Last Dose  . cyanocobalamin ((VITAMIN B-12)) injection 1,000 mcg  1,000 mcg Intramuscular Q30 days Anders Simmonds, PA-C   1,000 mcg at 02/04/12 1200   Assessment/Plan: This is a pleasant, 62 year old, African American female, with the following issues:  #1.  Plasmacytoma.  She is receiving radiation therapy to the, left rib lesion.  She has 6 treatments left to complete.  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  However, it was felt that further workup would not be pursued, as she is not felt to be a good candidate for very aggressive systemic therapies.  We will continue to monitor her myeloma studies.  #2.  Supportive therapy.  She will receive Zometa 4 mg IV every 6-8 weeks.  #3.  Intermittent iron deficiency anemia.  The last, time, she received IV iron was in December.  We are sending off an iron panel on her today.  #4.  Followup.  We will follow back up with Morgan Espinoza in about 6 weeks, but before then should there be questions or concerns.

## 2012-09-13 NOTE — Progress Notes (Signed)
Patient here rad tx 22/28 left rib  Completed, slight hyperpigmentation on area of skin treated, asked for 2nd tube radiaplex gel, using bid, no c/o pain or soreness, eating and drinking well 10:00 AM

## 2012-09-14 ENCOUNTER — Ambulatory Visit
Admission: RE | Admit: 2012-09-14 | Discharge: 2012-09-14 | Disposition: A | Discharge status: Home / Self Care | Payer: BC Managed Care – PPO | Source: Ambulatory Visit | Attending: Radiation Oncology | Admitting: Radiation Oncology

## 2012-09-15 ENCOUNTER — Ambulatory Visit
Admission: RE | Admit: 2012-09-15 | Discharge: 2012-09-15 | Disposition: A | Discharge status: Home / Self Care | Payer: BC Managed Care – PPO | Source: Ambulatory Visit | Attending: Radiation Oncology | Admitting: Radiation Oncology

## 2012-09-15 LAB — PROTEIN ELECTROPHORESIS, SERUM, WITH REFLEX
Albumin ELP: 53.1 % — ABNORMAL LOW (ref 55.8–66.1)
Beta 2: 5.3 % (ref 3.2–6.5)

## 2012-09-15 LAB — IGG, IGA, IGM
IgA: 200 mg/dL (ref 69–380)
IgG (Immunoglobin G), Serum: 1490 mg/dL (ref 690–1700)
IgM, Serum: 70 mg/dL (ref 52–322)

## 2012-09-15 LAB — IRON AND TIBC: UIBC: 214 ug/dL (ref 125–400)

## 2012-09-15 LAB — FERRITIN: Ferritin: 540 ng/mL — ABNORMAL HIGH (ref 10–291)

## 2012-09-16 ENCOUNTER — Ambulatory Visit
Admission: RE | Admit: 2012-09-16 | Discharge: 2012-09-16 | Disposition: A | Discharge status: Home / Self Care | Payer: BC Managed Care – PPO | Source: Ambulatory Visit | Attending: Radiation Oncology | Admitting: Radiation Oncology

## 2012-09-17 ENCOUNTER — Ambulatory Visit
Admission: RE | Admit: 2012-09-17 | Discharge: 2012-09-17 | Disposition: A | Discharge status: Home / Self Care | Payer: BC Managed Care – PPO | Source: Ambulatory Visit | Attending: Radiation Oncology | Admitting: Radiation Oncology

## 2012-09-20 ENCOUNTER — Ambulatory Visit
Admission: RE | Admit: 2012-09-20 | Discharge: 2012-09-20 | Disposition: A | Discharge status: Home / Self Care | Payer: BC Managed Care – PPO | Source: Ambulatory Visit | Attending: Radiation Oncology | Admitting: Radiation Oncology

## 2012-09-20 ENCOUNTER — Encounter: Payer: Self-pay | Admitting: Radiation Oncology

## 2012-09-20 ENCOUNTER — Ambulatory Visit: Payer: BC Managed Care – PPO

## 2012-09-20 VITALS — BP 140/90 | HR 80 | Temp 97.8°F | Wt 226.0 lb

## 2012-09-20 DIAGNOSIS — C9030 Solitary plasmacytoma not having achieved remission: Secondary | ICD-10-CM

## 2012-09-20 NOTE — Progress Notes (Signed)
27 fractions to left ribs.  Denies any pin nor fatigue.  Will complete on tomorrow.

## 2012-09-20 NOTE — Progress Notes (Signed)
   Weekly Management Note 48.6 out of 50.4 Wallace Cullens  Narrative:  The patient presents for routine under treatment assessment on last day of radiotherapy.  CBCT/MVCT images/Port film x-rays were reviewed.  The chart was checked. Doing well. Good energy. Good appetite. Skin is a little darker in the treatment fields  Physical Findings:  weight is 226 lb (102.513 kg). Her temperature is 97.8 F (36.6 C). Her blood pressure is 140/90 and her pulse is 80.  hyperpigmentation a little more noticeable over the upper sternal area in the left axilla. Skin intact  Impression:  The patient has tolerated radiotherapy. 1 more fraction to go.  Plan: Last treatment tomorrow.  Routine follow-up in one month. Continue radiaplex for the next month. ________________________________   Lonie Peak, M.D.

## 2012-09-21 ENCOUNTER — Ambulatory Visit
Admission: RE | Admit: 2012-09-21 | Discharge: 2012-09-21 | Disposition: A | Discharge status: Home / Self Care | Payer: BC Managed Care – PPO | Source: Ambulatory Visit | Attending: Radiation Oncology | Admitting: Radiation Oncology

## 2012-09-21 ENCOUNTER — Encounter: Payer: Self-pay | Admitting: Radiation Oncology

## 2012-09-22 ENCOUNTER — Ambulatory Visit: Payer: BC Managed Care – PPO

## 2012-09-25 ENCOUNTER — Other Ambulatory Visit: Payer: Self-pay | Admitting: Physician Assistant

## 2012-09-25 NOTE — Progress Notes (Signed)
  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  Plan: The patient has completed radiation treatment. The patient will return to radiation oncology clinic for routine followup in one month. I advised them to call or return sooner if they have any questions or concerns related to their recovery or treatment.  -----------------------------------  Lonie Peak, MD

## 2012-10-04 ENCOUNTER — Encounter: Payer: Self-pay | Admitting: Radiation Oncology

## 2012-10-04 NOTE — Addendum Note (Signed)
Encounter addended by: Maryln Gottron, MD on: 10/04/2012  5:34 PM<BR>     Documentation filed: Clinical Notes

## 2012-10-04 NOTE — Progress Notes (Signed)
Simulation Verification Note  - left rib Outpatient, DOS:08-10-12  The patient was brought to the treatment unit and placed in the planned treatment position. The clinical setup was verified. Then port films were obtained and uploaded to the radiation oncology medical record software.  The treatment beams were carefully compared against the planned radiation fields. The position location and shape of the radiation fields was reviewed. They targeted volume of tissue appears to be appropriately covered by the radiation beams. Organs at risk appear to be excluded as planned.  Based on my partner's review (Dr. Dayton Scrape), he approved the simulation verification.  The patient's treatment will proceed as planned.   -----------------------------------  Lonie Peak, MD

## 2012-10-05 ENCOUNTER — Ambulatory Visit: Payer: BC Managed Care – PPO

## 2012-10-05 ENCOUNTER — Ambulatory Visit: Payer: BC Managed Care – PPO | Admitting: Hematology & Oncology

## 2012-10-05 ENCOUNTER — Other Ambulatory Visit: Payer: Self-pay | Admitting: Emergency Medicine

## 2012-10-05 ENCOUNTER — Other Ambulatory Visit: Payer: BC Managed Care – PPO | Admitting: Lab

## 2012-10-08 ENCOUNTER — Ambulatory Visit (INDEPENDENT_AMBULATORY_CARE_PROVIDER_SITE_OTHER): Payer: BC Managed Care – PPO | Admitting: Emergency Medicine

## 2012-10-08 VITALS — BP 140/90 | HR 82 | Temp 98.2°F | Resp 20 | Ht 61.25 in | Wt 222.6 lb

## 2012-10-08 MED ORDER — HYDROCODONE-ACETAMINOPHEN 5-325 MG PO TABS: 1.0000 | ORAL_TABLET | ORAL | Status: DC | PRN

## 2012-10-08 NOTE — Progress Notes (Signed)
  Subjective:    Patient ID: Morgan Espinoza, female    DOB: 1951-05-11, 62 y.o.   MRN: [id number]  HPI patient has been having pain in both legs. She tends to hurt more at night. She has pain in her lower thighs medially and some pain into the medial lower calf bilaterally. There is mild discomfort in the back of her legs.    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 Her total protein was high and she was referred to the oncologist and is receiving radiation treatment to this area. She currently is under the care of Dr. Basilio Cairo who has been doing her radiation treatment     Objective:   Physical Exam there is mild tenderness over the anterior left second rib. Breath sounds are symmetrical. Chest is clear examination of lower extremities revealed varicosities present bilaterally there is mild posterior calf tenderness there is also tenderness bilaterally over the medial thighs and inside of both knees.        Assessment & Plan:  Because of her history of cancer I do feel she should have venous ultrasounds done of her legs to rule out clot. She definitely could be hypercoagulable due to to her plasmacytoma. She was given a prescription for hydrocodone for pain and will do her Doppler next week. She is also due for her B12 shot which was given .

## 2012-10-11 ENCOUNTER — Encounter (HOSPITAL_COMMUNITY): Payer: BC Managed Care – PPO

## 2012-10-11 ENCOUNTER — Other Ambulatory Visit (HOSPITAL_COMMUNITY): Payer: Self-pay | Admitting: Emergency Medicine

## 2012-10-12 ENCOUNTER — Ambulatory Visit (HOSPITAL_COMMUNITY)
Admission: RE | Admit: 2012-10-12 | Discharge: 2012-10-12 | Disposition: A | Discharge status: Home / Self Care | Payer: BC Managed Care – PPO | Source: Ambulatory Visit | Attending: Emergency Medicine | Admitting: Emergency Medicine

## 2012-10-12 DIAGNOSIS — E669 Obesity, unspecified: Secondary | ICD-10-CM

## 2012-10-12 DIAGNOSIS — M79609 Pain in unspecified limb: Secondary | ICD-10-CM | POA: Insufficient documentation

## 2012-10-12 NOTE — Progress Notes (Signed)
Bilateral:  No evidence of DVT, superficial thrombosis, or Baker's Cyst.   

## 2012-10-18 ENCOUNTER — Telehealth: Payer: Self-pay | Admitting: *Deleted

## 2012-10-18 NOTE — Telephone Encounter (Signed)
Patient called  Asking her appt time with Md, informed her appt is for 11/02/12 at 330pm pt verified verbal readback .Marland Kitchennow

## 2012-10-26 ENCOUNTER — Ambulatory Visit: Payer: BC Managed Care – PPO | Admitting: Radiation Oncology

## 2012-10-30 ENCOUNTER — Ambulatory Visit (INDEPENDENT_AMBULATORY_CARE_PROVIDER_SITE_OTHER): Payer: BC Managed Care – PPO | Admitting: Emergency Medicine

## 2012-10-30 VITALS — BP 154/88 | HR 85 | Temp 98.2°F | Resp 18 | Ht 61.5 in | Wt 219.2 lb

## 2012-10-30 DIAGNOSIS — G35 Multiple sclerosis: Secondary | ICD-10-CM

## 2012-10-30 DIAGNOSIS — E538 Deficiency of other specified B group vitamins: Secondary | ICD-10-CM

## 2012-10-30 DIAGNOSIS — J029 Acute pharyngitis, unspecified: Secondary | ICD-10-CM

## 2012-10-30 DIAGNOSIS — R197 Diarrhea, unspecified: Secondary | ICD-10-CM

## 2012-10-30 NOTE — Progress Notes (Signed)
  Subjective:    Patient ID: Morgan Espinoza, female    DOB: [birth year], 62 y.o.   MRN: [id number]  HPI The patient presents with sore throat feeling poor weak and no energy. She's also has been having some loose stools. She has had no abdominal pain or vomiting.   Review of Systems     Objective:   Physical Exam patient is alert and cooperative and in no distress. Her throat is just minimally red. There are no anterior cervical nodes. 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 She is undergoing radiation treatment for this. She is in today with a sore throat and some loose stools. Her exam is unremarkable we'll treat her with fluids Imodium right ear for diarrhea. Went ahead and gave her a B12 shot today. We'll go ahead and check a strep test. Strep test negative we'll treat symptomatically.

## 2012-11-01 ENCOUNTER — Ambulatory Visit (HOSPITAL_BASED_OUTPATIENT_CLINIC_OR_DEPARTMENT_OTHER): Payer: BC Managed Care – PPO | Admitting: Hematology & Oncology

## 2012-11-01 ENCOUNTER — Ambulatory Visit (HOSPITAL_BASED_OUTPATIENT_CLINIC_OR_DEPARTMENT_OTHER): Payer: BC Managed Care – PPO | Admitting: Lab

## 2012-11-01 ENCOUNTER — Ambulatory Visit (HOSPITAL_BASED_OUTPATIENT_CLINIC_OR_DEPARTMENT_OTHER): Payer: BC Managed Care – PPO

## 2012-11-01 VITALS — BP 127/62 | HR 67 | Temp 98.2°F | Resp 16 | Ht 61.0 in | Wt 218.0 lb

## 2012-11-01 DIAGNOSIS — D509 Iron deficiency anemia, unspecified: Secondary | ICD-10-CM

## 2012-11-01 DIAGNOSIS — C9030 Solitary plasmacytoma not having achieved remission: Secondary | ICD-10-CM

## 2012-11-01 DIAGNOSIS — C9000 Multiple myeloma not having achieved remission: Secondary | ICD-10-CM

## 2012-11-01 LAB — CBC WITH DIFFERENTIAL (CANCER CENTER ONLY)
BASO#: 0 10*3/uL (ref 0.0–0.2)
Eosinophils Absolute: 0.4 10*3/uL (ref 0.0–0.5)
HGB: 11.6 g/dL (ref 11.6–15.9)
LYMPH%: 20.9 % (ref 14.0–48.0)
MCH: 30.7 pg (ref 26.0–34.0)
MCV: 94 fL (ref 81–101)
MONO%: 10.9 % (ref 0.0–13.0)
RBC: 3.78 10*6/uL (ref 3.70–5.32)

## 2012-11-01 MED ORDER — ZOLEDRONIC ACID 4 MG/100ML IV SOLN
4.0000 mg | Freq: Once | INTRAVENOUS | Status: AC
  Administered 2012-11-01: 4 mg via INTRAVENOUS
  Filled 2012-11-01: qty 100

## 2012-11-01 MED ORDER — SODIUM CHLORIDE 0.9 % IV SOLN
Freq: Once | INTRAVENOUS | Status: AC
  Administered 2012-11-01: 12:00:00 via INTRAVENOUS

## 2012-11-01 NOTE — Progress Notes (Signed)
This office note has been dictated.

## 2012-11-01 NOTE — Patient Instructions (Addendum)
Zoledronic Acid injection (Hypercalcemia, Oncology) What is this medicine? ZOLEDRONIC ACID (ZOE le dron ik AS id) lowers the amount of calcium loss from bone. It is used to treat too much calcium in your blood from cancer. It is also used to prevent complications of cancer that has spread to the bone. This medicine may be used for other purposes; ask your health care provider or pharmacist if you have questions. What should I tell my health care provider before I take this medicine? They need to know if you have any of these conditions: -aspirin-sensitive asthma -dental disease -kidney disease -an unusual or allergic reaction to zoledronic acid, other medicines, foods, dyes, or preservatives -pregnant or trying to get pregnant -breast-feeding How should I use this medicine? This medicine is for infusion into a vein. It is given by a health care professional in a hospital or clinic setting. Talk to your pediatrician regarding the use of this medicine in children. Special care may be needed. Overdosage: If you think you have taken too much of this medicine contact a poison control center or emergency room at once. NOTE: This medicine is only for you. Do not share this medicine with others. What if I miss a dose? It is important not to miss your dose. Call your doctor or health care professional if you are unable to keep an appointment. What may interact with this medicine? -certain antibiotics given by injection -NSAIDs, medicines for pain and inflammation, like ibuprofen or naproxen -some diuretics like bumetanide, furosemide -teriparatide -thalidomide This list may not describe all possible interactions. Give your health care provider a list of all the medicines, herbs, non-prescription drugs, or dietary supplements you use. Also tell them if you smoke, drink alcohol, or use illegal drugs. Some items may interact with your medicine. What should I watch for while using this medicine? Visit  your doctor or health care professional for regular checkups. It may be some time before you see the benefit from this medicine. Do not stop taking your medicine unless your doctor tells you to. Your doctor may order blood tests or other tests to see how you are doing. Women should inform their doctor if they wish to become pregnant or think they might be pregnant. There is a potential for serious side effects to an unborn child. Talk to your health care professional or pharmacist for more information. You should make sure that you get enough calcium and vitamin D while you are taking this medicine. Discuss the foods you eat and the vitamins you take with your health care professional. Some people who take this medicine have severe bone, joint, and/or muscle pain. This medicine may also increase your risk for a broken thigh bone. Tell your doctor right away if you have pain in your upper leg or groin. Tell your doctor if you have any pain that does not go away or that gets worse. What side effects may I notice from receiving this medicine? Side effects that you should report to your doctor or health care professional as soon as possible: -allergic reactions like skin rash, itching or hives, swelling of the face, lips, or tongue -anxiety, confusion, or depression -breathing problems -changes in vision -feeling faint or lightheaded, falls -jaw burning, cramping, pain -muscle cramps, stiffness, or weakness -trouble passing urine or change in the amount of urine Side effects that usually do not require medical attention (report to your doctor or health care professional if they continue or are bothersome): -bone, joint, or muscle pain -  fever -hair loss -irritation at site where injected -loss of appetite -nausea, vomiting -stomach upset -tired This list may not describe all possible side effects. Call your doctor for medical advice about side effects. You may report side effects to FDA at  1-800-FDA-1088. Where should I keep my medicine? This drug is given in a hospital or clinic and will not be stored at home. NOTE: This sheet is a summary. It may not cover all possible information. If you have questions about this medicine, talk to your doctor, pharmacist, or health care provider.  2012, Elsevier/Gold Standard. (01/24/2011 9:06:58 AM) 

## 2012-11-01 NOTE — Progress Notes (Signed)
CC:   [name]. Cleta Alberts, M.D.  DIAGNOSES: 1. IgG kappa plasmacytoma. 2. Iron-deficiency anemia.  CURRENT THERAPY: 1. Zometa 4 mg IV every 6 weeks. 2. IV iron as indicated.  INTERIM HISTORY:  Ms. Wingert comes in for followup.  She completed her radiation therapy to the left rib lesion.  She seemed to tolerate this quite well.  She completed her treatments, I think, back in February. February 11 was her last date.  She had a 50.4 Gy of therapy in 28 fractions.  Her last myeloma studies done back in early February showed monoclonal spike of 0.58 g/dL.  IgG level was 1490 mg/dL.  Kappa light chain was 2.39 mg/dL.  She feels okay.  Her mother had to be put in the hospital.  She is 62 years old.  Her mother basically stopped eating.  Ms. Freehling understands the situation very well.  Ms. Reising also had some leg swelling back in early March.  This was in the, I think, left leg.  A Doppler did not show any evidence of a DVT.  She has had no cough.  There have been no rashes.  There has been no headache.  She has had no bony pain.  Her initial bone survey that we did back in September of last year basically showed the lytic lesion in the left 2nd rib.  She had question subtle lucent foci in temporal bone.  PHYSICAL EXAMINATION:  General:  This is a well-developed, well- nourished African American female in no obvious distress.  Vital signs: Temperature of 98.2, pulse 67, respiratory rate 16, blood pressure 127/62.  Weight is 218.  Head and neck:  Normocephalic, atraumatic skull.  There are no ocular or oral lesions.  There are no palpable cervical or supraclavicular lymph nodes.  Lungs:  Clear bilaterally. Cardiac:  Regular rate and rhythm with a normal S1 and S2.  She has a 1/6 systolic ejection murmur.  Abdomen:  Soft with good bowel sounds. There is no palpable abdominal mass.  There is no palpable hepatosplenomegaly.  Extremities:  No clubbing, cyanosis, or edema.  She does have some  compression stockings on.  Skin:  No rashes, ecchymosis, or petechia.  LABORATORY STUDIES:  White cell count is 4.2, hemoglobin 11.6, hematocrit 35.5, platelet count 185.  IMPRESSION:  Ms. Roads is a 62 year old African American female with what likely is a plasmacytoma of the left 2nd rib.  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  I think that we can just follow her for right now. She has not had a bone marrow test done.  She has other health issues that we need to be aware of.  We will see how her monoclonal spike does now that she is finally finished with radiation.  We will plan to get her back to see [place] in another 6 weeks' time.    ______________________________ Josph Macho, M.D. PRE/MEDQ  D:  11/01/2012  T:  11/01/2012  Job:  [id number]

## 2012-11-02 ENCOUNTER — Encounter: Payer: Self-pay | Admitting: Radiation Oncology

## 2012-11-02 ENCOUNTER — Ambulatory Visit
Admission: RE | Admit: 2012-11-02 | Discharge: 2012-11-02 | Disposition: A | Discharge status: Home / Self Care | Payer: BC Managed Care – PPO | Source: Ambulatory Visit | Attending: Radiation Oncology | Admitting: Radiation Oncology

## 2012-11-02 VITALS — BP 132/77 | HR 74 | Temp 98.4°F | Wt 221.2 lb

## 2012-11-02 DIAGNOSIS — C9030 Solitary plasmacytoma not having achieved remission: Secondary | ICD-10-CM

## 2012-11-02 HISTORY — DX: Personal history of irradiation: Z92.3

## 2012-11-02 NOTE — Progress Notes (Signed)
Radiation Oncology         [phone] ________________________________  Name: Morgan Espinoza MRN: [id number]  Date: 11/02/2012  DOB: 1951-02-24  Follow-Up Visit Note  Outpatient  CC: DAUB, [name], MD  Josph Macho, MD  Diagnosis:   Plasmacytoma, Left anterior 2nd rib  Interval Since Last Radiation: completed 50.4Gy/28 fractions on 09/21/12  Narrative:  The patient returns today for routine follow-up.  Feels well. Undergoing observation with Dr. Myna Hidalgo.    Getting Zometa.  Good appetite. A little bit of shooting pain at times in the mid sternal/left anterior rib area.                        ALLERGIES:  is allergic to dristan; robitussin (alcohol free); ropinirole hcl; and tramadol.  Meds: Current Outpatient Prescriptions  Medication Sig Dispense Refill  . acetaminophen (TYLENOL) 325 MG tablet Take 650 mg by mouth every 6 (six) hours as needed. Tylenol arthritis      . albuterol (PROVENTIL HFA;VENTOLIN HFA) 108 (90 BASE) MCG/ACT inhaler Inhale into the lungs. As needed only      . amLODipine (NORVASC) 10 MG tablet Take one tablet daily for hypertension  30 tablet  11  . ciprofloxacin (CILOXAN) 0.3 % ophthalmic ointment Place into both eyes QID.      Marland Kitchen docusate sodium (COLACE) 50 MG capsule Take 50 mg by mouth 2 (two) times daily.      . ferrous sulfate 325 (65 FE) MG tablet take 1 tablet by mouth once daily WITH BREAKFAST  30 tablet  11  . fluticasone (FLONASE) 50 MCG/ACT nasal spray Place 2 sprays into the nose daily.      . hyaluronate sodium (RADIAPLEXRX) GEL Apply 1 application topically 2 (two) times daily. 2nd tube per pateint      . HYDROcodone-acetaminophen (NORCO/VICODIN) 5-325 MG per tablet Take 1 tablet by mouth as needed.  30 tablet  1  . interferon beta-1b (BETASERON) 0.3 MG injection Inject 0.25 mg into the skin every other day.      . losartan (COZAAR) 50 MG tablet Take 1 tablet (50 mg total) by mouth every morning.  30 tablet  11  . omeprazole (PRILOSEC) 20 MG  capsule Take 1 capsule (20 mg total) by mouth daily.  30 capsule  11  . ranitidine (ZANTAC) 150 MG tablet Take 150 mg by mouth 2 (two) times daily.       Current Facility-Administered Medications  Medication Dose Route Frequency Provider Last Rate Last Dose  . cyanocobalamin ((VITAMIN B-12)) injection 1,000 mcg  1,000 mcg Intramuscular Q30 days Anders Simmonds, PA-C   1,000 mcg at 10/30/12 1305    Physical Findings: The patient is in no acute distress. Patient is alert and oriented.  weight is 221 lb 3.2 oz (100.336 kg). Her temperature is 98.4 F (36.9 C). Her blood pressure is 132/77 and her pulse is 74. .  Skin has healed well over chest.  Lab Findings: Lab Results  Component Value Date   WBC 4.2 11/01/2012   HGB 11.6 11/01/2012   HCT 35.5 11/01/2012   MCV 94 11/01/2012   PLT 185 11/01/2012      CMP     Component Value Date/Time   NA 140 11/01/2012 1024   NA 140 09/13/2012 1446   K 4.0 11/01/2012 1024   K 4.1 09/13/2012 1446   CL 105 11/01/2012 1024   CL 108 09/13/2012 1446   CO2 28 11/01/2012 1024  CO2 29 09/13/2012 1446   GLUCOSE 85 11/01/2012 1024   GLUCOSE 93 09/13/2012 1446   BUN 17 11/01/2012 1024   BUN 14 09/13/2012 1446   CREATININE 0.95 11/01/2012 1024   CREATININE 0.9 09/13/2012 1446   CALCIUM 8.7 11/01/2012 1024   CALCIUM 8.7 09/13/2012 1446   PROT 6.9 11/01/2012 1024   PROT 7.2 09/13/2012 1446   ALBUMIN 4.1 11/01/2012 1024   AST 28 11/01/2012 1024   AST 34 09/13/2012 1446   ALT 25 11/01/2012 1024   ALKPHOS 63 11/01/2012 1024   ALKPHOS 75 09/13/2012 1446   BILITOT 0.4 11/01/2012 1024   BILITOT 0.70 09/13/2012 1446   GFRNONAA 89* 06/05/2011 1130   GFRAA >90 06/05/2011 1130     Radiographic Findings: No results found.  Impression/Plan:  Doing well.  She can use up the rest of the radiaplex over her chest for her skin healing. I will see her back PRN.  It was a pleasure taking care of her!  I spent 20 minutes minutes face to face with the patient and more than 50% of that time was  spent in counseling and/or coordination of care. _____________________________________   Lonie Peak, MD

## 2012-11-02 NOTE — Progress Notes (Signed)
Fu today following treatment to the left anterior 2nd Rib region.  She denies any pain in this area presently or any new pain, but she reports a intermittent shooting pain in the mid sternal region without any other associated issues.   She states that she "feel good" and is "eating good".   Seen by Dr. Myna Hidalgo on yesterday.  She received Zometa IV on yesterday.

## 2012-11-03 LAB — IRON AND TIBC
Iron: 53 ug/dL (ref 42–145)
UIBC: 236 ug/dL (ref 125–400)

## 2012-11-03 LAB — COMPREHENSIVE METABOLIC PANEL
Alkaline Phosphatase: 63 U/L (ref 39–117)
BUN: 17 mg/dL (ref 6–23)
Glucose, Bld: 85 mg/dL (ref 70–99)
Total Bilirubin: 0.4 mg/dL (ref 0.3–1.2)

## 2012-11-03 LAB — PROTEIN ELECTROPHORESIS, SERUM, WITH REFLEX
Beta 2: 5.8 % (ref 3.2–6.5)
Beta Globulin: 5.6 % (ref 4.7–7.2)
Gamma Globulin: 19.4 % — ABNORMAL HIGH (ref 11.1–18.8)

## 2012-11-03 LAB — IGG, IGA, IGM: IgG (Immunoglobin G), Serum: 1540 mg/dL (ref 690–1700)

## 2012-11-03 LAB — KAPPA/LAMBDA LIGHT CHAINS
Kappa free light chain: 2 mg/dL — ABNORMAL HIGH (ref 0.33–1.94)
Lambda Free Lght Chn: 2.11 mg/dL (ref 0.57–2.63)

## 2012-11-04 ENCOUNTER — Ambulatory Visit
Admission: RE | Admit: 2012-11-04 | Discharge: 2012-11-04 | Disposition: A | Discharge status: Home / Self Care | Payer: BC Managed Care – PPO | Source: Ambulatory Visit | Attending: Emergency Medicine | Admitting: Emergency Medicine

## 2012-11-04 DIAGNOSIS — Z1231 Encounter for screening mammogram for malignant neoplasm of breast: Secondary | ICD-10-CM

## 2012-11-07 ENCOUNTER — Ambulatory Visit (INDEPENDENT_AMBULATORY_CARE_PROVIDER_SITE_OTHER): Payer: BC Managed Care – PPO | Admitting: Emergency Medicine

## 2012-11-07 VITALS — BP 153/78 | HR 78 | Temp 97.7°F | Resp 18 | Ht 61.75 in | Wt 224.0 lb

## 2012-11-07 DIAGNOSIS — M542 Cervicalgia: Secondary | ICD-10-CM

## 2012-11-07 DIAGNOSIS — M25569 Pain in unspecified knee: Secondary | ICD-10-CM

## 2012-11-07 DIAGNOSIS — M25562 Pain in left knee: Secondary | ICD-10-CM

## 2012-11-07 DIAGNOSIS — J040 Acute laryngitis: Secondary | ICD-10-CM

## 2012-11-07 DIAGNOSIS — G35 Multiple sclerosis: Secondary | ICD-10-CM

## 2012-11-07 LAB — POCT SEDIMENTATION RATE: POCT SED RATE: 55 mm/hr — AB (ref 0–22)

## 2012-11-07 LAB — POCT CBC
Granulocyte percent: 47.6 %G (ref 37–80)
Hemoglobin: 11.7 g/dL — AB (ref 12.2–16.2)
Lymph, poc: 1.8 (ref 0.6–3.4)
MCHC: 31.5 g/dL — AB (ref 31.8–35.4)
MPV: 10.1 fL (ref 0–99.8)
POC Granulocyte: 2 (ref 2–6.9)
POC MID %: 10.5 %M (ref 0–12)
RBC: 3.92 M/uL — AB (ref 4.04–5.48)

## 2012-11-07 MED ORDER — METHOCARBAMOL 500 MG PO TABS: 500.0000 mg | ORAL_TABLET | Freq: Three times a day (TID) | ORAL | Status: DC

## 2012-11-07 NOTE — Progress Notes (Signed)
  Subjective:    Patient ID: Morgan Espinoza, female    DOB: September 12, 1950, 62 y.o.   MRN: [id number]  Fever  This is a new problem. The current episode started yesterday. Her temperature was unmeasured prior to arrival. She has tried nothing for the symptoms.  Headache  Associated symptoms include a fever and neck pain.  Knee Pain    Patient comes into our office today with left neck pain that started up 2 weeks ago and went away then flared back up on Friday.  She also have some left knee pain that's been bothering her for 1 month and flared back up recently.  She has loss of voice and feels feverish    Review of Systems  Constitutional: Positive for fever.  HENT: Positive for neck pain, neck stiffness and voice change.        Objective:   Physical Exam patient alert and cooperative. There are no focal cranial nerve abnormalities. Her voice is hoarse. Neck is limited range of motion. Deep tendon reflexes of the upper chambers are 2+ and symmetrical and there's no focal motor weakness of the upper extremities examination of the lower charities reveal significant degenerative changes of both knees. There is limited range of motion of both knees. There is joint line tenderness medial and lateral of the left knee Results for orders placed in visit on 11/07/12  POCT CBC      Result Value Range   WBC 4.3 (*) 4.6 - 10.2 K/uL   Lymph, poc 1.8  0.6 - 3.4   POC LYMPH PERCENT 41.9  10 - 50 %L   MID (cbc) 0.5  0 - 0.9   POC MID % 10.5  0 - 12 %M   POC Granulocyte 2.0  2 - 6.9   Granulocyte percent 47.6  37 - 80 %G   RBC 3.92 (*) 4.04 - 5.48 M/uL   Hemoglobin 11.7 (*) 12.2 - 16.2 g/dL   HCT, POC 40.9 (*) 81.1 - 47.9 %   MCV 95.0  80 - 97 fL   MCH, POC 29.8  27 - 31.2 pg   MCHC 31.5 (*) 31.8 - 35.4 g/dL   RDW, POC 91.4     Platelet Count, POC 123 (*) 142 - 424 K/uL   MPV 10.1  0 - 99.8 fL         Assessment & Plan:  We'll check a CBC and sedimentation rate. Sedimentation rate would be to  rule out temporal arteritis because of her left-sided headache. It is possible some of her hoarseness and  upper extremity symptoms may be related to her MS. She has significant bilateral degenerative changes of both knees and I suspect her knee and leg discomfort is related to that. Her sedimentation rate is elevated at 55 however this is better than previous. She certainly could have polymyalgia. We have assumed that her muscle discomfort was related to her injections of MS to medicines but she could have polymyalgia. I do not think she has temporal arteritis but we'll need to keep this in mind on her followup visit

## 2012-11-29 ENCOUNTER — Ambulatory Visit (INDEPENDENT_AMBULATORY_CARE_PROVIDER_SITE_OTHER): Payer: BC Managed Care – PPO | Admitting: Family Medicine

## 2012-11-29 VITALS — BP 137/77 | HR 73 | Temp 98.1°F | Resp 16 | Ht 63.38 in | Wt 223.2 lb

## 2012-11-29 DIAGNOSIS — D510 Vitamin B12 deficiency anemia due to intrinsic factor deficiency: Secondary | ICD-10-CM

## 2012-11-29 DIAGNOSIS — J302 Other seasonal allergic rhinitis: Secondary | ICD-10-CM

## 2012-11-29 DIAGNOSIS — J309 Allergic rhinitis, unspecified: Secondary | ICD-10-CM

## 2012-11-29 MED ORDER — FLUTICASONE PROPIONATE 50 MCG/ACT NA SUSP: 2.0000 | Freq: Every day | NASAL | Status: DC

## 2012-11-29 MED ORDER — CYANOCOBALAMIN 1000 MCG/ML IJ SOLN
1000.0000 ug | INTRAMUSCULAR | Status: DC
  Administered 2012-11-29: 1000 ug via INTRAMUSCULAR

## 2012-11-29 NOTE — Progress Notes (Addendum)
62 yo retired woman (MS patient) with 24 hours of hoarseness, congestion she attributes to the time of the year.  She also would like her B12 shot  Objective:  NAD, intermittent raspy voice (when distracted, voice returns to normal) HEENT:  Unremarkable Chest:  Clear Ext:  No edema  Assessment:  Allergic rhinitis (seasonal allergies), B12 deficiency  Plan:  Refill flonase, B12 shot

## 2012-12-13 ENCOUNTER — Ambulatory Visit (HOSPITAL_BASED_OUTPATIENT_CLINIC_OR_DEPARTMENT_OTHER): Payer: BC Managed Care – PPO | Admitting: Lab

## 2012-12-13 ENCOUNTER — Ambulatory Visit (HOSPITAL_BASED_OUTPATIENT_CLINIC_OR_DEPARTMENT_OTHER): Payer: BC Managed Care – PPO | Admitting: Hematology & Oncology

## 2012-12-13 ENCOUNTER — Ambulatory Visit (HOSPITAL_BASED_OUTPATIENT_CLINIC_OR_DEPARTMENT_OTHER): Payer: BC Managed Care – PPO

## 2012-12-13 VITALS — BP 141/67 | HR 68 | Temp 98.3°F | Resp 16 | Ht 63.0 in | Wt 224.0 lb

## 2012-12-13 DIAGNOSIS — C9030 Solitary plasmacytoma not having achieved remission: Secondary | ICD-10-CM

## 2012-12-13 DIAGNOSIS — D509 Iron deficiency anemia, unspecified: Secondary | ICD-10-CM

## 2012-12-13 DIAGNOSIS — C9000 Multiple myeloma not having achieved remission: Secondary | ICD-10-CM

## 2012-12-13 LAB — CMP (CANCER CENTER ONLY)
AST: 36 U/L (ref 11–38)
Albumin: 3.4 g/dL (ref 3.3–5.5)
Alkaline Phosphatase: 64 U/L (ref 26–84)
BUN, Bld: 16 mg/dL (ref 7–22)
Potassium: 3.7 mEq/L (ref 3.3–4.7)
Sodium: 144 mEq/L (ref 128–145)
Total Bilirubin: 0.6 mg/dl (ref 0.20–1.60)

## 2012-12-13 LAB — CBC WITH DIFFERENTIAL (CANCER CENTER ONLY)
BASO#: 0 10*3/uL (ref 0.0–0.2)
BASO%: 0.4 % (ref 0.0–2.0)
EOS%: 7.5 % — ABNORMAL HIGH (ref 0.0–7.0)
HGB: 11.7 g/dL (ref 11.6–15.9)
LYMPH#: 1.1 10*3/uL (ref 0.9–3.3)
MCHC: 32.4 g/dL (ref 32.0–36.0)
NEUT#: 2.5 10*3/uL (ref 1.5–6.5)
RDW: 12.4 % (ref 11.1–15.7)
WBC: 4.5 10*3/uL (ref 3.9–10.0)

## 2012-12-13 MED ORDER — ZOLEDRONIC ACID 4 MG/100ML IV SOLN
4.0000 mg | Freq: Once | INTRAVENOUS | Status: AC
  Administered 2012-12-13: 4 mg via INTRAVENOUS
  Filled 2012-12-13: qty 100

## 2012-12-13 NOTE — Progress Notes (Signed)
This office note has been dictated.

## 2012-12-13 NOTE — Progress Notes (Signed)
Chart opened to review orders

## 2012-12-13 NOTE — Patient Instructions (Signed)
Zoledronic Acid injection (Hypercalcemia, Oncology) What is this medicine? ZOLEDRONIC ACID (ZOE le dron ik AS id) lowers the amount of calcium loss from bone. It is used to treat too much calcium in your blood from cancer. It is also used to prevent complications of cancer that has spread to the bone. This medicine may be used for other purposes; ask your health care provider or pharmacist if you have questions. What should I tell my health care provider before I take this medicine? They need to know if you have any of these conditions: -aspirin-sensitive asthma -dental disease -kidney disease -an unusual or allergic reaction to zoledronic acid, other medicines, foods, dyes, or preservatives -pregnant or trying to get pregnant -breast-feeding How should I use this medicine? This medicine is for infusion into a vein. It is given by a health care professional in a hospital or clinic setting. Talk to your pediatrician regarding the use of this medicine in children. Special care may be needed. Overdosage: If you think you have taken too much of this medicine contact a poison control center or emergency room at once. NOTE: This medicine is only for you. Do not share this medicine with others. What if I miss a dose? It is important not to miss your dose. Call your doctor or health care professional if you are unable to keep an appointment. What may interact with this medicine? -certain antibiotics given by injection -NSAIDs, medicines for pain and inflammation, like ibuprofen or naproxen -some diuretics like bumetanide, furosemide -teriparatide -thalidomide This list may not describe all possible interactions. Give your health care provider a list of all the medicines, herbs, non-prescription drugs, or dietary supplements you use. Also tell them if you smoke, drink alcohol, or use illegal drugs. Some items may interact with your medicine. What should I watch for while using this medicine? Visit  your doctor or health care professional for regular checkups. It may be some time before you see the benefit from this medicine. Do not stop taking your medicine unless your doctor tells you to. Your doctor may order blood tests or other tests to see how you are doing. Women should inform their doctor if they wish to become pregnant or think they might be pregnant. There is a potential for serious side effects to an unborn child. Talk to your health care professional or pharmacist for more information. You should make sure that you get enough calcium and vitamin D while you are taking this medicine. Discuss the foods you eat and the vitamins you take with your health care professional. Some people who take this medicine have severe bone, joint, and/or muscle pain. This medicine may also increase your risk for a broken thigh bone. Tell your doctor right away if you have pain in your upper leg or groin. Tell your doctor if you have any pain that does not go away or that gets worse. What side effects may I notice from receiving this medicine? Side effects that you should report to your doctor or health care professional as soon as possible: -allergic reactions like skin rash, itching or hives, swelling of the face, lips, or tongue -anxiety, confusion, or depression -breathing problems -changes in vision -feeling faint or lightheaded, falls -jaw burning, cramping, pain -muscle cramps, stiffness, or weakness -trouble passing urine or change in the amount of urine Side effects that usually do not require medical attention (report to your doctor or health care professional if they continue or are bothersome): -bone, joint, or muscle pain -  fever -hair loss -irritation at site where injected -loss of appetite -nausea, vomiting -stomach upset -tired This list may not describe all possible side effects. Call your doctor for medical advice about side effects. You may report side effects to FDA at  1-800-FDA-1088. Where should I keep my medicine? This drug is given in a hospital or clinic and will not be stored at home. NOTE: This sheet is a summary. It may not cover all possible information. If you have questions about this medicine, talk to your doctor, pharmacist, or health care provider.  2012, Elsevier/Gold Standard. (01/24/2011 9:06:58 AM) 

## 2012-12-14 NOTE — Progress Notes (Signed)
CC:   [name]. Cleta Alberts, M.D.  DIAGNOSES: 1. IgG kappa plasmacytoma. 2. Iron-deficiency anemia.  CURRENT THERAPY: 1. Zometa 4 mg IV q.6 weeks. 2. IV iron as indicated.  INTERIM HISTORY:  Morgan Espinoza comes in for her followup.  She is doing quite well.  She really has no complaints.  There are no problems with pain.  She is getting around okay.  When we last saw her in March, her monoclonal spike was 0.42 g/dL.  IgG level was 1540 mg/dL.  Kappa light chain was 2 mg/dL.  She is working.  She is having no problems with this.  She has had no cough.  There is no bony pain.  She has had no headache. She has had no rashes.  PHYSICAL EXAMINATION:  General:  This is a well-developed, well- nourished African American female in no obvious distress.  Vital signs: Temperature of 98.3, pulse 68, respiratory rate 18, blood pressure 141/67.  Weight is 224.  Head and neck:  Normocephalic, atraumatic skull.  There are no ocular or oral lesions.  There are no palpable cervical or supraclavicular lymph nodes.  Lungs:  Clear bilaterally. Cardiac:  Regular rate and rhythm with a normal S1 and S2.  There are no murmurs, rubs, or bruits.  Abdomen:  Soft with good bowel sounds.  There is no palpable abdominal mass.  There is no palpable hepatosplenomegaly. Extremities:  No clubbing, cyanosis or edema.  Neurological:  No focal neurological deficits.  LABORATORY STUDIES:  White cell count is 4.5, hemoglobin 11.7, hematocrit 36.1, and platelet count 171.  IMPRESSION:  Morgan Espinoza is a very nice 62 year old African American female with an IgG kappa plasmacytoma.  She underwent radiation to this. She completed this back in February.  We are watching her monoclonal studies.  They do appear to improve with each treatment.  We will go ahead and plan to give her Zometa today.  I think this is very reasonable to do for her.  We will go ahead and plan to get her back in about 2 months.  We will not need any  blood work in between visits.    ______________________________ Josph Macho, M.D. PRE/MEDQ  D:  12/13/2012  T:  12/14/2012  Job:  [id number]

## 2012-12-15 LAB — PROTEIN ELECTROPHORESIS, SERUM, WITH REFLEX
Beta Globulin: 5.6 % (ref 4.7–7.2)
Gamma Globulin: 19.1 % — ABNORMAL HIGH (ref 11.1–18.8)
M-Spike, %: 0.47 g/dL
Total Protein, Serum Electrophoresis: 7.2 g/dL (ref 6.0–8.3)

## 2012-12-15 LAB — IGG, IGA, IGM
IgG (Immunoglobin G), Serum: 1390 mg/dL (ref 690–1700)
IgM, Serum: 62 mg/dL (ref 52–322)

## 2012-12-15 LAB — KAPPA/LAMBDA LIGHT CHAINS
Kappa:Lambda Ratio: 1.44 (ref 0.26–1.65)
Lambda Free Lght Chn: 1.55 mg/dL (ref 0.57–2.63)

## 2012-12-16 ENCOUNTER — Telehealth: Payer: Self-pay | Admitting: *Deleted

## 2012-12-16 NOTE — Telephone Encounter (Signed)
Called patient left message on personal answering machine that labwork looks good

## 2012-12-16 NOTE — Telephone Encounter (Signed)
Message copied by Anselm Jungling on Thu Dec 16, 2012 11:41 AM ------      Message from: Arlan Organ R      Created: Wed Dec 15, 2012  1:24 PM       Call - labs look great !! ------

## 2012-12-21 ENCOUNTER — Telehealth: Payer: Self-pay | Admitting: Hematology & Oncology

## 2012-12-21 NOTE — Telephone Encounter (Addendum)
Message copied by Cathi Roan on Tue Dec 21, 2012  3:16 PM ------      Message from: Josph Macho      Created: Sun Dec 19, 2012  9:04 PM       Call - labs look better!!!  Cindee Lame ----5-13-  3 15  Called and spoke to patient regarding above MD message. Patient had no questions.  Lupita Raider LPN

## 2012-12-28 ENCOUNTER — Ambulatory Visit (INDEPENDENT_AMBULATORY_CARE_PROVIDER_SITE_OTHER): Payer: BC Managed Care – PPO | Admitting: Internal Medicine

## 2012-12-28 VITALS — BP 142/70 | HR 76 | Temp 98.0°F | Resp 16 | Ht 63.0 in | Wt 229.0 lb

## 2012-12-28 DIAGNOSIS — J302 Other seasonal allergic rhinitis: Secondary | ICD-10-CM

## 2012-12-28 DIAGNOSIS — J309 Allergic rhinitis, unspecified: Secondary | ICD-10-CM

## 2012-12-28 NOTE — Patient Instructions (Addendum)
Allergies, Generic  Allergies may happen from anything your body is sensitive to. This may be food, medicines, pollens, chemicals, and nearly anything around you in everyday life that produces allergens. An allergen is anything that causes an allergy producing substance. Heredity is often a factor in causing these problems. This means you may have some of the same allergies as your parents.  Food allergies happen in all age groups. Food allergies are some of the most severe and life threatening. Some common food allergies are cow's milk, seafood, eggs, nuts, wheat, and soybeans.  SYMPTOMS    Swelling around the mouth.   An itchy red rash or hives.   Vomiting or diarrhea.   Difficulty breathing.  SEVERE ALLERGIC REACTIONS ARE LIFE-THREATENING.  This reaction is called anaphylaxis. It can cause the mouth and throat to swell and cause difficulty with breathing and swallowing. In severe reactions only a trace amount of food (for example, peanut oil in a salad) may cause death within seconds.  Seasonal allergies occur in all age groups. These are seasonal because they usually occur during the same season every year. They may be a reaction to molds, grass pollens, or tree pollens. Other causes of problems are house dust mite allergens, pet dander, and mold spores. The symptoms often consist of nasal congestion, a runny itchy nose associated with sneezing, and tearing itchy eyes. There is often an associated itching of the mouth and ears. The problems happen when you come in contact with pollens and other allergens. Allergens are the particles in the air that the body reacts to with an allergic reaction. This causes you to release allergic antibodies. Through a chain of events, these eventually cause you to release histamine into the blood stream. Although it is meant to be protective to the body, it is this release that causes your discomfort. This is why you were given anti-histamines to feel better. If you are  unable to pinpoint the offending allergen, it may be determined by skin or blood testing. Allergies cannot be cured but can be controlled with medicine.  Hay fever is a collection of all or some of the seasonal allergy problems. It may often be treated with simple over-the-counter medicine such as diphenhydramine. Take medicine as directed. Do not drink alcohol or drive while taking this medicine. Check with your caregiver or package insert for child dosages.  If these medicines are not effective, there are many new medicines your caregiver can prescribe. Stronger medicine such as nasal spray, eye drops, and corticosteroids may be used if the first things you try do not work well. Other treatments such as immunotherapy or desensitizing injections can be used if all else fails. Follow up with your caregiver if problems continue. These seasonal allergies are usually not life threatening. They are generally more of a nuisance that can often be handled using medicine.  HOME CARE INSTRUCTIONS    If unsure what causes a reaction, keep a diary of foods eaten and symptoms that follow. Avoid foods that cause reactions.   If hives or rash are present:   Take medicine as directed.   You may use an over-the-counter antihistamine (diphenhydramine) for hives and itching as needed.   Apply cold compresses (cloths) to the skin or take baths in cool water. Avoid hot baths or showers. Heat will make a rash and itching worse.   If you are severely allergic:   Following a treatment for a severe reaction, hospitalization is often required for closer follow-up.     Wear a medic-alert bracelet or necklace stating the allergy.   You and your family must learn how to give adrenaline or use an anaphylaxis kit.   If you have had a severe reaction, always carry your anaphylaxis kit or EpiPen with you. Use this medicine as directed by your caregiver if a severe reaction is occurring. Failure to do so could have a fatal outcome.  SEEK  MEDICAL CARE IF:   You suspect a food allergy. Symptoms generally happen within 30 minutes of eating a food.   Your symptoms have not gone away within 2 days or are getting worse.   You develop new symptoms.   You want to retest yourself or your child with a food or drink you think causes an allergic reaction. Never do this if an anaphylactic reaction to that food or drink has happened before. Only do this under the care of a caregiver.  SEEK IMMEDIATE MEDICAL CARE IF:    You have difficulty breathing, are wheezing, or have a tight feeling in your chest or throat.   You have a swollen mouth, or you have hives, swelling, or itching all over your body.   You have had a severe reaction that has responded to your anaphylaxis kit or an EpiPen. These reactions may return when the medicine has worn off. These reactions should be considered life threatening.  MAKE SURE YOU:    Understand these instructions.   Will watch your condition.   Will get help right away if you are not doing well or get worse.  Document Released: 10/21/2002 Document Revised: 10/20/2011 Document Reviewed: 03/27/2008  ExitCare Patient Information 2013 [employer].

## 2012-12-28 NOTE — Progress Notes (Signed)
  Subjective:    Patient ID: ALYSSANDRA HULSEBUS, female    DOB: July 16, 1951, 62 y.o.   MRN: [id number]  HPI 62 YO female patient comes in today with complaints of allergies. She has been sneezing, nasal congestion. She was given a little pill by the pharmacist for allergies (claritin). She has a minor cough. Her throat is a little irritated. She complains of Post Nasal Drip. No fever. No cough. Symptoms present for 3 days.  Active problems stable Patient Active Problem List   Diagnosis Date Noted  . 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 Multiple sclerosis   . Anemia   . Hyperlipidemia   . Vertigo   . Vitamin D deficiency   . Obesity   . Achilles tendonitis   . OA (osteoarthritis) of knee   . Anemia, iron deficiency 07/23/2012  . Myeloma 07/12/2012  . 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 Cancer 05/11/2012  . Rib lesion 04/28/2012  . Chest pain 06/05/2011  . Hypertension 06/05/2011  . Asthma 06/05/2011  . GERD (gastroesophageal reflux disease) 06/05/2011  . MS (multiple sclerosis) 06/05/2011   Scheduled Meds: . cyanocobalamin  1,000 mcg Intramuscular Q30 days  . cyanocobalamin  1,000 mcg Intramuscular Q30 days   Continuous Infusions:  PRN Meds:. Current outpatient prescriptions:acetaminophen (TYLENOL) 325 MG tablet albuterol (PROVENTIL HFA;VENTOLIN HFA) 108 (90 BASE) MCG/ACT inhaler, Inhale into the lungs. As needed only-not needed recently amLODipine (NORVASC) 10 MG tablet  docusate sodium (COLACE) 50 MG capsule, Take 50 mg by mouth 2 (two) times daily ferrous sulfate 325 (65 FE) MG tablet, take 1 tablet by mouth once daily WITH BREAKFASTHYDROcodone-acetaminophen (NORCO/VICODIN) 5-325 MG per tablet, Take 1 tablet by mouth as needed., Disp: 30 tablet, Rfl: 1;  interferon beta-1b (BETASERON) 0.3 MG injection, Inject 0.25 mg into the skin every other day loratadine (CLARITIN) 10 MG tablet, Take 10 mg by mouth daily losartan (COZAAR) 50 MG tabletomeprazole (PRILOSEC) 20 MG capsule, Take 1 capsule (20  mg total) by mouth daily., Disp: 30 capsule, Rfl: 11;  ranitidine (ZANTAC) 150 MG tablet, Take 150 mg by mouth 2 (two) times daily., Disp: , Rfl:  Current facility-administered medications:cyanocobalamin ((VITAMIN B-12)) injection 1,000 mcg, 1,000 mcg, Intramuscular, Q30 days   Review of Systems Noncontributory    Objective:   Physical Exam BP 142/70  Pulse 76  Temp(Src) 98 F (36.7 C)  Resp 16  Ht 5\' 3"  (1.6 m)  Wt 229 lb (103.874 kg)  BMI 40.58 kg/m2 HEENT= TMs clear/nares boggy with clear rhinorrhea/throat clear  Conjunctiva clear/oral pharynx clear  Lungs clear to auscultation       Assessment & Plan:  Allergies versus URI  Continue Claritin  take a Benadryl at night time add Flonase spray.  Followup for fever

## 2013-01-05 ENCOUNTER — Encounter: Payer: Self-pay | Admitting: Emergency Medicine

## 2013-01-06 ENCOUNTER — Other Ambulatory Visit: Payer: Self-pay | Admitting: Emergency Medicine

## 2013-01-10 ENCOUNTER — Telehealth: Payer: Self-pay | Admitting: Hematology & Oncology

## 2013-01-10 NOTE — Telephone Encounter (Signed)
Left message moved time of 7-7 appointment

## 2013-01-28 ENCOUNTER — Encounter: Payer: Self-pay | Admitting: Nurse Practitioner

## 2013-01-28 ENCOUNTER — Ambulatory Visit (INDEPENDENT_AMBULATORY_CARE_PROVIDER_SITE_OTHER): Payer: BC Managed Care – PPO | Admitting: Nurse Practitioner

## 2013-01-28 VITALS — BP 131/78 | HR 81 | Ht 62.0 in | Wt 222.0 lb

## 2013-01-28 DIAGNOSIS — Z79899 Other long term (current) drug therapy: Secondary | ICD-10-CM

## 2013-01-28 DIAGNOSIS — G35 Multiple sclerosis: Secondary | ICD-10-CM

## 2013-01-28 NOTE — Progress Notes (Signed)
HPI: Patient returns for followup after last visit in 01/29/2012. She has a history of multiple sclerosis. She has had no relapses since 2002, currently on Betaseron every other day tolerating the medication without any injection site problems. She pretreats site with ice . 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    She denies any double vision, loss of vision, sensory changes, loss of bowel or bladder control. She has intermittent urinary frequency and has a history of asthma. She has had no falls, no dizziness, no balance issues. No new neurologic complaints. 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 Oropharynx benign Neck: supple with no carotid  bruits Cardiovascular: regular rate and rhythm, no murmurs  Neurologic Exam Mental Status: Awake and fully alert. Oriented to place and time. Follows all commands. Speech and language normal.   Cranial Nerves: . Pupils equal, briskly reactive to light. Extraocular movements full without nystagmus. Visual fields full to confrontation. Hearing intact and symmetric to finger snap.  Face, tongue, palate move normally and symmetrically. Neck flexion and extension normal.  Motor: Normal bulk and tone. Normal strength in all tested extremity muscles.No focal weakness Sensory.: intact to touch and pinprick and vibratory.  Coordination: Rapid alternating movements normal in all extremities. Finger-to-nose and heel-to-shin performed accurately bilaterally. No dysmetria Gait and Station: Arises from chair without difficulty. Stance is wide based.  Gait demonstrates normal stride length and balance . Able to heel, toe and tandem walk without difficulty.  Reflexes: 1+ and symmetric. Toes downgoing.     ASSESSMENT:  Relapsing remitting multiple sclerosis with last relapse 2002. Currently on Betaseron Discussed switching to oral medication the patient denies injection site issues and prefers to stay on Betaseron.     PLAN: Continue Betaseron Obtain labs today F/U yearly and prn Pt to be assigned to Dr. Kingsley Spittle Darrol Angel, GNP-BC APRN

## 2013-01-28 NOTE — Patient Instructions (Addendum)
Continue Betaseron Obtain labs today F/U yearly and prn

## 2013-01-29 LAB — COMPREHENSIVE METABOLIC PANEL
ALT: 24 IU/L (ref 0–32)
AST: 28 IU/L (ref 0–40)
Albumin/Globulin Ratio: 1.7 (ref 1.1–2.5)
Albumin: 4.3 g/dL (ref 3.6–4.8)
Alkaline Phosphatase: 76 IU/L (ref 39–117)
BUN/Creatinine Ratio: 23 (ref 11–26)
BUN: 20 mg/dL (ref 8–27)
CO2: 26 mmol/L (ref 18–29)
Calcium: 9.1 mg/dL (ref 8.6–10.2)
Chloride: 104 mmol/L (ref 96–108)
Creatinine, Ser: 0.87 mg/dL (ref 0.57–1.00)
GFR calc Af Amer: 83 mL/min/{1.73_m2} (ref >59)
GFR calc non Af Amer: 72 mL/min/{1.73_m2} (ref >59)
Globulin, Total: 2.6 g/dL (ref 1.5–4.5)
Glucose: 92 mg/dL (ref 65–99)
Potassium: 4 mmol/L (ref 3.5–5.2)
Sodium: 140 mmol/L (ref 134–144)
Total Bilirubin: 0.2 mg/dL (ref 0.0–1.2)
Total Protein: 6.9 g/dL (ref 6.0–8.5)

## 2013-01-29 LAB — CBC WITH DIFFERENTIAL/PLATELET
Basophils Absolute: 0 10*3/uL (ref 0.0–0.2)
Basos: 0 % (ref 0–3)
Eos: 8 % — ABNORMAL HIGH (ref 0–5)
Eosinophils Absolute: 0.4 10*3/uL (ref 0.0–0.4)
HCT: 35.4 % (ref 34.0–46.6)
Hemoglobin: 11.9 g/dL (ref 11.1–15.9)
Lymphocytes Absolute: 1.5 10*3/uL (ref 0.7–3.1)
Lymphs: 32 % (ref 14–46)
MCH: 30.4 pg (ref 26.6–33.0)
MCHC: 33.6 g/dL (ref 31.5–35.7)
MCV: 90 fL (ref 79–97)
Monocytes Absolute: 0.5 10*3/uL (ref 0.1–0.9)
Monocytes: 10 % (ref 4–12)
Neutrophils Absolute: 2.2 10*3/uL (ref 1.4–7.0)
Neutrophils Relative %: 50 % (ref 40–74)
RBC: 3.92 x10E6/uL (ref 3.77–5.28)
RDW: 12.8 % (ref 12.3–15.4)
WBC: 4.5 10*3/uL (ref 3.4–10.8)

## 2013-01-31 ENCOUNTER — Telehealth: Payer: Self-pay

## 2013-01-31 NOTE — Telephone Encounter (Signed)
Message copied by [facility] on Mon Jan 31, 2013  1:06 PM ------      Message from: Beverely Low      Created: Mon Jan 31, 2013  8:51 AM         Labs look good. Please call patient            ----- Message -----         From: [facility] Lab Results In Interface         Sent: 01/29/2013   5:47 AM           To: Nilda Riggs, NP                   ------

## 2013-01-31 NOTE — Telephone Encounter (Signed)
I reviewed note from Ms. Daphine Deutscher, NP and called patient. I advised patient that her lab results were normal. She had no questions.

## 2013-02-04 ENCOUNTER — Other Ambulatory Visit: Payer: Self-pay

## 2013-02-04 MED ORDER — INTERFERON BETA-1B 0.3 MG ~~LOC~~ KIT: 0.2500 mg | PACK | SUBCUTANEOUS | Status: DC

## 2013-02-04 NOTE — Telephone Encounter (Signed)
Former [facility] patient.  Has not been assigned new provider.  Auth refill via [facility]  

## 2013-02-14 ENCOUNTER — Ambulatory Visit: Payer: BC Managed Care – PPO

## 2013-02-14 ENCOUNTER — Ambulatory Visit: Payer: BC Managed Care – PPO | Admitting: Medical

## 2013-02-14 ENCOUNTER — Ambulatory Visit (HOSPITAL_BASED_OUTPATIENT_CLINIC_OR_DEPARTMENT_OTHER): Payer: BC Managed Care – PPO

## 2013-02-14 ENCOUNTER — Ambulatory Visit (HOSPITAL_BASED_OUTPATIENT_CLINIC_OR_DEPARTMENT_OTHER): Payer: BC Managed Care – PPO | Admitting: Hematology & Oncology

## 2013-02-14 ENCOUNTER — Other Ambulatory Visit: Payer: BC Managed Care – PPO | Admitting: Lab

## 2013-02-14 ENCOUNTER — Telehealth: Payer: Self-pay | Admitting: Hematology & Oncology

## 2013-02-14 ENCOUNTER — Ambulatory Visit (HOSPITAL_BASED_OUTPATIENT_CLINIC_OR_DEPARTMENT_OTHER): Payer: BC Managed Care – PPO | Admitting: Lab

## 2013-02-14 VITALS — BP 121/62 | HR 63 | Temp 98.0°F | Resp 16 | Ht 62.0 in | Wt 223.0 lb

## 2013-02-14 DIAGNOSIS — C9030 Solitary plasmacytoma not having achieved remission: Secondary | ICD-10-CM

## 2013-02-14 DIAGNOSIS — D509 Iron deficiency anemia, unspecified: Secondary | ICD-10-CM

## 2013-02-14 DIAGNOSIS — C9000 Multiple myeloma not having achieved remission: Secondary | ICD-10-CM

## 2013-02-14 LAB — CBC WITH DIFFERENTIAL (CANCER CENTER ONLY)
BASO%: 0.4 % (ref 0.0–2.0)
HCT: 36.8 % (ref 34.8–46.6)
LYMPH%: 33.4 % (ref 14.0–48.0)
MCH: 31 pg (ref 26.0–34.0)
MCHC: 32.3 g/dL (ref 32.0–36.0)
MCV: 96 fL (ref 81–101)
MONO%: 11 % (ref 0.0–13.0)
NEUT%: 48.7 % (ref 39.6–80.0)
Platelets: 135 10*3/uL — ABNORMAL LOW (ref 145–400)
RDW: 12 % (ref 11.1–15.7)

## 2013-02-14 LAB — FERRITIN CHCC: Ferritin: 401 ng/ml — ABNORMAL HIGH (ref 9–269)

## 2013-02-14 LAB — CHCC SATELLITE - SMEAR

## 2013-02-14 MED ORDER — SODIUM CHLORIDE 0.9 % IV SOLN
Freq: Once | INTRAVENOUS | Status: AC
  Administered 2013-02-14: 13:00:00 via INTRAVENOUS

## 2013-02-14 MED ORDER — ZOLEDRONIC ACID 4 MG/100ML IV SOLN
4.0000 mg | Freq: Once | INTRAVENOUS | Status: AC
  Administered 2013-02-14: 4 mg via INTRAVENOUS
  Filled 2013-02-14: qty 100

## 2013-02-14 NOTE — Patient Instructions (Signed)
Zoledronic Acid injection (Hypercalcemia, Oncology) What is this medicine? ZOLEDRONIC ACID (ZOE le dron ik AS id) lowers the amount of calcium loss from bone. It is used to treat too much calcium in your blood from cancer. It is also used to prevent complications of cancer that has spread to the bone. This medicine may be used for other purposes; ask your health care provider or pharmacist if you have questions. What should I tell my health care provider before I take this medicine? They need to know if you have any of these conditions: -aspirin-sensitive asthma -dental disease -kidney disease -an unusual or allergic reaction to zoledronic acid, other medicines, foods, dyes, or preservatives -pregnant or trying to get pregnant -breast-feeding How should I use this medicine? This medicine is for infusion into a vein. It is given by a health care professional in a hospital or clinic setting. Talk to your pediatrician regarding the use of this medicine in children. Special care may be needed. Overdosage: If you think you have taken too much of this medicine contact a poison control center or emergency room at once. NOTE: This medicine is only for you. Do not share this medicine with others. What if I miss a dose? It is important not to miss your dose. Call your doctor or health care professional if you are unable to keep an appointment. What may interact with this medicine? -certain antibiotics given by injection -NSAIDs, medicines for pain and inflammation, like ibuprofen or naproxen -some diuretics like bumetanide, furosemide -teriparatide -thalidomide This list may not describe all possible interactions. Give your health care provider a list of all the medicines, herbs, non-prescription drugs, or dietary supplements you use. Also tell them if you smoke, drink alcohol, or use illegal drugs. Some items may interact with your medicine. What should I watch for while using this medicine? Visit  your doctor or health care professional for regular checkups. It may be some time before you see the benefit from this medicine. Do not stop taking your medicine unless your doctor tells you to. Your doctor may order blood tests or other tests to see how you are doing. Women should inform their doctor if they wish to become pregnant or think they might be pregnant. There is a potential for serious side effects to an unborn child. Talk to your health care professional or pharmacist for more information. You should make sure that you get enough calcium and vitamin D while you are taking this medicine. Discuss the foods you eat and the vitamins you take with your health care professional. Some people who take this medicine have severe bone, joint, and/or muscle pain. This medicine may also increase your risk for a broken thigh bone. Tell your doctor right away if you have pain in your upper leg or groin. Tell your doctor if you have any pain that does not go away or that gets worse. What side effects may I notice from receiving this medicine? Side effects that you should report to your doctor or health care professional as soon as possible: -allergic reactions like skin rash, itching or hives, swelling of the face, lips, or tongue -anxiety, confusion, or depression -breathing problems -changes in vision -feeling faint or lightheaded, falls -jaw burning, cramping, pain -muscle cramps, stiffness, or weakness -trouble passing urine or change in the amount of urine Side effects that usually do not require medical attention (report to your doctor or health care professional if they continue or are bothersome): -bone, joint, or muscle pain -  fever -hair loss -irritation at site where injected -loss of appetite -nausea, vomiting -stomach upset -tired This list may not describe all possible side effects. Call your doctor for medical advice about side effects. You may report side effects to FDA at  1-800-FDA-1088. Where should I keep my medicine? This drug is given in a hospital or clinic and will not be stored at home. NOTE: This sheet is a summary. It may not cover all possible information. If you have questions about this medicine, talk to your doctor, pharmacist, or health care provider.  2013, [employer]. (01/24/2011 9:06:58 AM)  

## 2013-02-14 NOTE — Progress Notes (Signed)
This office note has been dictated.

## 2013-02-14 NOTE — Telephone Encounter (Signed)
Mailed September schedule °

## 2013-02-15 NOTE — Progress Notes (Signed)
CC:   [name]. Morgan Espinoza, M.D.  DIAGNOSIS: 1. IgG kappa plasma cytoma. 2. History of iron deficiency anemia.  CURRENT THERAPY: 1. Zometa 4 mg IV every 6 weeks. 2. IV iron as indicated.  INTERIM HISTORY:  Morgan Espinoza comes in for followup.  She is really feeling well.  She has had no problems since we last saw her in May. She had a good July 4th weekend.  Of note, she last got iron I think back in December of last year.  We have been following her monoclonal studies.  So far, we have not seen any issues with respect to her monoclonal spike worsening.  Back in May, her monoclonal spike was 0.47 g/dL.  This is essentially holding stable.  She has had no problems with nausea or vomiting.  There has been no change in bowel or bladder habits.  She has had no leg swelling.  She had no arm swelling.  PHYSICAL EXAMINATION:  General:  This is a well-developed, well- nourished African American female in no obvious distress.  Vital signs: Temperature of 98, pulse 63, respiratory rate 16, blood pressure 121/62. Weight is 223.  Head and neck:  Normocephalic, atraumatic skull.  There are no ocular or oral lesions.  There are no palpable cervical or supraclavicular lymph nodes.  Lungs:  Clear bilaterally.  Cardiac: Regular rate and rhythm with a normal S1 and S2.  There are no murmurs, rubs or bruits.  Abdomen:  Soft with good bowel sounds.  There is no palpable abdominal mass.  There is no fluid wave.  There is no palpable hepatosplenomegaly.  Extremities:  Show no clubbing, cyanosis or edema. Neurological:  Shows no focal neurological deficits.  LABORATORY STUDIES:  White cell count is 4.5, hemoglobin 11.9, hematocrit 36.8, platelet count 135.  IMPRESSION:  Morgan Espinoza is a very charming 62 year old African American female with an IgG kappa plasma cytoma. Again, workup did not show any evidence of actual myeloma.  We are giving her Zometa.  I think there is a role for Zometa  with respect to her  situation.  We will go ahead and plan to get her back after Labor Day now.  I think we can probably go a couple months and try to get her through the whole summer without having to have her come back.    ______________________________ Josph Macho, M.D. PRE/MEDQ  D:  02/14/2013  T:  02/15/2013  Job:  [id number]

## 2013-02-16 LAB — IGG, IGA, IGM
IgG (Immunoglobin G), Serum: 1480 mg/dL (ref 690–1700)
IgM, Serum: 66 mg/dL (ref 52–322)

## 2013-02-16 LAB — PROTEIN ELECTROPHORESIS, SERUM, WITH REFLEX
Beta 2: 5.5 % (ref 3.2–6.5)
Gamma Globulin: 17.7 % (ref 11.1–18.8)
M-Spike, %: 0.5 g/dL

## 2013-02-16 LAB — LACTATE DEHYDROGENASE: LDH: 237 U/L (ref 94–250)

## 2013-02-16 LAB — IFE INTERPRETATION

## 2013-02-17 ENCOUNTER — Other Ambulatory Visit: Payer: Self-pay | Admitting: Family Medicine

## 2013-02-17 ENCOUNTER — Ambulatory Visit: Payer: BC Managed Care – PPO

## 2013-02-17 ENCOUNTER — Ambulatory Visit (INDEPENDENT_AMBULATORY_CARE_PROVIDER_SITE_OTHER): Payer: BC Managed Care – PPO | Admitting: Family Medicine

## 2013-02-17 VITALS — BP 126/76 | HR 100 | Temp 98.1°F | Resp 16 | Ht 63.0 in | Wt 222.0 lb

## 2013-02-17 DIAGNOSIS — M79662 Pain in left lower leg: Secondary | ICD-10-CM

## 2013-02-17 DIAGNOSIS — M2550 Pain in unspecified joint: Secondary | ICD-10-CM

## 2013-02-17 DIAGNOSIS — E538 Deficiency of other specified B group vitamins: Secondary | ICD-10-CM

## 2013-02-17 DIAGNOSIS — M79609 Pain in unspecified limb: Secondary | ICD-10-CM

## 2013-02-17 DIAGNOSIS — M791 Myalgia, unspecified site: Secondary | ICD-10-CM

## 2013-02-17 DIAGNOSIS — M79661 Pain in right lower leg: Secondary | ICD-10-CM

## 2013-02-17 DIAGNOSIS — IMO0001 Reserved for inherently not codable concepts without codable children: Secondary | ICD-10-CM

## 2013-02-17 LAB — MAGNESIUM: Magnesium: 2.1 mg/dL (ref 1.5–2.5)

## 2013-02-17 LAB — COMPREHENSIVE METABOLIC PANEL
BUN: 19 mg/dL (ref 6–23)
CO2: 26 mEq/L (ref 19–32)
Creat: 0.91 mg/dL (ref 0.50–1.10)
Glucose, Bld: 90 mg/dL (ref 70–99)
Total Bilirubin: 0.4 mg/dL (ref 0.3–1.2)

## 2013-02-17 LAB — C-REACTIVE PROTEIN: CRP: 5.6 mg/dL — ABNORMAL HIGH (ref <0.60)

## 2013-02-17 LAB — POCT SEDIMENTATION RATE: POCT SED RATE: 77 mm/hr — AB (ref 0–22)

## 2013-02-17 LAB — TSH: TSH: 0.771 u[IU]/mL (ref 0.350–4.500)

## 2013-02-17 LAB — VITAMIN B12: Vitamin B-12: 464 pg/mL (ref 211–911)

## 2013-02-17 LAB — CK TOTAL AND CKMB (NOT AT ARMC): Total CK: 730 U/L — ABNORMAL HIGH (ref 7–177)

## 2013-02-17 MED ORDER — CYANOCOBALAMIN 1000 MCG/ML IJ SOLN
1000.0000 ug | Freq: Once | INTRAMUSCULAR | Status: AC
  Administered 2013-02-17: 1000 ug via INTRAMUSCULAR

## 2013-02-17 MED ORDER — HYDROCODONE-ACETAMINOPHEN 5-325 MG PO TABS: 1.0000 | ORAL_TABLET | Freq: Four times a day (QID) | ORAL | Status: DC | PRN

## 2013-02-17 NOTE — Progress Notes (Signed)
Subjective:    Patient ID: Morgan Espinoza, female    DOB: Aug 18, 1950, 62 y.o.   MRN: [id number] Chief Complaint  Patient presents with  . b12 shot  . Shoulder Pain    both  . Knee Pain    both    HPI  Hurting all over - neck, knees, shoulders, back of legs.  Stopped going to exercise in the pool when she was diagnosed with cancer a long time ago.  So not getting any exercise.  Has been having this pain for about the past 2 weeks now.  She takes tylenol for this and occasional vicodin with some relief.  Most pain when she gets up in the morning and lays down at night.  Some consideration by Morgan Espinoza previously that she could have PMR but sxs were relieved with arthritis treatment so further eval put on hold.  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  Past Medical History  Diagnosis Date  . Hypertension   . GERD (gastroesophageal reflux disease)   . Asthma   . Multiple sclerosis   . Hyperlipidemia   . Vertigo   . Vitamin D deficiency   . Obesity   . Achilles tendonitis   . OA (osteoarthritis) of knee   . Anemia, iron deficiency 07/23/2012  . 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 Cancer 05/2012    plasma cell myeloma  . S/P radiation therapy 08/12/12 - 09/21/12    left Anterior 2nd Rib Lesion / 50.4 GY / 28 Fractions   Allergies  Allergen Reactions  . Dristan Other (See Comments)    "made my chest tight"  . Robitussin (Alcohol Free) (Guaifenesin) Other (See Comments)    "made my chest tight"  . Ropinirole Hcl Other (See Comments)    "made my chest tight"  . Tramadol Other (See Comments)    "made my chest tight"     Current Facility-Administered Medications on File Prior to Visit  Medication Dose Route Frequency Provider Last Rate Last Dose  . cyanocobalamin ((VITAMIN B-12)) injection 1,000 mcg  1,000 mcg Intramuscular Q30 days Morgan Simmonds,  PA-C   1,000 mcg at 10/30/12 1305  . cyanocobalamin ((VITAMIN B-12)) injection 1,000 mcg  1,000 mcg Intramuscular Q30 days Morgan Sidle, MD   1,000 mcg at 11/29/12 1117   Current Outpatient Prescriptions on File Prior to Visit  Medication Sig Dispense Refill  . albuterol (PROVENTIL HFA;VENTOLIN HFA) 108 (90 BASE) MCG/ACT inhaler Inhale into the lungs. As needed only      . amLODipine (NORVASC) 10 MG tablet Take one tablet daily for hypertension  30 tablet  11  . ferrous sulfate 325 (65 FE) MG tablet take 1 tablet by mouth once daily WITH BREAKFAST  30 tablet  11  . Interferon Beta-1b (BETASERON) 0.3 MG KIT injection Inject 0.25 mg into the skin every other day.  1 kit  0  . losartan (COZAAR) 50 MG tablet Take 1 tablet (50 mg total) by mouth every morning.  30 tablet  11  . omeprazole (PRILOSEC) 20 MG capsule Take 1 capsule (20 mg total) by mouth daily.  30 capsule  11  . ranitidine (ZANTAC) 150 MG tablet take 1 tablet by mouth twice a day  60 tablet  3    Review of Systems  Constitutional: Negative for unexpected weight change.  HENT: Positive for neck pain and neck stiffness.   Respiratory: Negative for cough, chest tightness and shortness of breath.   Cardiovascular: Positive for leg swelling. Negative for chest pain and palpitations.  Gastrointestinal: Negative for diarrhea and constipation.  Genitourinary: Negative for dysuria, urgency, enuresis and difficulty urinating.  Musculoskeletal: Positive for myalgias, back pain, joint swelling, arthralgias and gait problem.  Neurological: Positive for weakness. Negative for numbness.       BP 126/76  Pulse 100  Temp(Src) 98.1 F (36.7 C) (Oral)  Resp 16  Ht 5\' 3"  (1.6 m)  Wt 222 lb (100.699 kg)  BMI 39.34 kg/m2  SpO2 96% Objective:   Physical Exam  Constitutional: She is oriented to person, place, and time. She appears well-developed and well-nourished. No distress.  HENT:  Head: Normocephalic and atraumatic.  Right Ear:  External ear normal.  Eyes: Conjunctivae are normal. No scleral icterus.  Pulmonary/Chest: Effort normal.  Musculoskeletal:       Right shoulder: She exhibits bony tenderness. She exhibits normal range of motion.       Left shoulder: She exhibits bony tenderness. She exhibits normal range of motion.       Right knee: She exhibits bony tenderness. She exhibits normal range of motion. Tenderness found. Patellar tendon tenderness noted.       Left knee: She exhibits bony tenderness. She exhibits normal range of motion. Tenderness found. Patellar tendon tenderness noted.       Cervical back: She exhibits decreased range of motion and bony tenderness.  Tender to palpation over bilateral calves and posterior thighs as well as lateral acromion ttp over lower c-spine  Neurological: She is alert and oriented to person, place, and time.  Skin: Skin is warm and dry. She is not diaphoretic. No erythema.  Psychiatric: She has a normal mood and affect. Her behavior is normal.     [facility] reading (PRIMARY) by  Morgan Espinoza. C spine:  Mild deg spondylosis in lower c-spine. No acute abnormality. L shoulder: known lytic lesion of anterior second rib visualized. Mild arthropathy of AC joint Rt Shoulder: No sig abnormality Lt knee: mild medial deg change Rt knee: mild medial deg change Assessment & Plan:  Arthralgia - Plan: TSH, CK total and CKMB, Comprehensive metabolic panel, Magnesium, C-reactive protein, POCT SEDIMENTATION RATE, DG Knee 1-2 Views Left, DG Knee 1-2 Views Right, DG Shoulder Right, DG Shoulder Left, ANA, Vitamin D 25 hydroxy, DG Cervical Spine 2 or 3 views  Myalgia - Plan: TSH, CK total and CKMB, Comprehensive metabolic panel, Magnesium, C-reactive protein, POCT SEDIMENTATION RATE, DG Knee 1-2 Views Left, DG Knee 1-2 Views Right, DG Shoulder Right, DG Shoulder Left, ANA, Vitamin D 25 hydroxy - Did discuss pt w/ Morgan Espinoza, her oncologist, who stated that it is possible that this is a med reaction to  her Zometa - however, less likely as her diffuse pain began before her last Zometa infusion on 02/14/13 (receives every 6 wks).  We will try pt on a burst of prednisone then recheck her crp, esr, cpk in f/u next wk and see if it helped her pain - Morgan Espinoza agreeable to plan.  I have asked the pt to RTC in 1 wk on 7/17 to see me when her PCP Morgan Espinoza who knows this pt better will also be in clinic for consultation.  Consider Rheum eval at f/u?  Vitamin B 12 deficiency - Plan: TSH, CK total and CKMB, Comprehensive metabolic panel, Magnesium, C-reactive protein, Vitamin B12, POCT  SEDIMENTATION RATE, DG Knee 1-2 Views Left, DG Knee 1-2 Views Right, DG Shoulder Right, DG Shoulder Left, ANA, Vitamin D 25 hydroxy, cyanocobalamin ((VITAMIN B-12)) injection 1,000 mcg - check level and IM b12 inj given today.  Bilateral calf pain - Plan: HYDROcodone-acetaminophen (NORCO/VICODIN) 5-325 MG per tablet - Pt uses prn - uses rarely but encouraged pt to use more frequently as long as she is not having side effects while we are working her up and trying to determine cause of and better treatment for her diffuse muscular pain.  Meds ordered this encounter  Medications  . acetaminophen (TYLENOL ARTHRITIS PAIN) 650 MG CR tablet    Sig: Take 650 mg by mouth every 8 (eight) hours as needed for pain.   Marland Kitchen DISCONTD: Tetrahydrozoline-Zn Sulfate (ALLERGY RELIEF EYE DROPS OP)    Sig: Apply 1 drop to eye as needed (each eye).  Marland Kitchen HYDROcodone-acetaminophen (NORCO/VICODIN) 5-325 MG per tablet    Sig: Take 1 tablet by mouth every 6 (six) hours as needed for pain.    Dispense:  30 tablet    Refill:  1  . cyanocobalamin ((VITAMIN B-12)) injection 1,000 mcg    Sig:

## 2013-02-18 ENCOUNTER — Other Ambulatory Visit: Payer: Self-pay | Admitting: Family Medicine

## 2013-02-18 ENCOUNTER — Telehealth: Payer: Self-pay

## 2013-02-18 DIAGNOSIS — E559 Vitamin D deficiency, unspecified: Secondary | ICD-10-CM

## 2013-02-18 LAB — LACTATE DEHYDROGENASE: LDH: 244 U/L (ref 94–250)

## 2013-02-18 LAB — VITAMIN D 25 HYDROXY (VIT D DEFICIENCY, FRACTURES): Vit D, 25-Hydroxy: 20 ng/mL — ABNORMAL LOW (ref 30–89)

## 2013-02-18 MED ORDER — PREDNISONE 20 MG PO TABS: 20.0000 mg | ORAL_TABLET | Freq: Every day | ORAL | Status: DC

## 2013-02-18 MED ORDER — ERGOCALCIFEROL 1.25 MG (50000 UT) PO CAPS: 50000.0000 [IU] | ORAL_CAPSULE | ORAL | Status: DC

## 2013-02-18 NOTE — Telephone Encounter (Signed)
Pt was in the office yesterday and does not recall being told that Vitamin D was going to be called in for her along with the prednisone. Pt would like to know why this was called in? Best# [phone]

## 2013-02-19 NOTE — Telephone Encounter (Signed)
Because her vitamin D level was low. Take it once per week and then after the course if complete, have her levels rechecked.

## 2013-02-21 NOTE — Telephone Encounter (Signed)
Patient advised.

## 2013-02-23 ENCOUNTER — Emergency Department (HOSPITAL_COMMUNITY)
Admission: EM | Admit: 2013-02-23 | Discharge: 2013-02-23 | Disposition: A | Discharge status: Home / Self Care | Payer: BC Managed Care – PPO | Attending: Emergency Medicine | Admitting: Emergency Medicine

## 2013-02-23 ENCOUNTER — Ambulatory Visit (INDEPENDENT_AMBULATORY_CARE_PROVIDER_SITE_OTHER): Payer: BC Managed Care – PPO | Admitting: Emergency Medicine

## 2013-02-23 ENCOUNTER — Emergency Department (HOSPITAL_COMMUNITY): Payer: BC Managed Care – PPO

## 2013-02-23 ENCOUNTER — Encounter (HOSPITAL_COMMUNITY): Payer: Self-pay | Admitting: Emergency Medicine

## 2013-02-23 VITALS — BP 142/78 | HR 91 | Temp 97.9°F | Resp 18 | Ht 61.5 in | Wt 224.0 lb

## 2013-02-23 DIAGNOSIS — Z87898 Personal history of other specified conditions: Secondary | ICD-10-CM | POA: Insufficient documentation

## 2013-02-23 DIAGNOSIS — E785 Hyperlipidemia, unspecified: Secondary | ICD-10-CM | POA: Insufficient documentation

## 2013-02-23 DIAGNOSIS — K30 Functional dyspepsia: Secondary | ICD-10-CM

## 2013-02-23 DIAGNOSIS — Z87891 Personal history of nicotine dependence: Secondary | ICD-10-CM | POA: Insufficient documentation

## 2013-02-23 DIAGNOSIS — Z8639 Personal history of other endocrine, nutritional and metabolic disease: Secondary | ICD-10-CM | POA: Insufficient documentation

## 2013-02-23 DIAGNOSIS — J45909 Unspecified asthma, uncomplicated: Secondary | ICD-10-CM | POA: Insufficient documentation

## 2013-02-23 DIAGNOSIS — Z79899 Other long term (current) drug therapy: Secondary | ICD-10-CM | POA: Insufficient documentation

## 2013-02-23 DIAGNOSIS — I10 Essential (primary) hypertension: Secondary | ICD-10-CM | POA: Insufficient documentation

## 2013-02-23 DIAGNOSIS — R9431 Abnormal electrocardiogram [ECG] [EKG]: Secondary | ICD-10-CM

## 2013-02-23 DIAGNOSIS — K3189 Other diseases of stomach and duodenum: Secondary | ICD-10-CM

## 2013-02-23 DIAGNOSIS — R000 Tachycardia, unspecified: Secondary | ICD-10-CM

## 2013-02-23 DIAGNOSIS — K219 Gastro-esophageal reflux disease without esophagitis: Secondary | ICD-10-CM | POA: Insufficient documentation

## 2013-02-23 DIAGNOSIS — D509 Iron deficiency anemia, unspecified: Secondary | ICD-10-CM | POA: Insufficient documentation

## 2013-02-23 DIAGNOSIS — R1013 Epigastric pain: Secondary | ICD-10-CM

## 2013-02-23 DIAGNOSIS — Z862 Personal history of diseases of the blood and blood-forming organs and certain disorders involving the immune mechanism: Secondary | ICD-10-CM | POA: Insufficient documentation

## 2013-02-23 DIAGNOSIS — R071 Chest pain on breathing: Secondary | ICD-10-CM | POA: Insufficient documentation

## 2013-02-23 DIAGNOSIS — Z8739 Personal history of other diseases of the musculoskeletal system and connective tissue: Secondary | ICD-10-CM | POA: Insufficient documentation

## 2013-02-23 DIAGNOSIS — G35 Multiple sclerosis: Secondary | ICD-10-CM | POA: Insufficient documentation

## 2013-02-23 DIAGNOSIS — R079 Chest pain, unspecified: Secondary | ICD-10-CM

## 2013-02-23 DIAGNOSIS — M79662 Pain in left lower leg: Secondary | ICD-10-CM

## 2013-02-23 DIAGNOSIS — E669 Obesity, unspecified: Secondary | ICD-10-CM | POA: Insufficient documentation

## 2013-02-23 LAB — HEPATIC FUNCTION PANEL
ALT: 19 U/L (ref 0–35)
AST: 21 U/L (ref 0–37)
Albumin: 3.4 g/dL — ABNORMAL LOW (ref 3.5–5.2)
Alkaline Phosphatase: 74 U/L (ref 39–117)
Bilirubin, Direct: <0.1 mg/dL (ref 0.0–0.3)
Total Bilirubin: 0.2 mg/dL — ABNORMAL LOW (ref 0.3–1.2)

## 2013-02-23 LAB — BASIC METABOLIC PANEL
BUN: 15 mg/dL (ref 6–23)
CO2: 26 mEq/L (ref 19–32)
Chloride: 104 mEq/L (ref 96–112)
GFR calc non Af Amer: >90 mL/min (ref >90)
Glucose, Bld: 111 mg/dL — ABNORMAL HIGH (ref 70–99)
Potassium: 4 mEq/L (ref 3.5–5.1)
Sodium: 140 mEq/L (ref 135–145)

## 2013-02-23 LAB — CBC
HCT: 33.8 % — ABNORMAL LOW (ref 36.0–46.0)
Hemoglobin: 11.7 g/dL — ABNORMAL LOW (ref 12.0–15.0)
MCHC: 34.6 g/dL (ref 30.0–36.0)
RBC: 3.72 MIL/uL — ABNORMAL LOW (ref 3.87–5.11)

## 2013-02-23 MED ORDER — GI COCKTAIL ~~LOC~~
30.0000 mL | Freq: Once | ORAL | Status: AC
  Administered 2013-02-23: 30 mL via ORAL
  Filled 2013-02-23: qty 30

## 2013-02-23 NOTE — ED Notes (Signed)
Pt sts she received 324 ASA by urgent care, PTA of ED.

## 2013-02-23 NOTE — ED Provider Notes (Signed)
History    CSN: [id number] Arrival date & time 02/23/13  1158  First MD Initiated Contact with Patient 02/23/13 1211     Chief Complaint  Patient presents with  . Palpitations   (Consider location/radiation/quality/duration/timing/severity/associated sxs/prior Treatment) HPI  Patient presents after an episode of chest pain, palpitations. She woke approximately 10 hours prior to my exam with palpitations, indigestion sensation. The patient was able to sleep after sitting upright, but upon awakening felt the indigestion was still present.  On my exam she denies active chest palpitations or indigestion, complaint only of tenderness to palpation about the sternum. She has no new dyspnea, near syncope, lightheadedness, nausea, vomiting, diarrhea. She has a notable history of plasmacytoma, now status post radiation therapy.  There was involvement of the left second rib.  Past Medical History  Diagnosis Date  . Hypertension   . GERD (gastroesophageal reflux disease)   . Asthma   . Multiple sclerosis   . Hyperlipidemia   . Vertigo   . Vitamin D deficiency   . Obesity   . Achilles tendonitis   . OA (osteoarthritis) of knee   . Anemia, iron deficiency 07/23/2012  . 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 Cancer 05/2012    plasma cell myeloma  . S/P radiation therapy 08/12/12 - 09/21/12    left Anterior 2nd Rib Lesion / 50.4 GY / 28 Fractions   Past Surgical History  Procedure Laterality Date  . Cholecystectomy      lap choley  . Abdominal hysterectomy      fibroids  . Breast surgery      left breast biopsy-benign  . Rotator cuff repair    . Bone biopsy  05/17/12    Left 2nd Rib, Plasma Cell Myeloma   Family History  Problem Relation Age of Onset  . Dementia Mother   . Hypertension Mother   . Cancer Father     lung; +tobacco  . Diabetes Sister   . Cancer Brother   . Diabetes Sister    History  Substance Use Topics  . Smoking status: Former Smoker -- 0.10  packs/day    Types: Cigarettes    Quit date: 04/19/1979  . Smokeless tobacco: Never Used     Comment: in teens, only passive  . Alcohol Use: No   OB History   Grav Para Term Preterm Abortions TAB SAB Ect Mult Living                 Review of Systems  Constitutional:       Per HPI, otherwise negative  HENT:       Per HPI, otherwise negative  Respiratory:       Per HPI, otherwise negative  Cardiovascular:       Per HPI, otherwise negative  Gastrointestinal: Negative for vomiting.  Endocrine:       Negative aside from HPI  Genitourinary:       Neg aside from HPI   Musculoskeletal:       Per HPI, otherwise negative  Skin: Negative.   Neurological: Negative for syncope.    Allergies  Dristan; Robitussin (alcohol free); Ropinirole hcl; and Tramadol  Home Medications   Current Outpatient Rx  Name  Route  Sig  Dispense  Refill  . acetaminophen (TYLENOL ARTHRITIS PAIN) 650 MG CR tablet   Oral   Take 650 mg by mouth every 8 (eight) hours as needed for pain.          Marland Kitchen  albuterol (PROVENTIL HFA;VENTOLIN HFA) 108 (90 BASE) MCG/ACT inhaler   Inhalation   Inhale into the lungs. As needed only         . amLODipine (NORVASC) 10 MG tablet      Take one tablet daily for hypertension   30 tablet   11   . ergocalciferol (VITAMIN D2) 50000 UNITS capsule   Oral   Take 1 capsule (50,000 Units total) by mouth once a week.   4 capsule   2   . ferrous sulfate 325 (65 FE) MG tablet      take 1 tablet by mouth once daily WITH BREAKFAST   30 tablet   11   . HYDROcodone-acetaminophen (NORCO/VICODIN) 5-325 MG per tablet   Oral   Take 1 tablet by mouth every 6 (six) hours as needed for pain.   30 tablet   1   . Interferon Beta-1b (BETASERON) 0.3 MG KIT injection   Subcutaneous   Inject 0.25 mg into the skin every other day.   1 kit   0     PLEASE SCHEDULE APPT   . losartan (COZAAR) 50 MG tablet   Oral   Take 1 tablet (50 mg total) by mouth every morning.   30  tablet   11   . omeprazole (PRILOSEC) 20 MG capsule   Oral   Take 1 capsule (20 mg total) by mouth daily.   30 capsule   11   . predniSONE (DELTASONE) 20 MG tablet   Oral   Take 1 tablet (20 mg total) by mouth daily.   10 tablet   0   . ranitidine (ZANTAC) 150 MG tablet      take 1 tablet by mouth twice a day   60 tablet   3    BP 139/78  Pulse 75  Temp(Src) 98.5 F (36.9 C) (Oral)  Resp 16  SpO2 99% Physical Exam  Nursing note and vitals reviewed. Constitutional: She is oriented to person, place, and time. She appears well-developed and well-nourished. No distress.  HENT:  Head: Normocephalic and atraumatic.  Eyes: Conjunctivae and EOM are normal.  Cardiovascular: Normal rate and regular rhythm.   Pulmonary/Chest: Effort normal and breath sounds normal. No stridor. No respiratory distress.  Tender to palpation about the anterior chest wall, without gross deformity.  Abdominal: She exhibits no distension.  Musculoskeletal: She exhibits no edema.  Neurological: She is alert and oriented to person, place, and time. No cranial nerve deficit.  Skin: Skin is warm and dry.  Psychiatric: She has a normal mood and affect.    ED Course  Procedures (including critical care time) Labs Reviewed  CBC - Abnormal; Notable for the following:    RBC 3.72 (*)    Hemoglobin 11.7 (*)    HCT 33.8 (*)    All other components within normal limits  BASIC METABOLIC PANEL - Abnormal; Notable for the following:    Glucose, Bld 111 (*)    All other components within normal limits  HEPATIC FUNCTION PANEL - Abnormal; Notable for the following:    Albumin 3.4 (*)    Total Bilirubin 0.2 (*)    All other components within normal limits  LIPASE, BLOOD  POCT I-STAT TROPONIN I   No results found. No diagnosis found.  Heart rate 70 sinus rhythm normal Pulse ox 98% room air normal EKG has sinus rhythm 70, with nonspecific T wave changes, no appreciable changes from prior.  This is  borderline.   2:01 PM  Patient calm appearing - no new complaints  5:26 PM Trop #2 - negative.  Patient awake and alert, smiling.  I discussed all results with the patient and her family and can.  She will follow up with her physician tomorrow via telephone. MDM  Patient presents after an episode of chest pain, that resolved significantly prior to my evaluation, and did not recur during the patient's emergency department stay.  Given the patient's tenderness to palpation, the absence of pain or distress, there is low suspicion for ongoing coronary ischemia.  The patient was discharged in stable condition with analgesics, to follow up with her primary care physician.   Gerhard Munch, MD 02/23/13 [phone]

## 2013-02-23 NOTE — Progress Notes (Signed)
  Subjective:    Patient ID: Morgan Espinoza, female    DOB: [birth year], 62 y.o.   MRN: [id number]  HPI Morgan Espinoza is a 62 y.o. female with a medical history of MM, MS, Hypertension, Hyperlipidemia, GERD presenting for episode of tachycardia and indigestion (epigastric area)  at 2:30 a.m. last night.  This awoke her from sleep.  Felt as though it was "regular" in pattern/beat and so loud it was "coming out of her ears."  She drank a warm cherry soda and the feelings went away within 15-20 minutes.    She was seen by Dr. Clelia Croft on 7/10 and put on 1) prednisone for shoulder pain and 2) vitamin D.    Denies CP, SOB, N/V, Cough, Jaw/Shoudler/Arm/Hand pain.    Review of Systems As stated in HPI - otherwise negative.    Objective:   Physical Exam Filed Vitals:   02/23/13 0926  BP: 142/78  Pulse: 91  Temp: 97.9 F (36.6 C)  TempSrc: Oral  Resp: 18  Height: 5' 1.5" (1.562 m)  Weight: 224 lb (101.606 kg)  SpO2: 97%   General:  Obese  female in no acute distress. Skin:  Soft, warm skin with good turgor.  No bruising or lesions present.  Cardiovascular: S1 and S2 distinct with no murmurs, rubs or gallops.  Some tenderness to chest wall. Respiratory:  CTA with no adventitious sounds.   Abdominal:  Soft, non-tender abdomen with no visible pulsations or masses.  Bowel sounds adequate.   PVS:  No carotid bruits.  2+ pedal pulses.  1+ pitting edema around medial malleolus.   Neuro:  Cranial nerves grossly intact.  Gait normal. Psych: A&O x 3.  Responds to questions appropriately with a normal affect.  EKG:  Some T wave inversion, V1-V4.  Similar changes on prior EKG.     Assessment & Plan:  1. Tachycardia 2. Indigestion 3. Nonspecific abnormal electrocardiogram (ECG) (EKG)  Ordered:   - EKG 12-Lead  Placed on monitor.  Put on 2 L O2 via nasal cannula.  Given 4 baby ASA (81 mg) to chew.

## 2013-02-23 NOTE — ED Notes (Signed)
Per EMS - pt woke up this morning about 230am with heart palpitations and indigestion. Pt went back to sleep and then woke up with them again. She decided to go to urgent care [place], was seen there when they called EMS to have her transported for further evaluation. Upon EMS arrival pt sts she is no longer having chest palpitations or indigestion feeling but has some tenderness upon palpation of the sternal area. EMS started a 20G in left hand. BP 147/82, HR 73 NSR, RR 16.

## 2013-03-05 ENCOUNTER — Ambulatory Visit (INDEPENDENT_AMBULATORY_CARE_PROVIDER_SITE_OTHER): Payer: BC Managed Care – PPO | Admitting: Emergency Medicine

## 2013-03-05 VITALS — BP 140/70 | HR 82 | Temp 98.0°F | Resp 16 | Ht 61.5 in | Wt 225.6 lb

## 2013-03-05 DIAGNOSIS — R9431 Abnormal electrocardiogram [ECG] [EKG]: Secondary | ICD-10-CM

## 2013-03-05 DIAGNOSIS — R002 Palpitations: Secondary | ICD-10-CM

## 2013-03-05 DIAGNOSIS — K219 Gastro-esophageal reflux disease without esophagitis: Secondary | ICD-10-CM

## 2013-03-05 MED ORDER — OMEPRAZOLE 20 MG PO CPDR: 20.0000 mg | DELAYED_RELEASE_CAPSULE | Freq: Every day | ORAL | Status: DC

## 2013-03-05 MED ORDER — SUCRALFATE 1 GM/10ML PO SUSP: 1.0000 g | Freq: Four times a day (QID) | ORAL | Status: DC

## 2013-03-05 NOTE — Progress Notes (Signed)
  Subjective:    Patient ID: Morgan Espinoza, female    DOB: 1951-07-24, 62 y.o.   MRN: [id number]  HPI 62 y.o. Female comes into clinic today with palpitations that she noticed last night upon getting in bed. Had [place] food a few hours prior to going to bed.  Had some soreness in abdomen; notices soreness when touching abdomen.Soreness mostly under breast. Laying on left side was painful, on right side was not. Palpitations calmed down since. No nausea, vomiting, diarrhea. Nothing taken for symptoms.  Patient also having pain in right knee   Review of Systems     Objective:   Physical Exam chest is clear to auscultation and percussion. Heart regular rate no murmurs. Abdomen is tender mid epigastrium without rebound.  EKG shows ST elevation in V2. ST-T changes V1 to V3 very similar from a EKG done last week as well as EKG done one year ago      Assessment & Plan:  Be aggressive about her abdominal tenderness she takes a Zantac in the morning will take Prilosec 20 a day along with Carafate. Appointment to be made with cardiologist to evaluate her abnormal EKG and complaints of palpitations.

## 2013-03-05 NOTE — Patient Instructions (Addendum)

## 2013-03-11 ENCOUNTER — Telehealth: Payer: Self-pay | Admitting: Nurse Practitioner

## 2013-03-11 MED ORDER — INTERFERON BETA-1B 0.3 MG ~~LOC~~ KIT: 0.2500 mg | PACK | SUBCUTANEOUS | Status: DC

## 2013-03-11 NOTE — Telephone Encounter (Signed)
Rx has been sent.  [facility] patient assigned to Dr Terrace Arabia per last OV.

## 2013-04-12 ENCOUNTER — Other Ambulatory Visit: Payer: Self-pay

## 2013-04-12 ENCOUNTER — Ambulatory Visit (INDEPENDENT_AMBULATORY_CARE_PROVIDER_SITE_OTHER): Payer: BC Managed Care – PPO | Admitting: Emergency Medicine

## 2013-04-12 VITALS — BP 118/70 | HR 65 | Temp 98.4°F | Resp 18 | Ht 61.0 in | Wt 218.0 lb

## 2013-04-12 DIAGNOSIS — M1710 Unilateral primary osteoarthritis, unspecified knee: Secondary | ICD-10-CM

## 2013-04-12 DIAGNOSIS — M179 Osteoarthritis of knee, unspecified: Secondary | ICD-10-CM

## 2013-04-12 DIAGNOSIS — E538 Deficiency of other specified B group vitamins: Secondary | ICD-10-CM

## 2013-04-12 MED ORDER — CELECOXIB 200 MG PO CAPS: 200.0000 mg | ORAL_CAPSULE | Freq: Two times a day (BID) | ORAL | Status: DC

## 2013-04-12 MED ORDER — CYANOCOBALAMIN 1000 MCG/ML IJ SOLN
1000.0000 ug | Freq: Once | INTRAMUSCULAR | Status: AC
  Administered 2013-04-12: 1000 ug via INTRAMUSCULAR

## 2013-04-12 NOTE — Progress Notes (Signed)
[facility] [address], [place] [address] [phone]  Date:  04/12/2013   Name:  Morgan Espinoza   DOB:  Aug 09, 1951   MRN:  [id number]  PCP:  Lucilla Edin, MD    Chief Complaint: B12 Injection and Knee Pain   History of Present Illness:  Morgan Espinoza is a 62 y.o. very pleasant female patient who presents with the following:  Extensive medical history reviewed.  Says she has chronic pain in knees worse on left with no history of injury.  Has been xrayed several times previously and was treated with a number of different NSAIDS.  Had good result with celebrex.  Asked about flu shots and advised she wait just a month or so.  No improvement with over the counter medications or other home remedies. Denies other complaint or health concern today.   Patient Active Problem List   Diagnosis Date Noted  . Encounter for long-term (current) use of other medications 01/28/2013  . 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 Multiple sclerosis   . Anemia   . Hyperlipidemia   . Vertigo   . Vitamin D deficiency   . Obesity   . Achilles tendonitis   . OA (osteoarthritis) of knee   . Anemia, iron deficiency 07/23/2012  . Myeloma 07/12/2012  . 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 Cancer 05/11/2012  . Rib lesion 04/28/2012  . Chest pain 06/05/2011  . Hypertension 06/05/2011  . Asthma 06/05/2011  . GERD (gastroesophageal reflux disease) 06/05/2011  . MS (multiple sclerosis) 06/05/2011    Past Medical History  Diagnosis Date  . Hypertension   . GERD (gastroesophageal reflux disease)   . Asthma   . Multiple sclerosis   . Hyperlipidemia   . Vertigo   . Vitamin D deficiency   . Obesity   . Achilles tendonitis   . OA (osteoarthritis) of knee   . Anemia, iron deficiency 07/23/2012  . 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 Cancer 05/2012    plasma cell myeloma  . S/P radiation therapy 08/12/12 - 09/21/12    left Anterior 2nd Rib Lesion / 50.4 GY / 28 Fractions    Past  Surgical History  Procedure Laterality Date  . Cholecystectomy      lap choley  . Abdominal hysterectomy      fibroids  . Breast surgery      left breast biopsy-benign  . Rotator cuff repair    . Bone biopsy  05/17/12    Left 2nd Rib, Plasma Cell Myeloma    History  Substance Use Topics  . Smoking status: Former Smoker -- 0.10 packs/day    Types: Cigarettes    Quit date: 04/19/1979  . Smokeless tobacco: Never Used     Comment: in teens, only passive  . Alcohol Use: No    Family History  Problem Relation Age of Onset  . Dementia Mother   . Hypertension Mother   . Cancer Father     lung; +tobacco  . Diabetes Sister   . Cancer Brother   . Diabetes Sister     Allergies  Allergen Reactions  . Dristan Other (See Comments)    "made my chest tight"  . Robitussin (Alcohol Free) [Guaifenesin] Other (See Comments)    "made my chest tight"  . Ropinirole Hcl Other (See Comments)    "made my chest tight"  . Tramadol Other (See Comments)    "made my chest tight"  Medication list has been reviewed and updated.  Current Outpatient Prescriptions on File Prior to Visit  Medication Sig Dispense Refill  . acetaminophen (TYLENOL ARTHRITIS PAIN) 650 MG CR tablet Take 650 mg by mouth every 8 (eight) hours as needed for pain.       Marland Kitchen albuterol (PROVENTIL HFA;VENTOLIN HFA) 108 (90 BASE) MCG/ACT inhaler Inhale into the lungs. As needed only      . amLODipine (NORVASC) 10 MG tablet Take one tablet daily for hypertension  30 tablet  11  . ergocalciferol (VITAMIN D2) 50000 UNITS capsule Take 1 capsule (50,000 Units total) by mouth once a week.  4 capsule  2  . ferrous sulfate 325 (65 FE) MG tablet take 1 tablet by mouth once daily WITH BREAKFAST  30 tablet  11  . HYDROcodone-acetaminophen (NORCO/VICODIN) 5-325 MG per tablet Take 1 tablet by mouth every 6 (six) hours as needed for pain.  30 tablet  1  . Interferon Beta-1b (BETASERON) 0.3 MG KIT injection Inject 0.25 mg into the skin every  other day.  1 kit  11  . losartan (COZAAR) 50 MG tablet Take 1 tablet (50 mg total) by mouth every morning.  30 tablet  11  . omeprazole (PRILOSEC) 20 MG capsule Take 1 capsule (20 mg total) by mouth daily.  30 capsule  11  . predniSONE (DELTASONE) 20 MG tablet Take 1 tablet (20 mg total) by mouth daily.  10 tablet  0  . ranitidine (ZANTAC) 150 MG tablet take 1 tablet by mouth twice a day  60 tablet  3  . sucralfate (CARAFATE) 1 GM/10ML suspension Take 10 mLs (1 g total) by mouth every 6 (six) hours.  420 mL  2   Current Facility-Administered Medications on File Prior to Visit  Medication Dose Route Frequency Provider Last Rate Last Dose  . cyanocobalamin ((VITAMIN B-12)) injection 1,000 mcg  1,000 mcg Intramuscular Q30 days Anders Simmonds, PA-C   1,000 mcg at 10/30/12 1305  . cyanocobalamin ((VITAMIN B-12)) injection 1,000 mcg  1,000 mcg Intramuscular Q30 days Elvina Sidle, MD   1,000 mcg at 11/29/12 1117    Review of Systems:  As per HPI, otherwise negative.    Physical Examination: Filed Vitals:   04/12/13 0936  BP: 118/70  Pulse: 65  Temp: 98.4 F (36.9 C)  Resp: 18   Filed Vitals:   04/12/13 0936  Height: 5\' 1"  (1.549 m)  Weight: 218 lb (98.884 kg)   Body mass index is 41.21 kg/(m^2). Ideal Body Weight: Weight in (lb) to have BMI = 25: 132   GEN: Obese, NAD, Non-toxic, Alert & Oriented x 3 HEENT: Atraumatic, Normocephalic.  Ears and Nose: No external deformity. EXTR: No clubbing/cyanosis/edema NEURO: Normal gait.  PSYCH: Normally interactive. Conversant. Not depressed or anxious appearing.  Calm demeanor.  KNEE  No effusion, full ROM.  No crepitus, click or locking.    Assessment and Plan: Degenerative arthritis knee celebrex B12 injection  Signed,  Phillips Odor, MD

## 2013-04-12 NOTE — Patient Instructions (Addendum)

## 2013-04-13 ENCOUNTER — Other Ambulatory Visit: Payer: Self-pay | Admitting: Emergency Medicine

## 2013-04-13 NOTE — Progress Notes (Signed)
PA was denied for coverage of Celebrex. Notified pt and she stated that she doesn't want to take it anyway because Dr Cleta Alberts had taken her off of it before for some reason and put her on ibuprofen.

## 2013-04-18 ENCOUNTER — Ambulatory Visit (HOSPITAL_BASED_OUTPATIENT_CLINIC_OR_DEPARTMENT_OTHER): Payer: BC Managed Care – PPO

## 2013-04-18 ENCOUNTER — Ambulatory Visit (HOSPITAL_BASED_OUTPATIENT_CLINIC_OR_DEPARTMENT_OTHER): Payer: BC Managed Care – PPO | Admitting: Hematology & Oncology

## 2013-04-18 ENCOUNTER — Ambulatory Visit (HOSPITAL_BASED_OUTPATIENT_CLINIC_OR_DEPARTMENT_OTHER): Payer: BC Managed Care – PPO | Admitting: Lab

## 2013-04-18 VITALS — BP 130/67 | HR 52 | Temp 98.2°F | Resp 16 | Ht 61.0 in | Wt 218.0 lb

## 2013-04-18 DIAGNOSIS — D509 Iron deficiency anemia, unspecified: Secondary | ICD-10-CM

## 2013-04-18 DIAGNOSIS — C9000 Multiple myeloma not having achieved remission: Secondary | ICD-10-CM

## 2013-04-18 DIAGNOSIS — C9030 Solitary plasmacytoma not having achieved remission: Secondary | ICD-10-CM

## 2013-04-18 LAB — CBC WITH DIFFERENTIAL (CANCER CENTER ONLY)
BASO#: 0 10*3/uL (ref 0.0–0.2)
Eosinophils Absolute: 0.3 10*3/uL (ref 0.0–0.5)
HCT: 36.2 % (ref 34.8–46.6)
HGB: 11.6 g/dL (ref 11.6–15.9)
LYMPH%: 30.8 % (ref 14.0–48.0)
MCH: 30.6 pg (ref 26.0–34.0)
MCV: 96 fL (ref 81–101)
MONO#: 0.5 10*3/uL (ref 0.1–0.9)
MONO%: 10.9 % (ref 0.0–13.0)
Platelets: 178 10*3/uL (ref 145–400)
RBC: 3.79 10*6/uL (ref 3.70–5.32)
WBC: 4.1 10*3/uL (ref 3.9–10.0)

## 2013-04-18 MED ORDER — SODIUM CHLORIDE 0.9 % IV SOLN
INTRAVENOUS | Status: DC
  Administered 2013-04-18: 13:00:00 via INTRAVENOUS

## 2013-04-18 MED ORDER — ZOLEDRONIC ACID 4 MG/100ML IV SOLN
4.0000 mg | Freq: Once | INTRAVENOUS | Status: AC
  Administered 2013-04-18: 4 mg via INTRAVENOUS
  Filled 2013-04-18: qty 100

## 2013-04-18 NOTE — Patient Instructions (Signed)
Zoledronic Acid injection (Hypercalcemia, Oncology) What is this medicine? ZOLEDRONIC ACID (ZOE le dron ik AS id) lowers the amount of calcium loss from bone. It is used to treat too much calcium in your blood from cancer. It is also used to prevent complications of cancer that has spread to the bone. This medicine may be used for other purposes; ask your health care provider or pharmacist if you have questions. What should I tell my health care provider before I take this medicine? They need to know if you have any of these conditions: -aspirin-sensitive asthma -dental disease -kidney disease -an unusual or allergic reaction to zoledronic acid, other medicines, foods, dyes, or preservatives -pregnant or trying to get pregnant -breast-feeding How should I use this medicine? This medicine is for infusion into a vein. It is given by a health care professional in a hospital or clinic setting. Talk to your pediatrician regarding the use of this medicine in children. Special care may be needed. Overdosage: If you think you have taken too much of this medicine contact a poison control center or emergency room at once. NOTE: This medicine is only for you. Do not share this medicine with others. What if I miss a dose? It is important not to miss your dose. Call your doctor or health care professional if you are unable to keep an appointment. What may interact with this medicine? -certain antibiotics given by injection -NSAIDs, medicines for pain and inflammation, like ibuprofen or naproxen -some diuretics like bumetanide, furosemide -teriparatide -thalidomide This list may not describe all possible interactions. Give your health care provider a list of all the medicines, herbs, non-prescription drugs, or dietary supplements you use. Also tell them if you smoke, drink alcohol, or use illegal drugs. Some items may interact with your medicine. What should I watch for while using this medicine? Visit  your doctor or health care professional for regular checkups. It may be some time before you see the benefit from this medicine. Do not stop taking your medicine unless your doctor tells you to. Your doctor may order blood tests or other tests to see how you are doing. Women should inform their doctor if they wish to become pregnant or think they might be pregnant. There is a potential for serious side effects to an unborn child. Talk to your health care professional or pharmacist for more information. You should make sure that you get enough calcium and vitamin D while you are taking this medicine. Discuss the foods you eat and the vitamins you take with your health care professional. Some people who take this medicine have severe bone, joint, and/or muscle pain. This medicine may also increase your risk for a broken thigh bone. Tell your doctor right away if you have pain in your upper leg or groin. Tell your doctor if you have any pain that does not go away or that gets worse. What side effects may I notice from receiving this medicine? Side effects that you should report to your doctor or health care professional as soon as possible: -allergic reactions like skin rash, itching or hives, swelling of the face, lips, or tongue -anxiety, confusion, or depression -breathing problems -changes in vision -feeling faint or lightheaded, falls -jaw burning, cramping, pain -muscle cramps, stiffness, or weakness -trouble passing urine or change in the amount of urine Side effects that usually do not require medical attention (report to your doctor or health care professional if they continue or are bothersome): -bone, joint, or muscle pain -  fever -hair loss -irritation at site where injected -loss of appetite -nausea, vomiting -stomach upset -tired This list may not describe all possible side effects. Call your doctor for medical advice about side effects. You may report side effects to FDA at  1-800-FDA-1088. Where should I keep my medicine? This drug is given in a hospital or clinic and will not be stored at home. NOTE: This sheet is a summary. It may not cover all possible information. If you have questions about this medicine, talk to your doctor, pharmacist, or health care provider.  2013, [employer]. (01/24/2011 9:06:58 AM)  

## 2013-04-18 NOTE — Progress Notes (Signed)
This office note has been dictated.

## 2013-04-19 LAB — FERRITIN CHCC: Ferritin: 347 ng/ml — ABNORMAL HIGH (ref 9–269)

## 2013-04-19 LAB — IRON AND TIBC CHCC: %SAT: 21 % (ref 21–57)

## 2013-04-19 NOTE — Progress Notes (Signed)
CC:   [name]. Morgan Espinoza, M.D.  DIAGNOSES: 1. IgG kappa plasma cytoma. 2. History of iron deficiency anemia.  CURRENT THERAPY: 1. Zometa 4 mg IV q.6 weeks. 2. IV iron as indicated.  INTERIM HISTORY:  Morgan Espinoza comes in for followup.  She is doing okay. She really has had no complaints when I saw her back in July.  When we last saw her, her myeloma studies showed her monoclonal spike to be 0.5 mg/dL.  IgG level was 1480 mg/dL.  These are all stable. She feels good.  She has had no pain.  There is no fatigue or weakness.  She has had no bleeding.  She has had no change in bowel or bladder habits. When we last saw her, her ferritin was 401.  PHYSICAL EXAM:  General:  This is a fairly well-developed, well- nourished African American female in no obvious distress.  Vital signs: Temperature of 98.2, pulse 52, respiratory rate 16, blood pressure 130/67.  Weight is 218 pounds.  Head and neck:  Normocephalic, atraumatic skull.  There are no ocular or oral lesions.  She has no palpable cervical or supraclavicular lymph nodes.  Lungs:  Clear bilaterally.  Cardiac:  Regular rate and rhythm with a normal S1 and S2. There are no murmurs, rubs or bruits.  Abdomen:  Soft.  She has good bowel sounds.  There is no fluid wave.  There is no palpable hepatosplenomegaly.  Back exam:  No tenderness over the spine, ribs, or hips.  Extremities:  Show no clubbing, cyanosis or edema.  Neurological: Shows no focal neurological deficits.  LABORATORY STUDIES:  __________, hematocrit 36.2 and platelet count 176,000.  Sodium 141, potassium 3.7, BUN 10, creatinine 0.46.  Calcium 8.9 with an albumin of 4.0.  Total protein is 6.7.  IMPRESSION:  Morgan Espinoza is a very nice 62 year old black female with IgG kappa plasma cytoma.  One might suspect that she could have smoldering myeloma.  She still has the monoclonal spike, but this is holding stable. She did get radiation therapy.  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  She got treated back in February 2014.  She got 50.4 Gy. For now, we will continue her on the Zometa every 6 weeks. I do not see a need to do anything invasive right now.   ______________________________ Josph Macho, M.D. PRE/MEDQ  D:  04/18/2013  T:  04/19/2013  Job:  [id number]

## 2013-04-20 LAB — COMPREHENSIVE METABOLIC PANEL
ALT: 20 U/L (ref 0–35)
AST: 25 U/L (ref 0–37)
Creatinine, Ser: 0.66 mg/dL (ref 0.50–1.10)
Sodium: 141 mEq/L (ref 135–145)
Total Bilirubin: 0.4 mg/dL (ref 0.3–1.2)
Total Protein: 6.7 g/dL (ref 6.0–8.3)

## 2013-04-20 LAB — PROTEIN ELECTROPHORESIS, SERUM, WITH REFLEX
Albumin ELP: 55.7 % — ABNORMAL LOW (ref 55.8–66.1)
Alpha-1-Globulin: 5.5 % — ABNORMAL HIGH (ref 2.9–4.9)
Beta 2: 5.2 % (ref 3.2–6.5)
Gamma Globulin: 16.9 % (ref 11.1–18.8)

## 2013-04-20 LAB — KAPPA/LAMBDA LIGHT CHAINS
Kappa free light chain: 1.88 mg/dL (ref 0.33–1.94)
Kappa:Lambda Ratio: 0.99 (ref 0.26–1.65)
Lambda Free Lght Chn: 1.89 mg/dL (ref 0.57–2.63)

## 2013-04-20 LAB — IGG, IGA, IGM
IgA: 184 mg/dL (ref 69–380)
IgG (Immunoglobin G), Serum: 1200 mg/dL (ref 690–1700)

## 2013-04-25 ENCOUNTER — Other Ambulatory Visit: Payer: Self-pay | Admitting: Emergency Medicine

## 2013-04-26 ENCOUNTER — Telehealth: Payer: Self-pay | Admitting: Hematology & Oncology

## 2013-04-26 NOTE — Telephone Encounter (Signed)
Left message with 10-17 appointment

## 2013-05-06 ENCOUNTER — Ambulatory Visit (INDEPENDENT_AMBULATORY_CARE_PROVIDER_SITE_OTHER): Payer: BC Managed Care – PPO | Admitting: Emergency Medicine

## 2013-05-06 VITALS — BP 118/80 | HR 55 | Temp 97.9°F | Resp 18 | Ht 61.5 in | Wt 216.0 lb

## 2013-05-06 DIAGNOSIS — E538 Deficiency of other specified B group vitamins: Secondary | ICD-10-CM

## 2013-05-06 DIAGNOSIS — Z23 Encounter for immunization: Secondary | ICD-10-CM

## 2013-05-06 DIAGNOSIS — M25569 Pain in unspecified knee: Secondary | ICD-10-CM

## 2013-05-06 DIAGNOSIS — M25561 Pain in right knee: Secondary | ICD-10-CM

## 2013-05-06 NOTE — Progress Notes (Signed)
  Subjective:    Patient ID: Morgan Espinoza, female    DOB: Jan 30, 1951, 62 y.o.   MRN: [id number]  HPI patient's complicated history. She has a plasmacytoma involving the left second rib. She also suffers from iron deficiency anemia which requires infusions every month. She also  Has a history of B12 deficiency and is on replacement for this. She enters today with complaints of pain in both knees. She was initially prescribed Celebrex which she says she is not able to take but cannot remember why. She has no history of ulcer disease or GI bleeds. She does have hydrocodone to take for severe pain.   Review of Systems     Objective:   Physical Exam patient is alert and cooperative. Her chest was clear. There is tenderness at the costosternal junction of the left second rib. Extremity exam revealed degenerative changes of both knees but no effusions were seen there is mild tenderness adjacent to the patella involving the left knee.        Assessment & Plan:  She will be given a B12 shot today. She was given a flu vaccine today. She is to take Tylenol for pain. She does have hydrocodone to take for severe pain.

## 2013-05-27 ENCOUNTER — Ambulatory Visit (HOSPITAL_BASED_OUTPATIENT_CLINIC_OR_DEPARTMENT_OTHER): Payer: BC Managed Care – PPO | Admitting: Lab

## 2013-05-27 ENCOUNTER — Ambulatory Visit (HOSPITAL_BASED_OUTPATIENT_CLINIC_OR_DEPARTMENT_OTHER): Payer: BC Managed Care – PPO

## 2013-05-27 ENCOUNTER — Ambulatory Visit (HOSPITAL_BASED_OUTPATIENT_CLINIC_OR_DEPARTMENT_OTHER): Payer: BC Managed Care – PPO | Admitting: Hematology & Oncology

## 2013-05-27 VITALS — BP 140/61 | HR 61 | Temp 98.3°F | Resp 14 | Ht 64.0 in | Wt 214.0 lb

## 2013-05-27 DIAGNOSIS — C888 Other malignant immunoproliferative diseases: Secondary | ICD-10-CM

## 2013-05-27 DIAGNOSIS — D509 Iron deficiency anemia, unspecified: Secondary | ICD-10-CM

## 2013-05-27 DIAGNOSIS — C9000 Multiple myeloma not having achieved remission: Secondary | ICD-10-CM

## 2013-05-27 LAB — CBC WITH DIFFERENTIAL (CANCER CENTER ONLY)
BASO#: 0 10*3/uL (ref 0.0–0.2)
EOS%: 4.1 % (ref 0.0–7.0)
HGB: 11.8 g/dL (ref 11.6–15.9)
MCH: 30.6 pg (ref 26.0–34.0)
MCHC: 32.6 g/dL (ref 32.0–36.0)
MONO%: 14.9 % — ABNORMAL HIGH (ref 0.0–13.0)
NEUT#: 2.9 10*3/uL (ref 1.5–6.5)
Platelets: DECREASED 10*3/uL (ref 145–400)
RDW: 12.2 % (ref 11.1–15.7)

## 2013-05-27 MED ORDER — ZOLEDRONIC ACID 4 MG/100ML IV SOLN
4.0000 mg | Freq: Once | INTRAVENOUS | Status: AC
  Administered 2013-05-27: 4 mg via INTRAVENOUS
  Filled 2013-05-27: qty 100

## 2013-05-27 MED ORDER — SODIUM CHLORIDE 0.9 % IV SOLN
Freq: Once | INTRAVENOUS | Status: AC
  Administered 2013-05-27: 10:00:00 via INTRAVENOUS

## 2013-05-27 NOTE — Patient Instructions (Signed)
Zoledronic Acid injection (Hypercalcemia, Oncology) What is this medicine? ZOLEDRONIC ACID (ZOE le dron ik AS id) lowers the amount of calcium loss from bone. It is used to treat too much calcium in your blood from cancer. It is also used to prevent complications of cancer that has spread to the bone. This medicine may be used for other purposes; ask your health care provider or pharmacist if you have questions. What should I tell my health care provider before I take this medicine? They need to know if you have any of these conditions: -aspirin-sensitive asthma -dental disease -kidney disease -an unusual or allergic reaction to zoledronic acid, other medicines, foods, dyes, or preservatives -pregnant or trying to get pregnant -breast-feeding How should I use this medicine? This medicine is for infusion into a vein. It is given by a health care professional in a hospital or clinic setting. Talk to your pediatrician regarding the use of this medicine in children. Special care may be needed. Overdosage: If you think you have taken too much of this medicine contact a poison control center or emergency room at once. NOTE: This medicine is only for you. Do not share this medicine with others. What if I miss a dose? It is important not to miss your dose. Call your doctor or health care professional if you are unable to keep an appointment. What may interact with this medicine? -certain antibiotics given by injection -NSAIDs, medicines for pain and inflammation, like ibuprofen or naproxen -some diuretics like bumetanide, furosemide -teriparatide -thalidomide This list may not describe all possible interactions. Give your health care provider a list of all the medicines, herbs, non-prescription drugs, or dietary supplements you use. Also tell them if you smoke, drink alcohol, or use illegal drugs. Some items may interact with your medicine. What should I watch for while using this medicine? Visit  your doctor or health care professional for regular checkups. It may be some time before you see the benefit from this medicine. Do not stop taking your medicine unless your doctor tells you to. Your doctor may order blood tests or other tests to see how you are doing. Women should inform their doctor if they wish to become pregnant or think they might be pregnant. There is a potential for serious side effects to an unborn child. Talk to your health care professional or pharmacist for more information. You should make sure that you get enough calcium and vitamin D while you are taking this medicine. Discuss the foods you eat and the vitamins you take with your health care professional. Some people who take this medicine have severe bone, joint, and/or muscle pain. This medicine may also increase your risk for a broken thigh bone. Tell your doctor right away if you have pain in your upper leg or groin. Tell your doctor if you have any pain that does not go away or that gets worse. What side effects may I notice from receiving this medicine? Side effects that you should report to your doctor or health care professional as soon as possible: -allergic reactions like skin rash, itching or hives, swelling of the face, lips, or tongue -anxiety, confusion, or depression -breathing problems -changes in vision -feeling faint or lightheaded, falls -jaw burning, cramping, pain -muscle cramps, stiffness, or weakness -trouble passing urine or change in the amount of urine Side effects that usually do not require medical attention (report to your doctor or health care professional if they continue or are bothersome): -bone, joint, or muscle pain -  fever -hair loss -irritation at site where injected -loss of appetite -nausea, vomiting -stomach upset -tired This list may not describe all possible side effects. Call your doctor for medical advice about side effects. You may report side effects to FDA at  1-800-FDA-1088. Where should I keep my medicine? This drug is given in a hospital or clinic and will not be stored at home. NOTE: This sheet is a summary. It may not cover all possible information. If you have questions about this medicine, talk to your doctor, pharmacist, or health care provider.  2012, Elsevier/Gold Standard. (01/24/2011 9:06:58 AM) 

## 2013-05-27 NOTE — Progress Notes (Signed)
This office note has been dictated.

## 2013-05-28 NOTE — Progress Notes (Signed)
CC:   [name]. Cleta Alberts, M.D.  DIAGNOSES: 1. IgG kappa plasmacytoma. 2. History of iron deficiency anemia.  CURRENT THERAPY: 1. Zometa 4 mg IV q.6 weeks. 2. IV iron as indicated.  INTERIM HISTORY:  Ms. Pouliot comes in for followup.  She is doing fairly well.  She has had no specific complaints since we last saw her.  I last saw her back in early September.  When we last saw her, her monoclonal spike was 0.29 g/dL.  IgG level was 1200 mg/dL.  Kappa light chain 1.88 mg/dL.  Ferritin was 347 with iron saturation of 21%.  She has had no problems with bleeding or bruising.  There is no change in bowel or bladder habits.  She is not complaining of any kind of pain. There is no cough or shortness of breath.  She has had no change in her medications.  She is getting ready for the holidays coming up.  She is looking forward to the holidays.  PHYSICAL EXAMINATION:  General:  This is a well-developed, well- nourished, African American female in no obvious distress.  Vital signs: Temperature of 98.3, pulse 61, respiratory rate 14, blood pressure 140/61.  Weight is 214 pounds.  Head and neck exam shows a normocephalic, atraumatic skull.  There are no ocular or oral lesions. There are no palpable cervical or supraclavicular lymph nodes.  Lungs: Clear bilaterally.  Cardiac exam:  Regular rate and rhythm with a normal S1 and S2.  There are no murmurs, rubs or bruits.  Abdomen:  Soft.  She has good bowel sounds.  She is mildly obese.  She has no fluid wave. There is no palpable hepatosplenomegaly.  Extremities:  Show no clubbing, cyanosis or edema.  LABORATORY STUDIES:  White cell count is 4.1, hemoglobin 11.6, hematocrit 36.2, platelet count 178.  MCV 96.  On her peripheral smear I do not see any anisocytosis or poikilocytosis. She had no target cells.  There is no rouleaux formation.  White cells appear normal in morphology and maturation.  There are no plasma cells. I see no atypical  lymphocytes.  Platelets are adequate in number and size.  IMPRESSION:  Ms. Jimmerson is very charming, 62 year old African female with a plasmacytoma.  This was of the left rib cage.  She underwent treatment with radiation to the left 2nd rib back in February 2014.  She got 50.4 Gy.  We will go ahead and plan for her Zometa today.  I think we can probably hold off on iron for her.  Her iron saturation was 21%, so we have to be careful with this.  We will plan to get her back in 6 more weeks.  We will try to get her through the holidays in good shape.    ______________________________ Josph Macho, M.D. PRE/MEDQ  D:  05/27/2013  T:  05/28/2013  Job:  [id number]

## 2013-06-09 ENCOUNTER — Encounter: Payer: Self-pay | Admitting: Emergency Medicine

## 2013-06-26 ENCOUNTER — Other Ambulatory Visit: Payer: Self-pay | Admitting: Emergency Medicine

## 2013-07-08 ENCOUNTER — Telehealth: Payer: Self-pay | Admitting: Hematology & Oncology

## 2013-07-08 NOTE — Telephone Encounter (Signed)
Pt called today to cancel, due to other conflicting appts (her heart doctor) she has on the same day. Will cb Monday to reschedule.

## 2013-07-11 ENCOUNTER — Other Ambulatory Visit: Payer: BC Managed Care – PPO | Admitting: Lab

## 2013-07-11 ENCOUNTER — Ambulatory Visit: Payer: BC Managed Care – PPO

## 2013-07-11 ENCOUNTER — Telehealth: Payer: Self-pay | Admitting: Hematology & Oncology

## 2013-07-11 ENCOUNTER — Ambulatory Visit: Payer: BC Managed Care – PPO | Admitting: Hematology & Oncology

## 2013-07-11 NOTE — Telephone Encounter (Signed)
Pt made 12-4 appointment

## 2013-07-14 ENCOUNTER — Ambulatory Visit (HOSPITAL_BASED_OUTPATIENT_CLINIC_OR_DEPARTMENT_OTHER): Payer: BC Managed Care – PPO | Admitting: Hematology & Oncology

## 2013-07-14 ENCOUNTER — Ambulatory Visit (HOSPITAL_BASED_OUTPATIENT_CLINIC_OR_DEPARTMENT_OTHER): Payer: BC Managed Care – PPO

## 2013-07-14 ENCOUNTER — Ambulatory Visit (HOSPITAL_BASED_OUTPATIENT_CLINIC_OR_DEPARTMENT_OTHER): Payer: BC Managed Care – PPO | Admitting: Lab

## 2013-07-14 VITALS — BP 110/58 | HR 58 | Temp 98.1°F | Resp 16 | Wt 209.0 lb

## 2013-07-14 DIAGNOSIS — C9000 Multiple myeloma not having achieved remission: Secondary | ICD-10-CM

## 2013-07-14 DIAGNOSIS — D509 Iron deficiency anemia, unspecified: Secondary | ICD-10-CM

## 2013-07-14 DIAGNOSIS — C9030 Solitary plasmacytoma not having achieved remission: Secondary | ICD-10-CM

## 2013-07-14 LAB — CMP (CANCER CENTER ONLY)
ALT(SGPT): 28 U/L (ref 10–47)
Albumin: 3.4 g/dL (ref 3.3–5.5)
Alkaline Phosphatase: 67 U/L (ref 26–84)
BUN, Bld: 20 mg/dL (ref 7–22)
CO2: 33 mEq/L (ref 18–33)
Calcium: 8.8 mg/dL (ref 8.0–10.3)
Chloride: 110 mEq/L — ABNORMAL HIGH (ref 98–108)
Glucose, Bld: 89 mg/dL (ref 73–118)
Potassium: 4 mEq/L (ref 3.3–4.7)
Sodium: 146 mEq/L — ABNORMAL HIGH (ref 128–145)

## 2013-07-14 LAB — CBC WITH DIFFERENTIAL (CANCER CENTER ONLY)
BASO#: 0 10*3/uL (ref 0.0–0.2)
BASO%: 0.5 % (ref 0.0–2.0)
LYMPH#: 1.3 10*3/uL (ref 0.9–3.3)
LYMPH%: 30 % (ref 14.0–48.0)
MONO#: 0.4 10*3/uL (ref 0.1–0.9)
NEUT#: 2.4 10*3/uL (ref 1.5–6.5)
Platelets: DECREASED 10*3/uL (ref 145–400)
RDW: 12.4 % (ref 11.1–15.7)
WBC: 4.4 10*3/uL (ref 3.9–10.0)

## 2013-07-14 MED ORDER — ZOLEDRONIC ACID 4 MG/100ML IV SOLN
4.0000 mg | Freq: Once | INTRAVENOUS | Status: AC
  Administered 2013-07-14: 4 mg via INTRAVENOUS
  Filled 2013-07-14: qty 100

## 2013-07-14 NOTE — Progress Notes (Signed)
This office note has been dictated.

## 2013-07-14 NOTE — Patient Instructions (Signed)

## 2013-07-15 ENCOUNTER — Other Ambulatory Visit: Payer: Self-pay | Admitting: Emergency Medicine

## 2013-07-17 ENCOUNTER — Encounter (INDEPENDENT_AMBULATORY_CARE_PROVIDER_SITE_OTHER): Payer: BC Managed Care – PPO | Admitting: Emergency Medicine

## 2013-07-17 DIAGNOSIS — E539 Vitamin B deficiency, unspecified: Secondary | ICD-10-CM

## 2013-07-17 MED ORDER — CYANOCOBALAMIN 1000 MCG/ML IJ SOLN
1000.0000 ug | Freq: Once | INTRAMUSCULAR | Status: AC
  Administered 2013-07-17: 1000 ug via INTRAMUSCULAR

## 2013-07-18 LAB — IGG, IGA, IGM
IgG (Immunoglobin G), Serum: 1240 mg/dL (ref 690–1700)
IgM, Serum: 67 mg/dL (ref 52–322)

## 2013-07-18 LAB — PROTEIN ELECTROPHORESIS, SERUM, WITH REFLEX
Albumin ELP: 56.4 % (ref 55.8–66.1)
Beta 2: 5.1 % (ref 3.2–6.5)
Beta Globulin: 5.6 % (ref 4.7–7.2)
Total Protein, Serum Electrophoresis: 7 g/dL (ref 6.0–8.3)

## 2013-07-18 LAB — TRANSFERRIN RECEPTOR, SOLUABLE: Transferrin Receptor, Soluble: 1.48 mg/L (ref 0.76–1.76)

## 2013-07-18 LAB — IFE INTERPRETATION

## 2013-07-18 LAB — KAPPA/LAMBDA LIGHT CHAINS: Kappa free light chain: 2.17 mg/dL — ABNORMAL HIGH (ref 0.33–1.94)

## 2013-07-18 NOTE — Progress Notes (Signed)
CC:   Morgan Espinoza. Morgan Espinoza, M.D.  DIAGNOSES: 1. IgG kappa plasmacytoma. 2. History of iron deficiency anemia.  CURRENT THERAPY: 1. Zometa 4 mg IV every 6 weeks. 2. IV iron as needed.  INTERIM HISTORY:  Morgan Espinoza comes in for followup.  She is doing quite well.  She feels well.  She is really not complaining of any kind of bony pain.  She does well with the Zometa without any complications.  She last got iron from [place] back in December of 2013.  In fact, when I saw her in September, her ferritin was 347 with an iron saturation of 21%.  When we last saw her in September, her monoclonal spike was 0.29 g/dL. Her IgG level was 1200 mg/dL and kappa light chain was 1.88 mg/dL.  She has had no problems with bowels or bladder.  There has been no bleeding or bruising.  She has had no leg swelling.  There have been no rashes.  I told her to start taking 1 baby aspirin a day.  I think this will be helpful for her just with overall health issues.  PHYSICAL EXAMINATION:  General:  This is a well-developed, well- nourished African American female in no obvious distress.  Vital Signs: Temperature of 98.1, pulse 58, respiratory rate 16, blood pressure 110/58.  Weight is 209 pounds.  Espinoza and Neck:  Normocephalic, atraumatic skull.  There are no ocular or oral lesions.  There are no palpable cervical or supraclavicular lymph nodes.  Lungs:  Clear bilaterally.  Cardiac:  Regular rate and rhythm with a normal S1 and S2. There are no murmurs, rubs, or bruits.  Abdomen:  Soft.  She has good bowel sounds.  There is no fluid wave.  There is no palpable hepatosplenomegaly.  Back:  Some slight tenderness to palpation over the left mid lateral chest wall.  She has no swelling.  There is no erythema or warmth.  Extremities:  No clubbing, cyanosis, or edema.  She has good range of motion of her joints.  She has good strength in her arms and legs.  Neurological:  No focal neurological deficits.  LABORATORY  STUDIES:  White cell count 4.4, hemoglobin 11.4, hematocrit 35.6, platelet count was clumped.  IMPRESSION:  Morgan Espinoza is a very nice 62 year old African American female with an IgG kappa plasmacytoma.  She did receive radiation therapy for this.  There is still a small monoclonal spike.  This really has not had any change with it.  So I feel confident that we can just follow along without having to treat for myeloma.  Of note, she completed her radiation back in February of this year.  She received 5000 rads.  We will go ahead and plan to get her back to see [place] in another 6 weeks.  I still do not see a need for any scans that need to be done.    ______________________________ Josph Macho, M.D. PRE/MEDQ  D:  07/14/2013  T:  07/18/2013  Job:  [id number]

## 2013-07-20 ENCOUNTER — Ambulatory Visit (INDEPENDENT_AMBULATORY_CARE_PROVIDER_SITE_OTHER): Payer: BC Managed Care – PPO | Admitting: Emergency Medicine

## 2013-07-20 VITALS — BP 152/86 | HR 73 | Temp 98.0°F | Resp 16 | Ht 62.0 in | Wt 210.4 lb

## 2013-07-20 DIAGNOSIS — R350 Frequency of micturition: Secondary | ICD-10-CM

## 2013-07-20 DIAGNOSIS — N390 Urinary tract infection, site not specified: Secondary | ICD-10-CM

## 2013-07-20 DIAGNOSIS — M25562 Pain in left knee: Secondary | ICD-10-CM

## 2013-07-20 DIAGNOSIS — M25569 Pain in unspecified knee: Secondary | ICD-10-CM

## 2013-07-20 LAB — POCT URINALYSIS DIPSTICK
Bilirubin, UA: NEGATIVE
Glucose, UA: NEGATIVE
Leukocytes, UA: NEGATIVE
Nitrite, UA: NEGATIVE
Urobilinogen, UA: 2
pH, UA: 6

## 2013-07-20 LAB — POCT UA - MICROSCOPIC ONLY
Casts, Ur, LPF, POC: NEGATIVE
Crystals, Ur, HPF, POC: NEGATIVE
Mucus, UA: NEGATIVE
Yeast, UA: NEGATIVE

## 2013-07-20 MED ORDER — DICLOFENAC SODIUM 1 % TD GEL: TRANSDERMAL | Status: DC

## 2013-07-20 MED ORDER — CIPROFLOXACIN HCL 250 MG PO TABS: 250.0000 mg | ORAL_TABLET | Freq: Two times a day (BID) | ORAL | Status: DC

## 2013-07-20 NOTE — Progress Notes (Signed)
   Subjective:    Patient ID: Morgan Espinoza, female    DOB: [birth year], 62 y.o.   MRN: [id number]  HPI Pt reports nocturia...gets up 4-5 times at night. No dysuria. No frequency during the day. She states when she urinates, it is the normal amount. SHe says it has a strong odor. Pt says she has not had many uti's before.     Review of Systems she is also having pain and discomfort in the medial portion of the left knee. She has pain with ambulation. There has been no locking or instability      Objective:   Physical Exam there is mild discomfort at cross the mid back. The abdomen itself is soft there are no areas of tenderness no masses are felt  Results for orders placed in visit on 07/20/13  POCT UA - MICROSCOPIC ONLY      Result Value Range   WBC, Ur, HPF, POC 5-6     RBC, urine, microscopic neg     Bacteria, U Microscopic trace     Mucus, UA neg     Epithelial cells, urine per micros TNTC     Crystals, Ur, HPF, POC neg     Casts, Ur, LPF, POC neg     Yeast, UA neg    POCT URINALYSIS DIPSTICK      Result Value Range   Color, UA yellow     Clarity, UA clear     Glucose, UA neg     Bilirubin, UA neg     Ketones, UA neg     Spec Grav, UA 1.020     Blood, UA neg     pH, UA 6.0     Protein, UA 30     Urobilinogen, UA 2.0     Nitrite, UA neg     Leukocytes, UA Negative          Assessment & Plan:  Urine culture was done she does have 5-6 white.Marland KitchenMarland KitchenVoltaren gel to knee BID. She has a history of allergy to Celebrex but has taken Advil and Aleve without difficulty. No antibiotics were prescribed. We'll wait on culture results

## 2013-07-21 LAB — URINE CULTURE: Colony Count: 90000

## 2013-07-25 NOTE — Progress Notes (Signed)
PA approved for voltaren gel through 07/25/14. Notified pharm.

## 2013-07-28 ENCOUNTER — Other Ambulatory Visit: Payer: Self-pay | Admitting: Emergency Medicine

## 2013-07-28 NOTE — Telephone Encounter (Signed)
Dr Cleta Alberts, you just saw this pt last week, but don't see a visit for anything heart/BP related for several month. Do you want to RF or does she need to RTC for BP f/up?

## 2013-08-01 ENCOUNTER — Other Ambulatory Visit: Payer: Self-pay | Admitting: *Deleted

## 2013-08-01 MED ORDER — AMLODIPINE BESYLATE 10 MG PO TABS: ORAL_TABLET | ORAL | Status: DC

## 2013-08-08 ENCOUNTER — Other Ambulatory Visit: Payer: Self-pay

## 2013-08-08 DIAGNOSIS — Z1231 Encounter for screening mammogram for malignant neoplasm of breast: Secondary | ICD-10-CM

## 2013-08-10 ENCOUNTER — Ambulatory Visit (INDEPENDENT_AMBULATORY_CARE_PROVIDER_SITE_OTHER): Payer: BC Managed Care – PPO | Admitting: Emergency Medicine

## 2013-08-10 VITALS — BP 136/80 | HR 74 | Temp 98.1°F | Resp 18 | Ht 61.0 in | Wt 207.0 lb

## 2013-08-10 DIAGNOSIS — E538 Deficiency of other specified B group vitamins: Secondary | ICD-10-CM

## 2013-08-10 DIAGNOSIS — R319 Hematuria, unspecified: Secondary | ICD-10-CM

## 2013-08-10 DIAGNOSIS — R310 Gross hematuria: Secondary | ICD-10-CM

## 2013-08-10 DIAGNOSIS — E531 Pyridoxine deficiency: Secondary | ICD-10-CM

## 2013-08-10 DIAGNOSIS — R350 Frequency of micturition: Secondary | ICD-10-CM

## 2013-08-10 LAB — POCT UA - MICROSCOPIC ONLY
Casts, Ur, LPF, POC: NEGATIVE
Mucus, UA: NEGATIVE
Yeast, UA: NEGATIVE

## 2013-08-10 LAB — POCT URINALYSIS DIPSTICK
Ketones, UA: NEGATIVE
Leukocytes, UA: NEGATIVE
Nitrite, UA: NEGATIVE
Protein, UA: NEGATIVE
Urobilinogen, UA: 1
pH, UA: 6.5

## 2013-08-10 MED ORDER — CYANOCOBALAMIN 1000 MCG/ML IJ SOLN
1000.0000 ug | Freq: Once | INTRAMUSCULAR | Status: AC
  Administered 2013-08-10: 1000 ug via INTRAMUSCULAR

## 2013-08-10 NOTE — Progress Notes (Signed)
Subjective:    Patient ID: Morgan Espinoza, female    DOB: 02/12/1951, 62 y.o.   MRN: [id number] Scribed for Lesle Chris MD, the patient was seen in room 12. This chart was scribed by Lewanda Rife, ED scribe. Patient's care was started at 11:18 AM  HPI Comments: Morgan Espinoza is a 62 y.o. female who presents to the [facility] complaining of constant mild greater left toe pain onset last night. Denies any aggravating or alleviated factors. Denies associated any recent injuries.   Additionally, reports urinary frequency onset last night. Denies any aggravating or alleviating factors. Denies associated fever, and abdominal pain.   Past Medical History  Diagnosis Date  . Hypertension   . GERD (gastroesophageal reflux disease)   . Asthma   . Multiple sclerosis   . Hyperlipidemia   . Vertigo   . Vitamin D deficiency   . Obesity   . Achilles tendonitis   . OA (osteoarthritis) of knee   . Anemia, iron deficiency 07/23/2012  . 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 Cancer 05/2012    plasma cell myeloma  . S/P radiation therapy 08/12/12 - 09/21/12    left Anterior 2nd Rib Lesion / 50.4 GY / 28 Fractions    Past Surgical History  Procedure Laterality Date  . Cholecystectomy      lap choley  . Abdominal hysterectomy      fibroids  . Breast surgery      left breast biopsy-benign  . Rotator cuff repair    . Bone biopsy  05/17/12    Left 2nd Rib, Plasma Cell Myeloma    Family History  Problem Relation Age of Onset  . Dementia Mother   . Hypertension Mother   . Cancer Father     lung; +tobacco  . Diabetes Sister   . Cancer Brother   . Diabetes Sister     History   Social History  . Marital Status: Widowed    Spouse Name: N/A    Number of Children: N/A  . Years of Education: N/A   Occupational History  . Not on file.   Social History Main Topics  . Smoking status: Former Smoker -- 0.10 packs/day    Types: Cigarettes    Quit date:  04/19/1979  . Smokeless tobacco: Never Used     Comment: in teens, only passive  . Alcohol Use: No  . Drug Use: No  . Sexual Activity: No   Other Topics Concern  . Not on file   Social History Narrative   Retired: Bus Driver for 28 years    Allergies  Allergen Reactions  . Celebrex [Celecoxib]   . Dristan Other (See Comments)    "made my chest tight"  . Robitussin (Alcohol Free) [Guaifenesin] Other (See Comments)    "made my chest tight"  . Ropinirole Hcl Other (See Comments)    "made my chest tight"  . Tramadol Other (See Comments)    "made my chest tight"    Patient Active Problem List   Diagnosis Date Noted  . Encounter for long-term (current) use of other medications 01/28/2013  . 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 Multiple sclerosis   . Anemia   . Hyperlipidemia   . Vertigo   . Vitamin D deficiency   . Obesity   . Achilles tendonitis   . OA (osteoarthritis) of knee   . Anemia, iron deficiency 07/23/2012  . Myeloma 07/12/2012  . Plasma cell  myeloma 05/17/2012  . Cancer 05/11/2012  . Rib lesion 04/28/2012  . Chest pain 06/05/2011  . Hypertension 06/05/2011  . Asthma 06/05/2011  . GERD (gastroesophageal reflux disease) 06/05/2011  . MS (multiple sclerosis) 06/05/2011     HPI    Review of Systems  Constitutional: Negative for fever.  Genitourinary: Positive for frequency.  Psychiatric/Behavioral: Negative for confusion.       Objective:   Physical Exam  Physical Exam  Nursing note and vitals reviewed. Constitutional: She is oriented to person, place, and time. She appears well-developed and well-nourished. No distress.  HENT:  Head: Normocephalic and atraumatic.  Eyes: EOM are normal.  Neck: Neck supple. No tracheal deviation present.  Cardiovascular: Normal rate.   Pulmonary/Chest: Effort normal. No respiratory distress.  Musculoskeletal: Normal range of motion.  Neurological: She is alert and oriented to person, place, and time.  Skin: Skin is  warm and dry.  she has an ingrown toenail but not severe. Cotton was placed under the nail plate medially and laterally without difficulty.  Psychiatric: She has a normal mood and affect. Her behavior is normal.  Results for orders placed in visit on 08/10/13  POCT URINALYSIS DIPSTICK      Result Value Range   Color, UA yellow     Clarity, UA clear     Glucose, UA neg     Bilirubin, UA neg     Ketones, UA neg     Spec Grav, UA 1.020     Blood, UA small     pH, UA 6.5     Protein, UA neg     Urobilinogen, UA 1.0     Nitrite, UA neg     Leukocytes, UA Negative    POCT UA - MICROSCOPIC ONLY      Result Value Range   WBC, Ur, HPF, POC 0-1     RBC, urine, microscopic 4-6     Bacteria, U Microscopic 1+     Mucus, UA neg     Epithelial cells, urine per micros 3-5     Crystals, Ur, HPF, POC neg     Casts, Ur, LPF, POC neg     Yeast, UA neg          Assessment & Plan:  Make sure you drink plenty of fluids. Your urine showed some bacteria and so a culture was done. There was a small amount of blood in your urine and we need to repeat this on her followup visit the I personally performed the services described in this documentation, which was scribed in my presence. The recorded information has been reviewed and is accurate.

## 2013-08-10 NOTE — Patient Instructions (Signed)
Please have a repeat your urine on her next followup visit. There was a small amount of blood in your urine. A urine culture was done .

## 2013-08-12 LAB — URINE CULTURE: Colony Count: 3000

## 2013-08-17 ENCOUNTER — Other Ambulatory Visit: Payer: Self-pay | Admitting: Emergency Medicine

## 2013-08-18 ENCOUNTER — Other Ambulatory Visit: Payer: Self-pay | Admitting: Emergency Medicine

## 2013-08-25 ENCOUNTER — Encounter: Payer: Self-pay | Admitting: Hematology & Oncology

## 2013-08-25 ENCOUNTER — Ambulatory Visit (HOSPITAL_BASED_OUTPATIENT_CLINIC_OR_DEPARTMENT_OTHER): Payer: BC Managed Care – PPO | Admitting: Lab

## 2013-08-25 ENCOUNTER — Ambulatory Visit (HOSPITAL_BASED_OUTPATIENT_CLINIC_OR_DEPARTMENT_OTHER): Payer: BC Managed Care – PPO | Admitting: Hematology & Oncology

## 2013-08-25 ENCOUNTER — Ambulatory Visit (HOSPITAL_BASED_OUTPATIENT_CLINIC_OR_DEPARTMENT_OTHER): Payer: BC Managed Care – PPO

## 2013-08-25 VITALS — BP 136/58 | HR 55 | Temp 97.9°F | Resp 14 | Ht 62.0 in | Wt 209.0 lb

## 2013-08-25 DIAGNOSIS — C9000 Multiple myeloma not having achieved remission: Secondary | ICD-10-CM

## 2013-08-25 DIAGNOSIS — D509 Iron deficiency anemia, unspecified: Secondary | ICD-10-CM

## 2013-08-25 LAB — CBC WITH DIFFERENTIAL (CANCER CENTER ONLY)
BASO#: 0 10*3/uL (ref 0.0–0.2)
BASO%: 0.2 % (ref 0.0–2.0)
EOS ABS: 0.2 10*3/uL (ref 0.0–0.5)
EOS%: 5.6 % (ref 0.0–7.0)
HEMATOCRIT: 34.5 % — AB (ref 34.8–46.6)
HEMOGLOBIN: 10.9 g/dL — AB (ref 11.6–15.9)
LYMPH#: 1.2 10*3/uL (ref 0.9–3.3)
LYMPH%: 27.4 % (ref 14.0–48.0)
MCH: 29.9 pg (ref 26.0–34.0)
MCHC: 31.6 g/dL — AB (ref 32.0–36.0)
MCV: 95 fL (ref 81–101)
MONO#: 0.6 10*3/uL (ref 0.1–0.9)
MONO%: 12.9 % (ref 0.0–13.0)
NEUT#: 2.3 10*3/uL (ref 1.5–6.5)
NEUT%: 53.9 % (ref 39.6–80.0)
Platelets: 178 10*3/uL (ref 145–400)
RBC: 3.64 10*6/uL — AB (ref 3.70–5.32)
RDW: 12.6 % (ref 11.1–15.7)
WBC: 4.3 10*3/uL (ref 3.9–10.0)

## 2013-08-25 LAB — CHCC SATELLITE - SMEAR

## 2013-08-25 MED ORDER — SODIUM CHLORIDE 0.9 % IV SOLN
1020.0000 mg | Freq: Once | INTRAVENOUS | Status: AC
  Administered 2013-08-25: 1020 mg via INTRAVENOUS
  Filled 2013-08-25: qty 34

## 2013-08-25 MED ORDER — ZOLEDRONIC ACID 4 MG/100ML IV SOLN
4.0000 mg | Freq: Once | INTRAVENOUS | Status: AC
  Administered 2013-08-25: 4 mg via INTRAVENOUS
  Filled 2013-08-25: qty 100

## 2013-08-25 NOTE — Progress Notes (Signed)
This office note has been dictated.

## 2013-08-25 NOTE — Patient Instructions (Signed)
Zoledronic Acid injection (Hypercalcemia, Oncology) What is this medicine? ZOLEDRONIC ACID (ZOE le dron ik AS id) lowers the amount of calcium loss from bone. It is used to treat too much calcium in your blood from cancer. It is also used to prevent complications of cancer that has spread to the bone. This medicine may be used for other purposes; ask your health care provider or pharmacist if you have questions. COMMON BRAND NAME(S): Zometa What should I tell my health care provider before I take this medicine? They need to know if you have any of these conditions: -aspirin-sensitive asthma -cancer, especially if you are receiving medicines used to treat cancer -dental disease or wear dentures -infection -kidney disease -receiving corticosteroids like dexamethasone or prednisone -an unusual or allergic reaction to zoledronic acid, other medicines, foods, dyes, or preservatives -pregnant or trying to get pregnant -breast-feeding How should I use this medicine? This medicine is for infusion into a vein. It is given by a health care professional in a hospital or clinic setting. Talk to your pediatrician regarding the use of this medicine in children. Special care may be needed. Overdosage: If you think you have taken too much of this medicine contact a poison control center or emergency room at once. NOTE: This medicine is only for you. Do not share this medicine with others. What if I miss a dose? It is important not to miss your dose. Call your doctor or health care professional if you are unable to keep an appointment. What may interact with this medicine? -certain antibiotics given by injection -NSAIDs, medicines for pain and inflammation, like ibuprofen or naproxen -some diuretics like bumetanide, furosemide -teriparatide -thalidomide This list may not describe all possible interactions. Give your health care provider a list of all the medicines, herbs, non-prescription drugs, or  dietary supplements you use. Also tell them if you smoke, drink alcohol, or use illegal drugs. Some items may interact with your medicine. What should I watch for while using this medicine? Visit your doctor or health care professional for regular checkups. It may be some time before you see the benefit from this medicine. Do not stop taking your medicine unless your doctor tells you to. Your doctor may order blood tests or other tests to see how you are doing. Women should inform their doctor if they wish to become pregnant or think they might be pregnant. There is a potential for serious side effects to an unborn child. Talk to your health care professional or pharmacist for more information. You should make sure that you get enough calcium and vitamin D while you are taking this medicine. Discuss the foods you eat and the vitamins you take with your health care professional. Some people who take this medicine have severe bone, joint, and/or muscle pain. This medicine may also increase your risk for jaw problems or a broken thigh bone. Tell your doctor right away if you have severe pain in your jaw, bones, joints, or muscles. Tell your doctor if you have any pain that does not go away or that gets worse. Tell your dentist and dental surgeon that you are taking this medicine. You should not have major dental surgery while on this medicine. See your dentist to have a dental exam and fix any dental problems before starting this medicine. Take good care of your teeth while on this medicine. Make sure you see your dentist for regular follow-up appointments. What side effects may I notice from receiving this medicine? Side effects that   you should report to your doctor or health care professional as soon as possible: -allergic reactions like skin rash, itching or hives, swelling of the face, lips, or tongue -anxiety, confusion, or depression -breathing problems -changes in vision -eye pain -feeling faint or  lightheaded, falls -jaw pain, especially after dental work -mouth sores -muscle cramps, stiffness, or weakness -trouble passing urine or change in the amount of urine Side effects that usually do not require medical attention (report to your doctor or health care professional if they continue or are bothersome): -bone, joint, or muscle pain -constipation -diarrhea -fever -hair loss -irritation at site where injected -loss of appetite -nausea, vomiting -stomach upset -trouble sleeping -trouble swallowing -weak or tired This list may not describe all possible side effects. Call your doctor for medical advice about side effects. You may report side effects to FDA at 1-800-FDA-1088. Where should I keep my medicine? This drug is given in a hospital or clinic and will not be stored at home. NOTE: This sheet is a summary. It may not cover all possible information. If you have questions about this medicine, talk to your doctor, pharmacist, or health care provider.  2014, Elsevier/Gold Standard. (2013-01-06 13:03:13) Ferumoxytol injection What is this medicine? FERUMOXYTOL is an iron complex. Iron is used to make healthy red blood cells, which carry oxygen and nutrients throughout the body. This medicine is used to treat iron deficiency anemia in people with chronic kidney disease. This medicine may be used for other purposes; ask your health care provider or pharmacist if you have questions. COMMON BRAND NAME(S): Feraheme  What should I tell my health care provider before I take this medicine? They need to know if you have any of these conditions: -anemia not caused by low iron levels -high levels of iron in the blood -magnetic resonance imaging (MRI) test scheduled -an unusual or allergic reaction to iron, other medicines, foods, dyes, or preservatives -pregnant or trying to get pregnant -breast-feeding How should I use this medicine? This medicine is for injection into a vein. It is  given by a health care professional in a hospital or clinic setting. Talk to your pediatrician regarding the use of this medicine in children. Special care may be needed. Overdosage: If you think you've taken too much of this medicine contact a poison control center or emergency room at once. Overdosage: If you think you have taken too much of this medicine contact a poison control center or emergency room at once. NOTE: This medicine is only for you. Do not share this medicine with others. What if I miss a dose? It is important not to miss your dose. Call your doctor or health care professional if you are unable to keep an appointment. What may interact with this medicine? This medicine may interact with the following medications: -other iron products This list may not describe all possible interactions. Give your health care provider a list of all the medicines, herbs, non-prescription drugs, or dietary supplements you use. Also tell them if you smoke, drink alcohol, or use illegal drugs. Some items may interact with your medicine. What should I watch for while using this medicine? Visit your doctor or healthcare professional regularly. Tell your doctor or healthcare professional if your symptoms do not start to get better or if they get worse. You may need blood work done while you are taking this medicine. You may need to follow a special diet. Talk to your doctor. Foods that contain iron include: whole grains/cereals, dried fruits,  beans, or peas, leafy green vegetables, and organ meats (liver, kidney). What side effects may I notice from receiving this medicine? Side effects that you should report to your doctor or health care professional as soon as possible: -allergic reactions like skin rash, itching or hives, swelling of the face, lips, or tongue -breathing problems -changes in blood pressure -feeling faint or lightheaded, falls -fever or chills -flushing, sweating, or hot  feelings -swelling of the ankles or feet Side effects that usually do not require medical attention (Report these to your doctor or health care professional if they continue or are bothersome.): -diarrhea -headache -nausea, vomiting -stomach pain This list may not describe all possible side effects. Call your doctor for medical advice about side effects. You may report side effects to FDA at 1-800-FDA-1088. Where should I keep my medicine? This drug is given in a hospital or clinic and will not be stored at home. NOTE: This sheet is a summary. It may not cover all possible information. If you have questions about this medicine, talk to your doctor, pharmacist, or health care provider.  2014, Elsevier/Gold Standard. (2012-03-12 15:23:36)

## 2013-08-26 LAB — IRON AND TIBC CHCC
%SAT: 21 % (ref 21–57)
IRON: 60 ug/dL (ref 41–142)
TIBC: 279 ug/dL (ref 236–444)
UIBC: 219 ug/dL (ref 120–384)

## 2013-08-26 LAB — FERRITIN CHCC: FERRITIN: 409 ng/mL — AB (ref 9–269)

## 2013-08-26 NOTE — Progress Notes (Signed)
CC:   Morgan Espinoza. Morgan Espinoza, M.D.  DIAGNOSES: 1. IgG kappa plasmacytoma. 2. Iron-deficiency anemia.  CURRENT THERAPY: 1. Zometa 4 mg IV q.6 weeks. 2. IV iron as indicated -- the patient to receive a dose of Feraheme     today at 1020 mg.  INTERIM HISTORY:  Morgan Espinoza comes in for followup.  She is doing quite well.  She has no specific complaints.  She may feel a little bit more fatigued.  She may have a little bit of low energy.  She has gained some weight.  She lost 10 pounds during the holidays, but gained it back.  She has not noted any obvious bleeding issues.  There has been no change in bowel or bladder habits.  When we last saw her in December, there was no monoclonal spike in her serum.  She has had no leg swelling.  She has had no rashes.  She has had no headaches.  She continues on quite a few medications.  She does have multiple sclerosis and does take interferon every other day.  PHYSICAL EXAMINATION:  General:  This is a fairly well-developed, well- nourished, African American female, in no obvious distress.  Vital Signs:  Temperature of 97.9, pulse 55, respiratory rate 14, blood pressure is 136/58.  Weight is 209 pounds.  Head and Neck: Normocephalic, atraumatic skull.  There are no ocular or oral lesions. There are no palpable cervical or supraclavicular lymph nodes.  Lungs: Clear bilaterally.  Cardiac:  Regular rate and rhythm with a normal S1, S2.  There are no murmurs, rubs, or bruits.  Abdomen:  Soft.  She has good bowel sounds.  There is no palpable abdominal mass.  There is no fluid wave.  There is no palpable hepatosplenomegaly.  Back:  No tenderness over the spine, ribs, or hips.  Extremities:  No clubbing, cyanosis, or edema.  She has good range motion of her joints.  She has good muscle strength in upper and lower extremities bilaterally. Neurological:  No focal neurological deficits.  LABORATORY STUDIES:  White cell count is 4.3, hemoglobin  10.9, hematocrit 34.5, platelet count 178.  MCV is 95.  On peripheral smear she does have some microcytic red cells.  There is some mild anisocytosis.  I see no nucleated red cells.  There is no teardrop cells.  I see no Rouleaux formation.  White cells appear normal in morphology and maturation.  There is no immature myeloid or lymphoid forms.  Platelets are adequate in number and size.  IMPRESSION:  Morgan Espinoza is a very nice 63 year old African American female with an IgG kappa plasmacytoma.  This involved her ribcage.  She had 5000 rad of radiation that was completed back in February of 2014.  Again, I do not see any obvious evidence of myeloma progression, and she has no monoclonal spike in her  serum.  We will continue the Zometa every 6 weeks for right now.  We will go ahead and give her a dose of IV iron today.  I think she would benefit from this.  Her last iron saturation was only 21%.  Her ferritin is trending downward.  I think all this is consistent with low iron.  We will go ahead and plan to get her back in another 6 weeks to see [place]. Hopefully, we will find that her hemoglobin is better.    ______________________________ [name], M.D. PRE/MEDQ  D:  08/25/2013  T:  08/26/2013  Job:  [id number]

## 2013-08-29 LAB — COMPREHENSIVE METABOLIC PANEL
ALBUMIN: 3.8 g/dL (ref 3.5–5.2)
ALT: 24 U/L (ref 0–35)
AST: 28 U/L (ref 0–37)
Alkaline Phosphatase: 57 U/L (ref 39–117)
BUN: 15 mg/dL (ref 6–23)
CHLORIDE: 104 meq/L (ref 96–112)
CO2: 27 mEq/L (ref 19–32)
CREATININE: 0.88 mg/dL (ref 0.50–1.10)
Calcium: 8.8 mg/dL (ref 8.4–10.5)
GLUCOSE: 83 mg/dL (ref 70–99)
Potassium: 4.1 mEq/L (ref 3.5–5.3)
Sodium: 142 mEq/L (ref 135–145)
Total Bilirubin: 0.4 mg/dL (ref 0.3–1.2)
Total Protein: 6.8 g/dL (ref 6.0–8.3)

## 2013-08-29 LAB — KAPPA/LAMBDA LIGHT CHAINS
KAPPA LAMBDA RATIO: 1.06 (ref 0.26–1.65)
Kappa free light chain: 2.5 mg/dL — ABNORMAL HIGH (ref 0.33–1.94)
Lambda Free Lght Chn: 2.35 mg/dL (ref 0.57–2.63)

## 2013-08-29 LAB — IGG, IGA, IGM
IGA: 175 mg/dL (ref 69–380)
IgG (Immunoglobin G), Serum: 1170 mg/dL (ref 690–1700)
IgM, Serum: 64 mg/dL (ref 52–322)

## 2013-08-29 LAB — PROTEIN ELECTROPHORESIS, SERUM, WITH REFLEX
ALPHA-1-GLOBULIN: 5.4 % — AB (ref 2.9–4.9)
ALPHA-2-GLOBULIN: 10.2 % (ref 7.1–11.8)
Albumin ELP: 56.1 % (ref 55.8–66.1)
BETA GLOBULIN: 6.1 % (ref 4.7–7.2)
Beta 2: 5.3 % (ref 3.2–6.5)
GAMMA GLOBULIN: 16.9 % (ref 11.1–18.8)
Total Protein, Serum Electrophoresis: 6.8 g/dL (ref 6.0–8.3)

## 2013-08-29 LAB — LACTATE DEHYDROGENASE: LDH: 246 U/L (ref 94–250)

## 2013-08-29 LAB — IFE INTERPRETATION

## 2013-08-30 ENCOUNTER — Telehealth: Payer: Self-pay

## 2013-08-30 NOTE — Telephone Encounter (Addendum)
Message copied by Jennell Corner on Tue Aug 30, 2013 12:16 PM ------      Message from: Burney Gauze R      Created: Mon Aug 29, 2013 10:01 PM       Call - no myeloma protein is noted!! Katrinka Blazing ------ Patient was call regarding her result above and she was happy with her result.

## 2013-09-02 ENCOUNTER — Ambulatory Visit: Payer: BC Managed Care – PPO

## 2013-09-02 ENCOUNTER — Ambulatory Visit (INDEPENDENT_AMBULATORY_CARE_PROVIDER_SITE_OTHER): Payer: BC Managed Care – PPO | Admitting: Emergency Medicine

## 2013-09-02 VITALS — BP 110/66 | HR 74 | Temp 98.4°F | Resp 18 | Ht 61.5 in | Wt 205.0 lb

## 2013-09-02 DIAGNOSIS — M5410 Radiculopathy, site unspecified: Secondary | ICD-10-CM

## 2013-09-02 DIAGNOSIS — M5412 Radiculopathy, cervical region: Secondary | ICD-10-CM

## 2013-09-02 DIAGNOSIS — M542 Cervicalgia: Secondary | ICD-10-CM

## 2013-09-02 DIAGNOSIS — E538 Deficiency of other specified B group vitamins: Secondary | ICD-10-CM

## 2013-09-02 DIAGNOSIS — C9000 Multiple myeloma not having achieved remission: Secondary | ICD-10-CM

## 2013-09-02 MED ORDER — CYANOCOBALAMIN 1000 MCG/ML IJ SOLN
1000.0000 ug | Freq: Once | INTRAMUSCULAR | Status: AC
  Administered 2013-09-02: 1000 ug via INTRAMUSCULAR

## 2013-09-02 NOTE — Progress Notes (Signed)
   Subjective:   Patient ID: Morgan Espinoza, female    DOB: [birth year], 63 y.o.   MRN: [id number]  This chart was scribed for Arlyss Queen, MD by Elby Beck, Scribe. This patient was seen in room 4 and the patient's care was started at 11:32 AM.  Chief Complaint  Patient presents with  . Shoulder Pain    left since yesterday    HPI  HPI Comments: Morgan Espinoza is a 63 y.o. female who presents to [facility] complaining of a brief shooting pain in her neck that radiated to her left upper arm that occurred when she leaned her head back at the beauty shop yesterday. She states that when she woke up this morning and had another similar episode of pain, prompting her to be evaluated today She reports that she has no prior history of similar pain. She denies any other pain or symptoms. She is requesting a shot of B12 today.   Review of Systems  Musculoskeletal: Positive for neck pain.       Left upper arm pain      BP 110/66  Pulse 74  Temp(Src) 98.4 F (36.9 C) (Oral)  Resp 18  Ht 5' 1.5" (1.562 m)  Wt 205 lb (92.987 kg)  BMI 38.11 kg/m2  SpO2 97% Objective:   Physical Exam CONSTITUTIONAL: Well developed/well nourished HEAD: Normocephalic/atraumatic EYES: EOMI/PERRL ENMT: Mucous membranes moist NECK: supple no meningeal signs SPINE:entire spine nontender CV: S1/S2 noted, no murmurs/rubs/gallops noted LUNGS: Lungs are clear to auscultation bilaterally, no apparent distress ABDOMEN: soft, nontender, no rebound or guarding GU:no cva tenderness NEURO: Pt is awake/alert, moves all extremitiesx4 EXTREMITIES: pulses normal, full ROM SKIN: warm, color normal PSYCH: no abnormalities of mood noted There is tenderness left side of the neck deep tendon reflexes are 2+ and symmetrical to   [facility] reading (PRIMARY) by  Dr. Everlene Farrier is a spur present at C5-C6 with mild degenerative disc disease    Assessment & Plan:  You can take Tylenol for pain. Recheck if worsening of her  symptoms. B12 shot given to the  I personally performed the services described in this documentation, which was scribed in my presence. The recorded information has been reviewed and is accurate.

## 2013-09-03 ENCOUNTER — Other Ambulatory Visit: Payer: Self-pay | Admitting: Emergency Medicine

## 2013-09-07 ENCOUNTER — Telehealth: Payer: Self-pay | Admitting: Nurse Practitioner

## 2013-09-07 NOTE — Telephone Encounter (Addendum)
Message copied by Jimmy Footman on Wed Sep 07, 2013  1:08 PM ------      Message from: Burney Gauze R      Created: Mon Sep 05, 2013 10:03 PM       Call - neck looks ok!!  Some arthritis.  No cancer!!  Laurey Arrow ------Pt verbalized understanding and appreciation.

## 2013-09-11 ENCOUNTER — Other Ambulatory Visit: Payer: Self-pay | Admitting: Emergency Medicine

## 2013-09-16 ENCOUNTER — Other Ambulatory Visit: Payer: Self-pay | Admitting: Physician Assistant

## 2013-09-29 ENCOUNTER — Ambulatory Visit (INDEPENDENT_AMBULATORY_CARE_PROVIDER_SITE_OTHER): Payer: BC Managed Care – PPO | Admitting: Emergency Medicine

## 2013-09-29 VITALS — BP 140/72 | HR 61 | Temp 98.4°F | Resp 18 | Wt 209.0 lb

## 2013-09-29 DIAGNOSIS — L600 Ingrowing nail: Secondary | ICD-10-CM

## 2013-09-29 DIAGNOSIS — E538 Deficiency of other specified B group vitamins: Secondary | ICD-10-CM

## 2013-09-29 MED ORDER — RANITIDINE HCL 150 MG PO TABS: ORAL_TABLET | ORAL | Status: DC

## 2013-09-29 MED ORDER — MUPIROCIN 2 % EX OINT: TOPICAL_OINTMENT | CUTANEOUS | Status: DC

## 2013-09-29 MED ORDER — LOSARTAN POTASSIUM 50 MG PO TABS: ORAL_TABLET | ORAL | Status: DC

## 2013-09-29 NOTE — Progress Notes (Signed)
 Subjective:    Patient ID: Morgan Espinoza, female    DOB: 06/30/1951, 63 y.o.   MRN: [id number] This chart was scribed for  , MD by Caroline Early, ED Scribe. This patient was seen in room 12 and the patient's care was started at 2:10 PM.  Chief Complaint  Patient presents with  . Toe Pain    great toe, left foot  . Medication Refill    losartan, zantac   HPI HPI Comments: Morgan Espinoza is a 63 y.o. female who presents to the [facility] complaining of constant left great toe pain. She has been using antibiotic ointment. She states she needs a refill on her losartan and zantac. She is asking for a B12 shot, last shot was end of December.   PCP - , STEVE A, MD  Past Medical History  Diagnosis Date  . Hypertension   . GERD (gastroesophageal reflux disease)   . Asthma   . Multiple sclerosis   . Hyperlipidemia   . Vertigo   . Vitamin D deficiency   . Obesity   . Achilles tendonitis   . OA (osteoarthritis) of knee   . Anemia, iron deficiency 07/23/2012  . 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 Cancer 05/2012    plasma cell myeloma  . S/P radiation therapy 08/12/12 - 09/21/12    left Anterior 2nd Rib Lesion / 50.4 GY / 28 Fractions   Current Outpatient Prescriptions on File Prior to Visit  Medication Sig Dispense Refill  . albuterol (PROVENTIL HFA;VENTOLIN HFA) 108 (90 BASE) MCG/ACT inhaler Inhale into the lungs. As needed only      . amLODipine (NORVASC) 10 MG tablet take 1 tablet by mouth once daily for hypertension  30 tablet  5  . aspirin 81 MG tablet Take 81 mg by mouth daily.      . CARAFATE 1 GM/10ML suspension take 2 teaspoonfuls by mouth every 6 hours  420 mL  2  . diclofenac sodium (VOLTAREN) 1 % GEL Applied 2 g to the left knee 3 times a day  100 g  2  . docusate sodium (COLACE) 100 MG capsule Take 100 mg by mouth as needed for constipation.      . ferrous sulfate 325 (65 FE) MG tablet take 1 tablet by mouth once daily WITH  BREAKFAST  30 tablet  11  . HYDROcodone-acetaminophen (NORCO/VICODIN) 5-325 MG per tablet Take 1 tablet by mouth every 6 (six) hours as needed for pain.  30 tablet  1  . Interferon Beta-1b (BETASERON) 0.3 MG KIT injection Inject 0.25 mg into the skin every other day.  1 kit  11  . losartan (COZAAR) 50 MG tablet Take 1 tablet (50 mg total) by mouth daily. PATIENT NEEDS BP CHECK UP FOR ADDITIONAL REFILLS  30 tablet  0  . metoprolol succinate (TOPROL-XL) 25 MG 24 hr tablet Take 25 mg by mouth daily.       . omeprazole (PRILOSEC) 20 MG capsule take 1 capsule by mouth once daily  30 capsule  2  . ranitidine (ZANTAC) 150 MG tablet Take 1 tablet (150 mg total) by mouth 2 (two) times daily. PATIENT NEEDS OFFICE VISIT FOR ADDITIONAL REFILLS  60 tablet  0  . ciprofloxacin (CILOXAN) 0.3 % ophthalmic solution Place 1 drop into both eyes as needed. Administer 1 drop, every 2 hours, while awake, for 2 days. Then 1 drop, every 4 hours, while awake, for the   next 5 days.       Current Facility-Administered Medications on File Prior to Visit  Medication Dose Route Frequency Provider Last Rate Last Dose  . cyanocobalamin ((VITAMIN B-12)) injection 1,000 mcg  1,000 mcg Intramuscular Q30 days Angela M McClung, PA-C   1,000 mcg at 05/06/13 1136   Allergies  Allergen Reactions  . Celebrex [Celecoxib]   . Dristan Other (See Comments)    "made my chest tight"  . Robitussin (Alcohol Free) [Guaifenesin] Other (See Comments)    "made my chest tight"  . Ropinirole Hcl Other (See Comments)    "made my chest tight"  . Tramadol Other (See Comments)    "made my chest tight"     Review of Systems     Objective:   Physical Exam CONSTITUTIONAL: Well developed/well nourished HEAD: Normocephalic/atraumatic EYES: EOMI/PERRL ENMT: Mucous membranes moist NECK: supple no meningeal signs SPINE:entire spine nontender CV: S1/S2 noted, no murmurs/rubs/gallops noted LUNGS: Lungs are clear to auscultation bilaterally, no  apparent distress ABDOMEN: soft, nontender, no rebound or guarding GU:no cva tenderness NEURO: Pt is awake/alert, moves all extremitiesx4 EXTREMITIES: pulses normal, full ROM SKIN: warm, color normal. cracking of the skin medial side of the right great toe. PSYCH: no abnormalities of mood noted   Filed Vitals:   09/29/13 1351  BP: 140/72  Pulse: 61  Temp: 98.4 F (36.9 C)  TempSrc: Oral  Resp: 18  Weight: 209 lb (94.802 kg)  SpO2: 97%        Assessment & Plan:  B12 shot will be given today her ranitidine and  losartan were refilled. I personally performed the services described in this documentation, which was scribed in my presence. The recorded information has been reviewed and is accurate. **Disclaimer: This note was dictated with voice recognition software. Similar sounding words can inadvertently be transcribed and this note may contain transcription errors which may not have been corrected upon publication of note.** Results for orders placed in visit on 08/25/13  CBC WITH DIFFERENTIAL ([facility])      Result Value Ref Range   WBC 4.3  3.9 - 10.0 10e3/uL   RBC 3.64 (*) 3.70 - 5.32 10e6/uL   HGB 10.9 (*) 11.6 - 15.9 g/dL   HCT 34.5 (*) 34.8 - 46.6 %   MCV 95  81 - 101 fL   MCH 29.9  26.0 - 34.0 pg   MCHC 31.6 (*) 32.0 - 36.0 g/dL   RDW 12.6  11.1 - 15.7 %   Platelets 178  145 - 400 10e3/uL   NEUT# 2.3  1.5 - 6.5 10e3/uL   LYMPH# 1.2  0.9 - 3.3 10e3/uL   MONO# 0.6  0.1 - 0.9 10e3/uL   Eosinophils Absolute 0.2  0.0 - 0.5 10e3/uL   BASO# 0.0  0.0 - 0.2 10e3/uL   NEUT% 53.9  39.6 - 80.0 %   LYMPH% 27.4  14.0 - 48.0 %   MONO% 12.9  0.0 - 13.0 %   EOS% 5.6  0.0 - 7.0 %   BASO% 0.2  0.0 - 2.0 %  COMPREHENSIVE METABOLIC PANEL      Result Value Ref Range   Sodium 142  135 - 145 mEq/L   Potassium 4.1  3.5 - 5.3 mEq/L   Chloride 104  96 - 112 mEq/L   CO2 27  19 - 32 mEq/L   Glucose, Bld 83  70 - 99 mg/dL   BUN 15  6 - 23 mg/dL   Creatinine, Ser 0.88    0.50 - 1.10  mg/dL   Total Bilirubin 0.4  0.3 - 1.2 mg/dL   Alkaline Phosphatase 57  39 - 117 U/L   AST 28  0 - 37 U/L   ALT 24  0 - 35 U/L   Total Protein 6.8  6.0 - 8.3 g/dL   Albumin 3.8  3.5 - 5.2 g/dL   Calcium 8.8  8.4 - 10.5 mg/dL  LACTATE DEHYDROGENASE      Result Value Ref Range   LDH 246  94 - 250 U/L  IGG, IGA, IGM      Result Value Ref Range   IgG (Immunoglobin G), Serum 1170  690 - 1700 mg/dL   IgA 175  69 - 380 mg/dL   IgM, Serum 64  52 - 322 mg/dL  KAPPA/LAMBDA LIGHT CHAINS      Result Value Ref Range   Kappa free light chain 2.50 (*) 0.33 - 1.94 mg/dL   Lambda Free Lght Chn 2.35  0.57 - 2.63 mg/dL   Kappa:Lambda Ratio 1.06  0.26 - 1.65  PROTEIN ELECTROPHORESIS, SERUM, WITH REFLEX      Result Value Ref Range   Total Protein, Serum Electrophoresis 6.8  6.0 - 8.3 g/dL   Albumin ELP 56.1  55.8 - 66.1 %   Alpha-1-Globulin 5.4 (*) 2.9 - 4.9 %   Alpha-2-Globulin 10.2  7.1 - 11.8 %   Beta Globulin 6.1  4.7 - 7.2 %   Beta 2 5.3  3.2 - 6.5 %   Gamma Globulin 16.9  11.1 - 18.8 %   M-Spike, % NOT DET     SPE Interp. *     COMMENT (PROTEIN ELECTROPHOR) *    IRON AND TIBC [facility]      Result Value Ref Range   Iron 60  41 - 142 ug/dL   TIBC 279  236 - 444 ug/dL   UIBC 219  120 - 384 ug/dL   %SAT 21  21 - 57 %  FERRITIN [facility]      Result Value Ref Range   Ferritin 409 (*) 9 - 269 ng/ml  [facility] - SMEAR      Result Value Ref Range   Smear Result Smear Available    IFE INTERPRETATION      Result Value Ref Range   Immunofix Electr Int *

## 2013-10-05 ENCOUNTER — Ambulatory Visit (HOSPITAL_BASED_OUTPATIENT_CLINIC_OR_DEPARTMENT_OTHER): Payer: BC Managed Care – PPO | Admitting: Lab

## 2013-10-05 ENCOUNTER — Ambulatory Visit (HOSPITAL_BASED_OUTPATIENT_CLINIC_OR_DEPARTMENT_OTHER): Payer: BC Managed Care – PPO | Admitting: Hematology & Oncology

## 2013-10-05 ENCOUNTER — Encounter: Payer: Self-pay | Admitting: Hematology & Oncology

## 2013-10-05 ENCOUNTER — Ambulatory Visit (HOSPITAL_BASED_OUTPATIENT_CLINIC_OR_DEPARTMENT_OTHER): Payer: BC Managed Care – PPO

## 2013-10-05 VITALS — BP 126/66 | HR 53 | Temp 98.2°F | Resp 14 | Ht 62.0 in | Wt 206.0 lb

## 2013-10-05 DIAGNOSIS — C9000 Multiple myeloma not having achieved remission: Secondary | ICD-10-CM

## 2013-10-05 DIAGNOSIS — D509 Iron deficiency anemia, unspecified: Secondary | ICD-10-CM

## 2013-10-05 LAB — CMP (CANCER CENTER ONLY)
ALBUMIN: 3.6 g/dL (ref 3.3–5.5)
ALT(SGPT): 33 U/L (ref 10–47)
AST: 37 U/L (ref 11–38)
Alkaline Phosphatase: 61 U/L (ref 26–84)
BUN, Bld: 15 mg/dL (ref 7–22)
CO2: 30 mEq/L (ref 18–33)
CREATININE: 0.7 mg/dL (ref 0.6–1.2)
Calcium: 8.6 mg/dL (ref 8.0–10.3)
Chloride: 106 mEq/L (ref 98–108)
Glucose, Bld: 83 mg/dL (ref 73–118)
POTASSIUM: 4 meq/L (ref 3.3–4.7)
Sodium: 140 mEq/L (ref 128–145)
Total Bilirubin: 0.7 mg/dl (ref 0.20–1.60)
Total Protein: 7 g/dL (ref 6.4–8.1)

## 2013-10-05 LAB — CHCC SATELLITE - SMEAR

## 2013-10-05 LAB — CBC WITH DIFFERENTIAL (CANCER CENTER ONLY)
BASO#: 0 10*3/uL (ref 0.0–0.2)
BASO%: 0.2 % (ref 0.0–2.0)
EOS ABS: 0.2 10*3/uL (ref 0.0–0.5)
EOS%: 4.4 % (ref 0.0–7.0)
HEMATOCRIT: 37.4 % (ref 34.8–46.6)
HEMOGLOBIN: 12.1 g/dL (ref 11.6–15.9)
LYMPH#: 1.6 10*3/uL (ref 0.9–3.3)
LYMPH%: 36.1 % (ref 14.0–48.0)
MCH: 30.3 pg (ref 26.0–34.0)
MCHC: 32.4 g/dL (ref 32.0–36.0)
MCV: 94 fL (ref 81–101)
MONO#: 0.4 10*3/uL (ref 0.1–0.9)
MONO%: 9.7 % (ref 0.0–13.0)
NEUT#: 2.2 10*3/uL (ref 1.5–6.5)
NEUT%: 49.6 % (ref 39.6–80.0)
Platelets: DECREASED 10*3/uL (ref 145–400)
RBC: 3.99 10*6/uL (ref 3.70–5.32)
RDW: 12.8 % (ref 11.1–15.7)
WBC: 4.5 10*3/uL (ref 3.9–10.0)

## 2013-10-05 LAB — IRON AND TIBC CHCC
%SAT: 40 % (ref 21–57)
Iron: 109 ug/dL (ref 41–142)
TIBC: 270 ug/dL (ref 236–444)
UIBC: 161 ug/dL (ref 120–384)

## 2013-10-05 LAB — FERRITIN CHCC: Ferritin: 1086 ng/ml — ABNORMAL HIGH (ref 9–269)

## 2013-10-05 MED ORDER — ZOLEDRONIC ACID 4 MG/100ML IV SOLN
4.0000 mg | Freq: Once | INTRAVENOUS | Status: AC
  Administered 2013-10-05: 4 mg via INTRAVENOUS
  Filled 2013-10-05: qty 100

## 2013-10-05 NOTE — Progress Notes (Signed)
  DIAGNOSIS:  IgG Kappa plasmacytoma  Iron deficiency anemia   CURRENT THERAPY:  Zometa 4 mg IV q. 2 months IV iron-last received Feraheme in January with a dose of 1020 mg  INTERIM HISTORY:  Morgan Espinoza comes in for followup. She continues to do well. She's had no specific complaints. She's had no weakness. She's had no cough or shortness of breath.    We last checked her monoclonal studies in January. She did not have a monoclonal spike in her serum. We checked her iron studies. Her ferritin was 409. Iron saturation was 21%. We did give her some iron.    She's had no bleeding. There's been no change in bowel or bladder habits. She's had a good appetite. There's been no leg swelling. Senna rashes.   PHYSICAL EXAMINATION:  Well-developed well-nourished African American female in no obvious distress. Vital signs are temperature of 98.2 pulse 53 heart rate 40 blood pressure 126/66 weight is 206 pounds    Head and neck exam shows no ocular or oral lesions. There is no scleral icterus. There is no adenopathy in the neck. Lungs are clear. Cardiac exam regular in rhythm with a normal S1-S2. There are no murmurs rubs or bruits. Abdomen is soft. She is good bowel sounds. There is no fluid wave. There is no palpable hepato- [place] megaly back exam no tenderness over the spine ribs or hips. Extremities shows no clubbing cyanosis or edema. Neurological exam shows no focal neurological deficits.   LABORATORY STUDIES:  White cell count 4.5. Hemoglobin 12.1. Platelet count was 178.   IMPRESSION:  Morgan Espinoza is a 63 year old Afro-American female. She presented with an IgG kappa plasmocytoma in the left second rib. She had a full workup. No obvious myeloma was noted.    She received 5000 rads to the left rib that was completed in February of 2014.    I then we can move her Zometa appointments out to every 2 months.    I do not see need to do any bone marrow test or urine studies right  now.       [name], MD 10/05/2013

## 2013-10-05 NOTE — Patient Instructions (Signed)

## 2013-10-10 LAB — PROTEIN ELECTROPHORESIS, SERUM, WITH REFLEX
ALPHA-2-GLOBULIN: 11.5 % (ref 7.1–11.8)
Albumin ELP: 54.1 % — ABNORMAL LOW (ref 55.8–66.1)
Alpha-1-Globulin: 5.6 % — ABNORMAL HIGH (ref 2.9–4.9)
Beta 2: 4.9 % (ref 3.2–6.5)
Beta Globulin: 6.2 % (ref 4.7–7.2)
GAMMA GLOBULIN: 17.7 % (ref 11.1–18.8)
Total Protein, Serum Electrophoresis: 6.9 g/dL (ref 6.0–8.3)

## 2013-10-10 LAB — IGG, IGA, IGM
IGA: 229 mg/dL (ref 69–380)
IGG (IMMUNOGLOBIN G), SERUM: 1190 mg/dL (ref 690–1700)
IGM, SERUM: 69 mg/dL (ref 52–322)

## 2013-10-10 LAB — KAPPA/LAMBDA LIGHT CHAINS
KAPPA LAMBDA RATIO: 0.89 (ref 0.26–1.65)
Kappa free light chain: 2.11 mg/dL — ABNORMAL HIGH (ref 0.33–1.94)
LAMBDA FREE LGHT CHN: 2.38 mg/dL (ref 0.57–2.63)

## 2013-10-10 LAB — RETICULOCYTES (CHCC)
ABS Retic: 36.5 10*3/uL (ref 19.0–186.0)
RBC.: 4.06 MIL/uL (ref 3.87–5.11)
Retic Ct Pct: 0.9 % (ref 0.4–2.3)

## 2013-10-10 LAB — ERYTHROPOIETIN: ERYTHROPOIETIN: 14 m[IU]/mL (ref 2.6–18.5)

## 2013-10-10 LAB — IFE INTERPRETATION

## 2013-10-17 ENCOUNTER — Other Ambulatory Visit: Payer: Self-pay | Admitting: Physician Assistant

## 2013-10-20 ENCOUNTER — Other Ambulatory Visit: Payer: Self-pay | Admitting: Physician Assistant

## 2013-11-02 ENCOUNTER — Ambulatory Visit (INDEPENDENT_AMBULATORY_CARE_PROVIDER_SITE_OTHER): Payer: BC Managed Care – PPO | Admitting: Emergency Medicine

## 2013-11-02 VITALS — BP 140/80 | HR 58 | Temp 97.8°F | Resp 16 | Ht 61.5 in | Wt 206.0 lb

## 2013-11-02 DIAGNOSIS — R319 Hematuria, unspecified: Secondary | ICD-10-CM

## 2013-11-02 DIAGNOSIS — E538 Deficiency of other specified B group vitamins: Secondary | ICD-10-CM

## 2013-11-02 DIAGNOSIS — Z90710 Acquired absence of both cervix and uterus: Secondary | ICD-10-CM

## 2013-11-02 LAB — POCT URINALYSIS DIPSTICK
BILIRUBIN UA: NEGATIVE
Glucose, UA: NEGATIVE
Ketones, UA: NEGATIVE
Leukocytes, UA: NEGATIVE
Nitrite, UA: NEGATIVE
PROTEIN UA: 30
SPEC GRAV UA: 1.025
Urobilinogen, UA: 1
pH, UA: 6.5

## 2013-11-02 LAB — POCT UA - MICROSCOPIC ONLY
Casts, Ur, LPF, POC: NEGATIVE
Crystals, Ur, HPF, POC: NEGATIVE
MUCUS UA: NEGATIVE
YEAST UA: NEGATIVE

## 2013-11-02 LAB — IFOBT (OCCULT BLOOD): IFOBT: NEGATIVE

## 2013-11-02 MED ORDER — CYANOCOBALAMIN 1000 MCG/ML IJ SOLN
1000.0000 ug | Freq: Once | INTRAMUSCULAR | Status: AC
  Administered 2013-11-02: 1000 ug via INTRAMUSCULAR

## 2013-11-02 NOTE — Progress Notes (Addendum)
Subjective:    Patient ID: Morgan Espinoza, female    DOB: October 04, 1950, 63 y.o.   MRN: [id number]  HPI This chart was scribed for Remo Lipps Tyler Cubit-MD, by Lovena Le Day, Scribe. This patient was seen in room 14 and the patient's care was started at 12:42 PM.  HPI Comments: Morgan Espinoza is a 63 y.o. female who presents to the [facility] for a recent cough as well as a few other updates.  1. Cough  2. Wants to have a Pap smear  3. She reports hematuria recently. Will collect and inspect a urine specimen  4. B12 Injection  Past Medical History  Diagnosis Date  . Hypertension   . GERD (gastroesophageal reflux disease)   . Asthma   . Multiple sclerosis   . Hyperlipidemia   . Vertigo   . Vitamin D deficiency   . Obesity   . Achilles tendonitis   . OA (osteoarthritis) of knee   . Anemia, iron deficiency 07/23/2012  . 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 Cancer 05/2012    plasma cell myeloma  . S/P radiation therapy 08/12/12 - 09/21/12    left Anterior 2nd Rib Lesion / 50.4 GY / 28 Fractions    Allergies  Allergen Reactions  . Celebrex [Celecoxib]   . Dristan Other (See Comments)    "made my chest tight"  . Robitussin (Alcohol Free) [Guaifenesin] Other (See Comments)    "made my chest tight"  . Ropinirole Hcl Other (See Comments)    "made my chest tight"  . Tramadol Other (See Comments)    "made my chest tight"    Meds ordered this encounter  Medications  . fluticasone (FLOVENT DISKUS) 50 MCG/BLIST diskus inhaler    Sig: Inhale 1 puff into the lungs 2 (two) times daily.    Triage Vitals: BP 140/80  Pulse 58  Temp(Src) 97.8 F (36.6 C) (Oral)  Resp 16  Ht 5' 1.5" (1.562 m)  Wt 206 lb (93.441 kg)  BMI 38.30 kg/m2  SpO2 97%  Review of Systems  Constitutional: Negative for fever and chills.  Respiratory: Positive for cough. Negative for shortness of breath.   Cardiovascular: Negative for chest pain.  Gastrointestinal: Negative for abdominal  pain.  Genitourinary: Positive for hematuria.  Musculoskeletal: Negative for back pain.      Objective:   Physical Exam Physical Exam  Nursing note and vitals reviewed. Constitutional: Patient is oriented to person, place, and time. Patient appears well-developed and well-nourished. No distress.  HENT:  Head: Normocephalic and atraumatic.  Neck: Neck supple. No tracheal deviation present.  Cardiovascular: Normal rate, regular rhythm and normal heart sounds.   No murmur heard. Pulmonary/Chest: Effort normal and breath sounds normal. No respiratory distress. Patient has no wheezes. Patient has no rales.  Musculoskeletal: Normal range of motion.  Neurological: Patient is alert and oriented to person, place, and time.  Skin: Skin is warm and dry.  Psychiatric: Patient has a normal mood and affect. Patient's behavior is normal.  GU exam reveals a surgically absent uterus. There is evidence of atrophic vaginitis. She has a moderate rectocele. There are no adnexal masses felt rectal exam revealed an empty rectal vault without masses.    Results for orders placed in visit on 11/02/13  POCT URINALYSIS DIPSTICK      Result Value Ref Range   Color, UA yellow     Clarity, UA cloudy     Glucose, UA neg  Bilirubin, UA neg     Ketones, UA neg     Spec Grav, UA 1.025     Blood, UA small     pH, UA 6.5     Protein, UA 30     Urobilinogen, UA 1.0     Nitrite, UA neg     Leukocytes, UA Negative    POCT UA - MICROSCOPIC ONLY      Result Value Ref Range   WBC, Ur, HPF, POC 0-1     RBC, urine, microscopic 8-10     Bacteria, U Microscopic trace     Mucus, UA neg     Epithelial cells, urine per micros 3-5     Crystals, Ur, HPF, POC neg     Casts, Ur, LPF, POC neg     Yeast, UA neg    IFOBT (OCCULT BLOOD)      Result Value Ref Range   IFOBT Negative      Assessment & Plan:  DIAGNOSTIC STUDIES: Oxygen Saturation is 97% on room air, normal by my interpretation. Pap smear was done today.  She was given B12 injection. Referral made to urology because of hematuria. Urine culture ordered for   COORDINATION OF CARE: At 1240 PM Discussed treatment plan with patient which includes UA, B12 injection and pap smear. Patient agrees.   I personally performed the services described in this documentation, which was scribed in my presence. The recorded information has been reviewed and is accurate.  Her exam is unremarkable. She does have microscopic hematuria which has not been evaluated. We'll refer to urology for this .

## 2013-11-03 LAB — PAP IG (IMAGE GUIDED)

## 2013-11-04 LAB — URINE CULTURE
Colony Count: NO GROWTH
Organism ID, Bacteria: NO GROWTH

## 2013-11-05 ENCOUNTER — Other Ambulatory Visit: Payer: Self-pay | Admitting: Emergency Medicine

## 2013-11-05 NOTE — Telephone Encounter (Signed)
PATIENT IS REQUESTING A REFILL ON OMEPRAZOLE 20 MG. HER PCP IS DR. DAUB. PLEASE CALL HER WHEN IT HAS BEEN DONE. BEST PHONE [phone] (HOME)  [place] [address] [place].  [place]

## 2013-11-07 ENCOUNTER — Ambulatory Visit
Admission: RE | Admit: 2013-11-07 | Discharge: 2013-11-07 | Disposition: A | Discharge status: Home / Self Care | Payer: Self-pay | Source: Ambulatory Visit

## 2013-11-07 DIAGNOSIS — Z1231 Encounter for screening mammogram for malignant neoplasm of breast: Secondary | ICD-10-CM

## 2013-11-07 NOTE — Telephone Encounter (Signed)
Dr Everlene Farrier, you have seen pt several times recently, but not re: omeprazole/GERD. Do you want to give RFs?

## 2013-11-11 ENCOUNTER — Ambulatory Visit (INDEPENDENT_AMBULATORY_CARE_PROVIDER_SITE_OTHER): Payer: BC Managed Care – PPO | Admitting: Internal Medicine

## 2013-11-11 VITALS — BP 110/70 | HR 63 | Temp 97.9°F | Resp 16 | Ht 61.0 in | Wt 208.0 lb

## 2013-11-11 DIAGNOSIS — R05 Cough: Secondary | ICD-10-CM

## 2013-11-11 DIAGNOSIS — J301 Allergic rhinitis due to pollen: Secondary | ICD-10-CM

## 2013-11-11 DIAGNOSIS — R059 Cough, unspecified: Secondary | ICD-10-CM

## 2013-11-11 DIAGNOSIS — J309 Allergic rhinitis, unspecified: Secondary | ICD-10-CM

## 2013-11-11 MED ORDER — CETIRIZINE HCL 10 MG PO TABS: 10.0000 mg | ORAL_TABLET | Freq: Every day | ORAL | Status: DC

## 2013-11-11 MED ORDER — HYDROCOD POLST-CHLORPHEN POLST 10-8 MG/5ML PO LQCR: 5.0000 mL | Freq: Two times a day (BID) | ORAL | Status: DC | PRN

## 2013-11-11 NOTE — Progress Notes (Signed)
   Subjective:    Patient ID: Morgan Espinoza, female    DOB: July 22, 1951, 63 y.o.   MRN: [id number]  HPI    Review of Systems     Objective:   Physical Exam        Assessment & Plan:

## 2013-11-11 NOTE — Progress Notes (Signed)
   Subjective:    Patient ID: Morgan Espinoza, female    DOB: 01/14/1951, 63 y.o.   MRN: [id number]  HPI Pt here for fever, chest congestion, that started last night. Also chills, stuffy nose, slight headache. Denies throat drainage or pain, no otalgia. otc tylenol for pain with some relief. Has hayfever, allergys. Discharge is clear. No sob, cp. Not using flonase or AH.  Review of Systems     Objective:   Physical Exam  Vitals reviewed. Constitutional: She is oriented to person, place, and time. She appears well-developed and well-nourished. No distress.  HENT:  Head: Normocephalic.  Right Ear: External ear normal.  Left Ear: External ear normal.  Nose: Mucosal edema, rhinorrhea and sinus tenderness present. Right sinus exhibits no maxillary sinus tenderness and no frontal sinus tenderness. Left sinus exhibits no maxillary sinus tenderness and no frontal sinus tenderness.  Mouth/Throat: Oropharynx is clear and moist.  Eyes: Conjunctivae and EOM are normal. Pupils are equal, round, and reactive to light.  Neck: Normal range of motion. Neck supple.  Cardiovascular: Normal rate, regular rhythm and normal heart sounds.   Pulmonary/Chest: Effort normal and breath sounds normal. No respiratory distress. She has no wheezes. She has no rales. She exhibits no tenderness.  Lymphadenopathy:    She has no cervical adenopathy.  Neurological: She is alert and oriented to person, place, and time. No cranial nerve deficit. She exhibits normal muscle tone. Coordination normal.  Psychiatric: She has a normal mood and affect.          Assessment & Plan:  Allergic Rhinitis/Hayfever/Cough

## 2013-11-30 ENCOUNTER — Ambulatory Visit (HOSPITAL_BASED_OUTPATIENT_CLINIC_OR_DEPARTMENT_OTHER): Payer: BC Managed Care – PPO | Admitting: Hematology & Oncology

## 2013-11-30 ENCOUNTER — Ambulatory Visit (HOSPITAL_BASED_OUTPATIENT_CLINIC_OR_DEPARTMENT_OTHER): Payer: BC Managed Care – PPO

## 2013-11-30 ENCOUNTER — Ambulatory Visit (HOSPITAL_BASED_OUTPATIENT_CLINIC_OR_DEPARTMENT_OTHER): Payer: BC Managed Care – PPO | Admitting: Lab

## 2013-11-30 ENCOUNTER — Encounter: Payer: Self-pay | Admitting: Hematology & Oncology

## 2013-11-30 VITALS — BP 137/72 | HR 57 | Temp 97.8°F | Resp 14 | Ht 62.0 in | Wt 206.0 lb

## 2013-11-30 DIAGNOSIS — C9000 Multiple myeloma not having achieved remission: Secondary | ICD-10-CM

## 2013-11-30 DIAGNOSIS — C888 Other malignant immunoproliferative diseases: Secondary | ICD-10-CM

## 2013-11-30 DIAGNOSIS — C9030 Solitary plasmacytoma not having achieved remission: Secondary | ICD-10-CM

## 2013-11-30 DIAGNOSIS — D509 Iron deficiency anemia, unspecified: Secondary | ICD-10-CM

## 2013-11-30 DIAGNOSIS — D649 Anemia, unspecified: Secondary | ICD-10-CM

## 2013-11-30 LAB — CBC WITH DIFFERENTIAL (CANCER CENTER ONLY)
BASO#: 0 10*3/uL (ref 0.0–0.2)
BASO%: 0.2 % (ref 0.0–2.0)
EOS ABS: 0.2 10*3/uL (ref 0.0–0.5)
EOS%: 4.5 % (ref 0.0–7.0)
HCT: 35.3 % (ref 34.8–46.6)
HGB: 11.7 g/dL (ref 11.6–15.9)
LYMPH#: 1.5 10*3/uL (ref 0.9–3.3)
LYMPH%: 32.7 % (ref 14.0–48.0)
MCH: 31.5 pg (ref 26.0–34.0)
MCHC: 33.1 g/dL (ref 32.0–36.0)
MCV: 95 fL (ref 81–101)
MONO#: 0.3 10*3/uL (ref 0.1–0.9)
MONO%: 7.6 % (ref 0.0–13.0)
NEUT#: 2.5 10*3/uL (ref 1.5–6.5)
NEUT%: 55 % (ref 39.6–80.0)
Platelets: 119 10*3/uL — ABNORMAL LOW (ref 145–400)
RBC: 3.71 10*6/uL (ref 3.70–5.32)
RDW: 12.4 % (ref 11.1–15.7)
WBC: 4.5 10*3/uL (ref 3.9–10.0)

## 2013-11-30 LAB — IRON AND TIBC CHCC
%SAT: 29 % (ref 21–57)
IRON: 74 ug/dL (ref 41–142)
TIBC: 255 ug/dL (ref 236–444)
UIBC: 181 ug/dL (ref 120–384)

## 2013-11-30 LAB — FERRITIN CHCC: Ferritin: 649 ng/ml — ABNORMAL HIGH (ref 9–269)

## 2013-11-30 MED ORDER — SODIUM CHLORIDE 0.9 % IV SOLN
INTRAVENOUS | Status: DC
  Administered 2013-11-30: 12:00:00 via INTRAVENOUS

## 2013-11-30 MED ORDER — ZOLEDRONIC ACID 4 MG/100ML IV SOLN
4.0000 mg | Freq: Once | INTRAVENOUS | Status: AC
  Administered 2013-11-30: 4 mg via INTRAVENOUS
  Filled 2013-11-30: qty 100

## 2013-11-30 NOTE — Patient Instructions (Signed)

## 2013-11-30 NOTE — Progress Notes (Signed)
  DIAGNOSIS:  IgG Kappa plasmacytoma  Iron deficiency anemia   CURRENT THERAPY:  Zometa 4 mg IV q. 2 months IV iron-last received Feraheme in January with a dose of 1020 mg  INTERIM HISTORY:  Morgan Espinoza comes in for followup. She continues to do well. She's had no specific complaints. She's had no weakness. She's had no cough or shortness of breath.    We last checked her monoclonal studies inFebrary. She did not have a monoclonal spike in her serum. We checked her iron studies. Her ferritin was 1086. Iron saturation was 40%. .    She's had no bleeding. There's been no change in bowel or bladder habits. She's had a good appetite. There's been no leg swelling. She has no rashes.   PHYSICAL EXAMINATION:  Well-developed well-nourished African American female in no obvious distress. Vital signs are temperature of 98.2 pulse 53 heart rate 75.  blood pressure 125/63 weight is 206 pounds    Head and neck exam shows no ocular or oral lesions. There is no scleral icterus. There is no adenopathy in the neck. Lungs are clear. Cardiac exam regular in rhythm with a normal S1-S2. There are no murmurs rubs or bruits. Abdomen is soft. She is good bowel sounds. There is no fluid wave. There is no palpable hepato- Soto megaly back exam no tenderness over the spine ribs or hips. Extremities shows no clubbing cyanosis or edema. Neurological exam shows no focal neurological deficits.   LABORATORY STUDIES:  White cell count 4.5. Hemoglobin 11.7. Platelet count was 119   IMPRESSION:  [name] is a 63 year old Afro-American female. She presented with an IgG kappa plasmocytoma in the left second rib. She had a full workup. No obvious myeloma was noted.    She received 5000 rads to the left rib that was completed in February of 2014.    I then we can move her Zometa appointments out to every 2 months.    I do not see need to do any bone marrow test or urine studies right  now.       [name], MD 11/30/2013

## 2013-11-30 NOTE — Addendum Note (Signed)
Addended by: Jimmy Footman on: 11/30/2013 11:44 AM   Modules accepted: Orders

## 2013-11-30 NOTE — Addendum Note (Signed)
Addended by: Lenn Sink on: 11/30/2013 12:15 PM   Modules accepted: Orders

## 2013-12-03 ENCOUNTER — Ambulatory Visit: Payer: BC Managed Care – PPO

## 2013-12-03 ENCOUNTER — Ambulatory Visit (INDEPENDENT_AMBULATORY_CARE_PROVIDER_SITE_OTHER): Payer: BC Managed Care – PPO | Admitting: Emergency Medicine

## 2013-12-03 VITALS — BP 134/70 | HR 84 | Temp 98.0°F | Resp 16 | Ht 62.0 in | Wt 207.6 lb

## 2013-12-03 DIAGNOSIS — M25569 Pain in unspecified knee: Secondary | ICD-10-CM

## 2013-12-03 DIAGNOSIS — E538 Deficiency of other specified B group vitamins: Secondary | ICD-10-CM

## 2013-12-03 MED ORDER — CYANOCOBALAMIN 1000 MCG/ML IJ SOLN
1000.0000 ug | Freq: Once | INTRAMUSCULAR | Status: AC
  Administered 2013-12-03: 1000 ug via INTRAMUSCULAR

## 2013-12-03 NOTE — Progress Notes (Signed)
   Subjective:    Patient ID: Morgan Espinoza, female    DOB: [birth year], 63 y.o.   MRN: [id number]  HPI  63 YO female patient comes in today following a fall this morning. She was walking and tripped. She is having pain in her left great toe and her left knee. She was feeding her dog and when she came in she tripped and fell striking her left knee and shin. She also feels like the nail on her left great toe may have been pulled backwards. she did not strike her head   Review of Systems     Objective:   Physical Exam Patient is alert and cooperative she is not in any distress. There is tenderness present over the medial portion of the tibia as well as tenderness down the midshaft of the tibia. There is good range of motion of the knee itself. There is tenderness over the nail plate of the left great toe. There is no tenderness over the IP joint of the toe or tenderness in the MTP joint of the great toe.  [facility] reading (PRIMARY) by  Dr. Everlene Farrier no fracture the knee is seen no fracture of the tibia or fibula.      Assessment & Plan:  Patient to apply ice to the area. She can take Tylenol for pain

## 2013-12-06 LAB — IFE INTERPRETATION

## 2013-12-06 LAB — PROTEIN ELECTROPHORESIS, SERUM, WITH REFLEX
ALPHA-2-GLOBULIN: 11 % (ref 7.1–11.8)
Albumin ELP: 55.3 % — ABNORMAL LOW (ref 55.8–66.1)
Alpha-1-Globulin: 5.3 % — ABNORMAL HIGH (ref 2.9–4.9)
BETA 2: 5.8 % (ref 3.2–6.5)
Beta Globulin: 5.2 % (ref 4.7–7.2)
GAMMA GLOBULIN: 17.4 % (ref 11.1–18.8)
Total Protein, Serum Electrophoresis: 6.5 g/dL (ref 6.0–8.3)

## 2013-12-06 LAB — COMPREHENSIVE METABOLIC PANEL
ALT: 28 U/L (ref 0–35)
AST: 29 U/L (ref 0–37)
Albumin: 3.8 g/dL (ref 3.5–5.2)
Alkaline Phosphatase: 61 U/L (ref 39–117)
BILIRUBIN TOTAL: 0.4 mg/dL (ref 0.2–1.2)
BUN: 21 mg/dL (ref 6–23)
CO2: 27 mEq/L (ref 19–32)
CREATININE: 0.7 mg/dL (ref 0.50–1.10)
Calcium: 8.6 mg/dL (ref 8.4–10.5)
Chloride: 106 mEq/L (ref 96–112)
GLUCOSE: 80 mg/dL (ref 70–99)
Potassium: 3.9 mEq/L (ref 3.5–5.3)
SODIUM: 142 meq/L (ref 135–145)
Total Protein: 6.5 g/dL (ref 6.0–8.3)

## 2013-12-06 LAB — IGG, IGA, IGM
IGA: 195 mg/dL (ref 69–380)
IGG (IMMUNOGLOBIN G), SERUM: 1440 mg/dL (ref 690–1700)
IGM, SERUM: 65 mg/dL (ref 52–322)

## 2013-12-06 LAB — KAPPA/LAMBDA LIGHT CHAINS
KAPPA LAMBDA RATIO: 0.92 (ref 0.26–1.65)
Kappa free light chain: 2.06 mg/dL — ABNORMAL HIGH (ref 0.33–1.94)
LAMBDA FREE LGHT CHN: 2.23 mg/dL (ref 0.57–2.63)

## 2013-12-10 ENCOUNTER — Ambulatory Visit (INDEPENDENT_AMBULATORY_CARE_PROVIDER_SITE_OTHER): Payer: BC Managed Care – PPO | Admitting: Emergency Medicine

## 2013-12-10 ENCOUNTER — Ambulatory Visit: Payer: BC Managed Care – PPO

## 2013-12-10 VITALS — BP 132/60 | HR 66 | Temp 98.0°F | Resp 16 | Ht 62.0 in | Wt 207.0 lb

## 2013-12-10 DIAGNOSIS — J309 Allergic rhinitis, unspecified: Secondary | ICD-10-CM

## 2013-12-10 DIAGNOSIS — M79609 Pain in unspecified limb: Secondary | ICD-10-CM

## 2013-12-10 DIAGNOSIS — J301 Allergic rhinitis due to pollen: Secondary | ICD-10-CM

## 2013-12-10 DIAGNOSIS — M79671 Pain in right foot: Secondary | ICD-10-CM

## 2013-12-10 DIAGNOSIS — R05 Cough: Secondary | ICD-10-CM

## 2013-12-10 DIAGNOSIS — R059 Cough, unspecified: Secondary | ICD-10-CM

## 2013-12-10 MED ORDER — AZELASTINE HCL 0.1 % NA SOLN: 1.0000 | Freq: Two times a day (BID) | NASAL | Status: DC

## 2013-12-10 MED ORDER — ALBUTEROL SULFATE HFA 108 (90 BASE) MCG/ACT IN AERS: 2.0000 | INHALATION_SPRAY | Freq: Four times a day (QID) | RESPIRATORY_TRACT | Status: DC | PRN

## 2013-12-10 MED ORDER — CETIRIZINE HCL 10 MG PO TABS: 10.0000 mg | ORAL_TABLET | Freq: Every day | ORAL | Status: DC

## 2013-12-10 NOTE — Patient Instructions (Signed)
Allergic Rhinitis Allergic rhinitis is when the mucous membranes in the nose respond to allergens. Allergens are particles in the air that cause your body to have an allergic reaction. This causes you to release allergic antibodies. Through a chain of events, these eventually cause you to release histamine into the blood stream. Although meant to protect the body, it is this release of histamine that causes your discomfort, such as frequent sneezing, congestion, and an itchy, runny nose.  CAUSES  Seasonal allergic rhinitis (hay fever) is caused by pollen allergens that may come from grasses, trees, and weeds. Year-round allergic rhinitis (perennial allergic rhinitis) is caused by allergens such as house dust mites, pet dander, and mold spores.  SYMPTOMS   Nasal stuffiness (congestion).  Itchy, runny nose with sneezing and tearing of the eyes. DIAGNOSIS  Your health care provider can help you determine the allergen or allergens that trigger your symptoms. If you and your health care provider are unable to determine the allergen, skin or blood testing may be used. TREATMENT  Allergic Rhinitis does not have a cure, but it can be controlled by:  Medicines and allergy shots (immunotherapy).  Avoiding the allergen. Hay fever may often be treated with antihistamines in pill or nasal spray forms. Antihistamines block the effects of histamine. There are over-the-counter medicines that may help with nasal congestion and swelling around the eyes. Check with your health care provider before taking or giving this medicine.  If avoiding the allergen or the medicine prescribed do not work, there are many new medicines your health care provider can prescribe. Stronger medicine may be used if initial measures are ineffective. Desensitizing injections can be used if medicine and avoidance does not work. Desensitization is when a patient is given ongoing shots until the body becomes less sensitive to the allergen.  Make sure you follow up with your health care provider if problems continue. HOME CARE INSTRUCTIONS It is not possible to completely avoid allergens, but you can reduce your symptoms by taking steps to limit your exposure to them. It helps to know exactly what you are allergic to so that you can avoid your specific triggers. SEEK MEDICAL CARE IF:   You have a fever.  You develop a cough that does not stop easily (persistent).  You have shortness of breath.  You start wheezing.  Symptoms interfere with normal daily activities. Document Released: 04/22/2001 Document Revised: 05/18/2013 Document Reviewed: 04/04/2013 Exit[employer] Patient Information 2014 [employer].  

## 2013-12-10 NOTE — Progress Notes (Signed)
   Subjective:  This chart was scribed for Darlyne Russian, MD by Mercy Moore, Medial Scribe. This patient was seen in room 11 and the patient's care was started at 10:26 AM.    Patient ID: Morgan Espinoza, female    DOB: [birth year], 63 y.o.   MRN: [id number]  HPI HPI Comments: Foot Pain: Morgan Espinoza is a 63 y.o. female who presents to the [facility] complaining of right foot pain, onset last night. Patient locates pain at the top and bottom surfaces. Patient reports a recent fall last week "on the left side". At evaluation she did not receive an imaging of her right foot.   Allergies: Patient reports seasonal allergy symptoms, but denies any wheezing. States she has been treating symptoms with Tylenol. Patient states that she an outdated inhaler which she has not needed to use in a long time.  Shoulder Pain: Patient briefly mentions that her shoulder is "acting up."  Review of Systems  Respiratory: Negative for wheezing.   Musculoskeletal: Positive for arthralgias.       Objective:   Physical Exam  Nursing note and vitals reviewed.   CONSTITUTIONAL: Well developed/well nourished HEAD: Normocephalic/atraumatic EYES: EOMI/PERRL ENMT: Mucous membranes moist there is significant nasal congestion. NECK: supple no meningeal signs SPINE:entire spine nontender CV: S1/S2 noted, no murmurs/rubs/gallops noted LUNGS: Lungs are clear to auscultation bilaterally, no apparent distress. There was mild prolongation of expiratory phase but good air exchange ABDOMEN: soft, nontender, no rebound or guarding GU:no cva tenderness NEURO: Pt is awake/alert, moves all extremitiesx4 EXTREMITIES: pulses normal, full ROM. There is significant tenderness over the dorsum of the right foot but no swelling  SKIN: warm, color normal PSYCH: no abnormalities of mood noted  [facility] reading (PRIMARY) by  Dr.Mitsy Owen  no fracture seen    Assessment & Plan:  We'll treat with albuterol inhaler,  Zyrtec, asked appropriate she was given an Ace wrap for her foot sprain.

## 2013-12-16 ENCOUNTER — Other Ambulatory Visit: Payer: Self-pay | Admitting: Physician Assistant

## 2013-12-19 NOTE — Telephone Encounter (Signed)
Will RF x 1 w/note that pt needs f/up for med refills

## 2014-01-09 ENCOUNTER — Other Ambulatory Visit: Payer: Self-pay | Admitting: Emergency Medicine

## 2014-01-09 NOTE — Telephone Encounter (Signed)
Dr Everlene Farrier, you have seen pt several times recently, but not for this med. Do you want to RF or pt to RTC?

## 2014-01-30 ENCOUNTER — Encounter (INDEPENDENT_AMBULATORY_CARE_PROVIDER_SITE_OTHER): Payer: Self-pay

## 2014-01-30 ENCOUNTER — Encounter: Payer: Self-pay | Admitting: Nurse Practitioner

## 2014-01-30 ENCOUNTER — Ambulatory Visit (INDEPENDENT_AMBULATORY_CARE_PROVIDER_SITE_OTHER): Payer: BC Managed Care – PPO | Admitting: Nurse Practitioner

## 2014-01-30 VITALS — BP 125/75 | HR 65 | Ht 61.0 in | Wt 203.0 lb

## 2014-01-30 DIAGNOSIS — G35 Multiple sclerosis: Secondary | ICD-10-CM

## 2014-01-30 DIAGNOSIS — Z79899 Other long term (current) drug therapy: Secondary | ICD-10-CM

## 2014-01-30 NOTE — Patient Instructions (Signed)
Continue Betarseron at current dose Labs reviewed F/U yearly

## 2014-01-30 NOTE — Progress Notes (Signed)
[facility]  PATIENT: Morgan Espinoza DOB: [birth year]   REASON FOR VISIT: Followup for multiple sclerosis   HISTORY OF PRESENT ILLNESS: Morgan Espinoza, 63 year old female returns for followup.She has a history of multiple sclerosis. She has had no relapses since 2002, currently on Betaseron every other day tolerating the medication without any injection site problems. She pretreats site with ice . She is  Retired. She denies any double vision, loss of vision, sensory changes, loss of bowel or bladder control. She has intermittent urinary frequency and has a history of asthma. She has had no falls, no dizziness, no balance issues. No new neurologic complaints.     REVIEW OF SYSTEMS: Full 14 system review of systems performed and notable only for those listed, all others are neg:  Constitutional: N/A  Cardiovascular: N/A  Ear/Nose/Throat: N/A  Skin: N/A  Eyes: N/A  Respiratory: N/A  Gastroitestinal: N/A  Hematology/Lymphatic: N/A  Endocrine: N/A Musculoskeletal:N/A  Allergy/Immunology: N/A  Neurological: N/A Psychiatric: N/A Sleep : NA   ALLERGIES: Allergies  Allergen Reactions  . Celebrex [Celecoxib]   . Dristan Other (See Comments)    "made my chest tight"  . Robitussin (Alcohol Free) [Guaifenesin] Other (See Comments)    "made my chest tight"  . Ropinirole Hcl Other (See Comments)    "made my chest tight"  . Tramadol Other (See Comments)    "made my chest tight"    HOME MEDICATIONS: Outpatient Prescriptions Prior to Visit  Medication Sig Dispense Refill  . albuterol (PROVENTIL HFA;VENTOLIN HFA) 108 (90 BASE) MCG/ACT inhaler Inhale 2 puffs into the lungs every 6 (six) hours as needed for wheezing or shortness of breath. As needed only  1 Inhaler  1  . amLODipine (NORVASC) 10 MG tablet take 1 tablet by mouth once daily for hypertension  30 tablet  5  . aspirin 81 MG tablet Take 81 mg by mouth daily.      Marland Kitchen azelastine (ASTELIN) 0.1 % nasal spray Place 1  spray into both nostrils 2 (two) times daily. Use in each nostril as directed  30 mL  12  . cetirizine (ZYRTEC) 10 MG tablet Take 1 tablet (10 mg total) by mouth daily.  30 tablet  11  . chlorpheniramine-HYDROcodone (TUSSIONEX PENNKINETIC ER) 10-8 MG/5ML LQCR Take 5 mLs by mouth every 12 (twelve) hours as needed for cough.  140 mL  0  . Cyanocobalamin 1000 MCG/ML KIT Inject as directed every 30 (thirty) days.      . diclofenac sodium (VOLTAREN) 1 % GEL Applied 2 g to the left knee 3 times a day  100 g  2  . docusate sodium (COLACE) 100 MG capsule Take 100 mg by mouth as needed for constipation.      . ferrous sulfate 325 (65 FE) MG tablet take 1 tablet by mouth once daily WITH BREAKFAST  30 tablet  11  . fluticasone (FLOVENT DISKUS) 50 MCG/BLIST diskus inhaler Inhale 1 puff into the lungs 2 (two) times daily.      Marland Kitchen HYDROcodone-acetaminophen (NORCO/VICODIN) 5-325 MG per tablet Take 1 tablet by mouth every 6 (six) hours as needed for pain.  30 tablet  1  . Interferon Beta-1b (BETASERON) 0.3 MG KIT injection Inject 0.25 mg into the skin every other day.  1 kit  11  . losartan (COZAAR) 50 MG tablet Take one tablet daily  30 tablet  11  . metoprolol succinate (TOPROL-XL) 25 MG 24 hr tablet Take 25 mg by mouth daily.       Marland Kitchen  omeprazole (PRILOSEC) 20 MG capsule Take 1 capsule (20 mg total) by mouth daily.  30 capsule  5  . ranitidine (ZANTAC) 150 MG tablet Take one tablet twice a day  60 tablet  11  . sucralfate (CARAFATE) 1 GM/10ML suspension Take 2 teaspoonfuls by mouth every 6 hrs.  420 mL  4  . sucralfate (CARAFATE) 1 GM/10ML suspension take 2 teaspoonfuls as needed       Facility-Administered Medications Prior to Visit  Medication Dose Route Frequency Provider Last Rate Last Dose  . 0.9 %  sodium chloride infusion   Intravenous Continuous [name], MD        PAST MEDICAL HISTORY: Past Medical History  Diagnosis Date  . Hypertension   . GERD (gastroesophageal reflux disease)   .  Asthma   . Multiple sclerosis   . Hyperlipidemia   . Vertigo   . Vitamin D deficiency   . Obesity   . Achilles tendonitis   . OA (osteoarthritis) of knee   . Anemia, iron deficiency 07/23/2012  . 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 Cancer 05/2012    plasma cell myeloma  . S/P radiation therapy 08/12/12 - 09/21/12    left Anterior 2nd Rib Lesion / 50.4 GY / 28 Fractions    PAST SURGICAL HISTORY: Past Surgical History  Procedure Laterality Date  . Cholecystectomy      lap choley  . Abdominal hysterectomy      fibroids  . Breast surgery      left breast biopsy-benign  . Rotator cuff repair    . Bone biopsy  05/17/12    Left 2nd Rib, Plasma Cell Myeloma    FAMILY HISTORY: Family History  Problem Relation Age of Onset  . Dementia Mother   . Hypertension Mother   . Cancer Father     lung; +tobacco  . Diabetes Sister   . Cancer Brother   . Diabetes Sister     SOCIAL HISTORY: History   Social History  . Marital Status: Widowed    Spouse Name: N/A    Number of Children: 1  . Years of Education: 12   Occupational History  .    Marland Kitchen Retired     Social History Main Topics  . Smoking status: Former Smoker -- 0.10 packs/day for 2 years    Types: Cigarettes    Start date: 10/05/1976    Quit date: 04/19/1979  . Smokeless tobacco: Never Used     Comment: quit 35 years ago   . Alcohol Use: No  . Drug Use: No  . Sexual Activity: No   Other Topics Concern  . 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    Patient is retired.    Patient is widowed.    Patient is right handed.    Patient has a high school education.    Patient has 1 child.      PHYSICAL EXAM  Filed Vitals:   01/30/14 1052  BP: 125/75  Pulse: 65  Height: '5\' 1"'  (1.549 m)  Weight: 203 lb (92.08 kg)   Body mass index is 38.38 kg/(m^2). General: well developed, obese female seated, in no evident distress  Head: head  normocephalic and atraumatic. Oropharynx benign  Neck: supple with no carotid bruits  Cardiovascular: regular rate and rhythm, no murmurs  Neurologic Exam  Mental Status: Awake and fully alert.  Oriented to place and time. Follows all commands. Speech and language normal.  Cranial Nerves: Marland Kitchen Visual acuity 20/40 right, 20/30 the Pupils equal, briskly reactive to light. Extraocular movements full without nystagmus. Visual fields full to confrontation. Hearing intact and symmetric to finger snap. Face, tongue, palate move normally and symmetrically. Neck flexion and extension normal.  Motor: Normal bulk and tone. Normal strength in all tested extremity muscles.No focal weakness  Sensory.: intact to touch and pinprick and vibratory.  Coordination: Rapid alternating movements normal in all extremities. Finger-to-nose and heel-to-shin performed accurately bilaterally. No dysmetria  Gait and Station: Arises from chair without difficulty. Stance is wide based. Gait demonstrates normal stride length and balance . Able to heel, toe and tandem walk without difficulty.  Reflexes: 1+ and symmetric. Toes downgoing.   DIAGNOSTIC DATA (LABS, IMAGING, TESTING) - I reviewed patient records, labs, notes, testing and imaging myself where available.  Lab Results  Component Value Date   WBC 4.5 11/30/2013   HGB 11.7 11/30/2013   HCT 35.3 11/30/2013   MCV 95 11/30/2013   PLT 119* 11/30/2013      Component Value Date/Time   NA 142 11/30/2013 0908   NA 140 10/05/2013 0946   NA 140 01/28/2013 1421   K 3.9 11/30/2013 0908   K 4.0 10/05/2013 0946   CL 106 11/30/2013 0908   CL 106 10/05/2013 0946   CO2 27 11/30/2013 0908   CO2 30 10/05/2013 0946   GLUCOSE 80 11/30/2013 0908   GLUCOSE 83 10/05/2013 0946   GLUCOSE 92 01/28/2013 1421   BUN 21 11/30/2013 0908   BUN 15 10/05/2013 0946   BUN 20 01/28/2013 1421   CREATININE 0.70 11/30/2013 0908   CREATININE 0.7 10/05/2013 0946   CALCIUM 8.6 11/30/2013 0908   CALCIUM 8.6 10/05/2013  0946   PROT 6.5 11/30/2013 0908   PROT 7.0 10/05/2013 0946   PROT 6.9 01/28/2013 1421   ALBUMIN 3.8 11/30/2013 0908   AST 29 11/30/2013 0908   AST 37 10/05/2013 0946   ALT 28 11/30/2013 0908   ALT 33 10/05/2013 0946   ALKPHOS 61 11/30/2013 0908   ALKPHOS 61 10/05/2013 0946   BILITOT 0.4 11/30/2013 0908   BILITOT 0.70 10/05/2013 0946   GFRNONAA >90 02/23/2013 1237   GFRAA >90 02/23/2013 1237    Lab Results  Component Value Date   VITAMINB12 464 02/17/2013   Lab Results  Component Value Date   TSH 0.771 02/17/2013      ASSESSMENT AND PLAN  63 y.o. year old female  has a past medical history of Hypertension; Multiple sclerosis; Hyperlipidemia;  Vitamin D deficiency; Obesity;  Anemia, iron deficiency (07/23/2012); Plasma cell myeloma (05/17/12); Cancer (05/2012); and S/P radiation therapy (08/12/12 - 09/21/12). here to followup for her MS.  Continue Betarseron at current dose Labs reviewed from April 2015 F/U yearly Dennie Bible, [facility], [facility], [facility] [address], [place], [place] [address] [phone]

## 2014-02-01 ENCOUNTER — Ambulatory Visit (HOSPITAL_BASED_OUTPATIENT_CLINIC_OR_DEPARTMENT_OTHER): Payer: BC Managed Care – PPO | Admitting: Hematology & Oncology

## 2014-02-01 ENCOUNTER — Ambulatory Visit (HOSPITAL_BASED_OUTPATIENT_CLINIC_OR_DEPARTMENT_OTHER): Payer: BC Managed Care – PPO | Admitting: Lab

## 2014-02-01 ENCOUNTER — Ambulatory Visit (HOSPITAL_BASED_OUTPATIENT_CLINIC_OR_DEPARTMENT_OTHER): Payer: BC Managed Care – PPO

## 2014-02-01 VITALS — BP 131/69 | HR 59 | Temp 97.9°F | Resp 20 | Ht 61.0 in | Wt 202.0 lb

## 2014-02-01 VITALS — BP 128/73 | HR 62 | Temp 96.4°F | Resp 20

## 2014-02-01 DIAGNOSIS — C9030 Solitary plasmacytoma not having achieved remission: Secondary | ICD-10-CM

## 2014-02-01 DIAGNOSIS — C9000 Multiple myeloma not having achieved remission: Secondary | ICD-10-CM

## 2014-02-01 DIAGNOSIS — D509 Iron deficiency anemia, unspecified: Secondary | ICD-10-CM

## 2014-02-01 LAB — CBC WITH DIFFERENTIAL (CANCER CENTER ONLY)
BASO#: 0 10*3/uL (ref 0.0–0.2)
BASO%: 0.2 % (ref 0.0–2.0)
EOS%: 7.2 % — AB (ref 0.0–7.0)
Eosinophils Absolute: 0.4 10*3/uL (ref 0.0–0.5)
HCT: 32.5 % — ABNORMAL LOW (ref 34.8–46.6)
HGB: 10.7 g/dL — ABNORMAL LOW (ref 11.6–15.9)
LYMPH#: 1.7 10*3/uL (ref 0.9–3.3)
LYMPH%: 33.9 % (ref 14.0–48.0)
MCH: 31.8 pg (ref 26.0–34.0)
MCHC: 32.9 g/dL (ref 32.0–36.0)
MCV: 96 fL (ref 81–101)
MONO#: 0.5 10*3/uL (ref 0.1–0.9)
MONO%: 9.7 % (ref 0.0–13.0)
NEUT%: 49 % (ref 39.6–80.0)
NEUTROS ABS: 2.4 10*3/uL (ref 1.5–6.5)
Platelets: 146 10*3/uL (ref 145–400)
RBC: 3.37 10*6/uL — ABNORMAL LOW (ref 3.70–5.32)
RDW: 11.9 % (ref 11.1–15.7)
WBC: 4.9 10*3/uL (ref 3.9–10.0)

## 2014-02-01 LAB — CMP (CANCER CENTER ONLY)
ALT(SGPT): 30 U/L (ref 10–47)
AST: 37 U/L (ref 11–38)
Albumin: 3.3 g/dL (ref 3.3–5.5)
Alkaline Phosphatase: 57 U/L (ref 26–84)
BUN, Bld: 16 mg/dL (ref 7–22)
CALCIUM: 8.5 mg/dL (ref 8.0–10.3)
CO2: 29 meq/L (ref 18–33)
Chloride: 106 mEq/L (ref 98–108)
Creat: 0.5 mg/dl — ABNORMAL LOW (ref 0.6–1.2)
GLUCOSE: 96 mg/dL (ref 73–118)
Potassium: 3.8 mEq/L (ref 3.3–4.7)
SODIUM: 143 meq/L (ref 128–145)
TOTAL PROTEIN: 6.8 g/dL (ref 6.4–8.1)
Total Bilirubin: 0.6 mg/dl (ref 0.20–1.60)

## 2014-02-01 MED ORDER — FERUMOXYTOL INJECTION 510 MG/17 ML
1020.0000 mg | Freq: Once | INTRAVENOUS | Status: AC
  Administered 2014-02-01: 1020 mg via INTRAVENOUS
  Filled 2014-02-01: qty 34

## 2014-02-01 MED ORDER — ZOLEDRONIC ACID 4 MG/100ML IV SOLN
4.0000 mg | Freq: Once | INTRAVENOUS | Status: AC
  Administered 2014-02-01: 4 mg via INTRAVENOUS
  Filled 2014-02-01: qty 100

## 2014-02-01 NOTE — Patient Instructions (Signed)
Zoledronic Acid injection (Hypercalcemia, Oncology) What is this medicine? ZOLEDRONIC ACID (ZOE le dron ik AS id) lowers the amount of calcium loss from bone. It is used to treat too much calcium in your blood from cancer. It is also used to prevent complications of cancer that has spread to the bone. This medicine may be used for other purposes; ask your health care provider or pharmacist if you have questions. COMMON BRAND NAME(S): Zometa What should I tell my health care provider before I take this medicine? They need to know if you have any of these conditions: -aspirin-sensitive asthma -cancer, especially if you are receiving medicines used to treat cancer -dental disease or wear dentures -infection -kidney disease -receiving corticosteroids like dexamethasone or prednisone -an unusual or allergic reaction to zoledronic acid, other medicines, foods, dyes, or preservatives -pregnant or trying to get pregnant -breast-feeding How should I use this medicine? This medicine is for infusion into a vein. It is given by a health care professional in a hospital or clinic setting. Talk to your pediatrician regarding the use of this medicine in children. Special care may be needed. Overdosage: If you think you have taken too much of this medicine contact a poison control center or emergency room at once. NOTE: This medicine is only for you. Do not share this medicine with others. What if I miss a dose? It is important not to miss your dose. Call your doctor or health care professional if you are unable to keep an appointment. What may interact with this medicine? -certain antibiotics given by injection -NSAIDs, medicines for pain and inflammation, like ibuprofen or naproxen -some diuretics like bumetanide, furosemide -teriparatide -thalidomide This list may not describe all possible interactions. Give your health care provider a list of all the medicines, herbs, non-prescription drugs, or  dietary supplements you use. Also tell them if you smoke, drink alcohol, or use illegal drugs. Some items may interact with your medicine. What should I watch for while using this medicine? Visit your doctor or health care professional for regular checkups. It may be some time before you see the benefit from this medicine. Do not stop taking your medicine unless your doctor tells you to. Your doctor may order blood tests or other tests to see how you are doing. Women should inform their doctor if they wish to become pregnant or think they might be pregnant. There is a potential for serious side effects to an unborn child. Talk to your health care professional or pharmacist for more information. You should make sure that you get enough calcium and vitamin D while you are taking this medicine. Discuss the foods you eat and the vitamins you take with your health care professional. Some people who take this medicine have severe bone, joint, and/or muscle pain. This medicine may also increase your risk for jaw problems or a broken thigh bone. Tell your doctor right away if you have severe pain in your jaw, bones, joints, or muscles. Tell your doctor if you have any pain that does not go away or that gets worse. Tell your dentist and dental surgeon that you are taking this medicine. You should not have major dental surgery while on this medicine. See your dentist to have a dental exam and fix any dental problems before starting this medicine. Take good care of your teeth while on this medicine. Make sure you see your dentist for regular follow-up appointments. What side effects may I notice from receiving this medicine? Side effects that   you should report to your doctor or health care professional as soon as possible: -allergic reactions like skin rash, itching or hives, swelling of the face, lips, or tongue -anxiety, confusion, or depression -breathing problems -changes in vision -eye pain -feeling faint or  lightheaded, falls -jaw pain, especially after dental work -mouth sores -muscle cramps, stiffness, or weakness -trouble passing urine or change in the amount of urine Side effects that usually do not require medical attention (report to your doctor or health care professional if they continue or are bothersome): -bone, joint, or muscle pain -constipation -diarrhea -fever -hair loss -irritation at site where injected -loss of appetite -nausea, vomiting -stomach upset -trouble sleeping -trouble swallowing -weak or tired This list may not describe all possible side effects. Call your doctor for medical advice about side effects. You may report side effects to FDA at 1-800-FDA-1088. Where should I keep my medicine? This drug is given in a hospital or clinic and will not be stored at home. NOTE: This sheet is a summary. It may not cover all possible information. If you have questions about this medicine, talk to your doctor, pharmacist, or health care provider.  2015, [employer]vier/Gold Standard. (2013-01-06 13:03:13) Ferumoxytol injection What is this medicine? FERUMOXYTOL is an iron complex. Iron is used to make healthy red blood cells, which carry oxygen and nutrients throughout the body. This medicine is used to treat iron deficiency anemia in people with chronic kidney disease. This medicine may be used for other purposes; ask your health care provider or pharmacist if you have questions. COMMON BRAND NAME(S): Feraheme What should I tell my health care provider before I take this medicine? They need to know if you have any of these conditions: -anemia not caused by low iron levels -high levels of iron in the blood -magnetic resonance imaging (MRI) test scheduled -an unusual or allergic reaction to iron, other medicines, foods, dyes, or preservatives -pregnant or trying to get pregnant -breast-feeding How should I use this medicine? This medicine is for injection into a vein. It is  given by a health care professional in a hospital or clinic setting. Talk to your pediatrician regarding the use of this medicine in children. Special care may be needed. Overdosage: If you think you've taken too much of this medicine contact a poison control center or emergency room at once. Overdosage: If you think you have taken too much of this medicine contact a poison control center or emergency room at once. NOTE: This medicine is only for you. Do not share this medicine with others. What if I miss a dose? It is important not to miss your dose. Call your doctor or health care professional if you are unable to keep an appointment. What may interact with this medicine? This medicine may interact with the following medications: -other iron products This list may not describe all possible interactions. Give your health care provider a list of all the medicines, herbs, non-prescription drugs, or dietary supplements you use. Also tell them if you smoke, drink alcohol, or use illegal drugs. Some items may interact with your medicine. What should I watch for while using this medicine? Visit your doctor or healthcare professional regularly. Tell your doctor or healthcare professional if your symptoms do not start to get better or if they get worse. You may need blood work done while you are taking this medicine. You may need to follow a special diet. Talk to your doctor. Foods that contain iron include: whole grains/cereals, dried fruits, beans,  or peas, leafy green vegetables, and organ meats (liver, kidney). What side effects may I notice from receiving this medicine? Side effects that you should report to your doctor or health care professional as soon as possible: -allergic reactions like skin rash, itching or hives, swelling of the face, lips, or tongue -breathing problems -changes in blood pressure -feeling faint or lightheaded, falls -fever or chills -flushing, sweating, or hot  feelings -swelling of the ankles or feet Side effects that usually do not require medical attention (Report these to your doctor or health care professional if they continue or are bothersome.): -diarrhea -headache -nausea, vomiting -stomach pain This list may not describe all possible side effects. Call your doctor for medical advice about side effects. You may report side effects to FDA at 1-800-FDA-1088. Where should I keep my medicine? This drug is given in a hospital or clinic and will not be stored at home. NOTE: This sheet is a summary. It may not cover all possible information. If you have questions about this medicine, talk to your doctor, pharmacist, or health care provider.  2015, Elsevier/Gold Standard. (2012-03-12 15:23:36)

## 2014-02-01 NOTE — Progress Notes (Signed)
Hematology and Oncology Follow Up Visit  Morgan Espinoza [id number] 04-Jun-1951 63 y.o. 02/01/2014   Principle Diagnosis:   IgG Kappa plasmacytoma  Iron deficiency anemia  Current Therapy:    Zometa 4 mg IV q. 2 months IV iron-last received Feraheme in January with a dose of 1020 mg     Interim History:  Ms.  Morgan Espinoza is back for followup. We see her every 2 months. She's doing quite well. She has no pain. She's had no cough. There's been no nausea vomiting. She's had no change in bowel or bladder habits. We last saw her, her ferritin was 649. Her iron saturation rate was only 29%.  Her last monoclonal studies did not show a monoclonal spike. Her IgG level was 1440 mg/dL. Her Kappa light chain was 2.06 mg/dL.  She's had no fever. She's had no leg swelling. There's been no rashes.   Medications: Current outpatient prescriptions:albuterol (PROVENTIL HFA;VENTOLIN HFA) 108 (90 BASE) MCG/ACT inhaler, Inhale 2 puffs into the lungs every 6 (six) hours as needed for wheezing or shortness of breath. As needed only, Disp: 1 Inhaler, Rfl: 1;  amLODipine (NORVASC) 10 MG tablet, take 1 tablet by mouth once daily for hypertension, Disp: 30 tablet, Rfl: 5;  aspirin 81 MG tablet, Take 81 mg by mouth daily., Disp: , Rfl:  azelastine (ASTELIN) 0.1 % nasal spray, Place 1 spray into both nostrils 2 (two) times daily. Use in each nostril as directed, Disp: 30 mL, Rfl: 12;  Cyanocobalamin 1000 MCG/ML KIT, Inject as directed every 30 (thirty) days., Disp: , Rfl: ;  diclofenac sodium (VOLTAREN) 1 % GEL, Applied 2 g to the left knee 3 times a day, Disp: 100 g, Rfl: 2;  docusate sodium (COLACE) 100 MG capsule, Take 100 mg by mouth as needed for constipation., Disp: , Rfl:  ferrous sulfate 325 (65 FE) MG tablet, take 1 tablet by mouth once daily WITH BREAKFAST, Disp: 30 tablet, Rfl: 11;  fluticasone (FLOVENT DISKUS) 50 MCG/BLIST diskus inhaler, Inhale 1 puff into the lungs 2 (two) times daily., Disp: , Rfl: ;  Interferon  Beta-1b (BETASERON) 0.3 MG KIT injection, Inject 0.25 mg into the skin every other day., Disp: 1 kit, Rfl: 11;  losartan (COZAAR) 50 MG tablet, Take one tablet daily, Disp: 30 tablet, Rfl: 11 metoprolol succinate (TOPROL-XL) 25 MG 24 hr tablet, Take 25 mg by mouth daily. , Disp: , Rfl: ;  omeprazole (PRILOSEC) 20 MG capsule, Take 1 capsule (20 mg total) by mouth daily., Disp: 30 capsule, Rfl: 5;  ranitidine (ZANTAC) 150 MG tablet, Take one tablet twice a day, Disp: 60 tablet, Rfl: 11;  sucralfate (CARAFATE) 1 GM/10ML suspension, Take 2 teaspoonfuls by mouth every 6 hrs., Disp: 420 mL, Rfl: 4 cetirizine (ZYRTEC) 10 MG tablet, Take 1 tablet (10 mg total) by mouth daily., Disp: 30 tablet, Rfl: 11;  chlorpheniramine-HYDROcodone (TUSSIONEX PENNKINETIC ER) 10-8 MG/5ML LQCR, Take 5 mLs by mouth every 12 (twelve) hours as needed for cough., Disp: 140 mL, Rfl: 0;  HYDROcodone-acetaminophen (NORCO/VICODIN) 5-325 MG per tablet, Take 1 tablet by mouth every 6 (six) hours as needed for pain., Disp: 30 tablet, Rfl: 1 No current facility-administered medications for this visit. Facility-Administered Medications Ordered in Other Visits: 0.9 %  sodium chloride infusion, , Intravenous, Continuous, [name], MD  Allergies:  Allergies  Allergen Reactions  . Celebrex [Celecoxib]   . Dristan Other (See Comments)    "made my chest tight"  . Robitussin (Alcohol Free) [Guaifenesin] Other (See Comments)    "  made my chest tight"  . Ropinirole Hcl Other (See Comments)    "made my chest tight"  . Tramadol Other (See Comments)    "made my chest tight"    Past Medical History, Surgical history, Social history, and Family History were reviewed and updated.  Review of Systems: As above  Physical Exam:  height is '5\' 1"'  (1.549 m) and weight is 202 lb (91.627 kg). Her oral temperature is 97.9 F (36.6 C). Her blood pressure is 131/69 and her pulse is 59. Her respiration is 20.   Well developed well nourished after  mecca female. Her head and neck exam shows no ocular or oral lesions. She is no palpable cervical or supraclavicular lymph nodes. Lungs are clear. Cardiac exam were in rhythm with no murmurs rubs or bruits. Abdomen is soft. She is good bowel sounds. There is no fluid wave. There is no palpable liver or spleen tip. Back exam no tenderness over the spine ribs or hips. Extremities shows no clubbing cyanosis or edema. Skin exam no rashes. Neurological exam is nonfocal.  Lab Results  Component Value Date   WBC 4.9 02/01/2014   HGB 10.7* 02/01/2014   HCT 32.5* 02/01/2014   MCV 96 02/01/2014   PLT 146 02/01/2014     Chemistry      Component Value Date/Time   NA 143 02/01/2014 1016   NA 142 11/30/2013 0908   NA 140 01/28/2013 1421   K 3.8 02/01/2014 1016   K 3.9 11/30/2013 0908   CL 106 02/01/2014 1016   CL 106 11/30/2013 0908   CO2 29 02/01/2014 1016   CO2 27 11/30/2013 0908   BUN 16 02/01/2014 1016   BUN 21 11/30/2013 0908   BUN 20 01/28/2013 1421   CREATININE 0.5* 02/01/2014 1016   CREATININE 0.70 11/30/2013 0908      Component Value Date/Time   CALCIUM 8.5 02/01/2014 1016   CALCIUM 8.6 11/30/2013 0908   ALKPHOS 57 02/01/2014 1016   ALKPHOS 61 11/30/2013 0908   AST 37 02/01/2014 1016   AST 29 11/30/2013 0908   ALT 30 02/01/2014 1016   ALT 28 11/30/2013 0908   BILITOT 0.60 02/01/2014 1016   BILITOT 0.4 11/30/2013 0908         Impression and Plan: Ms. Morgan Espinoza is 63 year old female with a plasmacytoma. She had radiation for this. This is in the left ribs. This was back in February of 0.14.  So far, we have not done any evidence of recurrence.  I think that we can move her Zometa infusions to every 3 months now.  Her hemoglobin is on the low side. She's a little bit more anemic. We will go ahead and give her a dose of IV iron.  I will plan to see her back in 3 months.   [name], MD 6/24/201511:20 AM

## 2014-02-03 LAB — PROTEIN ELECTROPHORESIS, SERUM, WITH REFLEX
Albumin ELP: 53.5 % — ABNORMAL LOW (ref 55.8–66.1)
Alpha-1-Globulin: 9.4 % — ABNORMAL HIGH (ref 2.9–4.9)
Alpha-2-Globulin: 10.4 % (ref 7.1–11.8)
Beta 2: 4.6 % (ref 3.2–6.5)
Beta Globulin: 5.5 % (ref 4.7–7.2)
Gamma Globulin: 16.6 % (ref 11.1–18.8)
Total Protein, Serum Electrophoresis: 6.6 g/dL (ref 6.0–8.3)

## 2014-02-03 LAB — IGG, IGA, IGM
IGA: 181 mg/dL (ref 69–380)
IgG (Immunoglobin G), Serum: 1160 mg/dL (ref 690–1700)
IgM, Serum: 56 mg/dL (ref 52–322)

## 2014-02-03 LAB — KAPPA/LAMBDA LIGHT CHAINS
Kappa free light chain: 1.52 mg/dL (ref 0.33–1.94)
Kappa:Lambda Ratio: 0.82 (ref 0.26–1.65)
Lambda Free Lght Chn: 1.86 mg/dL (ref 0.57–2.63)

## 2014-02-03 LAB — IFE INTERPRETATION

## 2014-02-06 ENCOUNTER — Other Ambulatory Visit: Payer: Self-pay

## 2014-02-06 MED ORDER — INTERFERON BETA-1B 0.3 MG ~~LOC~~ KIT: 0.2500 mg | PACK | SUBCUTANEOUS | Status: DC

## 2014-02-07 NOTE — Progress Notes (Signed)
This encounter was created in error - please disregard.

## 2014-02-17 ENCOUNTER — Ambulatory Visit (INDEPENDENT_AMBULATORY_CARE_PROVIDER_SITE_OTHER): Payer: BC Managed Care – PPO | Admitting: Emergency Medicine

## 2014-02-17 VITALS — BP 122/64 | HR 62 | Temp 98.2°F | Resp 18 | Ht 61.5 in | Wt 201.0 lb

## 2014-02-17 DIAGNOSIS — G35 Multiple sclerosis: Secondary | ICD-10-CM

## 2014-02-17 DIAGNOSIS — E538 Deficiency of other specified B group vitamins: Secondary | ICD-10-CM

## 2014-02-17 DIAGNOSIS — M79672 Pain in left foot: Secondary | ICD-10-CM

## 2014-02-17 DIAGNOSIS — M79609 Pain in unspecified limb: Secondary | ICD-10-CM

## 2014-02-17 DIAGNOSIS — L84 Corns and callosities: Secondary | ICD-10-CM

## 2014-02-17 MED ORDER — CYANOCOBALAMIN 1000 MCG/ML IJ SOLN
1000.0000 ug | Freq: Once | INTRAMUSCULAR | Status: AC
  Administered 2014-02-17: 1000 ug via INTRAMUSCULAR

## 2014-02-17 NOTE — Progress Notes (Signed)
   Subjective:    Patient ID: Morgan Espinoza, female    DOB: January 24, 1951, 63 y.o.   MRN: [id number] This chart was scribed for Arlyss Queen, MD by Vernell Barrier, Medical Scribe. The patient was seen in room 5. This patient's care was started at 12:59 PM.  HPI HPI Comments: Morgan Espinoza is a 63 y.o. female w/ hx of MX and myeloma presents to the [facility] with multiple complaints. Reports left foot pain primarily on the 5th metatarsal; onset 1 day ago. Believes pain is due to bunions. Also reports pain in the left side of her neck sometimes after she gets her hair done and pain in the left upper arm. States neck pain occasionally radiates down into her upper left arm. No other concerns or complaints. Not scheduled to see Dr. Marin Olp for another 3 months. Cardiologist is Dr. Einar Gip.   Review of Systems  Musculoskeletal: Positive for myalgias and neck pain.   Objective:  Physical Exam CONSTITUTIONAL: Well developed/well nourished HEAD: Normocephalic/atraumatic EYES: EOMI/PERRL ENMT: Mucous membranes moist NECK: supple no meningeal signs SPINE:entire spine nontender CV: S1/S2 noted, no murmurs/rubs/gallops noted LUNGS: Lungs are clear to auscultation bilaterally, no apparent distress ABDOMEN: soft, nontender, no rebound or guarding GU:no cva tenderness NEURO: Pt is awake/alert, moves all extremitiesx4 EXTREMITIES: pulses normal, full ROM  Left foot has a callas over base of left 5th toe which was pared. Tenderness over the biceps atttachement of the left arm SKIN: warm, color normal PSYCH: no abnormalities of mood noted  Assessment & Plan:  She will use Tylenol for her arm pain. I pared down the callus on her foot which she tolerated well  I personally performed the services described in this documentation, which was scribed in my presence. The recorded information has been reviewed and is accurate.

## 2014-04-28 ENCOUNTER — Ambulatory Visit (INDEPENDENT_AMBULATORY_CARE_PROVIDER_SITE_OTHER): Payer: BC Managed Care – PPO

## 2014-04-28 DIAGNOSIS — E538 Deficiency of other specified B group vitamins: Secondary | ICD-10-CM

## 2014-04-28 MED ORDER — CYANOCOBALAMIN 1000 MCG/ML IJ SOLN
1000.0000 ug | Freq: Once | INTRAMUSCULAR | Status: AC
  Administered 2014-04-28: 1000 ug via INTRAMUSCULAR

## 2014-04-28 NOTE — Progress Notes (Deleted)
   Subjective:    Patient ID: Morgan Espinoza, female    DOB: [birth year], 63 y.o.   MRN: [id number]  HPI    Review of Systems     Objective:   Physical Exam        Assessment & Plan:

## 2014-04-28 NOTE — Progress Notes (Signed)
   Subjective:    Patient ID: Morgan Espinoza, female    DOB: 1950/09/10, 63 y.o.   MRN: [id number]  HPI Patient was here for her monthly vitamin B-12 injection.     Review of Systems     Objective:   Physical Exam        Assessment & Plan:

## 2014-04-28 NOTE — Progress Notes (Deleted)
   Subjective:    Patient ID: Morgan Espinoza, female    DOB: 1951-04-30, 63 y.o.   MRN: [id number]  HPI    Review of Systems     Objective:   Physical Exam        Assessment & Plan:

## 2014-05-03 ENCOUNTER — Ambulatory Visit (HOSPITAL_BASED_OUTPATIENT_CLINIC_OR_DEPARTMENT_OTHER): Payer: BC Managed Care – PPO

## 2014-05-03 ENCOUNTER — Ambulatory Visit (HOSPITAL_BASED_OUTPATIENT_CLINIC_OR_DEPARTMENT_OTHER): Payer: BC Managed Care – PPO | Admitting: Hematology & Oncology

## 2014-05-03 ENCOUNTER — Other Ambulatory Visit (HOSPITAL_BASED_OUTPATIENT_CLINIC_OR_DEPARTMENT_OTHER): Payer: BC Managed Care – PPO | Admitting: Lab

## 2014-05-03 VITALS — BP 150/69 | HR 60 | Temp 97.7°F | Resp 20 | Ht 61.5 in | Wt 199.0 lb

## 2014-05-03 DIAGNOSIS — C9000 Multiple myeloma not having achieved remission: Secondary | ICD-10-CM

## 2014-05-03 DIAGNOSIS — D509 Iron deficiency anemia, unspecified: Secondary | ICD-10-CM

## 2014-05-03 DIAGNOSIS — C9030 Solitary plasmacytoma not having achieved remission: Secondary | ICD-10-CM

## 2014-05-03 LAB — CMP (CANCER CENTER ONLY)
ALBUMIN: 3.4 g/dL (ref 3.3–5.5)
ALT: 25 U/L (ref 10–47)
AST: 29 U/L (ref 11–38)
Alkaline Phosphatase: 55 U/L (ref 26–84)
BUN, Bld: 18 mg/dL (ref 7–22)
CHLORIDE: 105 meq/L (ref 98–108)
CO2: 27 mEq/L (ref 18–33)
CREATININE: 1.2 mg/dL (ref 0.6–1.2)
Calcium: 8.8 mg/dL (ref 8.0–10.3)
Glucose, Bld: 89 mg/dL (ref 73–118)
POTASSIUM: 3.5 meq/L (ref 3.3–4.7)
Sodium: 144 mEq/L (ref 128–145)
TOTAL PROTEIN: 7 g/dL (ref 6.4–8.1)
Total Bilirubin: 0.6 mg/dl (ref 0.20–1.60)

## 2014-05-03 MED ORDER — ZOLEDRONIC ACID 4 MG/5ML IV CONC
3.0000 mg | Freq: Once | INTRAVENOUS | Status: AC
  Administered 2014-05-03: 3 mg via INTRAVENOUS
  Filled 2014-05-03: qty 3.75

## 2014-05-03 NOTE — Patient Instructions (Signed)

## 2014-05-03 NOTE — Progress Notes (Signed)
Man  [phone]) [phone] [phone]) [phone]  ID: SHAKINAH NAVIS OB: 14-Jul-1951 MR#: [id number] MGN#:[id number] Patient Care Team: Darlyne Russian, MD as PCP - General (Family Medicine)  DIAGNOSIS: IgG Kappa plasmacytoma  Iron deficiency anemia  INTERVAL HISTORY: Ms. Helbig is here today for a follow-up. She gets Zometa every 2 months. She is doing well. She says she gets a little tired at times. She denies fever, chills, n/v, cough, rash, headache, dizziness, SOB, chest pain, palpitations, abdominal pain, constipation, diarrhea, blood in urine or stool. She has had no swelling, tenderness, numbness or tingling in her extremities. No bleeding or pain. In April, her ferritin was 649. Her iron saturation rate was only 29%. Her last monoclonal studies did not detect a monoclonal spike. Her IgG level was 1440 mg/dL. Her Kappa light chain was 2.06 mg/dL. Her appetite is good and she is drinking plenty of fluids.  CURRENT TREATMENT: Zometa 4 mg IV q. 3 months IV iron-last received Feraheme in January with a dose of 1020 mg  REVIEW OF SYSTEMS: All other 10 point review of systems is negative.  PAST MEDICAL HISTORY: Past Medical History  Diagnosis Date  . Hypertension   . GERD (gastroesophageal reflux disease)   . Asthma   . Multiple sclerosis   . Hyperlipidemia   . Vertigo   . Vitamin D deficiency   . Obesity   . Achilles tendonitis   . OA (osteoarthritis) of knee   . Anemia, iron deficiency 07/23/2012  . 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 Cancer 05/2012    plasma cell myeloma  . S/P radiation therapy 08/12/12 - 09/21/12    left Anterior 2nd Rib Lesion / 50.4 GY / 28 Fractions   PAST SURGICAL HISTORY: Past Surgical History  Procedure Laterality Date  . Cholecystectomy      lap choley  . Abdominal hysterectomy      fibroids  . Breast surgery      left breast biopsy-benign  . Rotator cuff repair    . Bone biopsy  05/17/12    Left 2nd Rib, Plasma Cell  Myeloma   FAMILY HISTORY Family History  Problem Relation Age of Onset  . Dementia Mother   . Hypertension Mother   . Cancer Father     lung; +tobacco  . Diabetes Sister   . Cancer Brother   . Diabetes Sister    GYNECOLOGIC HISTORY:  No LMP recorded. Patient is postmenopausal.   SOCIAL HISTORY:  History   Social History  . Marital Status: Widowed    Spouse Name: N/A    Number of Children: 1  . Years of Education: 12   Occupational History  .    Marland [employer] Retired     Social History Main Topics  . Smoking status: Former Smoker -- 0.10 packs/day for 2 years    Types: Cigarettes    Start date: 10/05/1976    Quit date: 04/19/1979  . Smokeless tobacco: Never Used     Comment: quit 35 years ago   . Alcohol Use: No  . Drug Use: No  . Sexual Activity: No   Other Topics Concern  . 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    Patient is retired.    Patient is widowed.    Patient is right handed.    Patient has a high school education.  Patient has 1 child.    ADVANCED DIRECTIVES: <no information>  HEALTH MAINTENANCE: History  Substance Use Topics  . Smoking status: Former Smoker -- 0.10 packs/day for 2 years    Types: Cigarettes    Start date: 10/05/1976    Quit date: 04/19/1979  . Smokeless tobacco: Never Used     Comment: quit 35 years ago   . Alcohol Use: No   Colonoscopy: PAP: Bone density: Lipid panel:  Allergies  Allergen Reactions  . Celebrex [Celecoxib]   . Dristan Other (See Comments)    "made my chest tight"  . Robitussin (Alcohol Free) [Guaifenesin] Other (See Comments)    "made my chest tight"  . Ropinirole Hcl Other (See Comments)    "made my chest tight"  . Tramadol Other (See Comments)    "made my chest tight"   Current Outpatient Prescriptions  Medication Sig Dispense Refill  . albuterol (PROVENTIL HFA;VENTOLIN HFA) 108 (90 BASE) MCG/ACT inhaler Inhale 2 puffs into the  lungs every 6 (six) hours as needed for wheezing or shortness of breath. As needed only  1 Inhaler  1  . amLODipine (NORVASC) 10 MG tablet take 1 tablet by mouth once daily for hypertension  30 tablet  5  . aspirin 81 MG tablet Take 81 mg by mouth daily.      Marland Kitchen azelastine (ASTELIN) 0.1 % nasal spray Place 1 spray into both nostrils 2 (two) times daily. Use in each nostril as directed  30 mL  12  . cetirizine (ZYRTEC) 10 MG tablet Take 1 tablet (10 mg total) by mouth daily.  30 tablet  11  . Cyanocobalamin 1000 MCG/ML KIT Inject as directed every 30 (thirty) days.      . diclofenac sodium (VOLTAREN) 1 % GEL Applied 2 g to the left knee 3 times a day  100 g  2  . docusate sodium (COLACE) 100 MG capsule Take 100 mg by mouth as needed for constipation.      . ferrous sulfate 325 (65 FE) MG tablet take 1 tablet by mouth once daily WITH BREAKFAST  30 tablet  11  . Interferon Beta-1b (BETASERON) 0.3 MG KIT injection Inject 0.25 mg into the skin every other day.  1 kit  6  . losartan (COZAAR) 50 MG tablet Take one tablet daily  30 tablet  11  . metoprolol succinate (TOPROL-XL) 25 MG 24 hr tablet Take 25 mg by mouth daily.       Marland Kitchen omeprazole (PRILOSEC) 20 MG capsule Take 1 capsule (20 mg total) by mouth daily.  30 capsule  5  . ranitidine (ZANTAC) 150 MG tablet Take one tablet twice a day  60 tablet  11  . sucralfate (CARAFATE) 1 GM/10ML suspension Take 2 teaspoonfuls by mouth every 6 hrs.  420 mL  4  . chlorpheniramine-HYDROcodone (TUSSIONEX PENNKINETIC ER) 10-8 MG/5ML LQCR Take 5 mLs by mouth every 12 (twelve) hours as needed for cough.  140 mL  0  . fluticasone (FLOVENT DISKUS) 50 MCG/BLIST diskus inhaler Inhale 1 puff into the lungs 2 (two) times daily.      Marland Kitchen HYDROcodone-acetaminophen (NORCO/VICODIN) 5-325 MG per tablet Take 1 tablet by mouth every 6 (six) hours as needed for pain.  30 tablet  1   No current facility-administered medications for this visit.   Facility-Administered Medications  Ordered in Other Visits  Medication Dose Route Frequency Provider Last Rate Last Dose  . 0.9 %  sodium chloride infusion   Intravenous Continuous Rudell Cobb  Ennever, MD       OBJECTIVE: Filed Vitals:   05/03/14 1247  BP: 150/69  Pulse: 60  Temp: 97.7 F (36.5 C)  Resp: 20   Body mass index is 37 kg/(m^2). ECOG FS:0 - Asymptomatic Ocular: Sclerae unicteric, pupils equal, round and reactive to light Ear-nose-throat: Oropharynx clear, dentition fair Lymphatic: No cervical or supraclavicular adenopathy Lungs no rales or rhonchi, good excursion bilaterally Heart regular rate and rhythm, no murmur appreciated Abd soft, nontender, positive bowel sounds MSK no focal spinal tenderness, no joint edema Neuro: non-focal, well-oriented, appropriate affect Breasts: Deferred  LAB RESULTS: CMP     Component Value Date/Time   NA 144 05/03/2014 1135   NA 142 11/30/2013 0908   NA 140 01/28/2013 1421   K 3.5 05/03/2014 1135   K 3.9 11/30/2013 0908   CL 105 05/03/2014 1135   CL 106 11/30/2013 0908   CO2 27 05/03/2014 1135   CO2 27 11/30/2013 0908   GLUCOSE 89 05/03/2014 1135   GLUCOSE 80 11/30/2013 0908   GLUCOSE 92 01/28/2013 1421   BUN 18 05/03/2014 1135   BUN 21 11/30/2013 0908   BUN 20 01/28/2013 1421   CREATININE 1.2 05/03/2014 1135   CREATININE 0.70 11/30/2013 0908   CALCIUM 8.8 05/03/2014 1135   CALCIUM 8.6 11/30/2013 0908   PROT 7.0 05/03/2014 1135   PROT 6.5 11/30/2013 0908   PROT 6.9 01/28/2013 1421   ALBUMIN 3.8 11/30/2013 0908   AST 29 05/03/2014 1135   AST 29 11/30/2013 0908   ALT 25 05/03/2014 1135   ALT 28 11/30/2013 0908   ALKPHOS 55 05/03/2014 1135   ALKPHOS 61 11/30/2013 0908   BILITOT 0.60 05/03/2014 1135   BILITOT 0.4 11/30/2013 0908   GFRNONAA >90 02/23/2013 1237   GFRAA >90 02/23/2013 1237   No results found for this basename: SPEP, UPEP,  kappa and lambda light chains   Lab Results  Component Value Date   WBC 4.9 02/01/2014   NEUTROABS 2.4 02/01/2014   HGB 10.7* 02/01/2014   HCT 32.5*  02/01/2014   MCV 96 02/01/2014   PLT 146 02/01/2014   No results found for this basename: LABCA2   No components found with this basename: LABCA125   No results found for this basename: INR,  in the last 168 hours  STUDIES: No results found.  ASSESSMENT/PLAN: Ms. Bastyr is 63 year old female with a plasmacytoma. She had radiation to the left ribs for this in February of 2014.  There is no evidence of recurrence at this time. She is doing well and is asymptomatic.  She will get her Zometa today.  Her CMP was good. We will wait and see what the rest of her labs show.  We will see her back in 3 months for labs and follow-up. She knows to call here with any questions or concerns and to go to the ED in the event of an emergency. We can certainly see her sooner if need be.   Eliezer Bottom, NP 05/03/2014 1:38 PM

## 2014-05-04 ENCOUNTER — Telehealth: Payer: Self-pay | Admitting: Hematology & Oncology

## 2014-05-04 NOTE — Telephone Encounter (Signed)
Mailed dec schedule

## 2014-05-06 ENCOUNTER — Other Ambulatory Visit: Payer: Self-pay | Admitting: Emergency Medicine

## 2014-05-08 ENCOUNTER — Ambulatory Visit (INDEPENDENT_AMBULATORY_CARE_PROVIDER_SITE_OTHER): Payer: BC Managed Care – PPO | Admitting: Emergency Medicine

## 2014-05-08 ENCOUNTER — Other Ambulatory Visit (HOSPITAL_COMMUNITY): Payer: BC Managed Care – PPO

## 2014-05-08 ENCOUNTER — Ambulatory Visit (HOSPITAL_COMMUNITY)
Admission: RE | Admit: 2014-05-08 | Discharge: 2014-05-08 | Disposition: A | Discharge status: Home / Self Care | Payer: BC Managed Care – PPO | Source: Ambulatory Visit | Attending: Emergency Medicine | Admitting: Emergency Medicine

## 2014-05-08 VITALS — BP 134/78 | HR 58 | Temp 98.0°F | Resp 24 | Ht 60.5 in | Wt 197.5 lb

## 2014-05-08 DIAGNOSIS — C9000 Multiple myeloma not having achieved remission: Secondary | ICD-10-CM | POA: Diagnosis not present

## 2014-05-08 DIAGNOSIS — R279 Unspecified lack of coordination: Secondary | ICD-10-CM | POA: Insufficient documentation

## 2014-05-08 DIAGNOSIS — G35 Multiple sclerosis: Secondary | ICD-10-CM | POA: Diagnosis not present

## 2014-05-08 DIAGNOSIS — R51 Headache: Secondary | ICD-10-CM | POA: Diagnosis not present

## 2014-05-08 DIAGNOSIS — R270 Ataxia, unspecified: Secondary | ICD-10-CM

## 2014-05-08 LAB — POCT CBC
Granulocyte percent: 63.2 %G (ref 37–80)
HEMATOCRIT: 38.6 % (ref 37.7–47.9)
HEMOGLOBIN: 12.3 g/dL (ref 12.2–16.2)
Lymph, poc: 2.1 (ref 0.6–3.4)
MCH: 31.1 pg (ref 27–31.2)
MCHC: 31.9 g/dL (ref 31.8–35.4)
MCV: 97.6 fL — AB (ref 80–97)
MID (cbc): 0.5 (ref 0–0.9)
MPV: 7.6 fL (ref 0–99.8)
POC Granulocyte: 4.5 (ref 2–6.9)
POC LYMPH PERCENT: 29.8 %L (ref 10–50)
POC MID %: 7 %M (ref 0–12)
Platelet Count, POC: 171 10*3/uL (ref 142–424)
RBC: 3.96 M/uL — AB (ref 4.04–5.48)
RDW, POC: 14.2 %
WBC: 7.1 10*3/uL (ref 4.6–10.2)

## 2014-05-08 NOTE — Progress Notes (Signed)
[facility] [address] [place] [address] [phone]  Date:  05/08/2014   Name:  Morgan Espinoza   DOB:  22-Aug-1950   MRN:  [id number]  PCP:  Jenny Reichmann, MD    Chief Complaint: Dizziness and Headache   History of Present Illness:  Morgan Espinoza is a 63 y.o. very pleasant female patient who presents with the following:  Since Saturday has experienced headaches that are frontal and last about an hour.  No visual complaints.   No nasal congestion or drainage or post nasal drip. No fever or chills.  No cough or coryza.  No stool change nausea or vomiting.   Often associated with passing dizziness and responds to tylenol which she only takes episodically.  She has not fallen No improvement with over the counter medications or other home remedies. Denies other complaint or health concern today.    Patient Active Problem List   Diagnosis Date Noted  . Encounter for long-term (current) use of other medications 01/28/2013  . 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 Multiple sclerosis   . Anemia   . Hyperlipidemia   . Vertigo   . Vitamin D deficiency   . Obesity   . Achilles tendonitis   . OA (osteoarthritis) of knee   . Anemia, iron deficiency 07/23/2012  . Myeloma 07/12/2012  . 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 Cancer 05/11/2012  . Rib lesion 04/28/2012  . Chest pain 06/05/2011  . Hypertension 06/05/2011  . Asthma 06/05/2011  . GERD (gastroesophageal reflux disease) 06/05/2011  . MS (multiple sclerosis) 06/05/2011    Past Medical History  Diagnosis Date  . Hypertension   . GERD (gastroesophageal reflux disease)   . Asthma   . Multiple sclerosis   . Hyperlipidemia   . Vertigo   . Vitamin D deficiency   . Obesity   . Achilles tendonitis   . OA (osteoarthritis) of knee   . Anemia, iron deficiency 07/23/2012  . 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 Cancer 05/2012    plasma cell myeloma  . S/P radiation therapy 08/12/12 - 09/21/12    left  Anterior 2nd Rib Lesion / 50.4 GY / 28 Fractions    Past Surgical History  Procedure Laterality Date  . Cholecystectomy      lap choley  . Abdominal hysterectomy      fibroids  . Breast surgery      left breast biopsy-benign  . Rotator cuff repair    . Bone biopsy  05/17/12    Left 2nd Rib, Plasma Cell Myeloma    History  Substance Use Topics  . Smoking status: Former Smoker -- 0.10 packs/day for 2 years    Types: Cigarettes    Start date: 10/05/1976    Quit date: 04/19/1979  . Smokeless tobacco: Never Used     Comment: quit 35 years ago   . Alcohol Use: No    Family History  Problem Relation Age of Onset  . Dementia Mother   . Hypertension Mother   . Cancer Father     lung; +tobacco  . Diabetes Sister   . Cancer Brother   . Diabetes Sister     Allergies  Allergen Reactions  . Celebrex [Celecoxib]   . Dristan Other (See Comments)    "made my chest tight"  . Robitussin (Alcohol Free) [Guaifenesin] Other (See Comments)    "made my chest tight"  . Ropinirole Hcl Other (See Comments)    "  made my chest tight"  . Tramadol Other (See Comments)    "made my chest tight"    Medication list has been reviewed and updated.  Current Outpatient Prescriptions on File Prior to Visit  Medication Sig Dispense Refill  . albuterol (PROVENTIL HFA;VENTOLIN HFA) 108 (90 BASE) MCG/ACT inhaler Inhale 2 puffs into the lungs every 6 (six) hours as needed for wheezing or shortness of breath. As needed only  1 Inhaler  1  . amLODipine (NORVASC) 10 MG tablet take 1 tablet by mouth once daily for hypertension  30 tablet  5  . aspirin 81 MG tablet Take 81 mg by mouth daily.      Marland Kitchen azelastine (ASTELIN) 0.1 % nasal spray Place 1 spray into both nostrils 2 (two) times daily. Use in each nostril as directed  30 mL  12  . CARAFATE 1 GM/10ML suspension take 2 teaspoonfuls by mouth every 6 hours  420 mL  2  . cetirizine (ZYRTEC) 10 MG tablet Take 1 tablet (10 mg total) by mouth daily.  30 tablet   11  . chlorpheniramine-HYDROcodone (TUSSIONEX PENNKINETIC ER) 10-8 MG/5ML LQCR Take 5 mLs by mouth every 12 (twelve) hours as needed for cough.  140 mL  0  . Cyanocobalamin 1000 MCG/ML KIT Inject as directed every 30 (thirty) days.      . diclofenac sodium (VOLTAREN) 1 % GEL Applied 2 g to the left knee 3 times a day  100 g  2  . docusate sodium (COLACE) 100 MG capsule Take 100 mg by mouth as needed for constipation.      . ferrous sulfate 325 (65 FE) MG tablet take 1 tablet by mouth once daily WITH BREAKFAST  30 tablet  11  . fluticasone (FLOVENT DISKUS) 50 MCG/BLIST diskus inhaler Inhale 1 puff into the lungs 2 (two) times daily.      Marland Kitchen HYDROcodone-acetaminophen (NORCO/VICODIN) 5-325 MG per tablet Take 1 tablet by mouth every 6 (six) hours as needed for pain.  30 tablet  1  . Interferon Beta-1b (BETASERON) 0.3 MG KIT injection Inject 0.25 mg into the skin every other day.  1 kit  6  . losartan (COZAAR) 50 MG tablet Take one tablet daily  30 tablet  11  . metoprolol succinate (TOPROL-XL) 25 MG 24 hr tablet Take 25 mg by mouth daily.       Marland Kitchen omeprazole (PRILOSEC) 20 MG capsule Take 1 capsule (20 mg total) by mouth daily.  30 capsule  5  . ranitidine (ZANTAC) 150 MG tablet Take one tablet twice a day  60 tablet  11   Current Facility-Administered Medications on File Prior to Visit  Medication Dose Route Frequency Provider Last Rate Last Dose  . 0.9 %  sodium chloride infusion   Intravenous Continuous [name], MD        Review of Systems:  As per HPI, otherwise negative.    Physical Examination: Filed Vitals:   05/08/14 1958  BP: 134/78  Pulse: 58  Temp: 98 F (36.7 C)  Resp: 24   Filed Vitals:   05/08/14 1958  Height: 5' 0.5" (1.537 m)  Weight: 197 lb 8 oz (89.585 kg)   Body mass index is 37.92 kg/(m^2). Ideal Body Weight: Weight in (lb) to have BMI = 25: 129.9  GEN: WDWN, NAD, Non-toxic, A & O x 3 HEENT: Atraumatic, Normocephalic. Neck supple. No masses, No  LAD. Ears and Nose: No external deformity. CV: RRR, No M/G/R. No JVD. No thrill. No  extra heart sounds. PULM: CTA B, no wheezes, crackles, rhonchi. No retractions. No resp. distress. No accessory muscle use. ABD: S, NT, ND, +BS. No rebound. No HSM. EXTR: No c/c/e NEURO Normal gait. Impaired tandem gait.  Normal romberg  CN 2-12 intact PRRERLA EOMI  PSYCH: Normally interactive. Conversant. Not depressed or anxious appearing.  Calm demeanor.    Assessment and Plan: Ataxia Headache CT labs  Signed,  Ellison Carwin, MD

## 2014-05-08 NOTE — Telephone Encounter (Signed)
Dr Everlene Farrier, you saw pt in July, but not for this med. Do you want to give RFs?

## 2014-05-08 NOTE — Patient Instructions (Signed)
You are going to [facility] to have a CT scan done. You are NOT to sit in the ER or see an ER doctor. You are to register and then go straight to the CT department. After your scan they will call Dr. Ouida Sills with the results and he will call you.

## 2014-05-09 LAB — COMPREHENSIVE METABOLIC PANEL
ALT: 21 U/L (ref 0–35)
AST: 24 U/L (ref 0–37)
Albumin: 4 g/dL (ref 3.5–5.2)
Alkaline Phosphatase: 67 U/L (ref 39–117)
BUN: 15 mg/dL (ref 6–23)
CALCIUM: 8.8 mg/dL (ref 8.4–10.5)
CHLORIDE: 105 meq/L (ref 96–112)
CO2: 29 meq/L (ref 19–32)
CREATININE: 0.67 mg/dL (ref 0.50–1.10)
Glucose, Bld: 92 mg/dL (ref 70–99)
Potassium: 4.3 mEq/L (ref 3.5–5.3)
Sodium: 141 mEq/L (ref 135–145)
Total Bilirubin: 0.4 mg/dL (ref 0.2–1.2)
Total Protein: 6.8 g/dL (ref 6.0–8.3)

## 2014-05-16 ENCOUNTER — Other Ambulatory Visit: Payer: Self-pay | Admitting: Emergency Medicine

## 2014-05-16 NOTE — Telephone Encounter (Signed)
Dr Everlene Farrier, I don't see GERD addressed recently, but pt has been in to see you with other concerns recently. Is it OK to RF this?

## 2014-05-31 ENCOUNTER — Encounter: Payer: Self-pay | Admitting: Emergency Medicine

## 2014-06-05 ENCOUNTER — Ambulatory Visit (INDEPENDENT_AMBULATORY_CARE_PROVIDER_SITE_OTHER): Payer: BC Managed Care – PPO

## 2014-06-05 DIAGNOSIS — E538 Deficiency of other specified B group vitamins: Secondary | ICD-10-CM

## 2014-06-05 MED ORDER — CYANOCOBALAMIN 1000 MCG/ML IJ SOLN
1000.0000 ug | Freq: Once | INTRAMUSCULAR | Status: AC
  Administered 2014-06-05: 1000 ug via INTRAMUSCULAR

## 2014-06-05 NOTE — Progress Notes (Signed)
   Subjective:    Patient ID: Morgan Espinoza, female    DOB: [birth year], 63 y.o.   MRN: [id number]  HPI Pt here for vitamin B12 injection. Pt routinely receives injection.   Review of Systems     Objective:   Physical Exam        Assessment & Plan:  Vit B12 administered.

## 2014-06-28 ENCOUNTER — Ambulatory Visit (INDEPENDENT_AMBULATORY_CARE_PROVIDER_SITE_OTHER): Payer: BC Managed Care – PPO | Admitting: Emergency Medicine

## 2014-06-28 ENCOUNTER — Ambulatory Visit (INDEPENDENT_AMBULATORY_CARE_PROVIDER_SITE_OTHER): Payer: BC Managed Care – PPO

## 2014-06-28 VITALS — BP 146/88 | HR 79 | Temp 98.0°F | Resp 18 | Ht 61.0 in | Wt 201.0 lb

## 2014-06-28 DIAGNOSIS — M79675 Pain in left toe(s): Secondary | ICD-10-CM

## 2014-06-28 DIAGNOSIS — M542 Cervicalgia: Secondary | ICD-10-CM

## 2014-06-28 DIAGNOSIS — M25512 Pain in left shoulder: Secondary | ICD-10-CM

## 2014-06-28 DIAGNOSIS — M25511 Pain in right shoulder: Secondary | ICD-10-CM

## 2014-06-28 NOTE — Progress Notes (Addendum)
Subjective:  This chart was scribed for Morgan Russian, MD by Ladene Artist, ED Scribe. The patient was seen in room 9. Patient's care was started at 12:29 PM.   Patient ID: Morgan Espinoza, female    DOB: 07/06/1951, 63 y.o.   MRN: [id number]  Chief Complaint  Patient presents with  . Shoulder Pain    both shoulders x2 months  . Foot Pain    right and left foot pain x1 week  . Toe Pain    left big toe x1 week   HPI HPI Comments: [name] is a 63 y.o. female, with a h/o multiple sclerosis, HTN, vertigo, hyperlipidemia, CA, who presents to the [facility] complaining of bilateral shoulder pain, neck pain and L great toe pain.   Shoulder Pain Pt reports bilateral shoulder pain over the past 2 months. She states that she has to "rub her shoulders down" nightly to relieve pain. No previous shoulder XRs.  Neck Pain Pt reports intermittent neck pain over the past few months. She states that she does not experience neck pain on a daily basis. Neck pain is exacerbated with movement. No previous neck XRs.  Toe Pain Pt reports L great toe pain onset 1 week ago. She reports that pain is worse at night and is so severe that it keeps her up at night.   Past Medical History  Diagnosis Date  . Hypertension   . GERD (gastroesophageal reflux disease)   . Asthma   . Multiple sclerosis   . Hyperlipidemia   . Vertigo   . Vitamin D deficiency   . Obesity   . Achilles tendonitis   . OA (osteoarthritis) of knee   . Anemia, iron deficiency 07/23/2012  . 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 Cancer 05/2012    plasma cell myeloma  . S/P radiation therapy 08/12/12 - 09/21/12    left Anterior 2nd Rib Lesion / 50.4 GY / 28 Fractions   Current Outpatient Prescriptions on File Prior to Visit  Medication Sig Dispense Refill  . albuterol (PROVENTIL HFA;VENTOLIN HFA) 108 (90 BASE) MCG/ACT inhaler Inhale 2 puffs into the lungs every 6 (six) hours as needed for wheezing or  shortness of breath. As needed only 1 Inhaler 1  . amLODipine (NORVASC) 10 MG tablet take 1 tablet by mouth once daily for hypertension 30 tablet 5  . aspirin 81 MG tablet Take 81 mg by mouth daily.    Marland Kitchen azelastine (ASTELIN) 0.1 % nasal spray Place 1 spray into both nostrils 2 (two) times daily. Use in each nostril as directed 30 mL 12  . CARAFATE 1 GM/10ML suspension take 2 teaspoonfuls by mouth every 6 hours 420 mL 2  . cetirizine (ZYRTEC) 10 MG tablet Take 1 tablet (10 mg total) by mouth daily. 30 tablet 11  . chlorpheniramine-HYDROcodone (TUSSIONEX PENNKINETIC ER) 10-8 MG/5ML LQCR Take 5 mLs by mouth every 12 (twelve) hours as needed for cough. 140 mL 0  . Cyanocobalamin 1000 MCG/ML KIT Inject as directed every 30 (thirty) days.    . diclofenac sodium (VOLTAREN) 1 % GEL Applied 2 g to the left knee 3 times a day 100 g 2  . docusate sodium (COLACE) 100 MG capsule Take 100 mg by mouth as needed for constipation.    . ferrous sulfate 325 (65 FE) MG tablet take 1 tablet by mouth once daily WITH BREAKFAST 30 tablet 11  . fluticasone (FLOVENT DISKUS) 50 MCG/BLIST diskus  inhaler Inhale 1 puff into the lungs 2 (two) times daily.    Marland Kitchen HYDROcodone-acetaminophen (NORCO/VICODIN) 5-325 MG per tablet Take 1 tablet by mouth every 6 (six) hours as needed for pain. 30 tablet 1  . Interferon Beta-1b (BETASERON) 0.3 MG KIT injection Inject 0.25 mg into the skin every other day. 1 kit 6  . losartan (COZAAR) 50 MG tablet Take one tablet daily 30 tablet 11  . metoprolol succinate (TOPROL-XL) 25 MG 24 hr tablet Take 25 mg by mouth daily.     Marland Kitchen omeprazole (PRILOSEC) 20 MG capsule take 1 capsule by mouth once daily 30 capsule 5  . ranitidine (ZANTAC) 150 MG tablet Take one tablet twice a day 60 tablet 11   Current Facility-Administered Medications on File Prior to Visit  Medication Dose Route Frequency Provider Last Rate Last Dose  . 0.9 %  sodium chloride infusion   Intravenous Continuous [name], MD    Stopped at 11/30/13 1216   Allergies  Allergen Reactions  . Celebrex [Celecoxib]   . Dristan Other (See Comments)    "made my chest tight"  . Robitussin (Alcohol Free) [Guaifenesin] Other (See Comments)    "made my chest tight"  . Ropinirole Hcl Other (See Comments)    "made my chest tight"  . Tramadol Other (See Comments)    "made my chest tight"   Review of Systems  Musculoskeletal: Positive for myalgias, arthralgias and neck pain.  Neurological: Negative for numbness.      Objective:   Physical Exam CONSTITUTIONAL: Well developed/well nourished HEAD: Normocephalic/atraumatic EYES: EOMI/PERRL ENMT: Mucous membranes moist NECK: supple no meningeal signs, tenderness over neck with limited flexion, extension and rotation SPINE/BACK:entire spine nontender CV: S1/S2 noted, no murmurs/rubs/gallops noted LUNGS: Lungs are clear to auscultation bilaterally, no apparent distress ABDOMEN: soft, nontender, no rebound or guarding, bowel sounds noted throughout abdomen GU:no cva tenderness NEURO: Pt is awake/alert/appropriate, moves all extremitiesx4.  No facial droop.   EXTREMITIES: pulses normal/equal, full ROM, tenderness on anterior of both shoulders, pain with internal and external rotation SKIN: warm, color normal PSYCH: no abnormalities of mood noted, alert and oriented to situation   [facility] reading (PRIMARY) by  Dr. the previously noted plasmacytoma is noted in the left second rib. The joint spaces in the shoulders are normal. Neck fails to reveal C5-6 C6-7 degenerative disc disease.   Assessment & Plan:  Shoulder joints look good. She will take up to 4 Tylenol per day and apply ice to her neck and shoulders. I personally performed the services described in this documentation, which was scribed in my presence. The recorded information has been reviewed and is accurate.

## 2014-07-03 ENCOUNTER — Ambulatory Visit (INDEPENDENT_AMBULATORY_CARE_PROVIDER_SITE_OTHER): Payer: BC Managed Care – PPO | Admitting: Internal Medicine

## 2014-07-03 VITALS — BP 130/70 | HR 77 | Temp 98.1°F | Resp 18 | Ht 61.0 in | Wt 203.4 lb

## 2014-07-03 DIAGNOSIS — J040 Acute laryngitis: Secondary | ICD-10-CM

## 2014-07-03 DIAGNOSIS — J302 Other seasonal allergic rhinitis: Secondary | ICD-10-CM

## 2014-07-03 DIAGNOSIS — J069 Acute upper respiratory infection, unspecified: Secondary | ICD-10-CM

## 2014-07-03 MED ORDER — AMOXICILLIN 500 MG PO CAPS: 1000.0000 mg | ORAL_CAPSULE | Freq: Two times a day (BID) | ORAL | Status: DC

## 2014-07-03 NOTE — Progress Notes (Signed)
   Subjective:    Patient ID: Morgan Espinoza, female    DOB: [birth year], 63 y.o.   MRN: [id number]  HPI 63 year old African American female is here today with complaints of a sore throat, headache, cough, chest congestion, body aches, hoarseness and nasal congestion that began this morning. She states that she is producing light yellow mucous and phlegm. She states she has not taken her temperature but feels hot. She states she has only taken her prescribed medications this morning. She states she has not experienced any shortness of breath, chest pains.   Review of Systems     Objective:   Physical Exam  Constitutional: She is oriented to person, place, and time. She appears well-developed and well-nourished. No distress.  HENT:  Head: Normocephalic.  Right Ear: External ear normal.  Left Ear: External ear normal.  Nose: Mucosal edema and rhinorrhea present. No sinus tenderness.  Mouth/Throat: Uvula is midline. No trismus in the jaw. No uvula swelling. Posterior oropharyngeal edema present.  Neck: Normal range of motion. Neck supple. No tracheal deviation present. No thyromegaly present.  Cardiovascular: Normal rate, regular rhythm and normal heart sounds.   Pulmonary/Chest: Effort normal and breath sounds normal. She has no wheezes.  Neurological: She is alert and oriented to person, place, and time. She exhibits normal muscle tone. Coordination normal.  Psychiatric: She has a normal mood and affect.          Assessment & Plan:  URI with minor cough

## 2014-07-03 NOTE — Patient Instructions (Addendum)
Upper Respiratory Infection, Adult An upper respiratory infection (URI) is also sometimes known as the common cold. The upper respiratory tract includes the nose, sinuses, throat, trachea, and bronchi. Bronchi are the airways leading to the lungs. Most people improve within 1 week, but symptoms can last up to 2 weeks. A residual cough may last even longer.  CAUSES Many different viruses can infect the tissues lining the upper respiratory tract. The tissues become irritated and inflamed and often become very moist. Mucus production is also common. A cold is contagious. You can easily spread the virus to others by oral contact. This includes kissing, sharing a glass, coughing, or sneezing. Touching your mouth or nose and then touching a surface, which is then touched by another person, can also spread the virus. SYMPTOMS  Symptoms typically develop 1 to 3 days after you come in contact with a cold virus. Symptoms vary from person to person. They may include:  Runny nose.  Sneezing.  Nasal congestion.  Sinus irritation.  Sore throat.  Loss of voice (laryngitis).  Cough.  Fatigue.  Muscle aches.  Loss of appetite.  Headache.  Low-grade fever. DIAGNOSIS  You might diagnose your own cold based on familiar symptoms, since most people get a cold 2 to 3 times a year. Your caregiver can confirm this based on your exam. Most importantly, your caregiver can check that your symptoms are not due to another disease such as strep throat, sinusitis, pneumonia, asthma, or epiglottitis. Blood tests, throat tests, and X-rays are not necessary to diagnose a common cold, but they may sometimes be helpful in excluding other more serious diseases. Your caregiver will decide if any further tests are required. RISKS AND COMPLICATIONS  You may be at risk for a more severe case of the common cold if you smoke cigarettes, have chronic heart disease (such as heart failure) or lung disease (such as asthma), or if  you have a weakened immune system. The very young and very old are also at risk for more serious infections. Bacterial sinusitis, middle ear infections, and bacterial pneumonia can complicate the common cold. The common cold can worsen asthma and chronic obstructive pulmonary disease (COPD). Sometimes, these complications can require emergency medical care and may be life-threatening. PREVENTION  The best way to protect against getting a cold is to practice good hygiene. Avoid oral or hand contact with people with cold symptoms. Wash your hands often if contact occurs. There is no clear evidence that vitamin C, vitamin E, echinacea, or exercise reduces the chance of developing a cold. However, it is always recommended to get plenty of rest and practice good nutrition. TREATMENT  Treatment is directed at relieving symptoms. There is no cure. Antibiotics are not effective, because the infection is caused by a virus, not by bacteria. Treatment may include:  Increased fluid intake. Sports drinks offer valuable electrolytes, sugars, and fluids.  Breathing heated mist or steam (vaporizer or shower).  Eating chicken soup or other clear broths, and maintaining good nutrition.  Getting plenty of rest.  Using gargles or lozenges for comfort.  Controlling fevers with ibuprofen or acetaminophen as directed by your caregiver.  Increasing usage of your inhaler if you have asthma. Zinc gel and zinc lozenges, taken in the first 24 hours of the common cold, can shorten the duration and lessen the severity of symptoms. Pain medicines may help with fever, muscle aches, and throat pain. A variety of non-prescription medicines are available to treat congestion and runny nose. Your caregiver   can make recommendations and may suggest nasal or lung inhalers for other symptoms.  HOME CARE INSTRUCTIONS   Only take over-the-counter or prescription medicines for pain, discomfort, or fever as directed by your  caregiver.  Use a warm mist humidifier or inhale steam from a shower to increase air moisture. This may keep secretions moist and make it easier to breathe.  Drink enough water and fluids to keep your urine clear or pale yellow.  Rest as needed.  Return to work when your temperature has returned to normal or as your caregiver advises. You may need to stay home longer to avoid infecting others. You can also use a face mask and careful hand washing to prevent spread of the virus. SEEK MEDICAL CARE IF:   After the first few days, you feel you are getting worse rather than better.  You need your caregiver's advice about medicines to control symptoms.  You develop chills, worsening shortness of breath, or brown or red sputum. These may be signs of pneumonia.  You develop yellow or brown nasal discharge or pain in the face, especially when you bend forward. These may be signs of sinusitis.  You develop a fever, swollen neck glands, pain with swallowing, or white areas in the back of your throat. These may be signs of strep throat. SEEK IMMEDIATE MEDICAL CARE IF:   You have a fever.  You develop severe or persistent headache, ear pain, sinus pain, or chest pain.  You develop wheezing, a prolonged cough, cough up blood, or have a change in your usual mucus (if you have chronic lung disease).  You develop sore muscles or a stiff neck. Document Released: 01/21/2001 Document Revised: 10/20/2011 Document Reviewed: 11/02/2013 [facility] Patient Information 2015 [place], [place]. This information is not intended to replace advice given to you by your health care provider. Make sure you discuss any questions you have with your health care provider. Laryngitis At the top of your windpipe is your voice box. It is the source of your voice. Inside your voice box are 2 bands of muscles called vocal cords. When you breathe, your vocal cords are relaxed and open so that air can get into the lungs. When you  decide to say something, these cords come together and vibrate. The sound from these vibrations goes into your throat and comes out through your mouth as sound. Laryngitis is an inflammation of the vocal cords that causes hoarseness, cough, loss of voice, sore throat, and dry throat. Laryngitis can be temporary (acute) or long-term (chronic). Most cases of acute laryngitis improve with time.Chronic laryngitis lasts for more than 3 weeks. CAUSES Laryngitis can often be related to excessive smoking, talking, or yelling, as well as inhalation of toxic fumes and allergies. Acute laryngitis is usually caused by a viral infection, vocal strain, measles or mumps, or bacterial infections. Chronic laryngitis is usually caused by vocal cord strain, vocal cord injury, postnasal drip, growths on the vocal cords, or acid reflux. SYMPTOMS   Cough.  Sore throat.  Dry throat. RISK FACTORS  Respiratory infections.  Exposure to irritating substances, such as cigarette smoke, excessive amounts of alcohol, stomach acids, and workplace chemicals.  Voice trauma, such as vocal cord injury from shouting or speaking too loud. DIAGNOSIS  Your cargiver will perform a physical exam. During the physical exam, your caregiver will examine your throat. The most common sign of laryngitis is hoarseness. Laryngoscopy may be necessary to confirm the diagnosis of this condition. This procedure allows your caregiver to  look into the larynx. HOME CARE INSTRUCTIONS  Drink enough fluids to keep your urine clear or pale yellow.  Rest until you no longer have symptoms or as directed by your caregiver.  Breathe in moist air.  Take all medicine as directed by your caregiver.  Do not smoke.  Talk as little as possible (this includes whispering).  Write on paper instead of talking until your voice is back to normal.  Follow up with your caregiver if your condition has not improved after 10 days. SEEK MEDICAL CARE IF:   You  have trouble breathing.  You cough up blood.  You have persistent fever.  You have increasing pain.  You have difficulty swallowing. MAKE SURE YOU:  Understand these instructions.  Will watch your condition.  Will get help right away if you are not doing well or get worse. Document Released: 07/28/2005 Document Revised: 10/20/2011 Document Reviewed: 10/03/2010 [facility] Patient Information 2015 [place], [place]. This information is not intended to replace advice given to you by your health care provider. Make sure you discuss any questions you have with your health care provider.

## 2014-07-07 ENCOUNTER — Other Ambulatory Visit: Payer: Self-pay | Admitting: Emergency Medicine

## 2014-07-07 NOTE — Telephone Encounter (Signed)
Dr Everlene Farrier, you have seen pt very recently for acute issues but don't see any notes on this med recently. Do you want to give RFs?

## 2014-07-27 ENCOUNTER — Encounter: Payer: Self-pay | Admitting: Hematology & Oncology

## 2014-07-27 ENCOUNTER — Ambulatory Visit (HOSPITAL_BASED_OUTPATIENT_CLINIC_OR_DEPARTMENT_OTHER): Payer: BC Managed Care – PPO | Admitting: Hematology & Oncology

## 2014-07-27 ENCOUNTER — Ambulatory Visit (HOSPITAL_BASED_OUTPATIENT_CLINIC_OR_DEPARTMENT_OTHER): Payer: BC Managed Care – PPO | Admitting: Lab

## 2014-07-27 ENCOUNTER — Ambulatory Visit (HOSPITAL_BASED_OUTPATIENT_CLINIC_OR_DEPARTMENT_OTHER): Payer: BC Managed Care – PPO

## 2014-07-27 VITALS — BP 131/62 | HR 58 | Temp 98.0°F | Resp 14 | Ht 61.0 in | Wt 202.0 lb

## 2014-07-27 DIAGNOSIS — D509 Iron deficiency anemia, unspecified: Secondary | ICD-10-CM

## 2014-07-27 DIAGNOSIS — Z8579 Personal history of other malignant neoplasms of lymphoid, hematopoietic and related tissues: Secondary | ICD-10-CM

## 2014-07-27 DIAGNOSIS — C9000 Multiple myeloma not having achieved remission: Secondary | ICD-10-CM

## 2014-07-27 DIAGNOSIS — C9030 Solitary plasmacytoma not having achieved remission: Secondary | ICD-10-CM

## 2014-07-27 DIAGNOSIS — C801 Malignant (primary) neoplasm, unspecified: Secondary | ICD-10-CM

## 2014-07-27 LAB — CBC WITH DIFFERENTIAL (CANCER CENTER ONLY)
BASO#: 0 10*3/uL (ref 0.0–0.2)
BASO%: 0.2 % (ref 0.0–2.0)
EOS%: 14.4 % — AB (ref 0.0–7.0)
Eosinophils Absolute: 0.7 10*3/uL — ABNORMAL HIGH (ref 0.0–0.5)
HCT: 36 % (ref 34.8–46.6)
HEMOGLOBIN: 11.9 g/dL (ref 11.6–15.9)
LYMPH#: 1.4 10*3/uL (ref 0.9–3.3)
LYMPH%: 29 % (ref 14.0–48.0)
MCH: 32.3 pg (ref 26.0–34.0)
MCHC: 33.1 g/dL (ref 32.0–36.0)
MCV: 98 fL (ref 81–101)
MONO#: 0.6 10*3/uL (ref 0.1–0.9)
MONO%: 12.3 % (ref 0.0–13.0)
NEUT%: 44.1 % (ref 39.6–80.0)
NEUTROS ABS: 2.1 10*3/uL (ref 1.5–6.5)
Platelets: 181 10*3/uL (ref 145–400)
RBC: 3.68 10*6/uL — ABNORMAL LOW (ref 3.70–5.32)
RDW: 12.1 % (ref 11.1–15.7)
WBC: 4.7 10*3/uL (ref 3.9–10.0)

## 2014-07-27 LAB — CMP (CANCER CENTER ONLY)
ALT: 27 U/L (ref 10–47)
AST: 34 U/L (ref 11–38)
Albumin: 3.3 g/dL (ref 3.3–5.5)
Alkaline Phosphatase: 60 U/L (ref 26–84)
BILIRUBIN TOTAL: 0.7 mg/dL (ref 0.20–1.60)
BUN: 23 mg/dL — AB (ref 7–22)
CO2: 27 mEq/L (ref 18–33)
CREATININE: 0.8 mg/dL (ref 0.6–1.2)
Calcium: 8.9 mg/dL (ref 8.0–10.3)
Chloride: 104 mEq/L (ref 98–108)
Glucose, Bld: 77 mg/dL (ref 73–118)
Potassium: 3.9 mEq/L (ref 3.3–4.7)
Sodium: 137 mEq/L (ref 128–145)
Total Protein: 6.9 g/dL (ref 6.4–8.1)

## 2014-07-27 LAB — IRON AND TIBC CHCC
%SAT: 29 % (ref 21–57)
Iron: 74 ug/dL (ref 41–142)
TIBC: 255 ug/dL (ref 236–444)
UIBC: 181 ug/dL (ref 120–384)

## 2014-07-27 LAB — FERRITIN CHCC: Ferritin: 983 ng/ml — ABNORMAL HIGH (ref 9–269)

## 2014-07-27 MED ORDER — ZOLEDRONIC ACID 4 MG/100ML IV SOLN
4.0000 mg | Freq: Once | INTRAVENOUS | Status: AC
  Administered 2014-07-27: 4 mg via INTRAVENOUS
  Filled 2014-07-27: qty 100

## 2014-07-27 NOTE — Progress Notes (Signed)
Hematology and Oncology Follow Up Visit  Morgan Espinoza [id number] [birth year] 63 y.o. 07/27/2014   Principle Diagnosis:   IgG Kappa plasmacytoma  Iron deficiency anemia  Current Therapy:    Zometa 4 mg IV q. 2 months IV iron-last received Feraheme in January with a dose of 1020 mg     Interim History:  [name] is back for followup. We see her every 3 months. She's doing quite well. She has no pain. She's had no cough. There's been no nausea vomiting. She's had no change in bowel or bladder habits. We last saw her, her ferritin was 649. Her iron saturation rate was only 29%.  Her last monoclonal studies did not show a monoclonal spike. Her IgG level was 1160 mg/dL. Her Kappa light chain was 1.52 mg/dL.  She's had no fever. She's had no leg swelling. There's been no rashes.   Medications: Current outpatient prescriptions: albuterol (PROVENTIL HFA;VENTOLIN HFA) 108 (90 BASE) MCG/ACT inhaler, Inhale 2 puffs into the lungs every 6 (six) hours as needed for wheezing or shortness of breath. As needed only, Disp: 1 Inhaler, Rfl: 1;  amLODipine (NORVASC) 10 MG tablet, take 1 tablet by mouth once daily for hypertension, Disp: 30 tablet, Rfl: 5;  aspirin 81 MG tablet, Take 81 mg by mouth daily., Disp: , Rfl:  atorvastatin (LIPITOR) 10 MG tablet, Take 10 mg by mouth daily., Disp: , Rfl: ;  CARAFATE 1 GM/10ML suspension, take 10 milliliters (2 teaspoonfuls) by mouth every 6 hours, Disp: 420 mL, Rfl: 5;  Cyanocobalamin 1000 MCG/ML KIT, Inject as directed every 30 (thirty) days., Disp: , Rfl: ;  diclofenac sodium (VOLTAREN) 1 % GEL, Applied 2 g to the left knee 3 times a day, Disp: 100 g, Rfl: 2 docusate sodium (COLACE) 100 MG capsule, Take 100 mg by mouth as needed for constipation., Disp: , Rfl: ;  ferrous sulfate 325 (65 FE) MG tablet, take 1 tablet by mouth once daily WITH BREAKFAST, Disp: 30 tablet, Rfl: 11;  fluticasone (FLOVENT DISKUS) 50 MCG/BLIST diskus inhaler, Inhale 1 puff into the lungs 2  (two) times daily., Disp: , Rfl:  Interferon Beta-1b (BETASERON) 0.3 MG KIT injection, Inject 0.25 mg into the skin every other day., Disp: 1 kit, Rfl: 6;  losartan (COZAAR) 50 MG tablet, Take one tablet daily, Disp: 30 tablet, Rfl: 11;  metoprolol succinate (TOPROL-XL) 25 MG 24 hr tablet, Take 25 mg by mouth daily. , Disp: , Rfl: ;  ranitidine (ZANTAC) 150 MG tablet, Take one tablet twice a day, Disp: 60 tablet, Rfl: 11 omeprazole (PRILOSEC) 20 MG capsule, take 1 capsule by mouth once daily (Patient not taking: Reported on 07/27/2014), Disp: 30 capsule, Rfl: 5 No current facility-administered medications for this visit. Facility-Administered Medications Ordered in Other Visits: 0.9 %  sodium chloride infusion, , Intravenous, Continuous, Morgan Napoleon, MD, Stopped at 11/30/13 1216  Allergies:  Allergies  Allergen Reactions  . Celebrex [Celecoxib]   . Dristan Other (See Comments)    "made my chest tight"  . Robitussin (Alcohol Free) [Guaifenesin] Other (See Comments)    "made my chest tight"  . Ropinirole Hcl Other (See Comments)    "made my chest tight"  . Tramadol Other (See Comments)    "made my chest tight"    Past Medical History, Surgical history, Social history, and Family History were reviewed and updated.  Review of Systems: As above  Physical Exam:  height is 5' 1" (1.549 m) and weight is 202 lb (91.627 kg). Her  oral temperature is 98 F (36.7 C). Her blood pressure is 131/62 and her pulse is 58. Her respiration is 14.   Well developed well nourished after [place]cca female. Her head and neck exam shows no ocular or oral lesions. She is no palpable cervical or supraclavicular lymph nodes. Lungs are clear. Cardiac exam shows a regular rate and rhythm with no murmurs rubs or bruits. Abdomen is soft. She is good bowel sounds. There is no fluid wave. There is no palpable liver or spleen tip. Back exam no tenderness over the spine ribs or hips. Extremities shows no clubbing cyanosis or  edema. Skin exam no rashes. Neurological exam is nonfocal.  Lab Results  Component Value Date   WBC 4.7 07/27/2014   HGB 11.9 07/27/2014   HCT 36.0 07/27/2014   MCV 98 07/27/2014   PLT 181 07/27/2014     Chemistry      Component Value Date/Time   NA 141 05/08/2014 2036   NA 144 05/03/2014 1135   NA 140 01/28/2013 1421   K 4.3 05/08/2014 2036   K 3.5 05/03/2014 1135   CL 105 05/08/2014 2036   CL 105 05/03/2014 1135   CO2 29 05/08/2014 2036   CO2 27 05/03/2014 1135   BUN 15 05/08/2014 2036   BUN 18 05/03/2014 1135   BUN 20 01/28/2013 1421   CREATININE 0.67 05/08/2014 2036   CREATININE 0.70 11/30/2013 0908      Component Value Date/Time   CALCIUM 8.8 05/08/2014 2036   CALCIUM 8.8 05/03/2014 1135   ALKPHOS 67 05/08/2014 2036   ALKPHOS 55 05/03/2014 1135   AST 24 05/08/2014 2036   AST 29 05/03/2014 1135   ALT 21 05/08/2014 2036   ALT 25 05/03/2014 1135   BILITOT 0.4 05/08/2014 2036   BILITOT 0.60 05/03/2014 1135         Impression and Plan: Morgan Espinoza is 63 year old female with a plasmacytoma. She had radiation for this. This is in the left ribs. This was back in February of 2014  So far, we have not found any evidence of recurrence.  I will plan to see her back in 3 more months.  Morgan Napoleon, MD 12/17/201510:03 AM

## 2014-07-27 NOTE — Patient Instructions (Signed)

## 2014-07-27 NOTE — Addendum Note (Signed)
Addended by: Burney Gauze R on: 07/27/2014 10:10 AM   Modules accepted: Orders

## 2014-08-01 ENCOUNTER — Ambulatory Visit (INDEPENDENT_AMBULATORY_CARE_PROVIDER_SITE_OTHER): Payer: BC Managed Care – PPO | Admitting: Adult Health

## 2014-08-01 ENCOUNTER — Encounter: Payer: Self-pay | Admitting: Adult Health

## 2014-08-01 VITALS — BP 106/67 | HR 67 | Ht 62.0 in | Wt 209.4 lb

## 2014-08-01 DIAGNOSIS — G35 Multiple sclerosis: Secondary | ICD-10-CM

## 2014-08-01 LAB — PROTEIN ELECTROPHORESIS, SERUM, WITH REFLEX
ALBUMIN ELP: 53.8 % — AB (ref 55.8–66.1)
Alpha-1-Globulin: 5.4 % — ABNORMAL HIGH (ref 2.9–4.9)
Alpha-2-Globulin: 11.2 % (ref 7.1–11.8)
Beta 2: 5.8 % (ref 3.2–6.5)
Beta Globulin: 5.2 % (ref 4.7–7.2)
Gamma Globulin: 18.6 % (ref 11.1–18.8)
TOTAL PROTEIN, SERUM ELECTROPHOR: 7 g/dL (ref 6.0–8.3)

## 2014-08-01 LAB — KAPPA/LAMBDA LIGHT CHAINS
Kappa free light chain: 2.07 mg/dL — ABNORMAL HIGH (ref 0.33–1.94)
Kappa:Lambda Ratio: 0.88 (ref 0.26–1.65)
LAMBDA FREE LGHT CHN: 2.36 mg/dL (ref 0.57–2.63)

## 2014-08-01 LAB — IGG, IGA, IGM
IGA: 207 mg/dL (ref 69–380)
IGG (IMMUNOGLOBIN G), SERUM: 1320 mg/dL (ref 690–1700)
IGM, SERUM: 73 mg/dL (ref 52–322)

## 2014-08-01 LAB — IFE INTERPRETATION

## 2014-08-01 NOTE — Progress Notes (Signed)
PATIENT: Morgan Espinoza DOB: 08/19/1950  REASON FOR VISIT: follow up HISTORY FROM: patient  HISTORY OF PRESENT ILLNESS: Morgan Espinoza is a 63 year old female with a history of multiple sclerosis. She returns today for follow-up. She is currently taking Betaseron every other day and tolerating it well. Denies any new numbness or weakness. No changes in gait or balance. Denies any falls. Denies any changes with her bowels or bladder. No changes with her vision. She goes to the gym three times a week.  No new medical history since last seen.   HISTORY 01/30/14 (CM): Morgan Espinoza, 63 year old female returns for followup.She has a history of multiple sclerosis. She has had no relapses since 2002, currently on Betaseron every other day tolerating the medication without any injection site problems. She pretreats site with ice . She is  Retired. She denies any double vision, loss of vision, sensory changes, loss of bowel or bladder control. She has intermittent urinary frequency and has a history of asthma. She has had no falls, no dizziness, no balance issues. No new neurologic complaints.     REVIEW OF SYSTEMS: Out of a complete 14 system review of symptoms, the patient complains only of the following symptoms, and all other reviewed systems are negative.  Wheezing, diarrhea, neck pain, memory loss   ALLERGIES: Allergies  Allergen Reactions  . Celebrex [Celecoxib]   . Dristan Other (See Comments)    "made my chest tight"  . Robitussin (Alcohol Free) [Guaifenesin] Other (See Comments)    "made my chest tight"  . Ropinirole Hcl Other (See Comments)    "made my chest tight"  . Tramadol Other (See Comments)    "made my chest tight"    HOME MEDICATIONS: Outpatient Prescriptions Prior to Visit  Medication Sig Dispense Refill  . albuterol (PROVENTIL HFA;VENTOLIN HFA) 108 (90 BASE) MCG/ACT inhaler Inhale 2 puffs into the lungs every 6 (six) hours as needed for wheezing or shortness of breath. As needed  only 1 Inhaler 1  . amLODipine (NORVASC) 10 MG tablet take 1 tablet by mouth once daily for hypertension 30 tablet 5  . aspirin 81 MG tablet Take 81 mg by mouth daily.    Marland Kitchen atorvastatin (LIPITOR) 10 MG tablet Take 10 mg by mouth daily.    Marland Kitchen CARAFATE 1 GM/10ML suspension take 10 milliliters (2 teaspoonfuls) by mouth every 6 hours 420 mL 5  . Cyanocobalamin 1000 MCG/ML KIT Inject as directed every 30 (thirty) days.    . diclofenac sodium (VOLTAREN) 1 % GEL Applied 2 g to the left knee 3 times a day 100 g 2  . docusate sodium (COLACE) 100 MG capsule Take 100 mg by mouth as needed for constipation.    . ferrous sulfate 325 (65 FE) MG tablet take 1 tablet by mouth once daily WITH BREAKFAST 30 tablet 11  . fluticasone (FLOVENT DISKUS) 50 MCG/BLIST diskus inhaler Inhale 1 puff into the lungs 2 (two) times daily.    . Interferon Beta-1b (BETASERON) 0.3 MG KIT injection Inject 0.25 mg into the skin every other day. 1 kit 6  . losartan (COZAAR) 50 MG tablet Take one tablet daily 30 tablet 11  . metoprolol succinate (TOPROL-XL) 25 MG 24 hr tablet Take 25 mg by mouth daily.     . ranitidine (ZANTAC) 150 MG tablet Take one tablet twice a day 60 tablet 11  . omeprazole (PRILOSEC) 20 MG capsule take 1 capsule by mouth once daily (Patient not taking: Reported on 07/27/2014) 30  capsule 5   Facility-Administered Medications Prior to Visit  Medication Dose Route Frequency Provider Last Rate Last Dose  . 0.9 %  sodium chloride infusion   Intravenous Continuous [name], MD   Stopped at 11/30/13 1216    PAST MEDICAL HISTORY: Past Medical History  Diagnosis Date  . Hypertension   . GERD (gastroesophageal reflux disease)   . Asthma   . Multiple sclerosis   . Hyperlipidemia   . Vertigo   . Vitamin D deficiency   . Obesity   . Achilles tendonitis   . OA (osteoarthritis) of knee   . Anemia, iron deficiency 07/23/2012  . 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 Cancer 05/2012    plasma cell  myeloma  . S/P radiation therapy 08/12/12 - 09/21/12    left Anterior 2nd Rib Lesion / 50.4 GY / 28 Fractions    PAST SURGICAL HISTORY: Past Surgical History  Procedure Laterality Date  . Cholecystectomy      lap choley  . Abdominal hysterectomy      fibroids  . Breast surgery      left breast biopsy-benign  . Rotator cuff repair    . Bone biopsy  05/17/12    Left 2nd Rib, Plasma Cell Myeloma    FAMILY HISTORY: Family History  Problem Relation Age of Onset  . Dementia Mother   . Hypertension Mother   . Cancer Father     lung; +tobacco  . Diabetes Sister   . Cancer Brother   . Diabetes Sister     SOCIAL HISTORY: History   Social History  . Marital Status: Widowed    Spouse Name: N/A    Number of Children: 1  . Years of Education: 12   Occupational History  .    Marland Kitchen Retired     Social History Main Topics  . Smoking status: Former Smoker -- 0.10 packs/day for 2 years    Types: Cigarettes    Start date: 10/05/1976    Quit date: 04/19/1979  . Smokeless tobacco: Never Used     Comment: quit 35 years ago   . Alcohol Use: No  . Drug Use: No  . Sexual Activity: No   Other Topics Concern  . 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    Patient is retired.    Patient is widowed.    Patient is right handed.    Patient has a high school education.    Patient has 1 child.       PHYSICAL EXAM  Filed Vitals:   08/01/14 0953  BP: 106/67  Pulse: 67  Height: _0  (1.575 m)  Weight: 209 lb 6.4 oz (94.983 kg)   Body mass index is 38.29 kg/(m^2).  Generalized: Well developed, in no acute distress   Neurological examination  Mentation: Alert oriented to time, place, history taking. Follows all commands speech and language fluent Cranial nerve II-XII: Pupils were equal round reactive to light. Extraocular movements were full, visual field were full on confrontational test. Facial sensation and  strength were normal. Uvula tongue midline. Head turning and shoulder shrug  were normal and symmetric. Motor: The motor testing reveals 5 over 5 strength of all 4 extremities. Good symmetric motor tone is noted throughout.  Sensory: Sensory testing is intact to soft touch on all 4 extremities. No evidence of extinction is noted.  Coordination:  Cerebellar testing reveals good finger-nose-finger and heel-to-shin bilaterally.  Gait and station: Gait is normal. Tandem gait is normal. Romberg is negative. No drift is seen.  Reflexes: Deep tendon reflexes are symmetric and normal bilaterally.    DIAGNOSTIC DATA (LABS, IMAGING, TESTING) - I reviewed patient records, labs, notes, testing and imaging myself where available.  Lab Results  Component Value Date   WBC 4.7 07/27/2014   HGB 11.9 07/27/2014   HCT 36.0 07/27/2014   MCV 98 07/27/2014   PLT 181 07/27/2014      Component Value Date/Time   NA 137 07/27/2014 1031   NA 141 05/08/2014 2036   NA 140 01/28/2013 1421   K 3.9 07/27/2014 1031   K 4.3 05/08/2014 2036   CL 104 07/27/2014 1031   CL 105 05/08/2014 2036   CO2 27 07/27/2014 1031   CO2 29 05/08/2014 2036   GLUCOSE 77 07/27/2014 1031   GLUCOSE 92 05/08/2014 2036   GLUCOSE 92 01/28/2013 1421   BUN 23* 07/27/2014 1031   BUN 15 05/08/2014 2036   BUN 20 01/28/2013 1421   CREATININE 0.8 07/27/2014 1031   CREATININE 0.70 11/30/2013 0908   CALCIUM 8.9 07/27/2014 1031   CALCIUM 8.8 05/08/2014 2036   PROT 6.9 07/27/2014 1031   PROT 6.8 05/08/2014 2036   PROT 6.9 01/28/2013 1421   ALBUMIN 4.0 05/08/2014 2036   AST 34 07/27/2014 1031   AST 24 05/08/2014 2036   ALT 27 07/27/2014 1031   ALT 21 05/08/2014 2036   ALKPHOS 60 07/27/2014 1031   ALKPHOS 67 05/08/2014 2036   BILITOT 0.70 07/27/2014 1031   BILITOT 0.4 05/08/2014 2036   GFRNONAA >90 02/23/2013 1237   GFRAA >90 02/23/2013 1237     Lab Results  Component Value Date   VITAMINB12 464 02/17/2013   Lab Results    Component Value Date   TSH 0.771 02/17/2013      ASSESSMENT AND PLAN 63 y.o. 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 here with:  1. Multiple sclerosis  Overall the patient is doing well. She denies any new symptoms. She will continue the Betaseron. She recently had blood work that was unremarkable. In the future we may consider repeating an MRI of the brain. If the patient's symptoms worsen or she develops new symptoms she should let us know. She will follow-up in 6 months.     Ward Givens, MSN, NP-C 08/01/2014, 10:15 AM [facility] [address], [place], [place] [address] [phone]  Note: This document was prepared with digital dictation and possible smart phrase technology. Any transcriptional errors that result from this process are unintentional.

## 2014-08-01 NOTE — Patient Instructions (Signed)
Continue Betaseron.  In the future we will consider repeating an MRI of the brain. If you develop new symptoms please let us know.

## 2014-08-02 ENCOUNTER — Other Ambulatory Visit: Payer: Self-pay | Admitting: Emergency Medicine

## 2014-08-09 ENCOUNTER — Other Ambulatory Visit: Payer: Self-pay

## 2014-08-09 DIAGNOSIS — Z1231 Encounter for screening mammogram for malignant neoplasm of breast: Secondary | ICD-10-CM

## 2014-08-09 NOTE — Progress Notes (Signed)
I agree above plan. 

## 2014-08-12 ENCOUNTER — Ambulatory Visit (INDEPENDENT_AMBULATORY_CARE_PROVIDER_SITE_OTHER): Payer: BC Managed Care – PPO | Admitting: Family Medicine

## 2014-08-12 VITALS — BP 144/76 | HR 70 | Temp 98.0°F | Resp 16 | Ht 61.5 in | Wt 208.0 lb

## 2014-08-12 DIAGNOSIS — I10 Essential (primary) hypertension: Secondary | ICD-10-CM

## 2014-08-12 DIAGNOSIS — E538 Deficiency of other specified B group vitamins: Secondary | ICD-10-CM

## 2014-08-12 DIAGNOSIS — L299 Pruritus, unspecified: Secondary | ICD-10-CM

## 2014-08-12 DIAGNOSIS — E785 Hyperlipidemia, unspecified: Secondary | ICD-10-CM

## 2014-08-12 DIAGNOSIS — D696 Thrombocytopenia, unspecified: Secondary | ICD-10-CM

## 2014-08-12 DIAGNOSIS — R197 Diarrhea, unspecified: Secondary | ICD-10-CM

## 2014-08-12 LAB — CBC WITH DIFFERENTIAL/PLATELET
Basophils Absolute: 0 10*3/uL (ref 0.0–0.1)
Basophils Relative: 0 % (ref 0–1)
EOS ABS: 0.5 10*3/uL (ref 0.0–0.7)
Eosinophils Relative: 11 % — ABNORMAL HIGH (ref 0–5)
HEMATOCRIT: 33.9 % — AB (ref 36.0–46.0)
HEMOGLOBIN: 11.1 g/dL — AB (ref 12.0–15.0)
LYMPHS ABS: 1.4 10*3/uL (ref 0.7–4.0)
Lymphocytes Relative: 28 % (ref 12–46)
MCH: 30.9 pg (ref 26.0–34.0)
MCHC: 32.7 g/dL (ref 30.0–36.0)
MCV: 94.4 fL (ref 78.0–100.0)
MONOS PCT: 8 % (ref 3–12)
MPV: 11.9 fL (ref 8.6–12.4)
Monocytes Absolute: 0.4 10*3/uL (ref 0.1–1.0)
Neutro Abs: 2.6 10*3/uL (ref 1.7–7.7)
Neutrophils Relative %: 53 % (ref 43–77)
Platelets: 142 10*3/uL — ABNORMAL LOW (ref 150–400)
RBC: 3.59 MIL/uL — ABNORMAL LOW (ref 3.87–5.11)
RDW: 12.5 % (ref 11.5–15.5)
WBC: 4.9 10*3/uL (ref 4.0–10.5)

## 2014-08-12 LAB — POCT CBC
Granulocyte percent: 58.4 %G (ref 37–80)
HCT, POC: 33.7 % — AB (ref 37.7–47.9)
HEMOGLOBIN: 11 g/dL — AB (ref 12.2–16.2)
Lymph, poc: 2.6 (ref 0.6–3.4)
MCH, POC: 31.8 pg — AB (ref 27–31.2)
MCHC: 32.7 g/dL (ref 31.8–35.4)
MCV: 97.4 fL — AB (ref 80–97)
MID (CBC): 0.1 (ref 0–0.9)
MPV: 8.2 fL (ref 0–99.8)
POC GRANULOCYTE: 3.9 (ref 2–6.9)
POC LYMPH PERCENT: 39.4 %L (ref 10–50)
POC MID %: 2.2 %M (ref 0–12)
Platelet Count, POC: 87 10*3/uL — AB (ref 142–424)
RBC: 3.46 M/uL — AB (ref 4.04–5.48)
RDW, POC: 13.6 %
WBC: 6.7 10*3/uL (ref 4.6–10.2)

## 2014-08-12 LAB — COMPREHENSIVE METABOLIC PANEL
ALK PHOS: 65 U/L (ref 39–117)
ALT: 20 U/L (ref 0–35)
AST: 24 U/L (ref 0–37)
Albumin: 3.7 g/dL (ref 3.5–5.2)
BUN: 17 mg/dL (ref 6–23)
CALCIUM: 8.8 mg/dL (ref 8.4–10.5)
CHLORIDE: 104 meq/L (ref 96–112)
CO2: 26 mEq/L (ref 19–32)
CREATININE: 1.24 mg/dL — AB (ref 0.50–1.10)
GLUCOSE: 82 mg/dL (ref 70–99)
Potassium: 4.1 mEq/L (ref 3.5–5.3)
Sodium: 140 mEq/L (ref 135–145)
Total Bilirubin: 0.4 mg/dL (ref 0.2–1.2)
Total Protein: 6.9 g/dL (ref 6.0–8.3)

## 2014-08-12 LAB — LIPID PANEL
CHOL/HDL RATIO: 2.6 ratio
CHOLESTEROL: 178 mg/dL (ref 0–200)
HDL: 69 mg/dL (ref >39)
LDL Cholesterol: 96 mg/dL (ref 0–99)
Triglycerides: 67 mg/dL (ref <150)
VLDL: 13 mg/dL (ref 0–40)

## 2014-08-12 LAB — POCT URINALYSIS DIPSTICK
Bilirubin, UA: NEGATIVE
Glucose, UA: NEGATIVE
Leukocytes, UA: NEGATIVE
NITRITE UA: NEGATIVE
PROTEIN UA: NEGATIVE
RBC UA: NEGATIVE
Spec Grav, UA: 1.02
UROBILINOGEN UA: 0.2
pH, UA: 7

## 2014-08-12 LAB — VITAMIN B12: Vitamin B-12: 520 pg/mL (ref 211–911)

## 2014-08-12 MED ORDER — AMLODIPINE BESYLATE 10 MG PO TABS: 10.0000 mg | ORAL_TABLET | Freq: Every day | ORAL | Status: DC

## 2014-08-12 MED ORDER — CYANOCOBALAMIN 1000 MCG/ML IJ SOLN
1000.0000 ug | Freq: Once | INTRAMUSCULAR | Status: AC
  Administered 2014-08-12: 1000 ug via INTRAMUSCULAR

## 2014-08-12 NOTE — Progress Notes (Addendum)
Subjective:  This chart was scribed for Morgan Forts, MD by [name], ED Scribe at [facility].The patient was seen in exam room 11 and the patient's care was started at 11:25 AM.   Patient ID: Morgan Espinoza, female    DOB: 11/03/1950, 64 y.o.   MRN: [id number]  08/12/2014  Rash; Medication Refill; and B12 Injection  HPI  HPI Comments: Morgan Espinoza is a 64 y.o. female who presents to [facility] complaining of rash above her right breast onset one day ago. Pt reports the rash has gradually improved since onset. She has itching associated with her rash. She has used alcohol for relief. Has not had recent changes in lotion, detergent or body wash. Pt reports no changes in her MS medication or any other changes in medication. She has no rash on her back. She does not currently have an itch or rash.  Pt also needs a refill on her amlodipine. Pt has been taking her metoprolol and her lipitor and losartan. Pt does not check her BP at home but does at her doctor visits. She denies CP, SOB and leg swelling.  Pt complains of diarrhea onset 1 week ago. She typically has diarrhea after breakfast every morning. Her appetite has been normal. Pt ate left over chicken, a fruit cup and water for breakfast today. She normally has eggs, [place] or chicken for breakfast. She denies blood or mucous in stool, nausea, vomiting, fever, chills, sweats, abdominal pain, or indigestion. Colonoscopy in past.  She also complains of intermittent left neck pain, the pain comes for one second then goes away. She denies HA.  Dr. Everlene Farrier is her PCP and she sees him about every month for her B12 injection. She has had a colonoscopy.  She is due for monthly B12 injection.  Review of Systems  Constitutional: Negative for fever, chills, diaphoresis and fatigue.  Respiratory: Negative for shortness of breath.   Cardiovascular: Negative for chest pain, palpitations and leg swelling.  Gastrointestinal: Positive for  diarrhea. Negative for nausea, vomiting, abdominal pain, constipation, blood in stool, abdominal distention, anal bleeding and rectal pain.  Musculoskeletal: Positive for neck pain.  Skin: Positive for rash.  Neurological: Negative for headaches.    Past Medical History  Diagnosis Date  . Hypertension   . GERD (gastroesophageal reflux disease)   . Asthma   . Multiple sclerosis   . Hyperlipidemia   . Vertigo   . Vitamin D deficiency   . Obesity   . Achilles tendonitis   . OA (osteoarthritis) of knee   . Anemia, iron deficiency 07/23/2012  . 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 Cancer 05/2012    plasma cell myeloma  . S/P radiation therapy 08/12/12 - 09/21/12    left Anterior 2nd Rib Lesion / 50.4 GY / 28 Fractions   Past Surgical History  Procedure Laterality Date  . Cholecystectomy      lap choley  . Abdominal hysterectomy      fibroids  . Breast surgery      left breast biopsy-benign  . Rotator cuff repair    . Bone biopsy  05/17/12    Left 2nd Rib, Plasma Cell Myeloma   Allergies  Allergen Reactions  . Celebrex [Celecoxib]   . Dristan Other (See Comments)    "made my chest tight"  . Robitussin (Alcohol Free) [Guaifenesin] Other (See Comments)    "made my chest tight"  . Ropinirole Hcl Other (See Comments)    "  made my chest tight"  . Tramadol Other (See Comments)    "made my chest tight"   Current Outpatient Prescriptions  Medication Sig Dispense Refill  . albuterol (PROVENTIL HFA;VENTOLIN HFA) 108 (90 BASE) MCG/ACT inhaler Inhale 2 puffs into the lungs every 6 (six) hours as needed for wheezing or shortness of breath. As needed only 1 Inhaler 1  . amLODipine (NORVASC) 10 MG tablet Take 1 tablet (10 mg total) by mouth daily. PATIENT NEEDS BLOOD PRESSURE CHECK UP FOR ADDITIONAL REFILLS 30 tablet 11  . aspirin 81 MG tablet Take 81 mg by mouth daily.    Marland Kitchen atorvastatin (LIPITOR) 10 MG tablet Take 10 mg by mouth daily.    Marland Kitchen CARAFATE 1 GM/10ML suspension take  10 milliliters (2 teaspoonfuls) by mouth every 6 hours 420 mL 5  . Cyanocobalamin 1000 MCG/ML KIT Inject as directed every 30 (thirty) days.    . diclofenac sodium (VOLTAREN) 1 % GEL Applied 2 g to the left knee 3 times a day 100 g 2  . docusate sodium (COLACE) 100 MG capsule Take 100 mg by mouth as needed for constipation.    . ferrous sulfate 325 (65 FE) MG tablet take 1 tablet by mouth once daily WITH BREAKFAST 30 tablet 11  . fluticasone (FLOVENT DISKUS) 50 MCG/BLIST diskus inhaler Inhale 1 puff into the lungs 2 (two) times daily.    . Interferon Beta-1b (BETASERON) 0.3 MG KIT injection Inject 0.25 mg into the skin every other day. 1 kit 6  . losartan (COZAAR) 50 MG tablet Take one tablet daily 30 tablet 11  . metoprolol succinate (TOPROL-XL) 25 MG 24 hr tablet Take 25 mg by mouth daily.     Marland Kitchen omeprazole (PRILOSEC) 20 MG capsule take 1 capsule by mouth once daily 30 capsule 5  . ranitidine (ZANTAC) 150 MG tablet Take one tablet twice a day 60 tablet 11   No current facility-administered medications for this visit.   Facility-Administered Medications Ordered in Other Visits  Medication Dose Route Frequency Provider Last Rate Last Dose  . 0.9 %  sodium chloride infusion   Intravenous Continuous [name], MD   Stopped at 11/30/13 1216       Objective:    BP 144/76 mmHg  Pulse 70  Temp(Src) 98 F (36.7 C)  Resp 16  Ht 5' 1.5" (1.562 m)  Wt 208 lb (94.348 kg)  BMI 38.67 kg/m2  SpO2 98% Physical Exam  Constitutional: She is oriented to person, place, and time. She appears well-developed and well-nourished. No distress.  HENT:  Head: Normocephalic and atraumatic.  Right Ear: External ear normal.  Left Ear: External ear normal.  Nose: Nose normal.  Mouth/Throat: Oropharynx is clear and moist.  Eyes: Conjunctivae and EOM are normal. Pupils are equal, round, and reactive to light.  Neck: Normal range of motion. Neck supple. Carotid bruit is not present. No thyromegaly  present.  Cardiovascular: Normal rate, regular rhythm, normal heart sounds and intact distal pulses.  Exam reveals no gallop and no friction rub.   No murmur heard. Pulmonary/Chest: Effort normal and breath sounds normal. She has no wheezes. She has no rales.  Lungs are clear to ascultation.  Abdominal: Soft. Bowel sounds are normal. She exhibits no distension and no mass. There is tenderness in the epigastric area. There is no rebound and no guarding.  TTP at the epigastric region.  Musculoskeletal:  Tracing non pitting edema bilaterally.  Lymphadenopathy:    She has no cervical adenopathy.  Neurological:  She is alert and oriented to person, place, and time. No cranial nerve deficit. She exhibits normal muscle tone. Coordination normal.  Skin: Skin is warm and dry. No rash noted. She is not diaphoretic. No erythema. No pallor.  No rash along the anterior chest.  Psychiatric: She has a normal mood and affect. Her behavior is normal.  Nursing note and vitals reviewed.  Results for orders placed or performed in visit on 08/12/14  POCT CBC  Result Value Ref Range   WBC 6.7 4.6 - 10.2 K/uL   Lymph, poc 2.6 0.6 - 3.4   POC LYMPH PERCENT 39.4 10 - 50 %L   MID (cbc) 0.1 0 - 0.9   POC MID % 2.2 0 - 12 %M   POC Granulocyte 3.9 2 - 6.9   Granulocyte percent 58.4 37 - 80 %G   RBC 3.46 (A) 4.04 - 5.48 M/uL   Hemoglobin 11.0 (A) 12.2 - 16.2 g/dL   HCT, POC 33.7 (A) 37.7 - 47.9 %   MCV 97.4 (A) 80 - 97 fL   MCH, POC 31.8 (A) 27 - 31.2 pg   MCHC 32.7 31.8 - 35.4 g/dL   RDW, POC 13.6 %   Platelet Count, POC 87 (A) 142 - 424 K/uL   MPV 8.2 0 - 99.8 fL  POCT urinalysis dipstick  Result Value Ref Range   Color, UA yellow    Clarity, UA clear    Glucose, UA neg    Bilirubin, UA neg    Ketones, UA trace    Spec Grav, UA 1.020    Blood, UA neg    pH, UA 7.0    Protein, UA neg    Urobilinogen, UA 0.2    Nitrite, UA neg    Leukocytes, UA Negative    VITAMIN B12 INJECTION ADMINISTERED BY  DARON.    Assessment & Plan:   1. Essential hypertension, benign   2. Hyperlipidemia   3. Diarrhea   4. Pruritic dermatitis   5. Vitamin B12 deficiency   6. Thrombocytopenia      1. HTN:  Controlled; obtain labs; refill of Amlodipine provided. 2.  Hyperlipidemia: stable; obtain labs; continue Lipitor. 3. Diarrhea: New. One diarrheal stool every morning; benign abdominal exam; recommend BRAT diet, hydration. If worsens or persists, RTC. 4.  Pruritic dermatitis: New. R anterior chest; no rash associated with itching this morning; recommend OTC hydrocortisone bid to tid. 5.  Vitamin B12 deficiency: stable; s/p B12 injection in office.  Obtain labs. 6. Thrombocytopenia:  New. Send out CBC ordered for confirmation.  No sites of bleeding.  No melena or bloody stools.    Meds ordered this encounter  Medications  . cyanocobalamin ((VITAMIN B-12)) injection 1,000 mcg    Sig:   . amLODipine (NORVASC) 10 MG tablet    Sig: Take 1 tablet (10 mg total) by mouth daily. PATIENT NEEDS BLOOD PRESSURE CHECK UP FOR ADDITIONAL REFILLS    Dispense:  30 tablet    Refill:  11    Return in about 4 weeks (around 09/09/2014) for recheck.   I personally performed the services described in this documentation, which was scribed in my presence. The recorded information has been reviewed and considered.   Dmarcus Decicco Elayne Guerin, M.D. [facility] [address] [place], [place]  [address] [phone] phone [phone] fax

## 2014-08-18 NOTE — Progress Notes (Signed)
I agree above plan. 

## 2014-09-05 NOTE — Progress Notes (Unsigned)
PA started for pt, Volatern Gel 1%

## 2014-09-05 NOTE — Progress Notes (Unsigned)
   Subjective:    Patient ID: Morgan Espinoza, female    DOB: Apr 27, 1951, 64 y.o.   MRN: [id number]  HPI    Review of Systems     Objective:   Physical Exam        Assessment & Plan:

## 2014-09-06 ENCOUNTER — Other Ambulatory Visit: Payer: Self-pay | Admitting: Emergency Medicine

## 2014-09-06 NOTE — Progress Notes (Unsigned)
   Subjective:    Patient ID: Morgan Espinoza, female    DOB: August 26, 1950, 64 y.o.   MRN: [id number]  HPI    Review of Systems     Objective:   Physical Exam        Assessment & Plan:  PA approved through 09/05/15. Notified pharm.

## 2014-09-12 ENCOUNTER — Other Ambulatory Visit: Payer: Self-pay | Admitting: *Deleted

## 2014-09-12 MED ORDER — INTERFERON BETA-1B 0.3 MG ~~LOC~~ KIT: 0.2500 mg | PACK | SUBCUTANEOUS | Status: DC

## 2014-09-12 NOTE — Telephone Encounter (Signed)
Pharmacy called on behalf of patient.

## 2014-09-12 NOTE — Telephone Encounter (Signed)
Rx has been sent  

## 2014-09-16 ENCOUNTER — Ambulatory Visit (INDEPENDENT_AMBULATORY_CARE_PROVIDER_SITE_OTHER): Payer: BC Managed Care – PPO | Admitting: Internal Medicine

## 2014-09-16 VITALS — BP 142/80 | HR 59 | Temp 98.0°F | Resp 18 | Ht 62.0 in | Wt 207.6 lb

## 2014-09-16 DIAGNOSIS — I10 Essential (primary) hypertension: Secondary | ICD-10-CM

## 2014-09-16 DIAGNOSIS — E538 Deficiency of other specified B group vitamins: Secondary | ICD-10-CM

## 2014-09-16 MED ORDER — CYANOCOBALAMIN 1000 MCG/ML IJ SOLN
1000.0000 ug | Freq: Once | INTRAMUSCULAR | Status: AC
  Administered 2014-09-16: 1000 ug via INTRAMUSCULAR

## 2014-09-16 MED ORDER — AMLODIPINE BESYLATE 10 MG PO TABS: 10.0000 mg | ORAL_TABLET | Freq: Every day | ORAL | Status: DC

## 2014-09-16 NOTE — Patient Instructions (Signed)
BP Readings from Last 3 Encounters:  09/16/14 142/80  08/12/14 144/76  08/01/14 106/67

## 2014-09-16 NOTE — Progress Notes (Signed)
Subjective:  This chart was scribed for Morgan Lin, MD by Morgan Espinoza, ED Scribe at [facility].The patient was seen in exam room 04 and the patient's care was started at 10:46 AM.   Patient ID: Morgan Espinoza, female    DOB: 1951-04-02, 64 y.o.   MRN: [id number] Chief Complaint  Patient presents with  . Medication Refill    amlodapine  . Shot    Vitamin B12   HPI HPI Comments: Morgan Espinoza is a 64 y.o. female with a history of asthma who presents to [facility] for a refill for her amlodipine and a Vitamin B12 shot. Metoprolol last filled 08/08/2014. Losartan last refilled 09/08/2014. Pt is doing well, no abdominal pain. She has never had shingles.  Immunization History  Administered Date(s) Administered  . Influenza Split 04/18/2012, 04/19/2014  . Influenza,inj,Quad PF,36+ Mos 05/06/2013    Patient Active Problem List   Diagnosis Date Noted  . Encounter for long-term (current) use of other medications 01/28/2013  . 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 Multiple sclerosis   . Anemia   . Hyperlipidemia   . Vertigo   . Vitamin D deficiency   . Obesity   . Achilles tendonitis   . OA (osteoarthritis) of knee   . Anemia, iron deficiency 07/23/2012  . Myeloma 07/12/2012  . 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 Cancer 05/11/2012  . Rib lesion 04/28/2012  . Chest pain 06/05/2011  . Hypertension 06/05/2011  . Asthma 06/05/2011  . GERD (gastroesophageal reflux disease) 06/05/2011  . MS (multiple sclerosis) 06/05/2011   Past Medical History  Diagnosis Date  . Hypertension   . GERD (gastroesophageal reflux disease)   . Asthma   . Multiple sclerosis   . Hyperlipidemia   . Vertigo   . Vitamin D deficiency   . Obesity   . Achilles tendonitis   . OA (osteoarthritis) of knee   . Anemia, iron deficiency 07/23/2012  . 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 Cancer 05/2012    plasma cell myeloma  . S/P radiation therapy 08/12/12 - 09/21/12    left Anterior 2nd Rib  Lesion / 50.4 GY / 28 Fractions   Allergies  Allergen Reactions  . Celebrex [Celecoxib]   . Dristan Other (See Comments)    "made my chest tight"  . Robitussin (Alcohol Free) [Guaifenesin] Other (See Comments)    "made my chest tight"  . Ropinirole Hcl Other (See Comments)    "made my chest tight"  . Tramadol Other (See Comments)    "made my chest tight"   Prior to Admission medications   Medication Sig Start Date End Date Taking? Authorizing Provider  albuterol (PROVENTIL HFA;VENTOLIN HFA) 108 (90 BASE) MCG/ACT inhaler Inhale 2 puffs into the lungs every 6 (six) hours as needed for wheezing or shortness of breath. As needed only 12/10/13  Yes Darlyne Russian, MD  amLODipine (NORVASC) 10 MG tablet Take 1 tablet (10 mg total) by mouth daily. PATIENT NEEDS BLOOD PRESSURE CHECK UP FOR ADDITIONAL REFILLS 08/12/14  Yes Wardell Honour, MD  aspirin 81 MG tablet Take 81 mg by mouth daily.   Yes Historical Provider, MD  atorvastatin (LIPITOR) 10 MG tablet Take 10 mg by mouth daily.   Yes Historical Provider, MD  CARAFATE 1 GM/10ML suspension take 10 milliliters (2 teaspoonfuls) by mouth every 6 hours 07/08/14  Yes Darlyne Russian, MD  Cyanocobalamin 1000 MCG/ML KIT Inject as directed every 30 (thirty) days.  Yes Historical Provider, MD  docusate sodium (COLACE) 100 MG capsule Take 100 mg by mouth as needed for constipation.   Yes Historical Provider, MD  ferrous sulfate 325 (65 FE) MG tablet take 1 tablet by mouth once daily WITH BREAKFAST 09/16/13  Yes Darlyne Russian, MD  Interferon Beta-1b (BETASERON) 0.3 MG KIT injection Inject 0.25 mg into the skin every other day. 09/12/14  Yes Marcial Pacas, MD  losartan (COZAAR) 50 MG tablet Take one tablet daily 09/29/13  Yes Darlyne Russian, MD  metoprolol succinate (TOPROL-XL) 25 MG 24 hr tablet Take 25 mg by mouth daily.  04/04/13  Yes Historical Provider, MD  omeprazole (PRILOSEC) 20 MG capsule take 1 capsule by mouth once daily 05/16/14  Yes Darlyne Russian, MD  ranitidine  (ZANTAC) 150 MG tablet Take one tablet twice a day 09/29/13  Yes Darlyne Russian, MD  VOLTAREN 1 % GEL apply 2 grams to THE LEFT KNEE three times a day 09/07/14  Yes Darlyne Russian, MD  fluticasone (FLOVENT DISKUS) 50 MCG/BLIST diskus inhaler Inhale 1 puff into the lungs 2 (two) times daily.    Historical Provider, MD   Review of Systems  Gastrointestinal: Negative for abdominal pain.       Objective:  BP 142/80 mmHg  Pulse 59  Temp(Src) 98 F (36.7 C) (Oral)  Resp 18  Ht 5' 2" (1.575 m)  Wt 207 lb 9.6 oz (94.167 kg)  BMI 37.96 kg/m2  SpO2 96%  Physical Exam  Constitutional: She is oriented to person, place, and time. She appears well-developed and well-nourished. No distress.  HENT:  Head: Normocephalic and atraumatic.  Eyes: Pupils are equal, round, and reactive to light.  Neck: Normal range of motion.  Cardiovascular: Normal rate and regular rhythm.   Pulmonary/Chest: Effort normal. No respiratory distress.  Musculoskeletal: Normal range of motion.  Neurological: She is alert and oriented to person, place, and time.  Skin: Skin is warm and dry.  Psychiatric: She has a normal mood and affect. Her behavior is normal.  Nursing note and vitals reviewed.      Assessment & Plan:  Essential hypertension, benign  B12 deficiency -   Meds ordered this encounter  Medications  . amLODipine (NORVASC) 10 MG tablet    Sig: Take 1 tablet (10 mg total) by mouth daily.    Dispense:  90 tablet    Refill:  3  . cyanocobalamin ((VITAMIN B-12)) injection 1,000 mcg    Sig:       I have completed the patient encounter in its entirety as documented by the scribe, with editing by me where necessary. Bekim Werntz P. Laney Pastor, M.D.

## 2014-09-19 ENCOUNTER — Other Ambulatory Visit: Payer: Self-pay | Admitting: Emergency Medicine

## 2014-09-24 ENCOUNTER — Other Ambulatory Visit: Payer: Self-pay | Admitting: Emergency Medicine

## 2014-09-28 ENCOUNTER — Other Ambulatory Visit: Payer: Self-pay

## 2014-09-28 MED ORDER — LOSARTAN POTASSIUM 50 MG PO TABS: 50.0000 mg | ORAL_TABLET | Freq: Every day | ORAL | Status: DC

## 2014-10-12 ENCOUNTER — Ambulatory Visit (INDEPENDENT_AMBULATORY_CARE_PROVIDER_SITE_OTHER): Payer: BC Managed Care – PPO | Admitting: Sports Medicine

## 2014-10-12 VITALS — BP 140/80 | HR 63 | Temp 97.9°F | Resp 16 | Ht 62.0 in | Wt 209.0 lb

## 2014-10-12 DIAGNOSIS — G35 Multiple sclerosis: Secondary | ICD-10-CM | POA: Diagnosis not present

## 2014-10-12 DIAGNOSIS — C9030 Solitary plasmacytoma not having achieved remission: Secondary | ICD-10-CM

## 2014-10-12 DIAGNOSIS — M170 Bilateral primary osteoarthritis of knee: Secondary | ICD-10-CM

## 2014-10-12 DIAGNOSIS — C9000 Multiple myeloma not having achieved remission: Secondary | ICD-10-CM

## 2014-10-12 DIAGNOSIS — E538 Deficiency of other specified B group vitamins: Secondary | ICD-10-CM

## 2014-10-12 MED ORDER — CYANOCOBALAMIN 1000 MCG/ML IJ SOLN
1000.0000 ug | Freq: Once | INTRAMUSCULAR | Status: AC
  Administered 2014-10-12: 1000 ug via INTRAMUSCULAR

## 2014-10-12 NOTE — Patient Instructions (Signed)
Follow up as needed

## 2014-10-12 NOTE — Assessment & Plan Note (Signed)
1000 g 1

## 2014-10-12 NOTE — Progress Notes (Signed)
Morgan Espinoza - 64 y.o. female MRN [id number]  Date of birth: [birth year]  SUBJECTIVE: CC:  Chief Complaint  Patient presents with  . Injections    Vit B 12  . Knee Pain    bilateral   HPI Comments: Patient presents with: Injections: Vit B 12 - Receives B12 injections on regular basis for bilateral hand and LE cramping.  Responds well typically.   Normal cramping in hands today.   No chest pain, numbness tingling or unilateral weakness.   Not consistent with sx of MS  Knee Pain: bilateral  - Long standing intermittent B generalized knee pain, swelling discomfort.  - Is persisting longer than normal - No fevers, chills, night sweats/awakenings due to pain - R Knee > pain than left but left seems stiffer - + Theater sign   ROS: Negative for fever, chills, night sweats or recent weight changes.   Past Medical History:   Hypertension                                                 GERD (gastroesophageal reflux disease)                       Asthma                                                       Multiple sclerosis                                           Hyperlipidemia                                               Vertigo                                                      Vitamin D deficiency                                         Obesity                                                      Achilles tendonitis                                          OA (osteoarthritis) of knee  Anemia, iron deficiency                         07/23/2012   Plasma cell myeloma                             05/17/12        Comment:left 2nd Rib   Cancer                                          05/2012        Comment:plasma cell myeloma   S/P radiation therapy                           08/12/12 - *     Comment:left Anterior 2nd Rib Lesion / 50.4 GY / 28               Fractions   Past Surgical History:   CHOLECYSTECTOMY                                                  Comment:lap choley   ABDOMINAL HYSTERECTOMY                                          Comment:fibroids   BREAST SURGERY                                                  Comment:left breast biopsy-benign   ROTATOR CUFF REPAIR                                           BONE BIOPSY                                      05/17/12        Comment:Left 2nd Rib, Plasma Cell Myeloma  Social History Narrative   Retired: Bus Driver for 28 years   Patient lives at home alone.    Patient is retired.    Patient is widowed.    Patient is right handed.    Patient has a high school education.    Patient has 1 child.   Former Smoker - 0.10 Packs/Day - For 2.00 Years   OBJECTIVE:  VS:   HT:5\' 2"  (157.5 cm)   WT:209 lb (94.802 kg)  BMI:38.3          BP:140/80 mmHg  HR:63bpm  TEMP:97.9 F (36.6 C)(Oral)  RESP:98 %  PHYSICAL EXAM:  Physical Exam  Constitutional: She is oriented to person, place, and time. She appears well-developed and well-nourished. No distress.  HENT:  Head: Normocephalic and atraumatic.  Eyes: EOM are normal. Right eye exhibits no discharge. Left eye exhibits no  discharge.  Neck: No JVD present. No tracheal deviation present.  Cardiovascular: Normal rate.   Pulmonary/Chest: Effort normal and breath sounds normal.  Abdominal: Soft.  Musculoskeletal:       Right knee: She exhibits swelling. She exhibits normal range of motion, no effusion, normal alignment, no LCL laxity, normal meniscus and no MCL laxity. No medial joint line and no lateral joint line tenderness noted.       Left knee: She exhibits decreased range of motion, swelling and effusion. She exhibits no deformity, no erythema, normal alignment, no LCL laxity, normal patellar mobility, no bony tenderness, normal meniscus and no MCL laxity. No medial joint line and no lateral joint line tenderness noted.  Neurological: She is alert and oriented to person, place, and time. She has normal reflexes. She displays normal  reflexes. She exhibits normal muscle tone.  Skin: Skin is warm and dry. She is not diaphoretic.  Vitals reviewed.  ASSESSMENT & PLAN: See problem oriented documentation as below  Return if symptoms worsen or fail to improve.

## 2014-10-12 NOTE — Assessment & Plan Note (Signed)
Long-standing issues of knee pain. This is essentially her same symptoms only and has been present for greater than 2 weeks. Reports only minimal symptoms and does not wish for injection today. 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 Multiple prior bilateral knee injections for similar complaints with evidence of osteoarthritis.   Recommend compression sleeves bilateral  Continue Tylenol when necessary  Discussed aspiration and injection of bilateral knees patient reports only minimal symptoms.  Follow-up if not improving will need repeat advanced imaging given plasmacytoma history but suspect this is due to her underlying osteoarthritis especially in the setting of bilateral knee effusions

## 2014-10-26 ENCOUNTER — Telehealth: Payer: Self-pay | Admitting: Hematology & Oncology

## 2014-10-26 ENCOUNTER — Ambulatory Visit: Payer: BC Managed Care – PPO | Admitting: Family

## 2014-10-26 ENCOUNTER — Other Ambulatory Visit: Payer: BC Managed Care – PPO | Admitting: Lab

## 2014-10-26 ENCOUNTER — Ambulatory Visit: Payer: BC Managed Care – PPO

## 2014-10-26 NOTE — Telephone Encounter (Signed)
Left pt message to call and reschedule missed appointment °

## 2014-11-01 ENCOUNTER — Ambulatory Visit (HOSPITAL_BASED_OUTPATIENT_CLINIC_OR_DEPARTMENT_OTHER): Payer: BC Managed Care – PPO | Admitting: Family

## 2014-11-01 ENCOUNTER — Other Ambulatory Visit: Payer: Self-pay | Admitting: Emergency Medicine

## 2014-11-01 ENCOUNTER — Encounter: Payer: Self-pay | Admitting: Family

## 2014-11-01 ENCOUNTER — Ambulatory Visit (HOSPITAL_BASED_OUTPATIENT_CLINIC_OR_DEPARTMENT_OTHER): Payer: BC Managed Care – PPO | Admitting: Lab

## 2014-11-01 ENCOUNTER — Ambulatory Visit (HOSPITAL_BASED_OUTPATIENT_CLINIC_OR_DEPARTMENT_OTHER): Payer: BC Managed Care – PPO

## 2014-11-01 VITALS — BP 149/80 | HR 60 | Temp 98.3°F | Resp 14 | Ht 62.0 in | Wt 207.0 lb

## 2014-11-01 DIAGNOSIS — C9030 Solitary plasmacytoma not having achieved remission: Secondary | ICD-10-CM

## 2014-11-01 DIAGNOSIS — D509 Iron deficiency anemia, unspecified: Secondary | ICD-10-CM

## 2014-11-01 DIAGNOSIS — C9000 Multiple myeloma not having achieved remission: Secondary | ICD-10-CM | POA: Diagnosis not present

## 2014-11-01 LAB — CMP (CANCER CENTER ONLY)
ALT(SGPT): 29 U/L (ref 10–47)
AST: 36 U/L (ref 11–38)
Albumin: 3.4 g/dL (ref 3.3–5.5)
Alkaline Phosphatase: 77 U/L (ref 26–84)
BUN, Bld: 26 mg/dL — ABNORMAL HIGH (ref 7–22)
CALCIUM: 9.4 mg/dL (ref 8.0–10.3)
CO2: 30 meq/L (ref 18–33)
Chloride: 106 mEq/L (ref 98–108)
Creat: 0.8 mg/dl (ref 0.6–1.2)
GLUCOSE: 86 mg/dL (ref 73–118)
POTASSIUM: 3.8 meq/L (ref 3.3–4.7)
SODIUM: 145 meq/L (ref 128–145)
TOTAL PROTEIN: 7.4 g/dL (ref 6.4–8.1)
Total Bilirubin: 0.6 mg/dl (ref 0.20–1.60)

## 2014-11-01 LAB — IRON AND TIBC CHCC
%SAT: 32 % (ref 21–57)
IRON: 78 ug/dL (ref 41–142)
TIBC: 241 ug/dL (ref 236–444)
UIBC: 163 ug/dL (ref 120–384)

## 2014-11-01 LAB — CBC WITH DIFFERENTIAL (CANCER CENTER ONLY)
BASO#: 0 10*3/uL (ref 0.0–0.2)
BASO%: 0.4 % (ref 0.0–2.0)
EOS%: 10 % — AB (ref 0.0–7.0)
Eosinophils Absolute: 0.5 10*3/uL (ref 0.0–0.5)
HCT: 37.8 % (ref 34.8–46.6)
HEMOGLOBIN: 12.4 g/dL (ref 11.6–15.9)
LYMPH#: 1.5 10*3/uL (ref 0.9–3.3)
LYMPH%: 30.4 % (ref 14.0–48.0)
MCH: 31.5 pg (ref 26.0–34.0)
MCHC: 32.8 g/dL (ref 32.0–36.0)
MCV: 96 fL (ref 81–101)
MONO#: 0.3 10*3/uL (ref 0.1–0.9)
MONO%: 6.5 % (ref 0.0–13.0)
NEUT%: 52.7 % (ref 39.6–80.0)
NEUTROS ABS: 2.5 10*3/uL (ref 1.5–6.5)
PLATELETS: 106 10*3/uL — AB (ref 145–400)
RBC: 3.94 10*6/uL (ref 3.70–5.32)
RDW: 11.8 % (ref 11.1–15.7)
WBC: 4.8 10*3/uL (ref 3.9–10.0)

## 2014-11-01 LAB — FERRITIN CHCC: Ferritin: 958 ng/ml — ABNORMAL HIGH (ref 9–269)

## 2014-11-01 MED ORDER — ZOLEDRONIC ACID 4 MG/100ML IV SOLN
4.0000 mg | Freq: Once | INTRAVENOUS | Status: AC
  Administered 2014-11-01: 4 mg via INTRAVENOUS
  Filled 2014-11-01: qty 100

## 2014-11-01 MED ORDER — SODIUM CHLORIDE 0.9 % IV SOLN
Freq: Once | INTRAVENOUS | Status: AC
  Administered 2014-11-01: 11:00:00 via INTRAVENOUS

## 2014-11-01 NOTE — Patient Instructions (Signed)

## 2014-11-01 NOTE — Progress Notes (Signed)
Hematology and Oncology Follow Up Visit  Shalissa Easterwood [id number] November 26, 1950 64 y.o. 11/01/2014   Principle Diagnosis:  IgG Kappa plasmacytoma Iron deficiency anemia  Current Therapy:   Zometa 4 mg IV q. 2 months IV iron-last received Feraheme in June 2015 with a dose of 1020 mg    Interim History: Ms. Mainor is here today for a follow-up. She is doing well and has no complaints at this time. She is excited to go to lunch with her brother today.  Her last iron infusion was in June. In December, her ferritin was 983 ad iron saturation was 29%.  She denies fatigue, ice cravings, rash, dizziness, headaches, SOB, chest pain, palpitations, abdominal pain, constipation, diarrhea, blood in urine or stool. No episodes of bleeding.  No swelling, tenderness, numbness or tingling in her extremities. No new aches or pains. Her appetite is good and she is staying hydrated.  In December, her monoclonal spike was not detected. Her IgG level was 1320 mg/dl and kappa light chain was 2.07 mg/dl.   Medications:    Medication List       This list is accurate as of: 11/01/14  9:50 AM.  Always use your most recent med list.               albuterol 108 (90 BASE) MCG/ACT inhaler  Commonly known as:  PROVENTIL HFA;VENTOLIN HFA  Inhale 2 puffs into the lungs every 6 (six) hours as needed for wheezing or shortness of breath. As needed only     amLODipine 10 MG tablet  Commonly known as:  NORVASC  Take 1 tablet (10 mg total) by mouth daily.     aspirin 81 MG tablet  Take 81 mg by mouth daily.     atorvastatin 10 MG tablet  Commonly known as:  LIPITOR  Take 10 mg by mouth daily.     CARAFATE 1 GM/10ML suspension  Generic drug:  sucralfate  take 10 milliliters (2 teaspoonfuls) by mouth every 6 hours     Cyanocobalamin 1000 MCG/ML Kit  Inject as directed every 30 (thirty) days.     docusate sodium 100 MG capsule  Commonly known as:  COLACE  Take 100 mg by mouth as needed for constipation.     ferrous sulfate 325 (65 FE) MG tablet  take 1 tablet by mouth once daily WITH BREAKFAST     fluticasone 50 MCG/BLIST diskus inhaler  Commonly known as:  FLOVENT DISKUS  Inhale 1 puff into the lungs 2 (two) times daily.     Interferon Beta-1b 0.3 MG Kit injection  Commonly known as:  BETASERON  Inject 0.25 mg into the skin every other day.     losartan 50 MG tablet  Commonly known as:  COZAAR  Take 1 tablet (50 mg total) by mouth daily.     metoprolol succinate 25 MG 24 hr tablet  Commonly known as:  TOPROL-XL  Take 25 mg by mouth daily.     omeprazole 20 MG capsule  Commonly known as:  PRILOSEC  take 1 capsule by mouth once daily     ranitidine 150 MG tablet  Commonly known as:  ZANTAC  Take one tablet twice a day     VOLTAREN 1 % Gel  Generic drug:  diclofenac sodium  apply 2 grams to THE LEFT KNEE three times a day        Allergies:  Allergies  Allergen Reactions  . Celebrex [Celecoxib]   . Dristan Other (See Comments)    "  made my chest tight"  . Robitussin (Alcohol Free) [Guaifenesin] Other (See Comments)    "made my chest tight"  . Ropinirole Hcl Other (See Comments)    "made my chest tight"  . Tramadol Other (See Comments)    "made my chest tight"    Past Medical History, Surgical history, Social history, and Family History were reviewed and updated.  Review of Systems: All other 10 point review of systems is negative.   Physical Exam:  vitals were not taken for this visit.  Wt Readings from Last 3 Encounters:  10/12/14 209 lb (94.802 kg)  09/16/14 207 lb 9.6 oz (94.167 kg)  08/12/14 208 lb (94.348 kg)    Ocular: Sclerae unicteric, pupils equal, round and reactive to light Ear-nose-throat: Oropharynx clear, dentition fair Lymphatic: No cervical or supraclavicular adenopathy Lungs no rales or rhonchi, good excursion bilaterally Heart regular rate and rhythm, no murmur appreciated Abd soft, nontender, positive bowel sounds MSK no focal spinal  tenderness, no joint edema Neuro: non-focal, well-oriented, appropriate affect Breasts: Deferred  Lab Results  Component Value Date   WBC 4.9 08/12/2014   HGB 11.1* 08/12/2014   HCT 33.9* 08/12/2014   MCV 94.4 08/12/2014   PLT 142* 08/12/2014   Lab Results  Component Value Date   FERRITIN 983* 07/27/2014   IRON 74 07/27/2014   TIBC 255 07/27/2014   UIBC 181 07/27/2014   IRONPCTSAT 29 07/27/2014   Lab Results  Component Value Date   RETICCTPCT 0.9 10/05/2013   RBC 3.59* 08/12/2014   RETICCTABS 36.5 10/05/2013   Lab Results  Component Value Date   KPAFRELGTCHN 2.07* 07/27/2014   LAMBDASER 2.36 07/27/2014   KAPLAMBRATIO 0.88 07/27/2014   Lab Results  Component Value Date   IGGSERUM 1320 07/27/2014   IGA 207 07/27/2014   IGMSERUM 73 07/27/2014   Lab Results  Component Value Date   TOTALPROTELP 7.0 07/27/2014   ALBUMINELP 53.8* 07/27/2014   A1GS 5.4* 07/27/2014   A2GS 11.2 07/27/2014   BETS 5.2 07/27/2014   BETA2SER 5.8 07/27/2014   GAMS 18.6 07/27/2014   MSPIKE NOT DET 07/27/2014   SPEI * 07/27/2014     Chemistry      Component Value Date/Time   NA 140 08/12/2014 1148   NA 137 07/27/2014 1031   NA 140 01/28/2013 1421   K 4.1 08/12/2014 1148   K 3.9 07/27/2014 1031   CL 104 08/12/2014 1148   CL 104 07/27/2014 1031   CO2 26 08/12/2014 1148   CO2 27 07/27/2014 1031   BUN 17 08/12/2014 1148   BUN 23* 07/27/2014 1031   BUN 20 01/28/2013 1421   CREATININE 1.24* 08/12/2014 1148   CREATININE 0.70 11/30/2013 0908      Component Value Date/Time   CALCIUM 8.8 08/12/2014 1148   CALCIUM 8.9 07/27/2014 1031   ALKPHOS 65 08/12/2014 1148   ALKPHOS 60 07/27/2014 1031   AST 24 08/12/2014 1148   AST 34 07/27/2014 1031   ALT 20 08/12/2014 1148   ALT 27 07/27/2014 1031   BILITOT 0.4 08/12/2014 1148   BILITOT 0.70 07/27/2014 1031     Impression and Plan: Ms. Stokes is 64 year old female with a plasmacytoma. She had radiation to the  left ribs in February of 2014.  She has shown no evidence of recurrence.  He CBC today was ok. She will not need Fereheme at this time.  We will give her Zometa.  We will see her back in 3 months for labs, follow-up and infusion.  She knows to call here with any questions or concerns. We can certainly see her sooner if need be.   Eliezer Bottom, NP 3/23/20169:50 AM

## 2014-11-02 NOTE — Telephone Encounter (Signed)
Dr Everlene Farrier, do you want to give RFs of this to pt? I changed quantity so she will have enough for 30 days per RF and pended.

## 2014-11-06 LAB — PROTEIN ELECTROPHORESIS, SERUM, WITH REFLEX
ALPHA-1-GLOBULIN: 0.4 g/dL — AB (ref 0.2–0.3)
Albumin ELP: 3.7 g/dL — ABNORMAL LOW (ref 3.8–4.8)
Alpha-2-Globulin: 0.7 g/dL (ref 0.5–0.9)
Beta 2: 0.4 g/dL (ref 0.2–0.5)
Beta Globulin: 0.3 g/dL — ABNORMAL LOW (ref 0.4–0.6)
GAMMA GLOBULIN: 1.2 g/dL (ref 0.8–1.7)
Total Protein, Serum Electrophoresis: 6.7 g/dL (ref 6.1–8.1)

## 2014-11-06 LAB — IGG, IGA, IGM
IgA: 197 mg/dL (ref 69–380)
IgG (Immunoglobin G), Serum: 1360 mg/dL (ref 690–1700)
IgM, Serum: 67 mg/dL (ref 52–322)

## 2014-11-06 LAB — KAPPA/LAMBDA LIGHT CHAINS
Kappa free light chain: 3.11 mg/dL — ABNORMAL HIGH (ref 0.33–1.94)
Kappa:Lambda Ratio: 1.76 — ABNORMAL HIGH (ref 0.26–1.65)
LAMBDA FREE LGHT CHN: 1.77 mg/dL (ref 0.57–2.63)

## 2014-11-06 LAB — IFE INTERPRETATION

## 2014-11-07 ENCOUNTER — Ambulatory Visit (INDEPENDENT_AMBULATORY_CARE_PROVIDER_SITE_OTHER): Payer: BC Managed Care – PPO | Admitting: Internal Medicine

## 2014-11-07 VITALS — BP 140/80 | HR 56 | Temp 97.6°F | Resp 18 | Ht 61.0 in | Wt 209.6 lb

## 2014-11-07 DIAGNOSIS — R05 Cough: Secondary | ICD-10-CM

## 2014-11-07 DIAGNOSIS — G35 Multiple sclerosis: Secondary | ICD-10-CM

## 2014-11-07 DIAGNOSIS — E538 Deficiency of other specified B group vitamins: Secondary | ICD-10-CM | POA: Diagnosis not present

## 2014-11-07 DIAGNOSIS — Z79899 Other long term (current) drug therapy: Secondary | ICD-10-CM

## 2014-11-07 DIAGNOSIS — C9000 Multiple myeloma not having achieved remission: Secondary | ICD-10-CM | POA: Diagnosis not present

## 2014-11-07 DIAGNOSIS — J385 Laryngeal spasm: Secondary | ICD-10-CM

## 2014-11-07 DIAGNOSIS — R059 Cough, unspecified: Secondary | ICD-10-CM

## 2014-11-07 DIAGNOSIS — R062 Wheezing: Secondary | ICD-10-CM

## 2014-11-07 MED ORDER — HYDROCODONE-ACETAMINOPHEN 7.5-325 MG/15ML PO SOLN: 10.0000 mL | Freq: Four times a day (QID) | ORAL | Status: DC | PRN

## 2014-11-07 MED ORDER — ALBUTEROL SULFATE HFA 108 (90 BASE) MCG/ACT IN AERS: 2.0000 | INHALATION_SPRAY | Freq: Four times a day (QID) | RESPIRATORY_TRACT | Status: DC | PRN

## 2014-11-07 MED ORDER — CYANOCOBALAMIN 1000 MCG/ML IJ SOLN
1000.0000 ug | Freq: Once | INTRAMUSCULAR | Status: AC
  Administered 2014-11-07: 1000 ug via INTRAMUSCULAR

## 2014-11-07 MED ORDER — IPRATROPIUM BROMIDE 0.02 % IN SOLN
0.5000 mg | Freq: Once | RESPIRATORY_TRACT | Status: AC
  Administered 2014-11-07: 0.5 mg via RESPIRATORY_TRACT

## 2014-11-07 MED ORDER — ALBUTEROL SULFATE (2.5 MG/3ML) 0.083% IN NEBU
2.5000 mg | INHALATION_SOLUTION | Freq: Once | RESPIRATORY_TRACT | Status: AC
  Administered 2014-11-07: 2.5 mg via RESPIRATORY_TRACT

## 2014-11-07 NOTE — Progress Notes (Signed)
   Subjective:    Patient ID: Morgan Espinoza, female    DOB: Jun 18, 1951, 64 y.o.   MRN: [id number]  HPI Note scribed by Frutoso Chase R.T.(R) edited by Lou Miner, me.  64 year old female here for loss of voice, wheezing, and asthma. Pt has slowly been losing her voice over the past two days. Intermittent cough. No fever. No sore throat. No chest pain. No SOB. Pt has allergies this time of year. Pt's main concern today is her wheezing. Pt has been using albuterol inhaler with some relief. Pt has been using about two puffs every two to three hours.   Pt is also requesting her B12 injection today. She receives them regular.  She recently recived IV therapy for her myeloma, she also is on therapy for MS. On zometa for myeloma.  Review of Systems     Objective:   Physical Exam  Constitutional: She is oriented to person, place, and time. She appears well-developed and well-nourished. No distress.  HENT:  Head: Normocephalic.  Right Ear: External ear normal.  Left Ear: External ear normal.  Nose: Nose normal.  Mouth/Throat: Uvula is midline, oropharynx is clear and moist and mucous membranes are normal. No uvula swelling. No posterior oropharyngeal edema.  Voice is raspy and hoarse.   Eyes: Conjunctivae and EOM are normal. Pupils are equal, round, and reactive to light. No scleral icterus.  Neck: Normal range of motion. Neck supple. No tracheal deviation present. No thyromegaly present.  Cardiovascular: Normal rate, regular rhythm and normal heart sounds.   Pulmonary/Chest: Effort normal. No respiratory distress. She has wheezes. She has no rales. She exhibits no tenderness.  Lymphadenopathy:    She has no cervical adenopathy.  Neurological: She is alert and oriented to person, place, and time. She exhibits normal muscle tone. Coordination normal.  Vitals reviewed.   Nebulizer tx much improvement      Assessment & Plan:  Laryngealspasm/Bronchospasm/Cough B12 deficiency

## 2014-11-07 NOTE — Patient Instructions (Signed)
Bronchospasm A bronchospasm is a spasm or tightening of the airways going into the lungs. During a bronchospasm breathing becomes more difficult because the airways get smaller. When this happens there can be coughing, a whistling sound when breathing (wheezing), and difficulty breathing. Bronchospasm is often associated with asthma, but not all patients who experience a bronchospasm have asthma. CAUSES  A bronchospasm is caused by inflammation or irritation of the airways. The inflammation or irritation may be triggered by:   Allergies (such as to animals, pollen, food, or mold). Allergens that cause bronchospasm may cause wheezing immediately after exposure or many hours later.   Infection. Viral infections are believed to be the most common cause of bronchospasm.   Exercise.   Irritants (such as pollution, cigarette smoke, strong odors, aerosol sprays, and paint fumes).   Weather changes. Winds increase molds and pollens in the air. Rain refreshes the air by washing irritants out. Cold air may cause inflammation.   Stress and emotional upset.  SIGNS AND SYMPTOMS   Wheezing.   Excessive nighttime coughing.   Frequent or severe coughing with a simple cold.   Chest tightness.   Shortness of breath.  DIAGNOSIS  Bronchospasm is usually diagnosed through a history and physical exam. Tests, such as chest X-rays, are sometimes done to look for other conditions. TREATMENT   Inhaled medicines can be given to open up your airways and help you breathe. The medicines can be given using either an inhaler or a nebulizer machine.  Corticosteroid medicines may be given for severe bronchospasm, usually when it is associated with asthma. HOME CARE INSTRUCTIONS   Always have a plan prepared for seeking medical care. Know when to call your health care provider and local emergency services (911 in the [place] Know where you can access local emergency care.  Only take medicines as  directed by your health care provider.  If you were prescribed an inhaler or nebulizer machine, ask your health care provider to explain how to use it correctly. Always use a spacer with your inhaler if you were given one.  It is necessary to remain calm during an attack. Try to relax and breathe more slowly.  Control your home environment in the following ways:   Change your heating and air conditioning filter at least once a month.   Limit your use of fireplaces and wood stoves.  Do not smoke and do not allow smoking in your home.   Avoid exposure to perfumes and fragrances.   Get rid of pests (such as roaches and mice) and their droppings.   Throw away plants if you see mold on them.   Keep your house clean and dust free.   Replace carpet with wood, tile, or vinyl flooring. Carpet can trap dander and dust.   Use allergy-proof pillows, mattress covers, and box spring covers.   Wash bed sheets and blankets every week in hot water and dry them in a dryer.   Use blankets that are made of polyester or cotton.   Wash hands frequently. SEEK MEDICAL CARE IF:   You have muscle aches.   You have chest pain.   The sputum changes from clear or white to yellow, green, gray, or bloody.   The sputum you cough up gets thicker.   There are problems that may be related to the medicine you are given, such as a rash, itching, swelling, or trouble breathing.  SEEK IMMEDIATE MEDICAL CARE IF:   You have worsening wheezing and coughing even   after taking your prescribed medicines.   You have increased difficulty breathing.   You develop severe chest pain. MAKE SURE YOU:   Understand these instructions.  Will watch your condition.  Will get help right away if you are not doing well or get worse. Document Released: 07/31/2003 Document Revised: 08/02/2013 Document Reviewed: 01/17/2013 [facility] Patient Information 2015 [place], [place]. This information is not  intended to replace advice given to you by your health care provider. Make sure you discuss any questions you have with your health care provider. Vitamin B12 Deficiency Not having enough vitamin B12 is called a deficiency. Vitamin B12 is an important vitamin. Your body needs vitamin B12 to:   Make red blood cells.  Make DNA. This is the genetic material inside all of your cells.  Help your nerves work properly so they can carry messages from your brain to your body. CAUSES  Not eating enough foods that contain vitamin B12.  Not having enough stomach acid and digestive juices. The body needs these to absorb vitamin B12 from the food you eat.  Having certain digestive system diseases that make it hard to absorb vitamin B12. These diseases include Crohn's disease, chronic pancreatitis, and cystic fibrosis.  Having pernicious anemia, which is a condition where the body has too few red blood cells. People with this condition do not make enough of a protein called "intrinsic factor," which is needed to absorb vitamin B12.  Having a surgery in which part of the stomach or small intestine is removed.  Taking certain medicines that make it hard for the body to absorb vitamin B12. These medicines include:  Heartburn medicine (antacids and proton pump inhibitors).  A certain antibiotic medicine called neomycin, which fights infection.  Some medicines used to treat diabetes, tuberculosis, gout, and high cholesterol. RISK FACTORS Risk factors are things that make you more likely to develop a vitamin B12 deficiency. They include:  Being older than 56.  Being a vegetarian.  Being pregnant and a vegetarian or having a poor diet.  Taking certain drugs.  Being an alcoholic. SYMPTOMS You may have a vitamin B12 deficiency with no symptoms. However, a vitamin B12 deficiency can cause health problems like anemia and nerve damage. These health problems can lead to many possible symptoms,  including:  Weakness.  Fatigue.  Loss of appetite.  Weight loss.  Numbness or tingling in your hands and feet.  Redness and burning of the tongue.  Confusion or memory problems.  Depression.  Dizziness.  Sensory problems, such as loss of taste, color blindness, and ringing in the ears.  Diarrhea or constipation.  Trouble walking. DIAGNOSIS Various types of tests can be given to help find the cause of your vitamin B12 deficiency. These tests include:  A complete blood count (CBC). This test gives your caregiver an overall picture of what makes up your blood.  A blood test to measure your B12 level.  A blood test to measure intrinsic factor.  An endoscopy. This procedure uses a thin tube with a camera on the end to look into your stomach or intestines. TREATMENT Treatment for vitamin B12 deficiency depends on what is causing it. Common options include:  Changing your eating and drinking habits, such as:  Eating more foods that contain vitamin B12.  Not drinking as much alcohol or any alcohol.  Taking vitamin B12 supplements. Your caregiver will tell you what dose is best for you.  Getting vitamin B12 injections. Some people get these a few times a  week. Others get them once a month. HOME CARE INSTRUCTIONS  Take all supplements as directed by your caregiver. Follow the directions carefully.  Get any injections your caregiver prescribes. Do not miss your appointments.  Eat lots of healthy foods that contain vitamin B12. Ask your caregiver if you should work with a nutritionist. Good things to include in your diet are:  Meat.  Poultry.  Fish.  Eggs.  Fortified cereal and dairy products. This means vitamin B12 has been added to the food. Check the label on the package to be sure.  Do not abuse alcohol.  Keep all follow-up appointments. Your caregiver will need to perform blood tests to make sure your vitamin B12 deficiency is going away. SEEK MEDICAL CARE  IF:  You have any questions about your treatment.  Your symptoms come back. MAKE SURE YOU:  Understand these instructions.  Will watch your condition.  Will get help right away if you are not doing well or get worse. Document Released: 10/20/2011 Document Reviewed: 10/20/2011 [facility] Patient Information 2015 [place]. This information is not intended to replace advice given to you by your health care provider. Make sure you discuss any questions you have with your health care provider.

## 2014-11-09 ENCOUNTER — Ambulatory Visit
Admission: RE | Admit: 2014-11-09 | Discharge: 2014-11-09 | Disposition: A | Discharge status: Home / Self Care | Payer: BC Managed Care – PPO | Source: Ambulatory Visit

## 2014-11-09 DIAGNOSIS — Z1231 Encounter for screening mammogram for malignant neoplasm of breast: Secondary | ICD-10-CM

## 2014-11-10 ENCOUNTER — Other Ambulatory Visit: Payer: Self-pay | Admitting: Emergency Medicine

## 2014-11-13 NOTE — Telephone Encounter (Signed)
Dr Everlene Farrier, pt has been seen regularly and recently for other issues, but don't see this med discussed. OK to RF?

## 2014-12-06 ENCOUNTER — Other Ambulatory Visit: Payer: Self-pay | Admitting: Emergency Medicine

## 2014-12-26 ENCOUNTER — Ambulatory Visit (INDEPENDENT_AMBULATORY_CARE_PROVIDER_SITE_OTHER): Payer: BC Managed Care – PPO | Admitting: Internal Medicine

## 2014-12-26 VITALS — BP 144/72 | HR 73 | Temp 98.2°F | Resp 12 | Ht 62.0 in | Wt 211.0 lb

## 2014-12-26 DIAGNOSIS — E538 Deficiency of other specified B group vitamins: Secondary | ICD-10-CM | POA: Diagnosis not present

## 2014-12-26 DIAGNOSIS — N644 Mastodynia: Secondary | ICD-10-CM | POA: Diagnosis not present

## 2014-12-26 DIAGNOSIS — K1379 Other lesions of oral mucosa: Secondary | ICD-10-CM | POA: Diagnosis not present

## 2014-12-26 MED ORDER — HYDROCODONE-ACETAMINOPHEN 5-325 MG PO TABS: 1.0000 | ORAL_TABLET | Freq: Four times a day (QID) | ORAL | Status: DC | PRN

## 2014-12-26 MED ORDER — AMOXICILLIN 500 MG PO CAPS: 1000.0000 mg | ORAL_CAPSULE | Freq: Two times a day (BID) | ORAL | Status: DC

## 2014-12-26 MED ORDER — CYANOCOBALAMIN 1000 MCG/ML IJ SOLN
1000.0000 ug | Freq: Once | INTRAMUSCULAR | Status: AC
  Administered 2014-12-26: 1000 ug via INTRAMUSCULAR

## 2014-12-26 NOTE — Progress Notes (Deleted)
    MRN: [id number] DOB: [birth year]  Subjective:   Morgan Espinoza is a 64 y.o. female presenting for chief complaint of Breast Pain and Injections  Reports . Has tried *** relief. Denies . *** smoking, *** alcohol. Denies any other aggravating or relieving factors, no other questions or concerns.  Criselda has a current medication list which includes the following prescription(s): albuterol, albuterol, amlodipine, aspirin, atorvastatin, carafate, chlorpheniramine-hydrocodone, cyanocobalamin, docusate sodium, ferrous sulfate, fluticasone, hydrocodone-acetaminophen, interferon beta-1b, losartan, metoprolol succinate, omeprazole, ranitidine, and voltaren, and the following Facility-Administered Medications: sodium chloride. She is allergic to celebrex; dristan; robitussin (alcohol free); ropinirole hcl; and tramadol.  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 Also  has past surgical history that includes Cholecystectomy; Abdominal hysterectomy; Breast surgery; Rotator cuff repair; and Bone biopsy (05/17/12).  ROS As in subjective.  Objective:   Vitals: BP 144/72 mmHg  Pulse 73  Temp(Src) 98.2 F (36.8 C) (Oral)  Resp 12  Ht 5\' 2"  (1.575 m)  Wt 211 lb (95.709 kg)  BMI 38.58 kg/m2  SpO2 98%  Physical Exam  No results found for this or any previous visit (from the past 24 hour(s)).  Assessment and Plan :     Jaynee Eagles, PA-C [facility] [phone] 12/26/2014 12:39 PM

## 2014-12-26 NOTE — Patient Instructions (Signed)

## 2014-12-26 NOTE — Progress Notes (Signed)
   Subjective:    Patient ID: Morgan Espinoza, female    DOB: Mar 19, 1951, 64 y.o.   MRN: [id number]  HPI This note is scribed by Benson Setting, CMA for Dr. Reesa Chew  64 y.o. Female c/o of left breast pain. States the pain started yesterday. She has had a biopsy on her left breast a year ago. She c/o of tooth pain. States that it hurts to eat. Pain one spot under bridge. Breast pain at one year old scar site, Tender with small amt of induration.   Review of Systems     Objective:   Physical Exam  Constitutional: She is oriented to person, place, and time. She appears well-developed and well-nourished. No distress.  HENT:  Head: Normocephalic.  Mouth/Throat: Oropharynx is clear and moist and mucous membranes are normal. She has dentures. No oral lesions. Abnormal dentition. No uvula swelling.    Neck: Normal range of motion.  Pulmonary/Chest: Effort normal. She exhibits tenderness and swelling. She exhibits no mass, no bony tenderness and no deformity. Right breast exhibits no inverted nipple, no mass, no nipple discharge, no skin change and no tenderness. Left breast exhibits tenderness. Left breast exhibits no inverted nipple, no mass, no nipple discharge and no skin change.    Small induration  Musculoskeletal: Normal range of motion.  Neurological: She is alert and oriented to person, place, and time.  Skin: No rash noted.  Psychiatric: She has a normal mood and affect.          Assessment & Plan:  B12 deficiency/B12 injection Tender breast at scar/probable early infection Mouth pain tender at bridge post tooth Trial amoxil/vicodin

## 2015-01-30 ENCOUNTER — Telehealth: Payer: Self-pay | Admitting: Hematology & Oncology

## 2015-01-30 NOTE — Telephone Encounter (Signed)
Returned pt call and Lt mess for pt regarding her appt for tomorrow.

## 2015-01-31 ENCOUNTER — Ambulatory Visit (HOSPITAL_BASED_OUTPATIENT_CLINIC_OR_DEPARTMENT_OTHER): Payer: BC Managed Care – PPO

## 2015-01-31 ENCOUNTER — Encounter: Payer: Self-pay | Admitting: Hematology & Oncology

## 2015-01-31 ENCOUNTER — Ambulatory Visit (HOSPITAL_BASED_OUTPATIENT_CLINIC_OR_DEPARTMENT_OTHER): Payer: BC Managed Care – PPO | Admitting: Hematology & Oncology

## 2015-01-31 VITALS — BP 144/65 | HR 62 | Temp 97.9°F | Resp 14 | Wt 208.0 lb

## 2015-01-31 DIAGNOSIS — C9030 Solitary plasmacytoma not having achieved remission: Secondary | ICD-10-CM

## 2015-01-31 DIAGNOSIS — C9000 Multiple myeloma not having achieved remission: Secondary | ICD-10-CM

## 2015-01-31 DIAGNOSIS — D509 Iron deficiency anemia, unspecified: Secondary | ICD-10-CM

## 2015-01-31 LAB — CBC WITH DIFFERENTIAL (CANCER CENTER ONLY)
BASO#: 0 10*3/uL (ref 0.0–0.2)
BASO%: 0.2 % (ref 0.0–2.0)
EOS%: 12.4 % — ABNORMAL HIGH (ref 0.0–7.0)
Eosinophils Absolute: 0.7 10*3/uL — ABNORMAL HIGH (ref 0.0–0.5)
HCT: 35.4 % (ref 34.8–46.6)
HEMOGLOBIN: 11.8 g/dL (ref 11.6–15.9)
LYMPH#: 1.7 10*3/uL (ref 0.9–3.3)
LYMPH%: 30.9 % (ref 14.0–48.0)
MCH: 32.1 pg (ref 26.0–34.0)
MCHC: 33.3 g/dL (ref 32.0–36.0)
MCV: 96 fL (ref 81–101)
MONO#: 0.5 10*3/uL (ref 0.1–0.9)
MONO%: 9.3 % (ref 0.0–13.0)
NEUT%: 47.2 % (ref 39.6–80.0)
NEUTROS ABS: 2.6 10*3/uL (ref 1.5–6.5)
PLATELETS: 187 10*3/uL (ref 145–400)
RBC: 3.68 10*6/uL — ABNORMAL LOW (ref 3.70–5.32)
RDW: 12.2 % (ref 11.1–15.7)
WBC: 5.5 10*3/uL (ref 3.9–10.0)

## 2015-01-31 LAB — CMP (CANCER CENTER ONLY)
ALT(SGPT): 31 U/L (ref 10–47)
AST: 38 U/L (ref 11–38)
Albumin: 3.5 g/dL (ref 3.3–5.5)
Alkaline Phosphatase: 68 U/L (ref 26–84)
BILIRUBIN TOTAL: 0.8 mg/dL (ref 0.20–1.60)
BUN: 14 mg/dL (ref 7–22)
CO2: 29 mEq/L (ref 18–33)
Calcium: 9 mg/dL (ref 8.0–10.3)
Chloride: 112 mEq/L — ABNORMAL HIGH (ref 98–108)
Creat: 0.8 mg/dl (ref 0.6–1.2)
GLUCOSE: 90 mg/dL (ref 73–118)
Potassium: 3.6 mEq/L (ref 3.3–4.7)
Sodium: 143 mEq/L (ref 128–145)
Total Protein: 7.2 g/dL (ref 6.4–8.1)

## 2015-01-31 LAB — IRON AND TIBC CHCC
%SAT: 39 % (ref 21–57)
Iron: 94 ug/dL (ref 41–142)
TIBC: 244 ug/dL (ref 236–444)
UIBC: 149 ug/dL (ref 120–384)

## 2015-01-31 LAB — FERRITIN CHCC: FERRITIN: 978 ng/mL — AB (ref 9–269)

## 2015-01-31 MED ORDER — SODIUM CHLORIDE 0.9 % IV SOLN
INTRAVENOUS | Status: DC
  Administered 2015-01-31: 11:00:00 via INTRAVENOUS

## 2015-01-31 MED ORDER — ZOLEDRONIC ACID 4 MG/100ML IV SOLN
4.0000 mg | Freq: Once | INTRAVENOUS | Status: AC
  Administered 2015-01-31: 4 mg via INTRAVENOUS
  Filled 2015-01-31: qty 100

## 2015-01-31 NOTE — Progress Notes (Signed)
Hematology and Oncology Follow Up Visit  Morgan Espinoza [id number] 1950/12/23 64 y.o. 01/31/2015   Principle Diagnosis:   IgG Kappa plasmacytoma  Iron deficiency anemia  Current Therapy:    Zometa 4 mg IV q. 3 months IV iron-last received Feraheme in January with a dose of 1020 mg     Interim History:  [name] is back for followup. We see her every 3 months. She's doing quite well. She has no pain. She's had no cough. There's been no nausea or vomiting. She's had no change in bowel or bladder habits.   Her myeloma studies have been looking pretty good. There may be a faint myeloma spike. Her IgG level back in March was 1360 mg/dL. Her Kappa Light chain is 3.11 mg/dL.  Her appetite has been good.  She's had no leg swelling. She's had no pain in her ribs.  Overall, her performance status is ECOG 1.   Medications:  Current outpatient prescriptions:  .  albuterol (PROVENTIL HFA;VENTOLIN HFA) 108 (90 BASE) MCG/ACT inhaler, Inhale 2 puffs into the lungs every 6 (six) hours as needed for wheezing or shortness of breath., Disp: 1 Inhaler, Rfl: 6 .  amLODipine (NORVASC) 10 MG tablet, Take 1 tablet (10 mg total) by mouth daily., Disp: 90 tablet, Rfl: 3 .  amoxicillin (AMOXIL) 500 MG capsule, Take 2 capsules (1,000 mg total) by mouth 2 (two) times daily., Disp: 40 capsule, Rfl: 0 .  aspirin 81 MG tablet, Take 81 mg by mouth daily., Disp: , Rfl:  .  atorvastatin (LIPITOR) 10 MG tablet, Take 10 mg by mouth daily., Disp: , Rfl:  .  CARAFATE 1 GM/10ML suspension, TAKE  10 ML BY MOUTH EVERY 6 HOURS, Disp: 1260 mL, Rfl: 3 .  Cyanocobalamin 1000 MCG/ML KIT, Inject as directed every 30 (thirty) days., Disp: , Rfl:  .  docusate sodium (COLACE) 100 MG capsule, Take 100 mg by mouth as needed for constipation., Disp: , Rfl:  .  ferrous sulfate 325 (65 FE) MG tablet, take 1 tablet by mouth once daily WITH BREAKFAST, Disp: 30 tablet, Rfl: 11 .  fluticasone (FLOVENT DISKUS) 50 MCG/BLIST diskus inhaler,  Inhale 1 puff into the lungs 2 (two) times daily., Disp: , Rfl:  .  HYDROcodone-acetaminophen (HYCET) 7.5-325 mg/15 ml solution, Take 10 mLs by mouth every 6 (six) hours as needed., Disp: 120 mL, Rfl: 0 .  Interferon Beta-1b (BETASERON) 0.3 MG KIT injection, Inject 0.25 mg into the skin every other day., Disp: 1 kit, Rfl: 6 .  losartan (COZAAR) 50 MG tablet, Take 1 tablet (50 mg total) by mouth daily., Disp: 30 tablet, Rfl: 5 .  metoprolol succinate (TOPROL-XL) 25 MG 24 hr tablet, Take 25 mg by mouth daily. , Disp: , Rfl:  .  omeprazole (PRILOSEC) 20 MG capsule, TAKE ONE CAPSULE BY MOUTH ONCE DAILY, Disp: 30 capsule, Rfl: 4 .  ranitidine (ZANTAC) 150 MG tablet, TAKE ONE TABLET BY MOUTH TWICE DAILY, Disp: 60 tablet, Rfl: 5 .  HYDROcodone-acetaminophen (NORCO/VICODIN) 5-325 MG per tablet, Take 1 tablet by mouth every 6 (six) hours as needed. (Patient not taking: Reported on 01/31/2015), Disp: 30 tablet, Rfl: 0 .  VOLTAREN 1 % GEL, apply 2 grams to THE LEFT KNEE three times a day, Disp: 100 g, Rfl: 2 No current facility-administered medications for this visit.  Facility-Administered Medications Ordered in Other Visits:  .  0.9 %  sodium chloride infusion, , Intravenous, Continuous, [name], MD, Stopped at 11/30/13 1216  Allergies:  Allergies  Allergen Reactions  . Celebrex [Celecoxib]   . Dristan Other (See Comments)    "made my chest tight"  . Robitussin (Alcohol Free) [Guaifenesin] Other (See Comments)    "made my chest tight"  . Ropinirole Hcl Other (See Comments)    "made my chest tight"  . Tramadol Other (See Comments)    "made my chest tight"    Past Medical History, Surgical history, Social history, and Family History were reviewed and updated.  Review of Systems: As above  Physical Exam:  weight is 208 lb (94.348 kg). Her oral temperature is 97.9 F (36.6 C). Her blood pressure is 144/65 and her pulse is 62. Her respiration is 14.   Well developed well nourished after  mecca female. Her head and neck exam shows no ocular or oral lesions. She is no palpable cervical or supraclavicular lymph nodes. Lungs are clear. Cardiac exam shows a regular rate and rhythm with no murmurs rubs or bruits. Abdomen is soft. She is good bowel sounds. There is no fluid wave. There is no palpable liver or spleen tip. Back exam no tenderness over the spine ribs or hips. Extremities shows no clubbing cyanosis or edema. Skin exam no rashes. Neurological exam is nonfocal.  Lab Results  Component Value Date   WBC 5.5 01/31/2015   HGB 11.8 01/31/2015   HCT 35.4 01/31/2015   MCV 96 01/31/2015   PLT 187 01/31/2015     Chemistry      Component Value Date/Time   NA 145 11/01/2014 0937   NA 140 08/12/2014 1148   NA 140 01/28/2013 1421   K 3.8 11/01/2014 0937   K 4.1 08/12/2014 1148   CL 106 11/01/2014 0937   CL 104 08/12/2014 1148   CO2 30 11/01/2014 0937   CO2 26 08/12/2014 1148   BUN 26* 11/01/2014 0937   BUN 17 08/12/2014 1148   BUN 20 01/28/2013 1421   CREATININE 0.8 11/01/2014 0937   CREATININE 0.70 11/30/2013 0908      Component Value Date/Time   CALCIUM 9.4 11/01/2014 0937   CALCIUM 8.8 08/12/2014 1148   ALKPHOS 77 11/01/2014 0937   ALKPHOS 65 08/12/2014 1148   AST 36 11/01/2014 0937   AST 24 08/12/2014 1148   ALT 29 11/01/2014 0937   ALT 20 08/12/2014 1148   BILITOT 0.60 11/01/2014 0937   BILITOT 0.4 08/12/2014 1148         Impression and Plan: Morgan Espinoza is 64 year old female with a plasmacytoma. She had radiation for this. This is in the left ribs. This was back in February of 2014  So far, we have not found any evidence of recurrence. Her myeloma studies might be increasing slowly. We will have to continue to monitor these whenever we see her back.  We will give her Zometa today.  I will plan to see her back in 3 more months.  [name], MD 6/22/201610:39 AM

## 2015-01-31 NOTE — Patient Instructions (Signed)

## 2015-02-02 LAB — PROTEIN ELECTROPHORESIS, SERUM, WITH REFLEX
Albumin ELP: 3.7 g/dL — ABNORMAL LOW (ref 3.8–4.8)
Alpha-1-Globulin: 0.4 g/dL — ABNORMAL HIGH (ref 0.2–0.3)
Alpha-2-Globulin: 0.8 g/dL (ref 0.5–0.9)
BETA 2: 0.4 g/dL (ref 0.2–0.5)
Beta Globulin: 0.4 g/dL (ref 0.4–0.6)
Gamma Globulin: 1.2 g/dL (ref 0.8–1.7)
Total Protein, Serum Electrophoresis: 6.8 g/dL (ref 6.1–8.1)

## 2015-02-02 LAB — KAPPA/LAMBDA LIGHT CHAINS
Kappa free light chain: 3.03 mg/dL — ABNORMAL HIGH (ref 0.33–1.94)
Kappa:Lambda Ratio: 1.55 (ref 0.26–1.65)
Lambda Free Lght Chn: 1.95 mg/dL (ref 0.57–2.63)

## 2015-02-02 LAB — IGG, IGA, IGM
IGA: 178 mg/dL (ref 69–380)
IgG (Immunoglobin G), Serum: 1250 mg/dL (ref 690–1700)
IgM, Serum: 59 mg/dL (ref 52–322)

## 2015-02-02 LAB — IFE INTERPRETATION

## 2015-02-08 ENCOUNTER — Encounter: Payer: Self-pay | Admitting: Adult Health

## 2015-02-08 ENCOUNTER — Ambulatory Visit (INDEPENDENT_AMBULATORY_CARE_PROVIDER_SITE_OTHER): Payer: BC Managed Care – PPO | Admitting: Adult Health

## 2015-02-08 VITALS — BP 145/81 | HR 66 | Ht 62.0 in | Wt 211.0 lb

## 2015-02-08 DIAGNOSIS — G35 Multiple sclerosis: Secondary | ICD-10-CM

## 2015-02-08 NOTE — Patient Instructions (Signed)
Continue Betaseron Consider MRI in the future to look for progression If you develop any new symptoms please let us know.

## 2015-02-08 NOTE — Progress Notes (Signed)
PATIENT: Morgan Espinoza DOB: 24-Jul-1951  REASON FOR VISIT: follow up HISTORY FROM: patient  HISTORY OF PRESENT ILLNESS: Morgan Espinoza is a 64 year old female with a history of multiple sclerosis. She returns today for follow-up. She continues to take Betaseron and tolerating it well. She denies any new symptoms. No changes in her gait or balance. Denies any new numbness or weakness. No changes with her bowels or bladder. No change in her vision. She continues to exercise regularly. It has been several years since the patient had an MRI. However she states that she cannot afford to pay for that now. He turns today for an evaluation.  HISTORY 08/01/2014:Morgan Espinoza is a 64 year old female with a history of multiple sclerosis. She returns today for follow-up. She is currently taking Betaseron every other day and tolerating it well. Denies any new numbness or weakness. No changes in gait or balance. Denies any falls. Denies any changes with her bowels or bladder. No changes with her vision. She goes to the gym three times a week. No new medical history since last seen.   HISTORY 01/30/14 (CM): Morgan Espinoza, 64 year old female returns for followup.She has a history of multiple sclerosis. She has had no relapses since 2002, currently on Betaseron every other day tolerating the medication without any injection site problems. She pretreats site with ice . She is Retired. She denies any double vision, loss of vision, sensory changes, loss of bowel or bladder control. She has intermittent urinary frequency and has a history of asthma. She has had no falls, no dizziness, no balance issues. No new neurologic complaints.  REVIEW OF SYSTEMS: Out of a complete 14 system review of symptoms, the patient complains only of the following symptoms, and all other reviewed systems are negative.  See history of present illness  ALLERGIES: Allergies  Allergen Reactions  . Celebrex [Celecoxib]   . Dristan Other (See Comments)      "made my chest tight"  . Robitussin (Alcohol Free) [Guaifenesin] Other (See Comments)    "made my chest tight"  . Ropinirole Hcl Other (See Comments)    "made my chest tight"  . Tramadol Other (See Comments)    "made my chest tight"    HOME MEDICATIONS: Outpatient Prescriptions Prior to Visit  Medication Sig Dispense Refill  . albuterol (PROVENTIL HFA;VENTOLIN HFA) 108 (90 BASE) MCG/ACT inhaler Inhale 2 puffs into the lungs every 6 (six) hours as needed for wheezing or shortness of breath. 1 Inhaler 6  . amLODipine (NORVASC) 10 MG tablet Take 1 tablet (10 mg total) by mouth daily. 90 tablet 3  . amoxicillin (AMOXIL) 500 MG capsule Take 2 capsules (1,000 mg total) by mouth 2 (two) times daily. 40 capsule 0  . atorvastatin (LIPITOR) 10 MG tablet Take 10 mg by mouth daily.    Marland Kitchen CARAFATE 1 GM/10ML suspension TAKE  10 ML BY MOUTH EVERY 6 HOURS 1260 mL 3  . Cyanocobalamin 1000 MCG/ML KIT Inject as directed every 30 (thirty) days.    Marland Kitchen docusate sodium (COLACE) 100 MG capsule Take 100 mg by mouth as needed for constipation.    . ferrous sulfate 325 (65 FE) MG tablet take 1 tablet by mouth once daily WITH BREAKFAST 30 tablet 11  . fluticasone (FLOVENT DISKUS) 50 MCG/BLIST diskus inhaler Inhale 1 puff into the lungs 2 (two) times daily.    Marland Kitchen HYDROcodone-acetaminophen (HYCET) 7.5-325 mg/15 ml solution Take 10 mLs by mouth every 6 (six) hours as needed. 120 mL 0  .  HYDROcodone-acetaminophen (NORCO/VICODIN) 5-325 MG per tablet Take 1 tablet by mouth every 6 (six) hours as needed. 30 tablet 0  . Interferon Beta-1b (BETASERON) 0.3 MG KIT injection Inject 0.25 mg into the skin every other day. 1 kit 6  . losartan (COZAAR) 50 MG tablet Take 1 tablet (50 mg total) by mouth daily. 30 tablet 5  . metoprolol succinate (TOPROL-XL) 25 MG 24 hr tablet Take 25 mg by mouth daily.     Marland Kitchen omeprazole (PRILOSEC) 20 MG capsule TAKE ONE CAPSULE BY MOUTH ONCE DAILY 30 capsule 4  . ranitidine (ZANTAC) 150 MG tablet TAKE  ONE TABLET BY MOUTH TWICE DAILY 60 tablet 5  . VOLTAREN 1 % GEL apply 2 grams to THE LEFT KNEE three times a day 100 g 2  . aspirin 81 MG tablet Take 81 mg by mouth daily.     Facility-Administered Medications Prior to Visit  Medication Dose Route Frequency Provider Last Rate Last Dose  . 0.9 %  sodium chloride infusion   Intravenous Continuous [name], MD   Stopped at 11/30/13 1216    PAST MEDICAL HISTORY: Past Medical History  Diagnosis Date  . Hypertension   . GERD (gastroesophageal reflux disease)   . Asthma   . Multiple sclerosis   . Hyperlipidemia   . Vertigo   . Vitamin D deficiency   . Obesity   . Achilles tendonitis   . OA (osteoarthritis) of knee   . Anemia, iron deficiency 07/23/2012  . 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 Cancer 05/2012    plasma cell myeloma  . S/P radiation therapy 08/12/12 - 09/21/12    left Anterior 2nd Rib Lesion / 50.4 GY / 28 Fractions    PAST SURGICAL HISTORY: Past Surgical History  Procedure Laterality Date  . Cholecystectomy      lap choley  . Abdominal hysterectomy      fibroids  . Breast surgery      left breast biopsy-benign  . Rotator cuff repair    . Bone biopsy  05/17/12    Left 2nd Rib, Plasma Cell Myeloma    FAMILY HISTORY: Family History  Problem Relation Age of Onset  . Dementia Mother   . Hypertension Mother   . Cancer Father     lung; +tobacco  . Diabetes Sister   . Cancer Brother   . Diabetes Sister          PHYSICAL EXAM  Filed Vitals:   02/08/15 0843  BP: 145/81  Pulse: 66  Height: _0  (1.575 m)  Weight: 211 lb (95.709 kg)   Body mass index is 38.58 kg/(m^2).  Generalized: Well developed, in no acute distress   Neurological examination  Mentation: Alert oriented to time, place, history taking. Follows all commands speech and language fluent Cranial nerve II-XII: Pupils were equal round reactive to light. Extraocular movements were full, visual field were full on  confrontational test. Facial sensation and strength were normal. Uvula tongue midline. Head turning and shoulder shrug  were normal and symmetric. Motor: The motor testing reveals 5 over 5 strength of all 4 extremities. Good symmetric motor tone is noted throughout.  Sensory: Sensory testing is intact to soft touch on all 4 extremities. No evidence of extinction is noted.  Coordination: Cerebellar testing reveals good finger-nose-finger and heel-to-shin bilaterally.  Gait and station: Gait is normal. Tandem gait is normal. Romberg is negative. No drift is seen.  Reflexes: Deep tendon reflexes are symmetric  and normal bilaterally.    DIAGNOSTIC DATA (LABS, IMAGING, TESTING) - I reviewed patient records, labs, notes, testing and imaging myself where available.  Lab Results  Component Value Date   WBC 5.5 01/31/2015   HGB 11.8 01/31/2015   HCT 35.4 01/31/2015   MCV 96 01/31/2015   PLT 187 01/31/2015      Component Value Date/Time   NA 143 01/31/2015 0930   NA 140 08/12/2014 1148   NA 140 01/28/2013 1421   K 3.6 01/31/2015 0930   K 4.1 08/12/2014 1148   CL 112* 01/31/2015 0930   CL 104 08/12/2014 1148   CO2 29 01/31/2015 0930   CO2 26 08/12/2014 1148   GLUCOSE 90 01/31/2015 0930   GLUCOSE 82 08/12/2014 1148   GLUCOSE 92 01/28/2013 1421   BUN 14 01/31/2015 0930   BUN 17 08/12/2014 1148   BUN 20 01/28/2013 1421   CREATININE 0.8 01/31/2015 0930   CREATININE 0.70 11/30/2013 0908   CALCIUM 9.0 01/31/2015 0930   CALCIUM 8.8 08/12/2014 1148   PROT 7.2 01/31/2015 0930   PROT 6.9 08/12/2014 1148   PROT 6.9 01/28/2013 1421   ALBUMIN 3.7 08/12/2014 1148   AST 38 01/31/2015 0930   AST 24 08/12/2014 1148   ALT 31 01/31/2015 0930   ALT 20 08/12/2014 1148   ALKPHOS 68 01/31/2015 0930   ALKPHOS 65 08/12/2014 1148   BILITOT 0.80 01/31/2015 0930   BILITOT 0.4 08/12/2014 1148   GFRNONAA >90 02/23/2013 1237   GFRAA >90 02/23/2013 1237   Lab Results  Component Value Date   CHOL 178  08/12/2014   HDL 69 08/12/2014   LDLCALC 96 08/12/2014   TRIG 67 08/12/2014   CHOLHDL 2.6 08/12/2014   No results found for: HGBA1C Lab Results  Component Value Date   VITAMINB12 520 08/12/2014       ASSESSMENT AND PLAN 64 y.o. 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 here with:  1. Multiple sclerosis  Overall the patient is doing well. She'll continue Betaseron. Patient recently had blood work which was unremarkable. We'll reconsider an MRI in the future. If symptoms worsen or she develops new symptoms she should let us know. Follow-up in 6 months or sooner if needed.   Ward Givens, MSN, NP-C 02/08/2015, 9:06 AM [facility] [address], [place], [place] [address] [phone]  Note: This document was prepared with digital dictation and possible smart phrase technology. Any transcriptional errors that result from this process are unintentional.

## 2015-02-08 NOTE — Progress Notes (Signed)
I have read the note, and I agree with the clinical assessment and plan.  Nohemy Koop KEITH   

## 2015-03-11 ENCOUNTER — Ambulatory Visit (INDEPENDENT_AMBULATORY_CARE_PROVIDER_SITE_OTHER): Payer: BC Managed Care – PPO | Admitting: Physician Assistant

## 2015-03-11 VITALS — BP 120/70 | HR 69 | Temp 98.2°F | Resp 16 | Ht 62.5 in | Wt 213.0 lb

## 2015-03-11 DIAGNOSIS — B3789 Other sites of candidiasis: Secondary | ICD-10-CM

## 2015-03-11 DIAGNOSIS — R21 Rash and other nonspecific skin eruption: Secondary | ICD-10-CM | POA: Diagnosis not present

## 2015-03-11 DIAGNOSIS — E538 Deficiency of other specified B group vitamins: Secondary | ICD-10-CM

## 2015-03-11 DIAGNOSIS — B3749 Other urogenital candidiasis: Secondary | ICD-10-CM

## 2015-03-11 LAB — POCT SKIN KOH: Skin KOH, POC: POSITIVE

## 2015-03-11 MED ORDER — CYANOCOBALAMIN 1000 MCG/ML IJ SOLN
1000.0000 ug | INTRAMUSCULAR | Status: DC
  Administered 2015-03-11: 1000 ug via INTRAMUSCULAR

## 2015-03-11 MED ORDER — NYSTATIN 100000 UNIT/GM EX CREA: 1.0000 "application " | TOPICAL_CREAM | Freq: Two times a day (BID) | CUTANEOUS | Status: DC

## 2015-03-11 NOTE — Progress Notes (Signed)
   Subjective:    Patient ID: Winefred Hillesheim, female    DOB: 1951-03-26, 64 y.o.   MRN: [id number]  HPI Patient present for rash that has been present under both breast and near groin for past week. States that she received some clothes from her neighbor and noticed rash after that. Has been putting OTC itch cream which relieves itching, but hasn't made rash go away. Areas are sometimes painful, but mostly just uncomfortable. Denies fever. States area of redness may have increased in size.   Would like to get B12 shot today. Has been getting for several years. Told that she didn't have enough when she was having leg cramping. Denies fatigue.  Allergies  Allergen Reactions  . Celebrex [Celecoxib]   . Dristan Other (See Comments)    "made my chest tight"  . Robitussin (Alcohol Free) [Guaifenesin] Other (See Comments)    "made my chest tight"  . Ropinirole Hcl Other (See Comments)    "made my chest tight"  . Tramadol Other (See Comments)    "made my chest tight"   Review of Systems  Constitutional: Negative for fever, chills and fatigue.  Skin: Positive for color change and rash. Negative for wound.       Objective:   Physical Exam  Constitutional: She is oriented to person, place, and time. She appears well-developed and well-nourished. No distress.  Blood pressure 120/70, pulse 69, temperature 98.2 F (36.8 C), temperature source Oral, resp. rate 16, height 5' 2.5" (1.588 m), weight 213 lb (96.616 kg), SpO2 98 %.  HENT:  Head: Normocephalic and atraumatic.  Right Ear: External ear normal.  Left Ear: External ear normal.  Eyes: Conjunctivae are normal. Right eye exhibits no discharge. Left eye exhibits no discharge. No scleral icterus.  Pulmonary/Chest: Effort normal.  Neurological: She is alert and oriented to person, place, and time.  Skin: Skin is warm and dry. Rash (candida-like appearance) noted. She is not diaphoretic. There is erythema. No pallor.     Psychiatric: She has a  normal mood and affect. Her behavior is normal. Judgment and thought content normal.   Results for orders placed or performed in visit on 03/11/15  POCT Skin KOH  Result Value Ref Range   Skin KOH, POC Positive       Assessment & Plan:  1. Candidiasis of breast 2. Genital candidiasis 3. Rash and nonspecific skin eruption Can additionally use gold bond powder in affected areas careful not to get powder in or near the vagina just on the leg and groin. Keep area dry. - POCT Skin KOH - nystatin cream (MYCOSTATIN); Apply 1 application topically 2 (two) times daily.  Dispense: 30 g; Refill: 0  4. B12 deficiency - cyanocobalamin ((VITAMIN B-12)) injection 1,000 mcg; Inject 1 mL (1,000 mcg total) into the muscle every 30 (thirty) days.   Alveta Heimlich PA-C  [facility] 03/11/2015 10:42 AM

## 2015-03-16 ENCOUNTER — Other Ambulatory Visit: Payer: Self-pay

## 2015-03-16 MED ORDER — INTERFERON BETA-1B 0.3 MG ~~LOC~~ KIT: 0.2500 mg | PACK | SUBCUTANEOUS | Status: DC

## 2015-03-19 ENCOUNTER — Ambulatory Visit (INDEPENDENT_AMBULATORY_CARE_PROVIDER_SITE_OTHER): Payer: BC Managed Care – PPO | Admitting: Physician Assistant

## 2015-03-19 VITALS — BP 128/88 | HR 84 | Temp 98.3°F | Resp 18 | Ht 61.5 in | Wt 210.0 lb

## 2015-03-19 DIAGNOSIS — G35 Multiple sclerosis: Secondary | ICD-10-CM

## 2015-03-19 DIAGNOSIS — B3789 Other sites of candidiasis: Secondary | ICD-10-CM | POA: Diagnosis not present

## 2015-03-19 DIAGNOSIS — B3749 Other urogenital candidiasis: Secondary | ICD-10-CM

## 2015-03-19 DIAGNOSIS — C9000 Multiple myeloma not having achieved remission: Secondary | ICD-10-CM | POA: Diagnosis not present

## 2015-03-19 MED ORDER — TERBINAFINE HCL 250 MG PO TABS: 250.0000 mg | ORAL_TABLET | Freq: Every day | ORAL | Status: AC

## 2015-03-19 NOTE — Progress Notes (Signed)
[facility] [address] [place] [address] [phone]  Date:  03/19/2015   Name:  Morgan Espinoza   DOB:  04-10-1951   MRN:  [id number]  PCP:  Jenny Reichmann, MD    Chief Complaint: Rash   History of Present Illness:  This is a 64 y.o. female with PMH MS, myeloma, HTN, HLD, b12 deficiency, iron def anemia who is presenting with a rash x 9 days. Rash located under bilateral breasts and bilateral groin and gluteal cleft. States rash started after receiving clothes from a neighbor. She was seen here 1 week ago when it started. She was dx'd with candidiasis and prescribed nystatin cream BID. She was told to apply gold bond and try to keep dry. Pt reports the rash under her breasts have improved. The rash is her groin has not gotten any better and is very itchy. She denies vaginal discharge or dysuria. She denies fever or chills. No hx DM, kidney or liver problems. Recent CMP normal. She goes every 3 months for myeloma treatment. She does self injections for MS.   Review of Systems:  Review of Systems See HPI  Patient Active Problem List   Diagnosis Date Noted  . Primary osteoarthritis of both knees 10/12/2014  . B12 deficiency 10/12/2014  . Encounter for long-term (current) use of other medications 01/28/2013  . 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 Anemia   . Hyperlipidemia   . Vertigo   . Vitamin D deficiency   . Obesity   . Achilles tendonitis   . Anemia, iron deficiency 07/23/2012  . 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 Cancer 05/11/2012  . Rib lesion 04/28/2012  . Chest pain 06/05/2011  . Hypertension 06/05/2011  . Asthma 06/05/2011  . GERD (gastroesophageal reflux disease) 06/05/2011  . MS (multiple sclerosis) 06/05/2011    Prior to Admission medications   Medication Sig Start Date End Date Taking? Authorizing Provider  acetaminophen (TYLENOL) 325 MG tablet Take 650 mg by mouth every 6 (six) hours as needed.   Yes Historical Provider, MD  albuterol (PROVENTIL  HFA;VENTOLIN HFA) 108 (90 BASE) MCG/ACT inhaler Inhale 2 puffs into the lungs every 6 (six) hours as needed for wheezing or shortness of breath. 11/07/14  Yes Orma Flaming, MD  amLODipine (NORVASC) 10 MG tablet Take 1 tablet (10 mg total) by mouth daily. 09/16/14  Yes Leandrew Koyanagi, MD         aspirin 325 MG tablet Take 325 mg by mouth daily.   Yes Historical Provider, MD  atorvastatin (LIPITOR) 10 MG tablet Take 10 mg by mouth daily.   Yes Historical Provider, MD  CARAFATE 1 GM/10ML suspension TAKE  10 ML BY MOUTH EVERY 6 HOURS 11/03/14  Yes Darlyne Russian, MD         docusate sodium (COLACE) 100 MG capsule Take 100 mg by mouth as needed for constipation.   Yes Historical Provider, MD  ferrous sulfate 325 (65 FE) MG tablet take 1 tablet by mouth once daily WITH BREAKFAST 09/19/14  Yes Wardell Honour, MD                HYDROcodone-acetaminophen (NORCO/VICODIN) 5-325 MG per tablet Take 1 tablet by mouth every 6 (six) hours as needed. 12/26/14  Yes Orma Flaming, MD  Interferon Beta-1b (BETASERON) 0.3 MG KIT injection Inject 0.25 mg into the skin every other day. 03/16/15  Yes Marcial Pacas, MD  losartan (COZAAR) 50 MG tablet Take 1 tablet (50  mg total) by mouth daily. 09/28/14  Yes Darlyne Russian, MD  metoprolol succinate (TOPROL-XL) 25 MG 24 hr tablet Take 25 mg by mouth daily.  04/04/13  Yes Historical Provider, MD  nystatin cream (MYCOSTATIN) Apply 1 application topically 2 (two) times daily. 03/11/15  Yes Tishira R Brewington, PA-C  omeprazole (PRILOSEC) 20 MG capsule TAKE ONE CAPSULE BY MOUTH ONCE DAILY 12/07/14  Yes Orma Flaming, MD  ranitidine (ZANTAC) 150 MG tablet TAKE ONE TABLET BY MOUTH TWICE DAILY 11/13/14  Yes Tereasa Coop, PA-C  VOLTAREN 1 % GEL apply 2 grams to THE LEFT KNEE three times a day 09/07/14  Yes Darlyne Russian, MD    Allergies  Allergen Reactions  . Celebrex [Celecoxib]   . Dristan Other (See Comments)    "made my chest tight"  . Robitussin (Alcohol Free) [Guaifenesin] Other  (See Comments)    "made my chest tight"  . Ropinirole Hcl Other (See Comments)    "made my chest tight"  . Tramadol Other (See Comments)    "made my chest tight"    Past Surgical History  Procedure Laterality Date  . Cholecystectomy      lap choley  . Abdominal hysterectomy      fibroids  . Breast surgery      left breast biopsy-benign  . Rotator cuff repair    . Bone biopsy  05/17/12    Left 2nd Rib, Plasma Cell Myeloma    History  Substance Use Topics  . Smoking status: Former Smoker -- 0.10 packs/day for 2 years    Types: Cigarettes    Start date: 10/05/1976    Quit date: 04/19/1979  . Smokeless tobacco: Never Used     Comment: quit 35 years ago   . Alcohol Use: No    Family History  Problem Relation Age of Onset  . Dementia Mother   . Hypertension Mother   . Cancer Father     lung; +tobacco  . Diabetes Sister   . Cancer Brother   . Diabetes Sister     Medication list has been reviewed and updated.  Physical Examination:  Physical Exam  Constitutional: She is oriented to person, place, and time. She appears well-developed and well-nourished. No distress.  HENT:  Head: Normocephalic and atraumatic.  Right Ear: Hearing normal.  Left Ear: Hearing normal.  Nose: Nose normal.  Eyes: Conjunctivae and lids are normal. Right eye exhibits no discharge. Left eye exhibits no discharge. No scleral icterus.  Cardiovascular: Normal rate, regular rhythm, normal heart sounds and normal pulses.   No murmur heard. Pulmonary/Chest: Effort normal and breath sounds normal. No respiratory distress. She has no wheezes. She has no rhonchi. She has no rales.  Musculoskeletal: Normal range of motion.  Neurological: She is alert and oriented to person, place, and time. She has normal reflexes.  Skin: Skin is warm, dry and intact.  Erythematous well demarcated rash under both breasts, under pannus, in umbilicus, in groin folds and at the top of her gluteal cleft. Several satellite  lesions present. White powder present in all rash locations.  Psychiatric: She has a normal mood and affect. Her speech is normal and behavior is normal. Thought content normal.   BP 128/88 mmHg  Pulse 84  Temp(Src) 98.3 F (36.8 C) (Oral)  Resp 18  Ht 5' 1.5" (1.562 m)  Wt 210 lb (95.255 kg)  BMI 39.04 kg/m2  SpO2 97%  Assessment and Plan:  1. Candidiasis of breast 2. Genital candidiasis  3. MS 4. Plasma cell myeloam Pt likely needs oral antifungal treatment as she is immunocompromised from MS and myeloma. She will stop nystatin and start terbinafine QD x 14 days. Continue gold bond but not in genital region. Advised she carry an extra pair of underwear and change if she feels damp. Return in 2 weeks if symptoms not yet improved. - terbinafine (LAMISIL) 250 MG tablet; Take 1 tablet (250 mg total) by mouth daily.  Dispense: 14 tablet; Refill: 0   Benjaman Pott. Drenda Freeze, MHS [facility]  03/19/2015

## 2015-03-19 NOTE — Patient Instructions (Signed)
Take once a day for 2 weeks. Can cause a little nausea.  Can continue gold bond to keep dry. Keep a change of underwear with you to change if you get damp. Return if not better in 2 weeks.

## 2015-04-05 ENCOUNTER — Telehealth: Payer: Self-pay | Admitting: Emergency Medicine

## 2015-04-05 NOTE — Telephone Encounter (Signed)
Patient needs to have an excuse written that she is unable to perform jury duty due to her diagnosis of MS.

## 2015-04-05 NOTE — Telephone Encounter (Signed)
Patient is requesting a note excusing her from jury duty. Asked patient what condition would prevent her from attending jury duty, she states that she is just not well enough to do it.   [phone]

## 2015-04-06 ENCOUNTER — Ambulatory Visit (INDEPENDENT_AMBULATORY_CARE_PROVIDER_SITE_OTHER): Payer: BC Managed Care – PPO | Admitting: Emergency Medicine

## 2015-04-06 VITALS — BP 130/80 | HR 77 | Temp 98.7°F | Resp 16 | Ht 62.0 in | Wt 209.6 lb

## 2015-04-06 DIAGNOSIS — J45901 Unspecified asthma with (acute) exacerbation: Secondary | ICD-10-CM

## 2015-04-06 DIAGNOSIS — B372 Candidiasis of skin and nail: Secondary | ICD-10-CM

## 2015-04-06 LAB — POCT SKIN KOH: Skin KOH, POC: POSITIVE

## 2015-04-06 MED ORDER — ALBUTEROL SULFATE (2.5 MG/3ML) 0.083% IN NEBU: 2.5000 mg | INHALATION_SOLUTION | Freq: Once | RESPIRATORY_TRACT | Status: DC

## 2015-04-06 MED ORDER — AZITHROMYCIN 250 MG PO TABS: ORAL_TABLET | ORAL | Status: DC

## 2015-04-06 MED ORDER — PREDNISONE 10 MG PO TABS: ORAL_TABLET | ORAL | Status: DC

## 2015-04-06 NOTE — Progress Notes (Addendum)
Patient ID: Morgan Espinoza, female   DOB: November 17, 1950, 64 y.o.   MRN: [id number]    This chart was scribed for Morgan Jordan, MD by Morgan Espinoza, medical scribe at [facility].The patient was seen in exam room 01 and the patient's care was started at 10:27 AM.  Chief Complaint:  Chief Complaint  Patient presents with  . Laryngitis    x this morning  . Rash    x 2 months    HPI: Morgan Espinoza is a 64 y.o. female who reports to Morgan Espinoza today complaining of a persistent generalized rash ongoing for two months. The rash is under bilateral breast, bilateral groin and the gluteal cleft.The rash does itch and is very dry. Seen here on 03/19/2015 for this rash. Suspected yeast infection and treated with terbinafine 250 mg daily. Also tried a otc topical cream for the itching. She also complains of laryngitis, onset this morning, this is a recurrent yearly issue. She attributes this to seasonal allergies. Associated headache and wheezing. She has an albuterol inhaler, which is used on an as needed basis.   Past Medical History  Diagnosis Date  . Hypertension   . GERD (gastroesophageal reflux disease)   . Asthma   . Multiple sclerosis   . Hyperlipidemia   . Vertigo   . Vitamin D deficiency   . Obesity   . Achilles tendonitis   . OA (osteoarthritis) of knee   . Anemia, iron deficiency 07/23/2012  . 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 Cancer 05/2012    plasma cell myeloma  . S/P radiation therapy 08/12/12 - 09/21/12    left Anterior 2nd Rib Lesion / 50.4 GY / 28 Fractions   Past Surgical History  Procedure Laterality Date  . Cholecystectomy      lap choley  . Abdominal hysterectomy      fibroids  . Breast surgery      left breast biopsy-benign  . Rotator cuff repair    . Bone biopsy  05/17/12    Left 2nd Rib, Plasma Cell Myeloma   Social History   Social History  . Marital Status: Widowed    Spouse Name: N/A  . Number of Children: 1  . Years of Education: 12    Occupational History  .    [employer] Retired     Social History Main Topics  . Smoking status: Former Smoker -- 0.10 packs/day for 2 years    Types: Cigarettes    Start date: 10/05/1976    Quit date: 04/19/1979  . Smokeless tobacco: Never Used     Comment: quit 35 years ago   . Alcohol Use: No  . Drug Use: No  . Sexual Activity: No   Other Topics Concern  . None   Social History Narrative   Retired: Bus Driver for 28 years   Patient lives at home alone.    Patient is retired.    Patient is widowed.    Patient is right handed.    Patient has a high school education.    Patient has 1 child.    Family History  Problem Relation Age of Onset  . Dementia Mother   . Hypertension Mother   . Cancer Father     lung; +tobacco  . Diabetes Sister   . Cancer Brother   . Diabetes Sister    Allergies  Allergen Reactions  . Celebrex [Celecoxib]   . Dristan Other (See Comments)    "  made my chest tight"  . Robitussin (Alcohol Free) [Guaifenesin] Other (See Comments)    "made my chest tight"  . Ropinirole Hcl Other (See Comments)    "made my chest tight"  . Tramadol Other (See Comments)    "made my chest tight"   Prior to Admission medications   Medication Sig Start Date End Date Taking? Authorizing Espinoza  acetaminophen (TYLENOL) 325 MG tablet Take 650 mg by mouth every 6 (six) hours as needed.   Yes Morgan Provider, MD  albuterol (PROVENTIL HFA;VENTOLIN HFA) 108 (90 BASE) MCG/ACT inhaler Inhale 2 puffs into the lungs every 6 (six) hours as needed for wheezing or shortness of breath. 11/07/14  Yes Morgan Flaming, MD  amLODipine (NORVASC) 10 MG tablet Take 1 tablet (10 mg total) by mouth daily. 09/16/14  Yes Morgan Koyanagi, MD  aspirin 325 MG tablet Take 325 mg by mouth daily.   Yes Morgan Provider, MD  atorvastatin (LIPITOR) 10 MG tablet Take 10 mg by mouth daily.   Yes Morgan Provider, MD  CARAFATE 1 GM/10ML suspension TAKE  10 ML BY MOUTH EVERY 6 HOURS 11/03/14  Yes  Morgan Russian, MD  ferrous sulfate 325 (65 FE) MG tablet take 1 tablet by mouth once daily WITH BREAKFAST 09/19/14  Yes Morgan Honour, MD  HYDROcodone-acetaminophen (NORCO/VICODIN) 5-325 MG per tablet Take 1 tablet by mouth every 6 (six) hours as needed. 12/26/14  Yes Morgan Flaming, MD  Interferon Beta-1b (BETASERON) 0.3 MG KIT injection Inject 0.25 mg into the skin every other day. 03/16/15  Yes Morgan Pacas, MD  metoprolol succinate (TOPROL-XL) 25 MG 24 hr tablet Take 25 mg by mouth daily.  04/04/13  Yes Morgan Provider, MD  omeprazole (PRILOSEC) 20 MG capsule TAKE ONE CAPSULE BY MOUTH ONCE DAILY 12/07/14  Yes Morgan Flaming, MD  ranitidine (ZANTAC) 150 MG tablet TAKE ONE TABLET BY MOUTH TWICE DAILY 11/13/14  Yes Morgan Coop, PA-C  docusate sodium (COLACE) 100 MG capsule Take 100 mg by mouth as needed for constipation.    Morgan Provider, MD  losartan (COZAAR) 50 MG tablet Take 1 tablet (50 mg total) by mouth daily. Patient not taking: Reported on 04/06/2015 09/28/14   Morgan Russian, MD  nystatin cream (MYCOSTATIN) Apply 1 application topically 2 (two) times daily. Patient not taking: Reported on 04/06/2015 03/11/15   Morgan R Brewington, PA-C  VOLTAREN 1 % GEL apply 2 grams to THE LEFT KNEE three times a day Patient not taking: Reported on 04/06/2015 09/07/14   Morgan Russian, MD   ROS: The patient denies fevers, chills, night sweats, unintentional weight loss, chest pain, palpitations, wheezing, dyspnea on exertion, nausea, vomiting, abdominal pain, dysuria, hematuria, melena, numbness, weakness, or tingling.  All other systems have been reviewed and were otherwise negative with the exception of those mentioned in the HPI and as above.    PHYSICAL EXAM: Filed Vitals:   04/06/15 0957  BP: 130/80  Pulse: 77  Temp: 98.7 F (37.1 C)  Resp: 16   Body mass index is 38.33 kg/(m^2).  General: Alert, no acute distress HEENT:  Normocephalic, atraumatic, oropharynx patent. Eye: Morgan Espinoza  Morgan Espinoza Cardiovascular:  Regular rate and rhythm, no rubs murmurs or gallops.  No Carotid bruits, radial pulse intact. No pedal edema.  Respiratory: Diffuse expiratory wheezes bilaterally. Abdominal: No organomegaly, abdomen is soft and non-tender, positive bowel sounds.  No masses. Musculoskeletal: Gait intact. No edema, tenderness Skin: Dry, scaly rash below both breast and the groin  area. Neurologic: Facial musculature symmetric. Psychiatric: Patient acts appropriately throughout our interaction. Lymphatic: No cervical or submandibular lymphadenopathy Genitourinary/Anorectal: No acute findings  LABS: Results for orders placed or performed in visit on 03/11/15  POCT Skin KOH  Result Value Ref Range   Skin KOH, POC Positive    ASSESSMENT/PLAN: Patient has significant wheezing. She did improve after a nebulizer treatment. Will treat with a short taper of prednisone. She already has an albuterol inhaler. She was advised to take Zyrtec or Claritin 1 a day. The rash she has looks like yeast but is also somewhat tender in the groin  area systemic disease. I suspect this is erythrasma. Will treat with Zithromax. She was also given a low dose of prednisone to help with her asthma and has her inhaler to use.personally performed the services described in this documentation, which was scribed in my presence. The recorded information has been reviewed and is accurate. Recheck next Wednesday.   [name] 04/06/2015 10:27 AM

## 2015-04-06 NOTE — Patient Instructions (Signed)

## 2015-04-09 NOTE — Telephone Encounter (Signed)
Pt came in yesterday

## 2015-04-11 ENCOUNTER — Ambulatory Visit (INDEPENDENT_AMBULATORY_CARE_PROVIDER_SITE_OTHER): Payer: BC Managed Care – PPO | Admitting: Emergency Medicine

## 2015-04-11 VITALS — BP 118/64 | HR 74 | Temp 98.4°F | Resp 16 | Ht 62.0 in | Wt 210.8 lb

## 2015-04-11 DIAGNOSIS — B372 Candidiasis of skin and nail: Secondary | ICD-10-CM | POA: Diagnosis not present

## 2015-04-11 DIAGNOSIS — J040 Acute laryngitis: Secondary | ICD-10-CM | POA: Diagnosis not present

## 2015-04-11 DIAGNOSIS — L081 Erythrasma: Secondary | ICD-10-CM

## 2015-04-11 NOTE — Progress Notes (Addendum)
Subjective:  This chart was scribed for Morgan Espinoza, by Morgan Espinoza, at [facility].  This patient was seen in room  2 and the patient's care was started at 9:03 AM.    Patient ID: Morgan Espinoza, female    DOB: [birth year], 64 y.o.   MRN: [id number] Chief Complaint  Patient presents with  . Follow-up    rash on her abdominal area, as well as under both breast    HPI  HPI Comments: Morgan Espinoza is a 64 y.o. female who [facility] for a follow up regarding a  significant intertriginous rash which was felt to represent erythrasma.  She was treated with a z- pack and enters today for a re-check.   She states that the rash under her breasts and gluteal cleft area are drying up and seems to be doing much better (does not itch like it was in her prior visits.)    Laryngitis: Patient still does not have her full voice back and states that happens to her every year.  She feels like the cough that she has is stronger this year than in the past.  She denies a sore throat associated with her loss of voice.  She has not used her inhaler "that much"     Patient Active Problem List   Diagnosis Date Noted  . Primary osteoarthritis of both knees 10/12/2014  . B12 deficiency 10/12/2014  . Encounter for long-term (current) use of other medications 01/28/2013  . 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 Anemia   . Hyperlipidemia   . Vertigo   . Vitamin D deficiency   . Obesity   . Achilles tendonitis   . Anemia, iron deficiency 07/23/2012  . 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 Cancer 05/11/2012  . Rib lesion 04/28/2012  . Chest pain 06/05/2011  . Hypertension 06/05/2011  . Asthma 06/05/2011  . GERD (gastroesophageal reflux disease) 06/05/2011  . MS (multiple sclerosis) 06/05/2011   Past Medical History  Diagnosis Date  . Hypertension   . GERD (gastroesophageal reflux disease)   . Asthma   . Multiple sclerosis   . Hyperlipidemia   . Vertigo     . Vitamin D deficiency   . Obesity   . Achilles tendonitis   . OA (osteoarthritis) of knee   . Anemia, iron deficiency 07/23/2012  . 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 Cancer 05/2012    plasma cell myeloma  . S/P radiation therapy 08/12/12 - 09/21/12    left Anterior 2nd Rib Lesion / 50.4 GY / 28 Fractions   Past Surgical History  Procedure Laterality Date  . Cholecystectomy      lap choley  . Abdominal hysterectomy      fibroids  . Breast surgery      left breast biopsy-benign  . Rotator cuff repair    . Bone biopsy  05/17/12    Left 2nd Rib, Plasma Cell Myeloma   Allergies  Allergen Reactions  . Celebrex [Celecoxib]   . Dristan Other (See Comments)    "made my chest tight"  . Robitussin (Alcohol Free) [Guaifenesin] Other (See Comments)    "made my chest tight"  . Ropinirole Hcl Other (See Comments)    "made my chest tight"  . Tramadol Other (See Comments)    "made my chest tight"   Prior to Admission medications   Medication Sig Start Date End Date Taking? Authorizing Provider  acetaminophen (  TYLENOL) 325 MG tablet Take 650 mg by mouth every 6 (six) hours as needed.   Yes Historical Provider, Espinoza  albuterol (PROVENTIL HFA;VENTOLIN HFA) 108 (90 BASE) MCG/ACT inhaler Inhale 2 puffs into the lungs every 6 (six) hours as needed for wheezing or shortness of breath. 11/07/14  Yes Orma Flaming, Espinoza  amLODipine (NORVASC) 10 MG tablet Take 1 tablet (10 mg total) by mouth daily. 09/16/14  Yes Leandrew Koyanagi, Espinoza  aspirin 325 MG tablet Take 325 mg by mouth daily.   Yes Historical Provider, Espinoza  atorvastatin (LIPITOR) 10 MG tablet Take 10 mg by mouth daily.   Yes Historical Provider, Espinoza  azithromycin (ZITHROMAX) 250 MG tablet Take 2 tabs PO x 1 dose, then 1 tab PO QD x 4 days 04/06/15  Yes Darlyne Russian, Espinoza  CARAFATE 1 GM/10ML suspension TAKE  10 ML BY MOUTH EVERY 6 HOURS 11/03/14  Yes Darlyne Russian, Espinoza  docusate sodium (COLACE) 100 MG capsule Take 100 mg by mouth as needed  for constipation.   Yes Historical Provider, Espinoza  ferrous sulfate 325 (65 FE) MG tablet take 1 tablet by mouth once daily WITH BREAKFAST 09/19/14  Yes Wardell Honour, Espinoza  HYDROcodone-acetaminophen (NORCO/VICODIN) 5-325 MG per tablet Take 1 tablet by mouth every 6 (six) hours as needed. 12/26/14  Yes Orma Flaming, Espinoza  Interferon Beta-1b (BETASERON) 0.3 MG KIT injection Inject 0.25 mg into the skin every other day. 03/16/15  Yes Marcial Pacas, Espinoza  metoprolol succinate (TOPROL-XL) 25 MG 24 hr tablet Take 25 mg by mouth daily.  04/04/13  Yes Historical Provider, Espinoza  omeprazole (PRILOSEC) 20 MG capsule TAKE ONE CAPSULE BY MOUTH ONCE DAILY 12/07/14  Yes Orma Flaming, Espinoza  predniSONE (DELTASONE) 10 MG tablet Take 3 a day for 3 days 2 a day for 3 days one a day for 3 days 04/06/15  Yes Darlyne Russian, Espinoza  ranitidine (ZANTAC) 150 MG tablet TAKE ONE TABLET BY MOUTH TWICE DAILY 11/13/14  Yes Tereasa Coop, PA-C  losartan (COZAAR) 50 MG tablet Take 1 tablet (50 mg total) by mouth daily. Patient not taking: Reported on 04/06/2015 09/28/14   Darlyne Russian, Espinoza  nystatin cream (MYCOSTATIN) Apply 1 application topically 2 (two) times daily. Patient not taking: Reported on 04/06/2015 03/11/15   Tishira R Brewington, PA-C  VOLTAREN 1 % GEL apply 2 grams to THE LEFT KNEE three times a day Patient not taking: Reported on 04/06/2015 09/07/14   Darlyne Russian, Espinoza   Social History   Social History  . Marital Status: Widowed    Spouse Name: N/A  . Number of Children: 1  . Years of Education: 12   Occupational History  .    [employer] Retired     Social History Main Topics  . Smoking status: Former Smoker -- 0.10 packs/day for 2 years    Types: Cigarettes    Start date: 10/05/1976    Quit date: 04/19/1979  . Smokeless tobacco: Never Used     Comment: quit 35 years ago   . Alcohol Use: No  . Drug Use: No  . Sexual Activity: No   Other Topics Concern  . Not on file   Social History Narrative   Retired: Bus Driver for 28 years    Patient lives at home alone.    Patient is retired.    Patient is widowed.    Patient is right handed.    Patient has a high  school education.    Patient has 1 child.     Review of Systems  Constitutional: Negative for fever and chills.  HENT: Positive for voice change. Negative for sore throat.   Eyes: Negative for pain and itching.  Respiratory: Positive for cough. Negative for choking and shortness of breath.   Gastrointestinal: Negative for nausea and vomiting.  Musculoskeletal: Negative for neck pain and neck stiffness.  Skin: Positive for rash.       Objective:   Physical Exam  CONSTITUTIONAL: Well developed/well nourished HEAD: Normocephalic/atraumatic EYES: EOMI/PERRL ENMT: Mucous membranes moist NECK: supple no meningeal signs CV: S1/S2 noted, no murmurs/rubs/gallops noted LUNGS: Lungs are clear to auscultation bilaterally, no apparent distress-- very hoarse voice.  ABDOMEN: soft, nontender, no rebound or guarding, bowel sounds noted throughout abdomen NEURO: Pt is awake/alert/appropriate, moves all extremitiesx4.  No facial droop.   SKIN: lShe has a resolving scaly maculopapular rash present beneath both breasts and also in her groin area.  The redness around the rash which was noted previously has resolved.  PSYCH: no abnormalities of mood noted, alert and oriented to situation     Filed Vitals:   04/11/15 0838  BP: 118/64  Pulse: 74  Temp: 98.4 F (36.9 C)  TempSrc: Oral  Resp: 16  Height: '5\' 2"'  (1.575 m)  Weight: 210 lb 12.8 oz (95.618 kg)  SpO2: 98%       Assessment & Plan:  Her erythrasma is much improved. Her laryngitis is about the same. She will continue prednisone 20 mg a day for 3 days and then 10 mg a day for 3 days I will check her in 1 week.Marland Kitchenscribe I personally performed the services described in this documentation, which was scribed in my presence. The recorded information has been reviewed and is accurate.

## 2015-04-11 NOTE — Patient Instructions (Signed)
Take your prednisone 10 mg 2 a day for 3 days. Then 1 a day for 3 days. see me in one week.

## 2015-04-18 ENCOUNTER — Ambulatory Visit (INDEPENDENT_AMBULATORY_CARE_PROVIDER_SITE_OTHER): Payer: BC Managed Care – PPO | Admitting: Emergency Medicine

## 2015-04-18 VITALS — BP 128/68 | HR 62 | Temp 98.3°F | Resp 16 | Ht 62.0 in | Wt 211.6 lb

## 2015-04-18 DIAGNOSIS — L081 Erythrasma: Secondary | ICD-10-CM | POA: Diagnosis not present

## 2015-04-18 DIAGNOSIS — B372 Candidiasis of skin and nail: Secondary | ICD-10-CM | POA: Diagnosis not present

## 2015-04-18 DIAGNOSIS — R21 Rash and other nonspecific skin eruption: Secondary | ICD-10-CM | POA: Diagnosis not present

## 2015-04-18 LAB — POCT SKIN KOH: SKIN KOH, POC: POSITIVE

## 2015-04-18 MED ORDER — FLUCONAZOLE 150 MG PO TABS: 150.0000 mg | ORAL_TABLET | Freq: Once | ORAL | Status: DC

## 2015-04-18 MED ORDER — MICONAZOLE NITRATE 2 % EX AERP: INHALATION_SPRAY | CUTANEOUS | Status: DC

## 2015-04-18 NOTE — Progress Notes (Addendum)
Patient ID: Morgan Espinoza, female   DOB: 04-28-1951, 64 y.o.   MRN: [id number]    This chart was scribed for Darlyne Russian, MD by Ladene Artist, ED Scribe. The patient was seen in room 11. Patient's care was started at 8:38 AM.  Chief Complaint:  Chief Complaint  Patient presents with  . Follow-up    rash under her breast, on abdominal area    HPI: Morgan Espinoza is a 64 y.o. female, with a h/o HTN and MS, who reports to [facility] today complaining of gradually improving pruritic rash underneath her breast for the past few weeks. Pt reports associated warmth to the area. She denies using topical creams but has been compliant with z-pak which she has 1 more dose of.   Laryngitis Pt reports intermittent hoarseness for the past few days. She reports that hoarseness is exacerbated with being outside. She reports similar symptoms every year.   Multiple Sclerosis  Pt receives her injections for MS every other day. She is followed by Ward Givens, NP with neurology; last seen on 6/30.   Past Medical History  Diagnosis Date  . Hypertension   . GERD (gastroesophageal reflux disease)   . Asthma   . Multiple sclerosis   . Hyperlipidemia   . Vertigo   . Vitamin D deficiency   . Obesity   . Achilles tendonitis   . OA (osteoarthritis) of knee   . Anemia, iron deficiency 07/23/2012  . 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 Cancer 05/2012    plasma cell myeloma  . S/P radiation therapy 08/12/12 - 09/21/12    left Anterior 2nd Rib Lesion / 50.4 GY / 28 Fractions   Past Surgical History  Procedure Laterality Date  . Cholecystectomy      lap choley  . Abdominal hysterectomy      fibroids  . Breast surgery      left breast biopsy-benign  . Rotator cuff repair    . Bone biopsy  05/17/12    Left 2nd Rib, Plasma Cell Myeloma   Social History   Social History  . Marital Status: Widowed    Spouse Name: N/A  . Number of Children: 1  . Years of Education: 12   Occupational History  .      [employer] Retired     Social History Main Topics  . Smoking status: Former Smoker -- 0.10 packs/day for 2 years    Types: Cigarettes    Start date: 10/05/1976    Quit date: 04/19/1979  . Smokeless tobacco: Never Used     Comment: quit 35 years ago   . Alcohol Use: No  . Drug Use: No  . Sexual Activity: No   Other Topics Concern  . None   Social History Narrative   Retired: Bus Driver for 28 years   Patient lives at home alone.    Patient is retired.    Patient is widowed.    Patient is right handed.    Patient has a high school education.    Patient has 1 child.    Family History  Problem Relation Age of Onset  . Dementia Mother   . Hypertension Mother   . Cancer Father     lung; +tobacco  . Diabetes Sister   . Cancer Brother   . Diabetes Sister    Allergies  Allergen Reactions  . Celebrex [Celecoxib]   . Dristan Other (See Comments)    "made my chest tight"  .  Robitussin (Alcohol Free) [Guaifenesin] Other (See Comments)    "made my chest tight"  . Ropinirole Hcl Other (See Comments)    "made my chest tight"  . Tramadol Other (See Comments)    "made my chest tight"   Prior to Admission medications   Medication Sig Start Date End Date Taking? Authorizing Provider  acetaminophen (TYLENOL) 325 MG tablet Take 650 mg by mouth every 6 (six) hours as needed.   Yes Historical Provider, MD  albuterol (PROVENTIL HFA;VENTOLIN HFA) 108 (90 BASE) MCG/ACT inhaler Inhale 2 puffs into the lungs every 6 (six) hours as needed for wheezing or shortness of breath. 11/07/14  Yes Orma Flaming, MD  amLODipine (NORVASC) 10 MG tablet Take 1 tablet (10 mg total) by mouth daily. 09/16/14  Yes Leandrew Koyanagi, MD  aspirin 325 MG tablet Take 325 mg by mouth daily.   Yes Historical Provider, MD  atorvastatin (LIPITOR) 10 MG tablet Take 10 mg by mouth daily.   Yes Historical Provider, MD  CARAFATE 1 GM/10ML suspension TAKE  10 ML BY MOUTH EVERY 6 HOURS 11/03/14  Yes Darlyne Russian, MD  docusate  sodium (COLACE) 100 MG capsule Take 100 mg by mouth as needed for constipation.   Yes Historical Provider, MD  ferrous sulfate 325 (65 FE) MG tablet take 1 tablet by mouth once daily WITH BREAKFAST 09/19/14  Yes Wardell Honour, MD  HYDROcodone-acetaminophen (NORCO/VICODIN) 5-325 MG per tablet Take 1 tablet by mouth every 6 (six) hours as needed. 12/26/14  Yes Orma Flaming, MD  Interferon Beta-1b (BETASERON) 0.3 MG KIT injection Inject 0.25 mg into the skin every other day. 03/16/15  Yes Marcial Pacas, MD  metoprolol succinate (TOPROL-XL) 25 MG 24 hr tablet Take 25 mg by mouth daily.  04/04/13  Yes Historical Provider, MD  omeprazole (PRILOSEC) 20 MG capsule TAKE ONE CAPSULE BY MOUTH ONCE DAILY 12/07/14  Yes Orma Flaming, MD  predniSONE (DELTASONE) 10 MG tablet Take 3 a day for 3 days 2 a day for 3 days one a day for 3 days 04/06/15  Yes Darlyne Russian, MD  ranitidine (ZANTAC) 150 MG tablet TAKE ONE TABLET BY MOUTH TWICE DAILY 11/13/14  Yes Tereasa Coop, PA-C  VOLTAREN 1 % GEL apply 2 grams to THE LEFT KNEE three times a day 09/07/14  Yes Darlyne Russian, MD  azithromycin (ZITHROMAX) 250 MG tablet Take 2 tabs PO x 1 dose, then 1 tab PO QD x 4 days Patient not taking: Reported on 04/18/2015 04/06/15   Darlyne Russian, MD  losartan (COZAAR) 50 MG tablet Take 1 tablet (50 mg total) by mouth daily. Patient not taking: Reported on 04/06/2015 09/28/14   Darlyne Russian, MD  nystatin cream (MYCOSTATIN) Apply 1 application topically 2 (two) times daily. Patient not taking: Reported on 04/06/2015 03/11/15   Tishira R Brewington, PA-C     ROS: The patient denies fevers, chills, night sweats, unintentional weight loss, chest pain, palpitations, wheezing, dyspnea on exertion, nausea, vomiting, abdominal pain, dysuria, hematuria, melena, numbness, weakness, or tingling. + rash, + voice change  All other systems have been reviewed and were otherwise negative with the exception of those mentioned in the HPI and as above.    PHYSICAL  EXAM: Filed Vitals:   04/18/15 0826  BP: 128/68  Pulse: 62  Temp: 98.3 F (36.8 C)  Resp: 16   Body mass index is 38.69 kg/(m^2).   General: Alert, no acute distress HEENT:  Normocephalic, atraumatic,  oropharynx patent. Eye: [name] [facility] Cardiovascular:  Regular rate and rhythm, no rubs murmurs or gallops.  No Carotid bruits, radial pulse intact. No pedal edema.  Respiratory: Clear to auscultation bilaterally.  No wheezes, rales, or rhonchi.  No cyanosis, no use of accessory musculature Abdominal: No organomegaly, abdomen is soft and non-tender, positive bowel sounds.  No masses. Musculoskeletal: Gait intact. No edema, tenderness Skin: No rashes. Neurologic: Facial musculature symmetric. Psychiatric: Patient acts appropriately throughout our interaction. Lymphatic: No cervical or submandibular lymphadenopathy Genitourinary/Anorectal: No acute findings  LABS: Results for orders placed or performed in visit on 04/06/15  POCT Skin KOH  Result Value Ref Range   Skin KOH, POC Positive     EKG/XRAY:   Primary read interpreted by Dr. Everlene Farrier at [facility].   ASSESSMENT/PLAN: Patient was initially treated topically without improvement. She subsequently took a two-week course of Lamisil. She continued to have problems and was treated to cover for erythrasma. She continues to have yeast under the microscope. Will treat with a single dose of Diflucan today repeat in one week. Recheck in 2 weeks she was also given a consult spray.   Gross sideeffects, risk and benefits, and alternatives of medications d/w patient. Patient is aware that all medications have potential sideeffects and we are unable to predict every sideeffect or drug-drug interaction that may occur.  [name] 04/18/2015 8:38 AM

## 2015-04-18 NOTE — Patient Instructions (Signed)

## 2015-05-02 ENCOUNTER — Ambulatory Visit (INDEPENDENT_AMBULATORY_CARE_PROVIDER_SITE_OTHER): Payer: BC Managed Care – PPO | Admitting: Emergency Medicine

## 2015-05-02 VITALS — BP 130/80 | HR 75 | Temp 98.4°F | Resp 16 | Ht 62.0 in | Wt 213.4 lb

## 2015-05-02 DIAGNOSIS — B372 Candidiasis of skin and nail: Secondary | ICD-10-CM | POA: Diagnosis not present

## 2015-05-02 LAB — POCT SKIN KOH: Skin KOH, POC: NEGATIVE

## 2015-05-02 NOTE — Progress Notes (Addendum)
Subjective:  This chart was scribed for [name] MD, by [name], at [facility].  This patient was seen in room 11 and the patient's care was started at 9:16 AM.    Patient ID: Morgan Espinoza, female    DOB: 02-14-1951, 64 y.o.   MRN: [id number] Chief Complaint  Patient presents with  . Follow-up    rash on chest    HPI  HPI Comments: Morgan Espinoza is a 64 y.o. female who presents to the [facility] for a follow up regarding a rash on her chest.  Patient states that it is much better now and does itch as much as it was last week.  Patient was compliant with her cream.  She has no other complaints or concerns today.   Patient Active Problem List   Diagnosis Date Noted  . Primary osteoarthritis of both knees 10/12/2014  . B12 deficiency 10/12/2014  . Encounter for long-term (current) use of other medications 01/28/2013  . 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 Anemia   . Hyperlipidemia   . Vertigo   . Vitamin D deficiency   . Obesity   . Achilles tendonitis   . Anemia, iron deficiency 07/23/2012  . 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 Cancer 05/11/2012  . Rib lesion 04/28/2012  . Chest pain 06/05/2011  . Hypertension 06/05/2011  . Asthma 06/05/2011  . GERD (gastroesophageal reflux disease) 06/05/2011  . MS (multiple sclerosis) 06/05/2011   Past Medical History  Diagnosis Date  . Hypertension   . GERD (gastroesophageal reflux disease)   . Asthma   . Multiple sclerosis   . Hyperlipidemia   . Vertigo   . Vitamin D deficiency   . Obesity   . Achilles tendonitis   . OA (osteoarthritis) of knee   . Anemia, iron deficiency 07/23/2012  . 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 Cancer 05/2012    plasma cell myeloma  . S/P radiation therapy 08/12/12 - 09/21/12    left Anterior 2nd Rib Lesion / 50.4 GY / 28 Fractions   Past Surgical History  Procedure Laterality Date  . Cholecystectomy      lap choley  . Abdominal hysterectomy     fibroids  . Breast surgery      left breast biopsy-benign  . Rotator cuff repair    . Bone biopsy  05/17/12    Left 2nd Rib, Plasma Cell Myeloma   Allergies  Allergen Reactions  . Celebrex [Celecoxib]   . Dristan Other (See Comments)    "made my chest tight"  . Robitussin (Alcohol Free) [Guaifenesin] Other (See Comments)    "made my chest tight"  . Ropinirole Hcl Other (See Comments)    "made my chest tight"  . Tramadol Other (See Comments)    "made my chest tight"   Prior to Admission medications   Medication Sig Start Date End Date Taking? Authorizing Provider  acetaminophen (TYLENOL) 325 MG tablet Take 650 mg by mouth every 6 (six) hours as needed.   Yes Historical Provider, MD  albuterol (PROVENTIL HFA;VENTOLIN HFA) 108 (90 BASE) MCG/ACT inhaler Inhale 2 puffs into the lungs every 6 (six) hours as needed for wheezing or shortness of breath. 11/07/14  Yes Orma Flaming, MD  amLODipine (NORVASC) 10 MG tablet Take 1 tablet (10 mg total) by mouth daily. 09/16/14  Yes Leandrew Koyanagi, MD  aspirin 325 MG tablet Take 325 mg by mouth daily.   Yes  Historical Provider, MD  atorvastatin (LIPITOR) 10 MG tablet Take 10 mg by mouth daily.   Yes Historical Provider, MD  CARAFATE 1 GM/10ML suspension TAKE  10 ML BY MOUTH EVERY 6 HOURS 11/03/14  Yes Darlyne Russian, MD  docusate sodium (COLACE) 100 MG capsule Take 100 mg by mouth as needed for constipation.   Yes Historical Provider, MD  ferrous sulfate 325 (65 FE) MG tablet take 1 tablet by mouth once daily WITH BREAKFAST 09/19/14  Yes Wardell Honour, MD  HYDROcodone-acetaminophen (NORCO/VICODIN) 5-325 MG per tablet Take 1 tablet by mouth every 6 (six) hours as needed. 12/26/14  Yes Orma Flaming, MD  Interferon Beta-1b (BETASERON) 0.3 MG KIT injection Inject 0.25 mg into the skin every other day. 03/16/15  Yes Marcial Pacas, MD  metoprolol succinate (TOPROL-XL) 25 MG 24 hr tablet Take 25 mg by mouth daily.  04/04/13  Yes Historical Provider, MD  Miconazole  Nitrate 2 % AERP Spray to the undersurface of your breasts and in the groin area twice a day 04/18/15  Yes Darlyne Russian, MD  omeprazole (PRILOSEC) 20 MG capsule TAKE ONE CAPSULE BY MOUTH ONCE DAILY 12/07/14  Yes Orma Flaming, MD  predniSONE (DELTASONE) 10 MG tablet Take 3 a day for 3 days 2 a day for 3 days one a day for 3 days 04/06/15  Yes Darlyne Russian, MD  ranitidine (ZANTAC) 150 MG tablet TAKE ONE TABLET BY MOUTH TWICE DAILY 11/13/14  Yes Tereasa Coop, PA-C  VOLTAREN 1 % GEL apply 2 grams to THE LEFT KNEE three times a day 09/07/14  Yes Darlyne Russian, MD  azithromycin (ZITHROMAX) 250 MG tablet Take 2 tabs PO x 1 dose, then 1 tab PO QD x 4 days Patient not taking: Reported on 04/18/2015 04/06/15   Darlyne Russian, MD  fluconazole (DIFLUCAN) 150 MG tablet Take 1 tablet (150 mg total) by mouth once. Repeat if needed Patient not taking: Reported on 05/02/2015 04/18/15   Darlyne Russian, MD  losartan (COZAAR) 50 MG tablet Take 1 tablet (50 mg total) by mouth daily. Patient not taking: Reported on 04/06/2015 09/28/14   Darlyne Russian, MD  nystatin cream (MYCOSTATIN) Apply 1 application topically 2 (two) times daily. Patient not taking: Reported on 04/06/2015 03/11/15   Nolene Bernheim, PA-C   Social History   Social History  . Marital Status: Widowed    Spouse Name: N/A  . Number of Children: 1  . Years of Education: 12   Occupational History  .    [employer] Retired     Social History Main Topics  . Smoking status: Former Smoker -- 0.10 packs/day for 2 years    Types: Cigarettes    Start date: 10/05/1976    Quit date: 04/19/1979  . Smokeless tobacco: Never Used     Comment: quit 35 years ago   . Alcohol Use: No  . Drug Use: No  . Sexual Activity: No   Other Topics Concern  . 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    Patient is retired.    Patient is widowed.    Patient is right handed.    Patient has a high school education.      Patient has 1 child.      Review of Systems  Constitutional: Negative for fever and chills.  Eyes: Negative for pain, discharge, redness and  itching.  Gastrointestinal: Negative for nausea and vomiting.  Musculoskeletal: Negative for neck pain and neck stiffness.  Skin: Positive for rash.       Objective:   Physical Exam Filed Vitals:   05/02/15 0812  BP: 130/80  Pulse: 75  Temp: 98.4 F (36.9 C)  TempSrc: Oral  Resp: 16  Height: '5\' 2"'  (1.575 m)  Weight: 213 lb 6.4 oz (96.798 kg)  SpO2: 98%    CONSTITUTIONAL: Well developed/well nourished HEAD: Normocephalic/atraumatic CV: H2/Z2 noted, no murmurs/rubs/gallops noted LUNGS: Lungs are clear to auscultation bilaterally, no apparent distress ABDOMEN: soft, nontender, no rebound or guarding, bowel sounds noted throughout abdomen GU:no cva tenderness NEURO: Pt is awake/alert/appropriate, moves all extremitiesx4.  No facial droop.   EXTREMITIES: pulses normal/equal, full ROM SKIN: healing intratendinous rash beneath both breasts and in her groin area.  the inflmamtory reaction and redness has resolved. PSYCH: no abnormalities of mood noted, alert and oriented to situation     Assessment & Plan:  Patient doing well. She will continue her spray 3 times a day. She will recheck 1 week. She should get a B12 shot and flu shot at that time.I personally performed the services described in this documentation, which was scribed in my presence. The recorded information has been reviewed and is accurate.

## 2015-05-03 ENCOUNTER — Ambulatory Visit (HOSPITAL_BASED_OUTPATIENT_CLINIC_OR_DEPARTMENT_OTHER): Payer: BC Managed Care – PPO

## 2015-05-03 ENCOUNTER — Encounter: Payer: Self-pay | Admitting: Family

## 2015-05-03 ENCOUNTER — Ambulatory Visit (HOSPITAL_BASED_OUTPATIENT_CLINIC_OR_DEPARTMENT_OTHER): Payer: BC Managed Care – PPO | Admitting: Family

## 2015-05-03 VITALS — BP 135/61 | HR 54 | Temp 97.6°F | Resp 18 | Ht 62.0 in | Wt 211.0 lb

## 2015-05-03 DIAGNOSIS — C9000 Multiple myeloma not having achieved remission: Secondary | ICD-10-CM

## 2015-05-03 DIAGNOSIS — C9030 Solitary plasmacytoma not having achieved remission: Secondary | ICD-10-CM

## 2015-05-03 LAB — CBC WITH DIFFERENTIAL (CANCER CENTER ONLY)
BASO#: 0 10*3/uL (ref 0.0–0.2)
BASO%: 0.2 % (ref 0.0–2.0)
EOS%: 7 % (ref 0.0–7.0)
Eosinophils Absolute: 0.3 10*3/uL (ref 0.0–0.5)
HCT: 35.2 % (ref 34.8–46.6)
HGB: 11.4 g/dL — ABNORMAL LOW (ref 11.6–15.9)
LYMPH#: 1.3 10*3/uL (ref 0.9–3.3)
LYMPH%: 29.5 % (ref 14.0–48.0)
MCH: 31.5 pg (ref 26.0–34.0)
MCHC: 32.4 g/dL (ref 32.0–36.0)
MCV: 97 fL (ref 81–101)
MONO#: 0.4 10*3/uL (ref 0.1–0.9)
MONO%: 9.7 % (ref 0.0–13.0)
NEUT#: 2.4 10*3/uL (ref 1.5–6.5)
NEUT%: 53.6 % (ref 39.6–80.0)
PLATELETS: ADEQUATE 10*3/uL (ref 145–400)
RBC: 3.62 10*6/uL — AB (ref 3.70–5.32)
RDW: 12.2 % (ref 11.1–15.7)
WBC: 4.6 10*3/uL (ref 3.9–10.0)

## 2015-05-03 LAB — CMP (CANCER CENTER ONLY)
ALT(SGPT): 25 U/L (ref 10–47)
AST: 31 U/L (ref 11–38)
Albumin: 3.1 g/dL — ABNORMAL LOW (ref 3.3–5.5)
Alkaline Phosphatase: 71 U/L (ref 26–84)
BILIRUBIN TOTAL: 0.5 mg/dL (ref 0.20–1.60)
BUN, Bld: 23 mg/dL — ABNORMAL HIGH (ref 7–22)
CHLORIDE: 108 meq/L (ref 98–108)
CO2: 25 meq/L (ref 18–33)
Calcium: 8.6 mg/dL (ref 8.0–10.3)
Creat: 1.1 mg/dl (ref 0.6–1.2)
GLUCOSE: 97 mg/dL (ref 73–118)
Potassium: 3.8 mEq/L (ref 3.3–4.7)
SODIUM: 141 meq/L (ref 128–145)
Total Protein: 7 g/dL (ref 6.4–8.1)

## 2015-05-03 MED ORDER — SODIUM CHLORIDE 0.9 % IV SOLN
INTRAVENOUS | Status: DC
  Administered 2015-05-03: 10:00:00 via INTRAVENOUS

## 2015-05-03 MED ORDER — SODIUM CHLORIDE 0.45 % IV SOLN: INTRAVENOUS | Status: DC

## 2015-05-03 MED ORDER — ZOLEDRONIC ACID 4 MG/100ML IV SOLN
4.0000 mg | Freq: Once | INTRAVENOUS | Status: AC
  Administered 2015-05-03: 4 mg via INTRAVENOUS
  Filled 2015-05-03: qty 100

## 2015-05-03 NOTE — Patient Instructions (Signed)

## 2015-05-03 NOTE — Progress Notes (Signed)
Hematology and Oncology Follow Up Visit  Terre Hanneman [id number] May 29, 1951 64 y.o. 05/03/2015   Principle Diagnosis:  IgG Kappa plasmacytoma Iron deficiency anemia  Current Therapy:   Zometa 4 mg IV q. 2 months IV iron-last received Feraheme in June 2015 with a dose of 1020 mg    Interim History: Ms. Weinfeld is here today for a follow-up. She continue to do well and has had no problems since we saw her last June.  She states that she saw her cardiologist yesterday and got a good report.  She denies fatigue, fever, chills, n/v, cough, rash, dizziness, headaches, SOB, chest pain, palpitations, abdominal pain or changes in bowel or bladder habits. She has not noticed any blood in her urine or stool.  No lymphadenopathy found on assessment.  No swelling, tenderness, numbness or tingling in her extremities. She has no c/o joint or "boney" pain at this time.  In June, there was a possibility of a faint M spike. Her IgG level was 1250 mg/dl and kappa light chain was 3.03 mg/dl. Her iron studies at this time were good. She has not required [facility] since June 2015.   Medications:    Medication List       This list is accurate as of: 05/03/15  9:09 AM.  Always use your most recent med list.               acetaminophen 325 MG tablet  Commonly known as:  TYLENOL  Take 650 mg by mouth every 6 (six) hours as needed.     albuterol 108 (90 BASE) MCG/ACT inhaler  Commonly known as:  PROVENTIL HFA;VENTOLIN HFA  Inhale 2 puffs into the lungs every 6 (six) hours as needed for wheezing or shortness of breath.     amLODipine 10 MG tablet  Commonly known as:  NORVASC  Take 1 tablet (10 mg total) by mouth daily.     aspirin 325 MG tablet  Take 325 mg by mouth daily.     atorvastatin 10 MG tablet  Commonly known as:  LIPITOR  Take 10 mg by mouth daily.     CARAFATE 1 GM/10ML suspension  Generic drug:  sucralfate  TAKE  10 ML BY MOUTH EVERY 6 HOURS     docusate sodium 100 MG capsule    Commonly known as:  COLACE  Take 100 mg by mouth as needed for constipation.     ferrous sulfate 325 (65 FE) MG tablet  take 1 tablet by mouth once daily WITH BREAKFAST     fluconazole 150 MG tablet  Commonly known as:  DIFLUCAN  Take 1 tablet (150 mg total) by mouth once. Repeat if needed     HYDROcodone-acetaminophen 5-325 MG per tablet  Commonly known as:  NORCO/VICODIN  Take 1 tablet by mouth every 6 (six) hours as needed.     Interferon Beta-1b 0.3 MG Kit injection  Commonly known as:  BETASERON  Inject 0.25 mg into the skin every other day.     losartan 50 MG tablet  Commonly known as:  COZAAR  Take 1 tablet (50 mg total) by mouth daily.     metoprolol succinate 25 MG 24 hr tablet  Commonly known as:  TOPROL-XL  Take 25 mg by mouth daily.     Miconazole Nitrate 2 % Aerp  Spray to the undersurface of your breasts and in the groin area twice a day     nystatin cream  Commonly known as:  MYCOSTATIN  Apply 1  application topically 2 (two) times daily.     omeprazole 20 MG capsule  Commonly known as:  PRILOSEC  TAKE ONE CAPSULE BY MOUTH ONCE DAILY     predniSONE 10 MG tablet  Commonly known as:  DELTASONE  Take 3 a day for 3 days 2 a day for 3 days one a day for 3 days     ranitidine 150 MG tablet  Commonly known as:  ZANTAC  TAKE ONE TABLET BY MOUTH TWICE DAILY     VOLTAREN 1 % Gel  Generic drug:  diclofenac sodium  apply 2 grams to THE LEFT KNEE three times a day        Allergies:  Allergies  Allergen Reactions  . Celebrex [Celecoxib]   . Dristan Other (See Comments)    "made my chest tight"  . Robitussin (Alcohol Free) [Guaifenesin] Other (See Comments)    "made my chest tight"  . Ropinirole Hcl Other (See Comments)    "made my chest tight"  . Tramadol Other (See Comments)    "made my chest tight"    Past Medical History, Surgical history, Social history, and Family History were reviewed and updated.  Review of Systems: All other 10 point  review of systems is negative.   Physical Exam:  vitals were not taken for this visit.  Wt Readings from Last 3 Encounters:  05/02/15 213 lb 6.4 oz (96.798 kg)  04/18/15 211 lb 9.6 oz (95.981 kg)  04/11/15 210 lb 12.8 oz (95.618 kg)    Ocular: Sclerae unicteric, pupils equal, round and reactive to light Ear-nose-throat: Oropharynx clear, dentition fair Lymphatic: No cervical or supraclavicular adenopathy Lungs no rales or rhonchi, good excursion bilaterally Heart regular rate and rhythm, no murmur appreciated Abd soft, nontender, positive bowel sounds MSK no focal spinal tenderness, no joint edema Neuro: non-focal, well-oriented, appropriate affect Breasts: Deferred  Lab Results  Component Value Date   WBC 5.5 01/31/2015   HGB 11.8 01/31/2015   HCT 35.4 01/31/2015   MCV 96 01/31/2015   PLT 187 01/31/2015   Lab Results  Component Value Date   FERRITIN 978* 01/31/2015   IRON 94 01/31/2015   TIBC 244 01/31/2015   UIBC 149 01/31/2015   IRONPCTSAT 39 01/31/2015   Lab Results  Component Value Date   RETICCTPCT 0.9 10/05/2013   RBC 3.68* 01/31/2015   RETICCTABS 36.5 10/05/2013   Lab Results  Component Value Date   KPAFRELGTCHN 3.03* 01/31/2015   LAMBDASER 1.95 01/31/2015   KAPLAMBRATIO 1.55 01/31/2015   Lab Results  Component Value Date   IGGSERUM 1250 01/31/2015   IGA 178 01/31/2015   IGMSERUM 59 01/31/2015   Lab Results  Component Value Date   TOTALPROTELP 6.8 01/31/2015   ALBUMINELP 3.7* 01/31/2015   A1GS 0.4* 01/31/2015   A2GS 0.8 01/31/2015   BETS 0.4 01/31/2015   BETA2SER 0.4 01/31/2015   GAMS 1.2 01/31/2015   MSPIKE NOT DET 07/27/2014   SPEI * 01/31/2015     Chemistry      Component Value Date/Time   NA 143 01/31/2015 0930   NA 140 08/12/2014 1148   NA 140 01/28/2013 1421   K 3.6 01/31/2015 0930   K 4.1 08/12/2014 1148   CL 112* 01/31/2015 0930   CL 104 08/12/2014 1148   CO2 29 01/31/2015 0930   CO2 26 08/12/2014 1148   BUN 14 01/31/2015  0930   BUN 17 08/12/2014 1148   BUN 20 01/28/2013 1421   CREATININE 0.8 01/31/2015 0930  CREATININE 0.70 11/30/2013 0908      Component Value Date/Time   CALCIUM 9.0 01/31/2015 0930   CALCIUM 8.8 08/12/2014 1148   ALKPHOS 68 01/31/2015 0930   ALKPHOS 65 08/12/2014 1148   AST 38 01/31/2015 0930   AST 24 08/12/2014 1148   ALT 31 01/31/2015 0930   ALT 20 08/12/2014 1148   BILITOT 0.80 01/31/2015 0930   BILITOT 0.4 08/12/2014 1148     Impression and Plan: Ms. Karis is 64 year old female with a plasmacytoma. She had radiation to the left ribs in February of 2014. So far she has done well and there has been no evidence of recurrence. She is asymptomatic at this time.  Her Myeloma studies today are pending. They were slightly increased at her last visit. We will continue to monitor these closely.  She will get Zomet today. We will continue to administer this every 3 months.  We will see her back in 3 months for labs, follow-up and infusion.  She knows to call here with any questions or concerns. We can certainly see her sooner if need be.   Eliezer Bottom, NP 9/22/20169:09 AM

## 2015-05-07 LAB — PROTEIN ELECTROPHORESIS, SERUM, WITH REFLEX
ALPHA-2-GLOBULIN: 0.8 g/dL (ref 0.5–0.9)
Albumin ELP: 3.5 g/dL — ABNORMAL LOW (ref 3.8–4.8)
Alpha-1-Globulin: 0.4 g/dL — ABNORMAL HIGH (ref 0.2–0.3)
Beta 2: 0.4 g/dL (ref 0.2–0.5)
Beta Globulin: 0.3 g/dL — ABNORMAL LOW (ref 0.4–0.6)
Gamma Globulin: 1.1 g/dL (ref 0.8–1.7)
Total Protein, Serum Electrophoresis: 6.4 g/dL (ref 6.1–8.1)

## 2015-05-07 LAB — IGG, IGA, IGM
IGG (IMMUNOGLOBIN G), SERUM: 1120 mg/dL (ref 690–1700)
IgA: 200 mg/dL (ref 69–380)
IgM, Serum: 63 mg/dL (ref 52–322)

## 2015-05-07 LAB — KAPPA/LAMBDA LIGHT CHAINS
KAPPA FREE LGHT CHN: 2.51 mg/dL — AB (ref 0.33–1.94)
Kappa:Lambda Ratio: 0.96 (ref 0.26–1.65)
LAMBDA FREE LGHT CHN: 2.62 mg/dL (ref 0.57–2.63)

## 2015-05-07 LAB — IFE INTERPRETATION

## 2015-05-15 ENCOUNTER — Encounter: Payer: Self-pay | Admitting: Emergency Medicine

## 2015-06-07 ENCOUNTER — Ambulatory Visit (INDEPENDENT_AMBULATORY_CARE_PROVIDER_SITE_OTHER): Payer: BC Managed Care – PPO | Admitting: Emergency Medicine

## 2015-06-07 VITALS — BP 130/70 | HR 84 | Temp 98.3°F | Resp 18 | Ht 62.0 in | Wt 213.0 lb

## 2015-06-07 DIAGNOSIS — K2100 Gastro-esophageal reflux disease with esophagitis, without bleeding: Secondary | ICD-10-CM

## 2015-06-07 MED ORDER — SUCRALFATE 1 G PO TABS: ORAL_TABLET | ORAL | Status: DC

## 2015-06-07 NOTE — Progress Notes (Signed)
Subjective:  Patient ID: Charl Wellen, female    DOB: 01-18-1951  Age: 64 y.o. MRN: [id number]  CC: Medication Refill   HPI Cloria Ciresi presents  with a long-standing history of GI disorder. She says she has reflux esophagitis although this is not documented in the chart. She does take chronic Carafate and Prilosec. She has nearly exhausted her supply of Carafate and is coming in. It renewed. She denies any nausea vomiting. No stool change. 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   She has past surgical history that includes Cholecystectomy; Abdominal hysterectomy; Breast surgery; Rotator cuff repair; and Bone biopsy (05/17/12).   Her  family history includes Cancer in her brother and father; Dementia in her mother; Diabetes in her sister and sister; Hypertension in her mother.  She   reports that she quit smoking about 36 years ago. Her smoking use included Cigarettes. She started smoking about 38 years ago. She has a .2 pack-year smoking history. She has never used smokeless tobacco. She reports that she does not drink alcohol or use illicit drugs.  Outpatient Prescriptions Prior to Visit  Medication Sig Dispense Refill  . acetaminophen (TYLENOL) 325 MG tablet Take 650 mg by mouth every 6 (six) hours as needed.    Marland Kitchen amLODipine (NORVASC) 10 MG tablet Take 1 tablet (10 mg total) by mouth daily. 90 tablet 3  . atorvastatin (LIPITOR) 10 MG tablet Take 10 mg by mouth daily.    Marland Kitchen CARAFATE 1 GM/10ML suspension TAKE  10 ML BY MOUTH EVERY 6 HOURS 1260 mL 3  . ferrous sulfate 325 (65 FE) MG tablet take 1 tablet by mouth once  daily WITH BREAKFAST 30 tablet 11  . HYDROcodone-acetaminophen (NORCO/VICODIN) 5-325 MG per tablet Take 1 tablet by mouth every 6 (six) hours as needed. 30 tablet 0  . Interferon Beta-1b (BETASERON) 0.3 MG KIT injection Inject 0.25 mg into the skin every other day. 1 kit 6  . losartan (COZAAR) 50 MG tablet Take 1 tablet (50 mg total) by mouth daily. 30 tablet 5  . metoprolol succinate (TOPROL-XL) 25 MG 24 hr tablet Take 25 mg by mouth daily.     . Miconazole Nitrate 2 % AERP Spray to the undersurface of your breasts and in the groin area twice a day 1 Can 2  . omeprazole (PRILOSEC) 20 MG capsule TAKE ONE CAPSULE BY MOUTH ONCE DAILY 30 capsule 4  . ranitidine (ZANTAC) 150 MG tablet TAKE ONE TABLET BY MOUTH TWICE DAILY 60 tablet 5  . VOLTAREN 1 % GEL apply 2 grams to THE LEFT KNEE three times a day 100 g 2  . albuterol (PROVENTIL HFA;VENTOLIN HFA) 108 (90 BASE) MCG/ACT inhaler Inhale 2 puffs into the lungs every 6 (six) hours as needed for wheezing or shortness of breath. (Patient not taking: Reported on 06/07/2015) 1 Inhaler 6  . aspirin 325 MG tablet Take 325 mg by mouth daily.    Marland Kitchen docusate sodium (COLACE) 100 MG capsule Take 100 mg by mouth as needed for constipation.    Marland Kitchen nystatin cream (MYCOSTATIN) Apply 1 application topically 2 (two) times daily. (Patient not taking: Reported on 06/07/2015) 30 g 0  . fluconazole (DIFLUCAN) 150 MG tablet Take  1 tablet (150 mg total) by mouth once. Repeat if needed 2 tablet 0  . predniSONE (DELTASONE) 10 MG tablet Take 3 a day for 3 days 2 a day for 3 days one a day for 3 days 18 tablet 0   Facility-Administered Medications Prior to Visit  Medication Dose Route Frequency Provider Last Rate Last Dose  . 0.9 %  sodium chloride infusion   Intravenous Continuous [name], MD   Stopped at 11/30/13 1216  . albuterol (PROVENTIL) (2.5 MG/3ML) 0.083% nebulizer solution 2.5 mg  2.5 mg Nebulization Once Darlyne Russian, MD      . albuterol (PROVENTIL) (2.5 MG/3ML)  0.083% nebulizer solution 2.5 mg  2.5 mg Nebulization Once Darlyne Russian, MD      . cyanocobalamin ((VITAMIN B-12)) injection 1,000 mcg  1,000 mcg Intramuscular Q30 days Tishira R Brewington, PA-C   1,000 mcg at 03/11/15 1038    Social History   Social History  . Marital Status: Widowed    Spouse Name: N/A  . Number of Children: 1  . Years of Education: 12   Occupational History  .    Marland Kitchen Retired     Social History Main Topics  . Smoking status: Former Smoker -- 0.10 packs/day for 2 years    Types: Cigarettes    Start date: 10/05/1976    Quit date: 04/19/1979  . Smokeless tobacco: Never Used     Comment: quit 35 years ago   . Alcohol Use: No  . Drug Use: No  . Sexual Activity: No   Other Topics Concern  . None   Social History Narrative   Retired: Bus Driver for 28 years   Patient lives at home alone.    Patient is retired.    Patient is widowed.    Patient is right handed.    Patient has a high school education.    Patient has 1 child.      Review of Systems  Constitutional: Negative for fever, chills and appetite change.  HENT: Negative for congestion, ear pain, postnasal drip, sinus pressure and sore throat.   Eyes: Negative for pain and redness.  Respiratory: Negative for cough, shortness of breath and wheezing.   Cardiovascular: Negative for leg swelling.  Gastrointestinal: Negative for nausea, vomiting, abdominal pain, diarrhea, constipation and blood in stool.  Endocrine: Negative for polyuria.  Genitourinary: Negative for dysuria, urgency, frequency and flank pain.  Musculoskeletal: Negative for gait problem.  Skin: Negative for rash.  Neurological: Negative for weakness and headaches.  Psychiatric/Behavioral: Negative for confusion and decreased concentration. The patient is not nervous/anxious.     Objective:  BP 130/70 mmHg  Pulse 84  Temp(Src) 98.3 F (36.8 C) (Oral)  Resp 18  Ht '5\' 2"'  (1.575 m)  Wt 213 lb (96.616 kg)  BMI 38.95 kg/m2  SpO2  97%  Physical Exam  Constitutional: She is oriented to person, place, and time. She appears well-developed and well-nourished.  HENT:  Head: Normocephalic and atraumatic.  Eyes: Conjunctivae are normal. Pupils are equal, round, and reactive to light.  Pulmonary/Chest: Effort normal.  Musculoskeletal: She exhibits no edema.  Neurological: She is alert and oriented to person, place, and time.  Skin: Skin is dry.  Psychiatric: She has a normal mood and affect. Her behavior is normal. Thought content normal.      Assessment & Plan:   Ariba was seen today for medication refill.  Diagnoses and all orders for this visit:  Gastroesophageal reflux disease with esophagitis  Other orders -     sucralfate (CARAFATE) 1 G tablet; 1 tablet 1 hr ac and hs   I have discontinued Ms. Marcou's predniSONE and fluconazole. I am also having her start on sucralfate. Additionally, I am having her maintain her metoprolol succinate, docusate sodium, atorvastatin, VOLTAREN, amLODipine, ferrous sulfate, losartan, CARAFATE, albuterol, ranitidine, omeprazole, HYDROcodone-acetaminophen, aspirin, acetaminophen, nystatin cream, Interferon Beta-1b, Miconazole Nitrate, and loratadine. We will continue to administer cyanocobalamin, albuterol, and albuterol.  Meds ordered this encounter  Medications  . loratadine (CLARITIN) 10 MG tablet    Sig: Take 10 mg by mouth daily.  . sucralfate (CARAFATE) 1 G tablet    Sig: 1 tablet 1 hr ac and hs    Dispense:  120 tablet    Refill:  2    Appropriate red flag conditions were discussed with the patient as well as actions that should be taken.  Patient expressed his understanding.  Follow-up: Return if symptoms worsen or fail to improve.  Roselee Culver, MD

## 2015-06-07 NOTE — Patient Instructions (Signed)

## 2015-06-15 ENCOUNTER — Other Ambulatory Visit: Payer: Self-pay

## 2015-06-15 MED ORDER — SUCRALFATE 1 GM/10ML PO SUSP: ORAL | Status: DC

## 2015-06-15 NOTE — Telephone Encounter (Signed)
Pt reqs liquid form of carafate instead of tablets. Resent in liquid form at same dosage as Rxd.

## 2015-07-10 ENCOUNTER — Other Ambulatory Visit: Payer: Self-pay

## 2015-07-10 MED ORDER — ATORVASTATIN CALCIUM 10 MG PO TABS: 10.0000 mg | ORAL_TABLET | Freq: Every day | ORAL | Status: DC

## 2015-07-11 ENCOUNTER — Ambulatory Visit (INDEPENDENT_AMBULATORY_CARE_PROVIDER_SITE_OTHER): Payer: BC Managed Care – PPO | Admitting: Family Medicine

## 2015-07-11 ENCOUNTER — Ambulatory Visit (INDEPENDENT_AMBULATORY_CARE_PROVIDER_SITE_OTHER): Payer: BC Managed Care – PPO

## 2015-07-11 VITALS — BP 150/72 | HR 60 | Temp 98.2°F | Resp 18 | Ht 62.0 in | Wt 214.0 lb

## 2015-07-11 DIAGNOSIS — M79671 Pain in right foot: Secondary | ICD-10-CM

## 2015-07-11 DIAGNOSIS — K1379 Other lesions of oral mucosa: Secondary | ICD-10-CM

## 2015-07-11 DIAGNOSIS — E538 Deficiency of other specified B group vitamins: Secondary | ICD-10-CM

## 2015-07-11 DIAGNOSIS — W19XXXA Unspecified fall, initial encounter: Secondary | ICD-10-CM

## 2015-07-11 DIAGNOSIS — N644 Mastodynia: Secondary | ICD-10-CM

## 2015-07-11 MED ORDER — HYDROCODONE-ACETAMINOPHEN 5-325 MG PO TABS: 1.0000 | ORAL_TABLET | Freq: Two times a day (BID) | ORAL | Status: DC

## 2015-07-11 NOTE — Patient Instructions (Signed)
Let us know if pain does not disappear in the next week

## 2015-07-11 NOTE — Progress Notes (Signed)
Subjective:  This chart was scribed for Robyn Haber MD, by [name], at [facility].  This patient was seen in room 13 and the patient's care was started at 10:06 AM.    Patient ID: Morgan Espinoza, female    DOB: 04/25/1951, 64 y.o.   MRN: [id number] Chief Complaint  Patient presents with  . Foot Injury    Fell on right side on pavement  . Fall    6 days ago    HPI  HPI Comments: Morgan Espinoza is a 64 y.o. female who presents to the [facility] complaining of a right foot pain onset two days ago.  Patient notes that she fell on the pavement 5 days ago (at church) which initially caused her right knee pain (states that it feels better now).  She denies major difficulty walking but feels like she has pressure on her feet while doing so.  She has no other questions or complaints today.     Patient Active Problem List   Diagnosis Date Noted  . Primary osteoarthritis of both knees 10/12/2014  . B12 deficiency 10/12/2014  . Encounter for long-term (current) use of other medications 01/28/2013  . 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 Anemia   . Hyperlipidemia   . Vertigo   . Vitamin D deficiency   . Obesity   . Achilles tendonitis   . Anemia, iron deficiency 07/23/2012  . Plasma cell myeloma ([place]) 05/17/2012  . Cancer ([place]) 05/11/2012  . Rib lesion 04/28/2012  . Chest pain 06/05/2011  . Hypertension 06/05/2011  . Asthma 06/05/2011  . GERD (gastroesophageal reflux disease) 06/05/2011  . MS (multiple sclerosis) ([place]) 06/05/2011   Past Medical History  Diagnosis Date  . Hypertension   . GERD (gastroesophageal reflux disease)   . Asthma   . Multiple sclerosis ([place])   . Hyperlipidemia   . Vertigo   . Vitamin D deficiency   . Obesity   . Achilles tendonitis   . OA (osteoarthritis) of knee   . Anemia, iron deficiency 07/23/2012  . 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 Cancer ([place]) 05/2012    plasma cell myeloma  .  S/P radiation therapy 08/12/12 - 09/21/12    left Anterior 2nd Rib Lesion / 50.4 GY / 28 Fractions   Past Surgical History  Procedure Laterality Date  . Cholecystectomy      lap choley  . Abdominal hysterectomy      fibroids  . Breast surgery      left breast biopsy-benign  . Rotator cuff repair    . Bone biopsy  05/17/12    Left 2nd Rib, Plasma Cell Myeloma   Allergies  Allergen Reactions  . Celebrex [Celecoxib]   . Dristan Other (See Comments)    "made my chest tight"  . Robitussin (Alcohol Free) [Guaifenesin] Other (See Comments)    "made my chest tight"  . Ropinirole Hcl Other (See Comments)    "made my chest tight"  . Tramadol Other (See Comments)    "made my chest tight"   Prior to Admission medications   Medication Sig Start Date End Date Taking? Authorizing Provider  acetaminophen (TYLENOL) 325 MG tablet Take 650 mg by mouth every 6 (six) hours as needed.   Yes Historical Provider, MD  albuterol (PROVENTIL HFA;VENTOLIN HFA) 108 (90 BASE) MCG/ACT inhaler Inhale 2 puffs into the lungs every 6 (six) hours as needed for wheezing or shortness of breath.  11/07/14  Yes Orma Flaming, MD  amLODipine (NORVASC) 10 MG tablet Take 1 tablet (10 mg total) by mouth daily. 09/16/14  Yes Leandrew Koyanagi, MD  atorvastatin (LIPITOR) 10 MG tablet Take 1 tablet (10 mg total) by mouth daily. 07/10/15  Yes Darlyne Russian, MD  ferrous sulfate 325 (65 FE) MG tablet take 1 tablet by mouth once daily WITH BREAKFAST 09/19/14  Yes Wardell Honour, MD  HYDROcodone-acetaminophen (NORCO/VICODIN) 5-325 MG per tablet Take 1 tablet by mouth every 6 (six) hours as needed. 12/26/14  Yes Orma Flaming, MD  Interferon Beta-1b (BETASERON) 0.3 MG KIT injection Inject 0.25 mg into the skin every other day. 03/16/15  Yes Marcial Pacas, MD  losartan (COZAAR) 50 MG tablet Take 1 tablet (50 mg total) by mouth daily. 09/28/14  Yes Darlyne Russian, MD  metoprolol succinate (TOPROL-XL) 25 MG 24 hr tablet Take 25 mg by mouth daily.   04/04/13  Yes Historical Provider, MD  omeprazole (PRILOSEC) 20 MG capsule TAKE ONE CAPSULE BY MOUTH ONCE DAILY 12/07/14  Yes Orma Flaming, MD  ranitidine (ZANTAC) 150 MG tablet TAKE ONE TABLET BY MOUTH TWICE DAILY 11/13/14  Yes Tereasa Coop, PA-C  sucralfate (CARAFATE) 1 GM/10ML suspension TAKE  10 ML BY MOUTH 4 TIMES A DAY, 1 HR BEFORE MEALS AND AT BEDTIME. 06/15/15  Yes Roselee Culver, MD  aspirin 325 MG tablet Take 325 mg by mouth daily.    Historical Provider, MD  docusate sodium (COLACE) 100 MG capsule Take 100 mg by mouth as needed for constipation.    Historical Provider, MD  loratadine (CLARITIN) 10 MG tablet Take 10 mg by mouth daily.    Historical Provider, MD  VOLTAREN 1 % GEL apply 2 grams to THE LEFT KNEE three times a day Patient not taking: Reported on 07/11/2015 09/07/14   Darlyne Russian, MD   Social History   Social History  . Marital Status: Widowed    Spouse Name: N/A  . Number of Children: 1  . Years of Education: 12   Occupational History  .    Marland Kitchen Retired     Social History Main Topics  . Smoking status: Former Smoker -- 0.10 packs/day for 2 years    Types: Cigarettes    Start date: 10/05/1976    Quit date: 04/19/1979  . Smokeless tobacco: Never Used     Comment: quit 35 years ago   . Alcohol Use: No  . Drug Use: No  . Sexual Activity: No   Other Topics Concern  . 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    Patient is retired.    Patient is widowed.    Patient is right handed.    Patient has a high school education.    Patient has 1 child.         Review of Systems  Constitutional: Negative for fever and chills.  Eyes: Negative for pain, redness and itching.  Respiratory: Negative for cough, choking and shortness of breath.   Gastrointestinal: Negative for nausea.  Musculoskeletal: Positive for gait problem. Negative for neck pain and neck stiffness.  Neurological: Negative  for speech difficulty.       Objective:   Physical Exam  Constitutional: She is oriented to person, place, and time. She appears well-developed and well-nourished. No distress.  HENT:  Head: Normocephalic and atraumatic.  Eyes: Pupils are  equal, round, and reactive to light.  Neck: Normal range of motion.  Pulmonary/Chest: No respiratory distress.  Musculoskeletal:  Tender of dorsolateral aspect of her foot over the third and fourth metatarsals.   Neurological: She is alert and oriented to person, place, and time.  Skin: Skin is warm and dry.  Psychiatric: She has a normal mood and affect.    Filed Vitals:   07/11/15 0946  BP: 150/72  Pulse: 60  Temp: 98.2 F (36.8 C)  TempSrc: Oral  Resp: 18  Height: '5\' 2"'  (1.575 m)  Weight: 214 lb (97.07 kg)  SpO2: 96%   [facility] (PRIMARY) x-ray report read by Dr. Joseph Art MD,  Right foot: normal, no fracture, no dislocation.      Assessment & Plan:   This chart was scribed in my presence and reviewed by me personally.    ICD-9-CM ICD-10-CM   1. Right foot pain 729.5 M79.671 DG Foot Complete Right     HYDROcodone-acetaminophen (NORCO/VICODIN) 5-325 MG tablet  2. Fall, initial encounter [phone] W19.XXXA DG Foot Complete Right     HYDROcodone-acetaminophen (NORCO/VICODIN) 5-325 MG tablet  3. Breast pain, left 611.71 N64.4   4. Mouth pain 528.9 K13.79   5. B12 deficiency 266.2 E53.8      Signed, Robyn Haber, MD

## 2015-08-02 ENCOUNTER — Ambulatory Visit (HOSPITAL_BASED_OUTPATIENT_CLINIC_OR_DEPARTMENT_OTHER): Payer: BC Managed Care – PPO

## 2015-08-02 ENCOUNTER — Ambulatory Visit (HOSPITAL_BASED_OUTPATIENT_CLINIC_OR_DEPARTMENT_OTHER): Payer: BC Managed Care – PPO | Admitting: Family

## 2015-08-02 ENCOUNTER — Encounter: Payer: Self-pay | Admitting: Hematology & Oncology

## 2015-08-02 VITALS — BP 140/67 | HR 61 | Temp 97.6°F | Wt 216.1 lb

## 2015-08-02 DIAGNOSIS — C9000 Multiple myeloma not having achieved remission: Secondary | ICD-10-CM

## 2015-08-02 DIAGNOSIS — C9030 Solitary plasmacytoma not having achieved remission: Secondary | ICD-10-CM | POA: Diagnosis not present

## 2015-08-02 DIAGNOSIS — D509 Iron deficiency anemia, unspecified: Secondary | ICD-10-CM

## 2015-08-02 LAB — CMP (CANCER CENTER ONLY)
ALBUMIN: 3.2 g/dL — AB (ref 3.3–5.5)
ALT(SGPT): 34 U/L (ref 10–47)
AST: 40 U/L — AB (ref 11–38)
Alkaline Phosphatase: 68 U/L (ref 26–84)
BILIRUBIN TOTAL: 0.6 mg/dL (ref 0.20–1.60)
BUN: 15 mg/dL (ref 7–22)
CHLORIDE: 104 meq/L (ref 98–108)
CO2: 31 meq/L (ref 18–33)
CREATININE: 0.8 mg/dL (ref 0.6–1.2)
Calcium: 8.9 mg/dL (ref 8.0–10.3)
Glucose, Bld: 103 mg/dL (ref 73–118)
Potassium: 3.8 mEq/L (ref 3.3–4.7)
SODIUM: 146 meq/L — AB (ref 128–145)
TOTAL PROTEIN: 7.1 g/dL (ref 6.4–8.1)

## 2015-08-02 LAB — CBC WITH DIFFERENTIAL (CANCER CENTER ONLY)
BASO#: 0 10*3/uL (ref 0.0–0.2)
BASO%: 0.2 % (ref 0.0–2.0)
EOS%: 3.5 % (ref 0.0–7.0)
Eosinophils Absolute: 0.2 10*3/uL (ref 0.0–0.5)
HCT: 37.7 % (ref 34.8–46.6)
HGB: 11.9 g/dL (ref 11.6–15.9)
LYMPH#: 1.8 10*3/uL (ref 0.9–3.3)
LYMPH%: 36.3 % (ref 14.0–48.0)
MCH: 30.7 pg (ref 26.0–34.0)
MCHC: 31.6 g/dL — ABNORMAL LOW (ref 32.0–36.0)
MCV: 97 fL (ref 81–101)
MONO#: 0.5 10*3/uL (ref 0.1–0.9)
MONO%: 10.4 % (ref 0.0–13.0)
NEUT#: 2.4 10*3/uL (ref 1.5–6.5)
NEUT%: 49.6 % (ref 39.6–80.0)
PLATELETS: 175 10*3/uL (ref 145–400)
RBC: 3.87 10*6/uL (ref 3.70–5.32)
RDW: 12.1 % (ref 11.1–15.7)
WBC: 4.9 10*3/uL (ref 3.9–10.0)

## 2015-08-02 MED ORDER — SODIUM CHLORIDE 0.9 % IV SOLN
INTRAVENOUS | Status: DC
  Administered 2015-08-02: 12:00:00 via INTRAVENOUS

## 2015-08-02 MED ORDER — ZOLEDRONIC ACID 4 MG/100ML IV SOLN
4.0000 mg | Freq: Once | INTRAVENOUS | Status: AC
  Administered 2015-08-02: 4 mg via INTRAVENOUS
  Filled 2015-08-02: qty 100

## 2015-08-02 NOTE — Progress Notes (Signed)
Hematology and Oncology Follow Up Visit  Morgan Espinoza [id number] 12-19-1950 64 y.o. 08/02/2015   Principle Diagnosis:  IgG Kappa plasmacytoma Iron deficiency anemia  Current Therapy:   Zometa 4 mg IV q. 3 months IV iron-last received Feraheme in June 2015 with a dose of 1020 mg    Interim History: Morgan Espinoza is here today for a follow-up.She is doing well and looking forward to Christmas with Morgan Espinoza.  She responded nicely to the [facility] she received in February of last year. Morgan iron saturation in June was 39% with a ferritin of 978.  She has had no episodes of bleeding or bruising.  No c/o fatigue, fever, chills, n/v, cough, rash, dizziness, headaches, SOB, chest pain, palpitations, abdominal pain or changes in bowel or bladder habits.  No lymphadenopathy found on assessment. In September, Morgan M-spike was not detected. Morgan IgG level was 1120 mg/dl and kappa light chain was 2.51 mg/dl.  No swelling, tenderness, numbness or tingling in Morgan extremities. No c/o joint aches or pains.   Medications:    Medication List       This list is accurate as of: 08/02/15 11:10 AM.  Always use your most recent med list.               acetaminophen 325 MG tablet  Commonly known as:  TYLENOL  Take 650 mg by mouth every 6 (six) hours as needed.     albuterol 108 (90 BASE) MCG/ACT inhaler  Commonly known as:  PROVENTIL HFA;VENTOLIN HFA  Inhale 2 puffs into the lungs every 6 (six) hours as needed for wheezing or shortness of breath.     amLODipine 10 MG tablet  Commonly known as:  NORVASC  Take 1 tablet (10 mg total) by mouth daily.     aspirin 325 MG tablet  Take 325 mg by mouth daily.     atorvastatin 10 MG tablet  Commonly known as:  LIPITOR  Take 1 tablet (10 mg total) by mouth daily.     docusate sodium 100 MG capsule  Commonly known as:  COLACE  Take 100 mg by mouth as needed for constipation.     ferrous sulfate 325 (65 FE) MG tablet  take 1 tablet by mouth once daily  WITH BREAKFAST     HYDROcodone-acetaminophen 5-325 MG tablet  Commonly known as:  NORCO/VICODIN  Take 1 tablet by mouth every 12 (twelve) hours.     Interferon Beta-1b 0.3 MG Kit injection  Commonly known as:  BETASERON  Inject 0.25 mg into the skin every other day.     loratadine 10 MG tablet  Commonly known as:  CLARITIN  Take 10 mg by mouth daily.     losartan 50 MG tablet  Commonly known as:  COZAAR  Take 1 tablet (50 mg total) by mouth daily.     metoprolol succinate 25 MG 24 hr tablet  Commonly known as:  TOPROL-XL  Take 25 mg by mouth daily.     omeprazole 20 MG capsule  Commonly known as:  PRILOSEC  TAKE ONE CAPSULE BY MOUTH ONCE DAILY     ranitidine 150 MG tablet  Commonly known as:  ZANTAC  TAKE ONE TABLET BY MOUTH TWICE DAILY     sucralfate 1 GM/10ML suspension  Commonly known as:  CARAFATE  TAKE  10 ML BY MOUTH 4 TIMES A DAY, 1 HR BEFORE MEALS AND AT BEDTIME.     VOLTAREN 1 % Gel  Generic drug:  diclofenac sodium  apply 2 grams to THE LEFT KNEE three times a day        Allergies:  Allergies  Allergen Reactions  . Celebrex [Celecoxib]   . Dristan Other (See Comments)    "made my chest tight"  . Robitussin (Alcohol Free) [Guaifenesin] Other (See Comments)    "made my chest tight"  . Ropinirole Hcl Other (See Comments)    "made my chest tight"  . Tramadol Other (See Comments)    "made my chest tight"    Past Medical History, Surgical history, Social history, and Family History were reviewed and updated.  Review of Systems: All other 10 point review of systems is negative.   Physical Exam:  weight is 216 lb 1.9 oz (98.031 kg). Morgan oral temperature is 97.6 F (36.4 C). Morgan blood pressure is 140/67 and Morgan pulse is 61.   Wt Readings from Last 3 Encounters:  08/02/15 216 lb 1.9 oz (98.031 kg)  07/11/15 214 lb (97.07 kg)  06/07/15 213 lb (96.616 kg)    Ocular: Sclerae unicteric, pupils equal, round and reactive to light Ear-nose-throat:  Oropharynx clear, dentition fair Lymphatic: No cervical supraclavicular or axillary adenopathy Lungs no rales or rhonchi, good excursion bilaterally Heart regular rate and rhythm, no murmur appreciated Abd soft, nontender, positive bowel sounds, no liver or spleen tip palpated during exam.  MSK no focal spinal tenderness, no joint edema Neuro: non-focal, well-oriented, appropriate affect Breasts: Deferred  Lab Results  Component Value Date   WBC 4.9 08/02/2015   HGB 11.9 08/02/2015   HCT 37.7 08/02/2015   MCV 97 08/02/2015   PLT 175 08/02/2015   Lab Results  Component Value Date   FERRITIN 978* 01/31/2015   IRON 94 01/31/2015   TIBC 244 01/31/2015   UIBC 149 01/31/2015   IRONPCTSAT 39 01/31/2015   Lab Results  Component Value Date   RETICCTPCT 0.9 10/05/2013   RBC 3.87 08/02/2015   RETICCTABS 36.5 10/05/2013   Lab Results  Component Value Date   KPAFRELGTCHN 2.51* 05/03/2015   LAMBDASER 2.62 05/03/2015   KAPLAMBRATIO 0.96 05/03/2015   Lab Results  Component Value Date   IGGSERUM 1120 05/03/2015   IGA 200 05/03/2015   IGMSERUM 63 05/03/2015   Lab Results  Component Value Date   TOTALPROTELP 6.4 05/03/2015   ALBUMINELP 3.5* 05/03/2015   A1GS 0.4* 05/03/2015   A2GS 0.8 05/03/2015   BETS 0.3* 05/03/2015   BETA2SER 0.4 05/03/2015   GAMS 1.1 05/03/2015   MSPIKE NOT DET 07/27/2014   SPEI * 05/03/2015     Chemistry      Component Value Date/Time   NA 146* 08/02/2015 1005   NA 140 08/12/2014 1148   NA 140 01/28/2013 1421   K 3.8 08/02/2015 1005   K 4.1 08/12/2014 1148   CL 104 08/02/2015 1005   CL 104 08/12/2014 1148   CO2 31 08/02/2015 1005   CO2 26 08/12/2014 1148   BUN 15 08/02/2015 1005   BUN 17 08/12/2014 1148   BUN 20 01/28/2013 1421   CREATININE 0.8 08/02/2015 1005   CREATININE 0.70 11/30/2013 0908      Component Value Date/Time   CALCIUM 8.9 08/02/2015 1005   CALCIUM 8.8 08/12/2014 1148   ALKPHOS 68 08/02/2015 1005   ALKPHOS 65 08/12/2014  1148   AST 40* 08/02/2015 1005   AST 24 08/12/2014 1148   ALT 34 08/02/2015 1005   ALT 20 08/12/2014 1148   BILITOT 0.60 08/02/2015 1005   BILITOT 0.4 08/12/2014 1148  Impression and Plan: Morgan Espinoza is 64 year old female with a plasmacytoma. She had radiation to the left ribs in February of 2014. She is asymptomatic at this time and so far there has been no evidence of recurrence. Morgan Myeloma studies have been stable. M-spike was not detected in September. Results from today's labs are pending.  She will receive Zometa today and continue Morgan every 3 month schedule.  We will see Morgan back in 3 months for labs and follow-up She will contact us with any questions or concerns. We can certainly see Morgan sooner if need be.   Eliezer Bottom, NP 12/22/201611:10 AM

## 2015-08-02 NOTE — Patient Instructions (Signed)

## 2015-08-06 ENCOUNTER — Other Ambulatory Visit: Payer: Self-pay | Admitting: Emergency Medicine

## 2015-08-06 ENCOUNTER — Other Ambulatory Visit: Payer: Self-pay | Admitting: Internal Medicine

## 2015-08-07 ENCOUNTER — Encounter: Payer: Self-pay | Admitting: Emergency Medicine

## 2015-08-07 ENCOUNTER — Ambulatory Visit (INDEPENDENT_AMBULATORY_CARE_PROVIDER_SITE_OTHER): Payer: BC Managed Care – PPO | Admitting: Emergency Medicine

## 2015-08-07 VITALS — BP 153/85 | HR 76 | Temp 98.0°F | Resp 16 | Ht 62.0 in | Wt 217.0 lb

## 2015-08-07 DIAGNOSIS — Z23 Encounter for immunization: Secondary | ICD-10-CM | POA: Diagnosis not present

## 2015-08-07 DIAGNOSIS — I10 Essential (primary) hypertension: Secondary | ICD-10-CM

## 2015-08-07 LAB — PROTEIN ELECTROPHORESIS, SERUM, WITH REFLEX
ALBUMIN ELP: 3.7 g/dL — AB (ref 3.8–4.8)
ALPHA-2-GLOBULIN: 0.8 g/dL (ref 0.5–0.9)
Alpha-1-Globulin: 0.4 g/dL — ABNORMAL HIGH (ref 0.2–0.3)
BETA GLOBULIN: 0.4 g/dL (ref 0.4–0.6)
Beta 2: 0.4 g/dL (ref 0.2–0.5)
Gamma Globulin: 1.2 g/dL (ref 0.8–1.7)
Total Protein, Serum Electrophoresis: 6.8 g/dL (ref 6.1–8.1)

## 2015-08-07 LAB — KAPPA/LAMBDA LIGHT CHAINS
KAPPA LAMBDA RATIO: 1.42 (ref 0.26–1.65)
Kappa free light chain: 3.18 mg/dL — ABNORMAL HIGH (ref 0.33–1.94)
LAMBDA FREE LGHT CHN: 2.24 mg/dL (ref 0.57–2.63)

## 2015-08-07 LAB — IGG, IGA, IGM
IGG (IMMUNOGLOBIN G), SERUM: 1200 mg/dL (ref 690–1700)
IGM, SERUM: 67 mg/dL (ref 52–322)
IgA: 204 mg/dL (ref 69–380)

## 2015-08-07 LAB — IFE INTERPRETATION

## 2015-08-07 NOTE — Progress Notes (Signed)
By signing my name below, I, Morgan Espinoza, attest that this documentation has been prepared under the direction and in the presence of Morgan Jordan, MD. Electronically Signed: Judithe Espinoza, ER Scribe. 08/07/2015. 2:04 PM.  Chief Complaint:  Chief Complaint  Patient presents with  . Follow-up  . Hypertension    HPI: Morgan Espinoza is a 64 y.o. female who reports to [facility] today for a regular physical. She states she is doing well. She states her MS sx are well managed. Her left ankle moves a little slow, but her ankles are otherwise doing well.   She goes to the cancer center for management of her multiple myeloma and sees [employer] . Dr. Krista Blue is the supervising neurologist. She has had blood work within the last three months.  Past Medical History  Diagnosis Date  . Hypertension   . GERD (gastroesophageal reflux disease)   . Asthma   . Multiple sclerosis ([place])   . Hyperlipidemia   . Vertigo   . Vitamin D deficiency   . Obesity   . Achilles tendonitis   . OA (osteoarthritis) of knee   . Anemia, iron deficiency 07/23/2012  . 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 Cancer ([place]) 05/2012    plasma cell myeloma  . S/P radiation therapy 08/12/12 - 09/21/12    left Anterior 2nd Rib Lesion / 50.4 GY / 28 Fractions   Past Surgical History  Procedure Laterality Date  . Cholecystectomy      lap choley  . Abdominal hysterectomy      fibroids  . Breast surgery      left breast biopsy-benign  . Rotator cuff repair    . Bone biopsy  05/17/12    Left 2nd Rib, Plasma Cell Myeloma   Social History   Social History  . Marital Status: Widowed    Spouse Name: N/A  . Number of Children: 1  . Years of Education: 12   Occupational History  .    [employer]land [employer] Retired     Social History Main Topics  . Smoking status: Former Smoker -- 0.10 packs/day for 2 years    Types: Cigarettes    Start date: 10/05/1976    Quit date: 04/19/1979  . Smokeless tobacco: Never Used   Comment: quit 35 years ago   . Alcohol Use: No  . Drug Use: No  . Sexual Activity: No   Other Topics Concern  . None   Social History Narrative   Retired: Bus Driver for 28 years   Patient lives at home alone.    Patient is retired.    Patient is widowed.    Patient is right handed.    Patient has a high school education.    Patient has 1 child.    Family History  Problem Relation Age of Onset  . Dementia Mother   . Hypertension Mother   . Cancer Father     lung; +tobacco  . Diabetes Sister   . Cancer Brother   . Diabetes Sister    Allergies  Allergen Reactions  . Celebrex [Celecoxib]   . Dristan Other (See Comments)    "made my chest tight"  . Robitussin (Alcohol Free) [Guaifenesin] Other (See Comments)    "made my chest tight"  . Ropinirole Hcl Other (See Comments)    "made my chest tight"  . Tramadol Other (See Comments)    "made my chest tight"   Prior to Admission medications  Medication Sig Start Date End Date Taking? Authorizing Provider  acetaminophen (TYLENOL) 325 MG tablet Take 650 mg by mouth every 6 (six) hours as needed.    Historical Provider, MD  albuterol (PROVENTIL HFA;VENTOLIN HFA) 108 (90 BASE) MCG/ACT inhaler Inhale 2 puffs into the lungs every 6 (six) hours as needed for wheezing or shortness of breath. 11/07/14   Orma Flaming, MD  amLODipine (NORVASC) 10 MG tablet Take 1 tablet (10 mg total) by mouth daily. 09/16/14   Leandrew Koyanagi, MD  aspirin 325 MG tablet Take 325 mg by mouth daily.    Historical Provider, MD  atorvastatin (LIPITOR) 10 MG tablet Take 1 tablet (10 mg total) by mouth daily. 07/10/15   Darlyne Russian, MD  docusate sodium (COLACE) 100 MG capsule Take 100 mg by mouth as needed for constipation.    Historical Provider, MD  ferrous sulfate 325 (65 FE) MG tablet take 1 tablet by mouth once daily WITH BREAKFAST 09/19/14   Wardell Honour, MD  HYDROcodone-acetaminophen (NORCO/VICODIN) 5-325 MG tablet Take 1 tablet by mouth every 12  (twelve) hours. 07/11/15   Robyn Haber, MD  Interferon Beta-1b (BETASERON) 0.3 MG KIT injection Inject 0.25 mg into the skin every other day. 03/16/15   Marcial Pacas, MD  loratadine (CLARITIN) 10 MG tablet Take 10 mg by mouth daily.    Historical Provider, MD  losartan (COZAAR) 50 MG tablet Take 1 tablet (50 mg total) by mouth daily. 09/28/14   Darlyne Russian, MD  metoprolol succinate (TOPROL-XL) 25 MG 24 hr tablet Take 25 mg by mouth daily.  04/04/13   Historical Provider, MD  omeprazole (PRILOSEC) 20 MG capsule TAKE ONE CAPSULE BY MOUTH ONCE DAILY 12/07/14   Orma Flaming, MD  ranitidine (ZANTAC) 150 MG tablet TAKE ONE TABLET BY MOUTH TWICE DAILY 11/13/14   Tereasa Coop, PA-C  sucralfate (CARAFATE) 1 GM/10ML suspension TAKE  10 ML BY MOUTH 4 TIMES A DAY, 1 HR BEFORE MEALS AND AT BEDTIME. 06/15/15   Roselee Culver, MD  VOLTAREN 1 % GEL apply 2 grams to THE LEFT KNEE three times a day 09/07/14   Darlyne Russian, MD     ROS: The patient denies fevers, chills, night sweats, unintentional weight loss, chest pain, palpitations, wheezing, dyspnea on exertion, nausea, vomiting, abdominal pain, dysuria, hematuria, melena, numbness, weakness, or tingling.   All other systems have been reviewed and were otherwise negative with the exception of those mentioned in the HPI and as above.    PHYSICAL EXAM: Filed Vitals:   08/07/15 1312 08/07/15 1313  BP: 176/98 153/85  Pulse: 85 76  Temp: 98 F (36.7 C)   Resp: 16    Body mass index is 39.68 kg/(m^2).   General: Alert, no acute distress HEENT:  Normocephalic, atraumatic, oropharynx patent. Eye: [name] [facility] Cardiovascular:  Regular rate and rhythm, no rubs murmurs or gallops.  No Carotid bruits, radial pulse intact. No pedal edema.  Respiratory: Clear to auscultation bilaterally.  No wheezes, rales, or rhonchi.  No cyanosis, no use of accessory musculature Abdominal: No organomegaly, abdomen is soft and non-tender, positive bowel sounds.  No  masses. Musculoskeletal: Gait intact. No edema, tenderness Skin: No rashes. Neurologic: Facial musculature symmetric. Psychiatric: Patient acts appropriately throughout our interaction. Lymphatic: No cervical or submandibular lymphadenopathy  Repeat BP done by Dr. Everlene Farrier 132/80.  LABS:   EKG/XRAY:   Primary read interpreted by Dr. Everlene Farrier at [facility].   ASSESSMENT/PLAN: Repeat blood pressure was better.  She has regular follow-ups for her multiple sclerosis and multiple myeloma. Overall she is doing well.I personally performed the services described in this documentation, which was scribed in my presence. The recorded information has been reviewed and is accurate.   Gross sideeffects, risk and benefits, and alternatives of medications d/w patient. Patient is aware that all medications have potential sideeffects and we are unable to predict every sideeffect or drug-drug interaction that may occur.  [name] 08/07/2015 2:03 PM

## 2015-08-09 NOTE — Telephone Encounter (Signed)
Dr Everlene Farrier, in your notes from 12/27 you wrote that pt is here for a regular check up, but I don't see any of the chronic Dxs listed. Wasn't sure if you want to RF this or if pt needs to come back in for med refill check up?

## 2015-08-14 ENCOUNTER — Encounter: Payer: Self-pay | Admitting: Adult Health

## 2015-08-14 ENCOUNTER — Ambulatory Visit (INDEPENDENT_AMBULATORY_CARE_PROVIDER_SITE_OTHER): Payer: BC Managed Care – PPO | Admitting: Adult Health

## 2015-08-14 VITALS — BP 157/83 | HR 73 | Ht 62.0 in | Wt 216.0 lb

## 2015-08-14 DIAGNOSIS — G35 Multiple sclerosis: Secondary | ICD-10-CM | POA: Diagnosis not present

## 2015-08-14 NOTE — Progress Notes (Signed)
PATIENT: Morgan Espinoza DOB: [birth year]  REASON FOR VISIT: follow up- multiple sclerosis HISTORY FROM: patient  HISTORY OF PRESENT ILLNESS: Morgan Espinoza is a 65 year old female with a history of multiple sclerosis. She returns today for follow-up. She continues to take Betaseron and tolerates it well. She states that she is doing well with no new symptoms. She denies any new numbness or weakness. Denies any changes with her gait or balance. Denies any changes with the bowels or bladder. No changes in her vision. She does state that she's not been exercising as regularly as she has in the past. The patient reports that she would still like to hold off on an MRI due to financial reasons. She denies any new medical issues. She returns today for an evaluation.  HISTORY 02/08/15: Morgan Espinoza is a 65 year old female with a history of multiple sclerosis. She returns today for follow-up. She continues to take Betaseron and tolerating it well. She denies any new symptoms. No changes in her gait or balance. Denies any new numbness or weakness. No changes with her bowels or bladder. No change in her vision. She continues to exercise regularly. It has been several years since the patient had an MRI. However she states that she cannot afford to pay for that now. He turns today for an evaluation.  HISTORY 08/01/2014:Morgan Espinoza is a 65 year old female with a history of multiple sclerosis. She returns today for follow-up. She is currently taking Betaseron every other day and tolerating it well. Denies any new numbness or weakness. No changes in gait or balance. Denies any falls. Denies any changes with her bowels or bladder. No changes with her vision. She goes to the gym three times a week. No new medical history since last seen.   HISTORY 01/30/14 (CM): Morgan Espinoza, 64 year old female returns for followup.She has a history of multiple sclerosis. She has had no relapses since 2002, currently on Betaseron every other day  tolerating the medication without any injection site problems. She pretreats site with ice . She is Retired. She denies any double vision, loss of vision, sensory changes, loss of bowel or bladder control. She has intermittent urinary frequency and has a history of asthma. She has had no falls, no dizziness, no balance issues. No new neurologic complaints.  REVIEW OF SYSTEMS: Out of a complete 14 system review of symptoms, the patient complains only of the following symptoms, and all other reviewed systems are negative.  See history of present illness  ALLERGIES: Allergies  Allergen Reactions  . Celebrex [Celecoxib]   . Dristan Other (See Comments)    "made my chest tight"  . Robitussin (Alcohol Free) [Guaifenesin] Other (See Comments)    "made my chest tight"  . Ropinirole Hcl Other (See Comments)    "made my chest tight"  . Tramadol Other (See Comments)    "made my chest tight"    HOME MEDICATIONS: Outpatient Prescriptions Prior to Visit  Medication Sig Dispense Refill  . acetaminophen (TYLENOL) 325 MG tablet Take 650 mg by mouth every 6 (six) hours as needed.    Marland Kitchen albuterol (PROVENTIL HFA;VENTOLIN HFA) 108 (90 BASE) MCG/ACT inhaler Inhale 2 puffs into the lungs every 6 (six) hours as needed for wheezing or shortness of breath. 1 Inhaler 6  . amLODipine (NORVASC) 10 MG tablet TAKE ONE TABLET BY MOUTH ONCE DAILY 90 tablet 1  . aspirin 325 MG tablet Take 325 mg by mouth daily.    Marland Kitchen atorvastatin (LIPITOR) 10 MG tablet  TAKE ONE TABLET BY MOUTH ONCE DAILY 90 tablet 1  . docusate sodium (COLACE) 100 MG capsule Take 100 mg by mouth as needed for constipation.    . ferrous sulfate 325 (65 FE) MG tablet take 1 tablet by mouth once daily WITH BREAKFAST 30 tablet 11  . HYDROcodone-acetaminophen (NORCO/VICODIN) 5-325 MG tablet Take 1 tablet by mouth every 12 (twelve) hours. 15 tablet 0  . Interferon Beta-1b (BETASERON) 0.3 MG KIT injection Inject 0.25 mg into the skin every other day. 1 kit 6    . loratadine (CLARITIN) 10 MG tablet Take 10 mg by mouth daily.    Marland Kitchen losartan (COZAAR) 50 MG tablet Take 1 tablet (50 mg total) by mouth daily. 30 tablet 5  . metoprolol succinate (TOPROL-XL) 25 MG 24 hr tablet Take 25 mg by mouth daily.     Marland Kitchen omeprazole (PRILOSEC) 20 MG capsule TAKE ONE CAPSULE BY MOUTH ONCE DAILY 30 capsule 4  . ranitidine (ZANTAC) 150 MG tablet TAKE ONE TABLET BY MOUTH TWICE DAILY 60 tablet 5  . sucralfate (CARAFATE) 1 GM/10ML suspension TAKE  10 ML BY MOUTH 4 TIMES A DAY, 1 HR BEFORE MEALS AND AT BEDTIME. 1260 mL 2  . VOLTAREN 1 % GEL apply 2 grams to THE LEFT KNEE three times a day 100 g 2   Facility-Administered Medications Prior to Visit  Medication Dose Route Frequency Provider Last Rate Last Dose  . 0.9 %  sodium chloride infusion   Intravenous Continuous [name], MD   Stopped at 11/30/13 1216  . albuterol (PROVENTIL) (2.5 MG/3ML) 0.083% nebulizer solution 2.5 mg  2.5 mg Nebulization Once Darlyne Russian, MD      . albuterol (PROVENTIL) (2.5 MG/3ML) 0.083% nebulizer solution 2.5 mg  2.5 mg Nebulization Once Darlyne Russian, MD      . cyanocobalamin ((VITAMIN B-12)) injection 1,000 mcg  1,000 mcg Intramuscular Q30 days Tishira R Brewington, PA-C   1,000 mcg at 03/11/15 1038    PAST MEDICAL HISTORY: Past Medical History  Diagnosis Date  . Hypertension   . GERD (gastroesophageal reflux disease)   . Asthma   . Multiple sclerosis ([place])   . Hyperlipidemia   . Vertigo   . Vitamin D deficiency   . Obesity   . Achilles tendonitis   . OA (osteoarthritis) of knee   . Anemia, iron deficiency 07/23/2012  . 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 Cancer ([place]) 05/2012    plasma cell myeloma  . S/P radiation therapy 08/12/12 - 09/21/12    left Anterior 2nd Rib Lesion / 50.4 GY / 28 Fractions    PAST SURGICAL HISTORY: Past Surgical History  Procedure Laterality Date  . Cholecystectomy      lap choley  . Abdominal hysterectomy      fibroids  .  Breast surgery      left breast biopsy-benign  . Rotator cuff repair    . Bone biopsy  05/17/12    Left 2nd Rib, Plasma Cell Myeloma    FAMILY HISTORY: Family History  Problem Relation Age of Onset  . Dementia Mother   . Hypertension Mother   . Cancer Father     lung; +tobacco  . Diabetes Sister   . Cancer Brother   . Diabetes Sister     SOCIAL HISTORY: Social History   Social History  . Marital Status: Widowed    Spouse Name: N/A  . Number of Children: 1  . Years  of Education: 12   Occupational History  .    Marland [employer] Retired     Social History Main Topics  . Smoking status: Former Smoker -- 0.10 packs/day for 2 years    Types: Cigarettes    Start date: 10/05/1976    Quit date: 04/19/1979  . Smokeless tobacco: Never Used     Comment: quit 35 years ago   . Alcohol Use: No  . Drug Use: No  . Sexual Activity: No   Other Topics Concern  . 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    Patient is retired.    Patient is widowed.    Patient is right handed.    Patient has a high school education.    Patient has 1 child.       PHYSICAL EXAM  Filed Vitals:   08/14/15 0811  BP: 157/83  Pulse: 73  Height: _0  (1.575 m)  Weight: 216 lb (97.977 kg)   Body mass index is 39.5 kg/(m^2).  Generalized: Well developed, in no acute distress   Neurological examination  Mentation: Alert oriented to time, place, history taking. Follows all commands speech and language fluent Cranial nerve II-XII: Pupils were equal round reactive to light. Extraocular movements were full, visual field were full on confrontational test. Facial sensation and strength were normal. Uvula tongue midline. Head turning and shoulder shrug  were normal and symmetric. Motor: The motor testing reveals 5 over 5 strength of all 4 extremities. Good symmetric motor tone is noted throughout.  Sensory: Sensory testing is intact to soft touch on  all 4 extremities. No evidence of extinction is noted.  Coordination: Cerebellar testing reveals good finger-nose-finger and heel-to-shin bilaterally.  Gait and station: Gait is normal. Tandem gait is normal. Romberg is negative. No drift is seen.  Reflexes: Deep tendon reflexes are symmetric and normal bilaterally.   DIAGNOSTIC DATA (LABS, IMAGING, TESTING) - I reviewed patient records, labs, notes, testing and imaging myself where available.     ASSESSMENT AND PLAN 65 y.o. year old female  has a past medical history of Hypertension; GERD (gastroesophageal reflux disease); Asthma; Multiple sclerosis ([place]); Hyperlipidemia; Vertigo; Vitamin D deficiency; Obesity; Achilles tendonitis; OA (osteoarthritis) of knee; Anemia, iron deficiency (07/23/2012); Plasma cell myeloma (HCC) (05/17/12); Cancer [facility]) (05/2012); and S/P radiation therapy (08/12/12 - 09/21/12). here with:  1. Multiple sclerosis  Overall the patient is doing well. She will continue on Betaseron. The patient recently had blood work in December which was unremarkable. The patient would like to defer on an MRI at this time. Patient advised that if her symptoms worsen or she develops any new symptoms she should let us know. She will follow-up in 6 months with Dr. Krista Blue or sooner if needed.     Ward Givens, MSN, NP-C 08/14/2015, 8:26 AM [facility] [address] [place], [place] [address] [phone]

## 2015-08-14 NOTE — Progress Notes (Signed)
I have reviewed and agreed above plan. 

## 2015-08-14 NOTE — Patient Instructions (Signed)
Continue Betaserson If your symptoms worsen or you develop new symptoms please let us know.

## 2015-08-23 ENCOUNTER — Telehealth: Payer: Self-pay | Admitting: Adult Health

## 2015-08-23 MED ORDER — INTERFERON BETA-1B 0.3 MG ~~LOC~~ KIT: 0.2500 mg | PACK | SUBCUTANEOUS | Status: DC

## 2015-08-23 NOTE — Telephone Encounter (Signed)
Patient is calling. She needs a new Rx called in for Interferon Beta-1b (BETASERON) 0.3 MG KIT injection called to [facility] [phone]. Thank you.

## 2015-08-23 NOTE — Telephone Encounter (Signed)
I called the number provided.  Spoke with [employer].  She asked that we send Rx to [facility].  Rx has been sent.  Receipt confirmed by pharmacy.

## 2015-08-26 ENCOUNTER — Telehealth: Payer: Self-pay | Admitting: Family Medicine

## 2015-08-26 NOTE — Telephone Encounter (Signed)
lmom to call and reschedule appt with daub 

## 2015-09-25 ENCOUNTER — Other Ambulatory Visit: Payer: Self-pay | Admitting: Family Medicine

## 2015-10-03 ENCOUNTER — Other Ambulatory Visit: Payer: Self-pay | Admitting: Neurology

## 2015-10-06 ENCOUNTER — Other Ambulatory Visit: Payer: Self-pay | Admitting: Emergency Medicine

## 2015-10-07 ENCOUNTER — Ambulatory Visit (INDEPENDENT_AMBULATORY_CARE_PROVIDER_SITE_OTHER): Payer: BC Managed Care – PPO | Admitting: Emergency Medicine

## 2015-10-07 VITALS — BP 142/78 | HR 76 | Temp 98.0°F | Resp 14 | Ht 62.0 in | Wt 213.0 lb

## 2015-10-07 DIAGNOSIS — H269 Unspecified cataract: Secondary | ICD-10-CM

## 2015-10-07 DIAGNOSIS — H53132 Sudden visual loss, left eye: Secondary | ICD-10-CM | POA: Diagnosis not present

## 2015-10-07 DIAGNOSIS — R490 Dysphonia: Secondary | ICD-10-CM

## 2015-10-07 MED ORDER — MONTELUKAST SODIUM 10 MG PO TABS: 10.0000 mg | ORAL_TABLET | Freq: Every day | ORAL | Status: DC

## 2015-10-07 NOTE — Progress Notes (Addendum)
This chart was scribed for Morgan Jordan, MD by [facility], medical scribe at [facility].The patient was seen in exam room 12 and the patient's care was started at 11:53 AM.  Chief Complaint:  Chief Complaint  Patient presents with  . Eye Pain    left eye blurry vision yesterday  . Neck Pain    right neck pain yesterday   HPI: Morgan Espinoza is a 65 y.o. female who reports to [facility] today complaining of blurry vision in her left eye since yesterday. This has improved today. Dr. Sabra Heck is her ophthalmologist. She denies any eye pain. She has lost her voice due to allergies, she says this occurs every year around this time. Denies heartburn.   Visual Acuity Screening   Right eye Left eye Both eyes  Without correction:     With correction: 20/20-1 20/20-1    Past Medical History  Diagnosis Date  . Hypertension   . GERD (gastroesophageal reflux disease)   . Asthma   . Multiple sclerosis ([place])   . Hyperlipidemia   . Vertigo   . Vitamin D deficiency   . Obesity   . Achilles tendonitis   . OA (osteoarthritis) of knee   . Anemia, iron deficiency 07/23/2012  . 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 Cancer ([place]) 05/2012    plasma cell myeloma  . S/P radiation therapy 08/12/12 - 09/21/12    left Anterior 2nd Rib Lesion / 50.4 GY / 28 Fractions   Past Surgical History  Procedure Laterality Date  . Cholecystectomy      lap choley  . Abdominal hysterectomy      fibroids  . Breast surgery      left breast biopsy-benign  . Rotator cuff repair    . Bone biopsy  05/17/12    Left 2nd Rib, Plasma Cell Myeloma   Social History   Social History  . Marital Status: Widowed    Spouse Name: N/A  . Number of Children: 1  . Years of Education: 12   Occupational History  .    [employer] Retired     Social History Main Topics  . Smoking status: Former Smoker -- 0.10 packs/day for 2 years    Types: Cigarettes    Start date: 10/05/1976    Quit date: 04/19/1979    . Smokeless tobacco: Never Used     Comment: quit 35 years ago   . Alcohol Use: No  . Drug Use: No  . Sexual Activity: No   Other Topics Concern  . None   Social History Narrative   Retired: Bus Driver for 28 years   Patient lives at home alone.    Patient is retired.    Patient is widowed.    Patient is right handed.    Patient has a high school education.    Patient has 1 child.    Family History  Problem Relation Age of Onset  . Dementia Mother   . Hypertension Mother   . Cancer Father     lung; +tobacco  . Diabetes Sister   . Cancer Brother   . Diabetes Sister    Allergies  Allergen Reactions  . Celebrex [Celecoxib]   . Dristan Other (See Comments)    "made my chest tight"  . Robitussin (Alcohol Free) [Guaifenesin] Other (See Comments)    "made my chest tight"  . Ropinirole Hcl Other (See Comments)    "made  my chest tight"  . Tramadol Other (See Comments)    "made my chest tight"   Prior to Admission medications   Medication Sig Start Date End Date Taking? Authorizing Provider  acetaminophen (TYLENOL) 325 MG tablet Take 650 mg by mouth every 6 (six) hours as needed.   Yes Historical Provider, MD  albuterol (PROVENTIL HFA;VENTOLIN HFA) 108 (90 BASE) MCG/ACT inhaler Inhale 2 puffs into the lungs every 6 (six) hours as needed for wheezing or shortness of breath. 11/07/14  Yes Orma Flaming, MD  amLODipine (NORVASC) 10 MG tablet TAKE ONE TABLET BY MOUTH ONCE DAILY 08/09/15  Yes Darlyne Russian, MD  aspirin 325 MG tablet Take 325 mg by mouth daily.   Yes Historical Provider, MD  atorvastatin (LIPITOR) 10 MG tablet TAKE ONE TABLET BY MOUTH ONCE DAILY 08/09/15  Yes Darlyne Russian, MD  docusate sodium (COLACE) 100 MG capsule Take 100 mg by mouth as needed for constipation.   Yes Historical Provider, MD  ferrous sulfate 325 (65 FE) MG tablet TAKE ONE TABLET BY MOUTH ONCE DAILY WITH  BREAKFAST 09/26/15  Yes Wardell Honour, MD  HYDROcodone-acetaminophen (NORCO/VICODIN) 5-325 MG  tablet Take 1 tablet by mouth every 12 (twelve) hours. 07/11/15  Yes Robyn Haber, MD  Interferon Beta-1b (BETASERON) 0.3 MG KIT injection Inject 0.25 mg into the skin every other day. 08/23/15  Yes Ward Givens, NP  loratadine (CLARITIN) 10 MG tablet Take 10 mg by mouth daily.   Yes Historical Provider, MD  losartan (COZAAR) 50 MG tablet Take 1 tablet (50 mg total) by mouth daily. 09/28/14  Yes Darlyne Russian, MD  metoprolol succinate (TOPROL-XL) 25 MG 24 hr tablet Take 25 mg by mouth daily.  04/04/13  Yes Historical Provider, MD  omeprazole (PRILOSEC) 20 MG capsule TAKE ONE CAPSULE BY MOUTH ONCE DAILY 12/07/14  Yes Orma Flaming, MD  ranitidine (ZANTAC) 150 MG tablet TAKE ONE TABLET BY MOUTH TWICE DAILY 11/13/14  Yes Tereasa Coop, PA-C  sucralfate (CARAFATE) 1 GM/10ML suspension TAKE  10 ML BY MOUTH 4 TIMES A DAY, 1 HR BEFORE MEALS AND AT BEDTIME. 06/15/15  Yes Roselee Culver, MD  VOLTAREN 1 % GEL apply 2 grams to THE LEFT KNEE three times a day 09/07/14  Yes Darlyne Russian, MD    ROS: The patient denies fevers, chills, night sweats, unintentional weight loss, chest pain, palpitations, wheezing, dyspnea on exertion, nausea, vomiting, abdominal pain, dysuria, hematuria, melena, numbness, weakness, or tingling.  All other systems have been reviewed and were otherwise negative with the exception of those mentioned in the HPI and as above.    PHYSICAL EXAM: Filed Vitals:   10/07/15 1103  BP: 142/78  Pulse: 76  Temp: 98 F (36.7 C)  Resp: 14   Body mass index is 38.95 kg/(m^2).  General: Alert, no acute distress HEENT:  Normocephalic, atraumatic, oropharynx patent. Hoarseness.  Eye: [name] Cardiovascular:  Regular rate and rhythm, no rubs murmurs or gallops.  No Carotid bruits, radial pulse intact. No pedal edema.  Respiratory: Clear to auscultation bilaterally.  No wheezes, rales, or rhonchi.  No cyanosis, no use of accessory musculature Abdominal: No organomegaly, abdomen is  soft and non-tender, positive bowel sounds.  No masses. Musculoskeletal: Gait intact. No edema, tenderness Skin: No rashes. Neurologic: Facial musculature symmetric. Psychiatric: Patient acts appropriately throughout our interaction. Lymphatic: No cervical or submandibular lymphadenopathy Genitourinary/Anorectal: No acute findings   LABS:   EKG/XRAY:   Primary read interpreted by [facility]  [facility]ie [facility]line at [facility].  ASSESSMENT/PLAN: Referral made for carotid Doppler and evaluation by Dr. Katy Fitch.. She also has hoarseness which seems to come and go. We'll give a trial of Singulair at night and see if that helps. She is also referred to ENT for their evaluation.I personally performed the services described in this documentation, which was scribed in my presence. The recorded information has been reviewed and is accurate. Gross sideeffects, risk and benefits, and alternatives of medications d/w patient. Patient is aware that all medications have potential sideeffects and we are unable to predict every sideeffect or drug-drug interaction that may occur.  By signing my name below, I, Nadim Abuhashem, attest that this documentation has been prepared under the direction and in the presence of Morgan Jordan, MD.  Electronically Signed: Lora Havens, medical scribe. 10/07/2015, 12:06PM.  [name] 10/07/2015 12:14 PM

## 2015-10-07 NOTE — Patient Instructions (Signed)
I have started you on a medicine to help with hoarseness. I have made you ia referral to see the eye doctor and have an ultrasound of your carotids. I have made you a referral to ENT to look at your larynx.

## 2015-10-09 ENCOUNTER — Other Ambulatory Visit: Payer: Self-pay

## 2015-10-09 DIAGNOSIS — Z1231 Encounter for screening mammogram for malignant neoplasm of breast: Secondary | ICD-10-CM

## 2015-10-15 ENCOUNTER — Ambulatory Visit
Admission: RE | Admit: 2015-10-15 | Discharge: 2015-10-15 | Disposition: A | Discharge status: Home / Self Care | Payer: BC Managed Care – PPO | Source: Ambulatory Visit | Attending: Emergency Medicine | Admitting: Emergency Medicine

## 2015-10-15 DIAGNOSIS — H53132 Sudden visual loss, left eye: Secondary | ICD-10-CM

## 2015-10-18 ENCOUNTER — Telehealth: Payer: Self-pay | Admitting: Neurology

## 2015-10-18 MED ORDER — INTERFERON BETA-1B 0.3 MG ~~LOC~~ KIT: 0.2500 mg | PACK | SUBCUTANEOUS | Status: DC

## 2015-10-18 NOTE — Telephone Encounter (Signed)
Pt has changed pharmacies and is requesting a new rx for Interferon Beta-1b (BETASERON) 0.3 MG KIT injection be sent to the [facility] mail order pharm. Phone: [phone]. Thank you

## 2015-10-18 NOTE — Telephone Encounter (Signed)
Rx sent to the requested pharmacy.

## 2015-10-25 ENCOUNTER — Other Ambulatory Visit: Payer: Self-pay

## 2015-10-25 MED ORDER — SUCRALFATE 1 GM/10ML PO SUSP: ORAL | Status: DC

## 2015-10-25 NOTE — Telephone Encounter (Signed)
Pharm reqs RFs of carafate. Dr Everlene Farrier, you saw pt last month and stated that pt reports no problem with heartburn, but don't see this med discussed. OK to give RFs?

## 2015-10-30 ENCOUNTER — Ambulatory Visit: Payer: BC Managed Care – PPO | Admitting: Emergency Medicine

## 2015-11-01 ENCOUNTER — Encounter: Payer: Self-pay | Admitting: Hematology & Oncology

## 2015-11-01 ENCOUNTER — Ambulatory Visit (HOSPITAL_BASED_OUTPATIENT_CLINIC_OR_DEPARTMENT_OTHER): Payer: BC Managed Care – PPO | Admitting: Hematology & Oncology

## 2015-11-01 ENCOUNTER — Other Ambulatory Visit (HOSPITAL_BASED_OUTPATIENT_CLINIC_OR_DEPARTMENT_OTHER): Payer: BC Managed Care – PPO

## 2015-11-01 ENCOUNTER — Other Ambulatory Visit: Payer: Self-pay | Admitting: Family Medicine

## 2015-11-01 ENCOUNTER — Ambulatory Visit (HOSPITAL_BASED_OUTPATIENT_CLINIC_OR_DEPARTMENT_OTHER): Payer: BC Managed Care – PPO

## 2015-11-01 VITALS — BP 147/70 | HR 54 | Temp 97.6°F | Resp 16 | Ht 62.0 in | Wt 213.0 lb

## 2015-11-01 DIAGNOSIS — C9001 Multiple myeloma in remission: Secondary | ICD-10-CM

## 2015-11-01 DIAGNOSIS — C9000 Multiple myeloma not having achieved remission: Secondary | ICD-10-CM

## 2015-11-01 DIAGNOSIS — D509 Iron deficiency anemia, unspecified: Secondary | ICD-10-CM

## 2015-11-01 DIAGNOSIS — C9030 Solitary plasmacytoma not having achieved remission: Secondary | ICD-10-CM

## 2015-11-01 LAB — COMPREHENSIVE METABOLIC PANEL
ALT: 21 U/L (ref 0–55)
AST: 26 U/L (ref 5–34)
Albumin: 3.5 g/dL (ref 3.5–5.0)
Alkaline Phosphatase: 74 U/L (ref 40–150)
Anion Gap: 9 mEq/L (ref 3–11)
BUN: 15.5 mg/dL (ref 7.0–26.0)
CO2: 27 meq/L (ref 22–29)
Calcium: 9.2 mg/dL (ref 8.4–10.4)
Chloride: 108 mEq/L (ref 98–109)
Creatinine: 0.8 mg/dL (ref 0.6–1.1)
EGFR: >90 mL/min/{1.73_m2} (ref >90)
GLUCOSE: 87 mg/dL (ref 70–140)
POTASSIUM: 3.8 meq/L (ref 3.5–5.1)
SODIUM: 144 meq/L (ref 136–145)
TOTAL PROTEIN: 7.5 g/dL (ref 6.4–8.3)
Total Bilirubin: 0.48 mg/dL (ref 0.20–1.20)

## 2015-11-01 LAB — CBC WITH DIFFERENTIAL (CANCER CENTER ONLY)
BASO#: 0 10*3/uL (ref 0.0–0.2)
BASO%: 0.2 % (ref 0.0–2.0)
EOS ABS: 0.7 10*3/uL — AB (ref 0.0–0.5)
EOS%: 12 % — ABNORMAL HIGH (ref 0.0–7.0)
HCT: 37.3 % (ref 34.8–46.6)
HGB: 12.2 g/dL (ref 11.6–15.9)
LYMPH#: 1.3 10*3/uL (ref 0.9–3.3)
LYMPH%: 22.5 % (ref 14.0–48.0)
MCH: 30.9 pg (ref 26.0–34.0)
MCHC: 32.7 g/dL (ref 32.0–36.0)
MCV: 94 fL (ref 81–101)
MONO#: 0.5 10*3/uL (ref 0.1–0.9)
MONO%: 7.7 % (ref 0.0–13.0)
NEUT#: 3.3 10*3/uL (ref 1.5–6.5)
NEUT%: 57.6 % (ref 39.6–80.0)
PLATELETS: 172 10*3/uL (ref 145–400)
RBC: 3.95 10*6/uL (ref 3.70–5.32)
RDW: 12.3 % (ref 11.1–15.7)
WBC: 5.8 10*3/uL (ref 3.9–10.0)

## 2015-11-01 MED ORDER — ZOLEDRONIC ACID 4 MG/100ML IV SOLN
4.0000 mg | Freq: Once | INTRAVENOUS | Status: AC
  Administered 2015-11-01: 4 mg via INTRAVENOUS
  Filled 2015-11-01: qty 100

## 2015-11-01 NOTE — Progress Notes (Signed)
Ok to give Zometa without CMET per Dr. Marin Olp.

## 2015-11-01 NOTE — Patient Instructions (Signed)

## 2015-11-01 NOTE — Progress Notes (Signed)
Hematology and Oncology Follow Up Visit  Morgan Espinoza [id number] 1951/04/30 65 y.o. 11/01/2015   Principle Diagnosis:   IgG Kappa plasmacytoma  Iron deficiency anemia  Current Therapy:    Zometa 4 mg IV q. 6 months       IV iron-last received Feraheme in January 2015 with a dose of 1020 mg     Interim History:  [name] is back for followup. We see her every 3 months. So far, there has been no problems with respect to her myeloma. Her last myeloma studies back in December showed a faint monoclonal spike. Otherwise, there is no definitive evidence of myelomatous protein. Her IgG level was 1200 mg/dL. Her Kappa Light chain was 3.18 mg/dL.   She's had no problems with pain. She had no problem with infections. His been no change in bowel or bladder habits. She had no cough. She's had no leg swelling. She's had no bleeding or bruising.  Overall, her performance status is ECOG 1.   Medications:  Current outpatient prescriptions:  .  acetaminophen (TYLENOL) 325 MG tablet, Take 650 mg by mouth every 6 (six) hours as needed., Disp: , Rfl:  .  albuterol (PROVENTIL HFA;VENTOLIN HFA) 108 (90 BASE) MCG/ACT inhaler, Inhale 2 puffs into the lungs every 6 (six) hours as needed for wheezing or shortness of breath., Disp: 1 Inhaler, Rfl: 6 .  amLODipine (NORVASC) 10 MG tablet, TAKE ONE TABLET BY MOUTH ONCE DAILY, Disp: 90 tablet, Rfl: 1 .  aspirin 325 MG tablet, Take 325 mg by mouth daily., Disp: , Rfl:  .  atorvastatin (LIPITOR) 10 MG tablet, TAKE ONE TABLET BY MOUTH ONCE DAILY, Disp: 90 tablet, Rfl: 1 .  docusate sodium (COLACE) 100 MG capsule, Take 100 mg by mouth as needed for constipation., Disp: , Rfl:  .  ferrous sulfate 325 (65 FE) MG tablet, TAKE ONE TABLET BY MOUTH ONCE DAILY WITH  BREAKFAST, Disp: 30 tablet, Rfl: 0 .  HYDROcodone-acetaminophen (NORCO/VICODIN) 5-325 MG tablet, Take 1 tablet by mouth every 12 (twelve) hours., Disp: 15 tablet, Rfl: 0 .  Interferon Beta-1b (BETASERON) 0.3 MG  KIT injection, Inject 0.25 mg into the skin every other day., Disp: 1 kit, Rfl: 11 .  loratadine (CLARITIN) 10 MG tablet, Take 10 mg by mouth daily., Disp: , Rfl:  .  losartan (COZAAR) 50 MG tablet, TAKE ONE TABLET BY MOUTH ONCE DAILY, Disp: 30 tablet, Rfl: 0 .  metoprolol succinate (TOPROL-XL) 25 MG 24 hr tablet, Take 25 mg by mouth daily. , Disp: , Rfl:  .  montelukast (SINGULAIR) 10 MG tablet, Take 1 tablet (10 mg total) by mouth at bedtime., Disp: 30 tablet, Rfl: 3 .  omeprazole (PRILOSEC) 20 MG capsule, TAKE ONE CAPSULE BY MOUTH ONCE DAILY, Disp: 30 capsule, Rfl: 4 .  ranitidine (ZANTAC) 150 MG tablet, TAKE ONE TABLET BY MOUTH TWICE DAILY, Disp: 60 tablet, Rfl: 5 .  sucralfate (CARAFATE) 1 GM/10ML suspension, TAKE  10 ML BY MOUTH 4 TIMES A DAY, 1 HR BEFORE MEALS AND AT BEDTIME., Disp: 1260 mL, Rfl: 5 .  VOLTAREN 1 % GEL, apply 2 grams to THE LEFT KNEE three times a day, Disp: 100 g, Rfl: 2 No current facility-administered medications for this visit.  Facility-Administered Medications Ordered in Other Visits:  .  0.9 %  sodium chloride infusion, , Intravenous, Continuous, [name], MD, Stopped at 11/30/13 1216  Allergies:  Allergies  Allergen Reactions  . Celebrex [Celecoxib]   . Dristan Other (See Comments)    "  made my chest tight"  . Robitussin (Alcohol Free) [Guaifenesin] Other (See Comments)    "made my chest tight"  . Ropinirole Hcl Other (See Comments)    "made my chest tight"  . Tramadol Other (See Comments)    "made my chest tight"    Past Medical History, Surgical history, Social history, and Family History were reviewed and updated.  Review of Systems: As above  Physical Exam:  height is '5\' 2"'  (1.575 m) and weight is 213 lb (96.616 kg). Her oral temperature is 97.6 F (36.4 C). Her blood pressure is 147/70 and her pulse is 54. Her respiration is 16.   Well developed well nourished African-American female. Her head and neck exam shows no ocular or oral  lesions. She has no palpable cervical or supraclavicular lymph nodes. Lungs are clear. Cardiac exam shows a regular rate and rhythm with no murmurs rubs or bruits. Abdomen is soft. She has good bowel sounds. There is no fluid wave. There is no palpable liver or spleen tip. Back exam shows no tenderness over the spine ribs or hips. Extremities shows no clubbing cyanosis or edema. Skin exam no rashes, ecchymoses or petechia. Neurological exam is nonfocal.  Lab Results  Component Value Date   WBC 5.8 11/01/2015   HGB 12.2 11/01/2015   HCT 37.3 11/01/2015   MCV 94 11/01/2015   PLT 172 11/01/2015     Chemistry      Component Value Date/Time   NA 146* 08/02/2015 1005   NA 140 08/12/2014 1148   NA 140 01/28/2013 1421   K 3.8 08/02/2015 1005   K 4.1 08/12/2014 1148   CL 104 08/02/2015 1005   CL 104 08/12/2014 1148   CO2 31 08/02/2015 1005   CO2 26 08/12/2014 1148   BUN 15 08/02/2015 1005   BUN 17 08/12/2014 1148   BUN 20 01/28/2013 1421   CREATININE 0.8 08/02/2015 1005   CREATININE 0.70 11/30/2013 0908      Component Value Date/Time   CALCIUM 8.9 08/02/2015 1005   CALCIUM 8.8 08/12/2014 1148   ALKPHOS 68 08/02/2015 1005   ALKPHOS 65 08/12/2014 1148   AST 40* 08/02/2015 1005   AST 24 08/12/2014 1148   ALT 34 08/02/2015 1005   ALT 20 08/12/2014 1148   BILITOT 0.60 08/02/2015 1005   BILITOT 0.4 08/12/2014 1148         Impression and Plan: Morgan Espinoza is 65 year old female with a plasmacytoma. She had radiation for this. This is in the left ribs. This was back in February of 2014  I think at this point, we can consider every 6 month follow-up. I did this to be very reasonable for her.   She is very excited that she will not had come back for 6 months.   [name], MD 3/23/201710:37 AM

## 2015-11-02 LAB — KAPPA/LAMBDA LIGHT CHAINS
IG KAPPA FREE LIGHT CHAIN: 25.4 mg/L — AB (ref 3.30–19.40)
Ig Lambda Free Light Chain: 22.67 mg/L (ref 5.71–26.30)
Kappa/Lambda FluidC Ratio: 1.12 (ref 0.26–1.65)

## 2015-11-02 LAB — IGG, IGA, IGM
IGA/IMMUNOGLOBULIN A, SERUM: 194 mg/dL (ref 87–352)
IgM, Qn, Serum: 66 mg/dL (ref 26–217)

## 2015-11-03 ENCOUNTER — Other Ambulatory Visit: Payer: Self-pay | Admitting: Physician Assistant

## 2015-11-05 LAB — PROTEIN ELECTROPHORESIS, SERUM, WITH REFLEX
A/G Ratio: 1 (ref 0.7–1.7)
ALPHA 2: 0.8 g/dL (ref 0.4–1.0)
Albumin: 3.4 g/dL (ref 2.9–4.4)
Alpha 1: 0.3 g/dL (ref 0.0–0.4)
Beta: 1.1 g/dL (ref 0.7–1.3)
GLOBULIN, TOTAL: 3.4 g/dL (ref 2.2–3.9)
Gamma Globulin: 1.1 g/dL (ref 0.4–1.8)
Total Protein: 6.8 g/dL (ref 6.0–8.5)

## 2015-11-08 ENCOUNTER — Telehealth: Payer: Self-pay | Admitting: *Deleted

## 2015-11-08 ENCOUNTER — Ambulatory Visit (INDEPENDENT_AMBULATORY_CARE_PROVIDER_SITE_OTHER): Payer: BC Managed Care – PPO | Admitting: Emergency Medicine

## 2015-11-08 ENCOUNTER — Encounter: Payer: Self-pay | Admitting: Emergency Medicine

## 2015-11-08 VITALS — BP 132/76 | HR 79 | Temp 98.0°F | Resp 16 | Ht 62.0 in | Wt 212.0 lb

## 2015-11-08 DIAGNOSIS — C9000 Multiple myeloma not having achieved remission: Secondary | ICD-10-CM | POA: Diagnosis not present

## 2015-11-08 DIAGNOSIS — H53132 Sudden visual loss, left eye: Secondary | ICD-10-CM | POA: Diagnosis not present

## 2015-11-08 DIAGNOSIS — G35 Multiple sclerosis: Secondary | ICD-10-CM

## 2015-11-08 NOTE — Progress Notes (Signed)
Patient ID: Morgan Espinoza, female   DOB: 08/22/1950, 64 y.o.   MRN: [id number]    By signing my name below, I, Morgan Espinoza, attest that this documentation has been prepared under the direction and in the presence of  A , MD Electronically Signed: [name], ED Scribe 11/08/2015 at 1:37 PM.  Chief Complaint:  Chief Complaint  Patient presents with  . Follow-up  . Hypertension   HPI: Morgan Espinoza is a 64 y.o. female, with a h/o HTN, hyperlipidemia, MS, who reports to [facility] today for a follow-up regarding HTN.  Pt denies abdominal pain and any other symptoms at this time.   Vision  Pt was seen in the office on 10/07/15 with sudden onset of blurry vision in her left eye. She was sent to see Dr. Groat for emergency consultation. She also had a carotid doppler which was normal. Records will be requested to Dr. Groat.  Memory Pt states that she was experiencing memory loss for a few weeks but states that this has resolved. She attributes memory loss to Carafate, which she is still taking, but suspects that she has adjusted to the medication. Pt further reports that 2 of her brothers experienced the same symptoms at the same time. Pt's mother has a h/o dementia   Multiple Sclerosis  Pt is getting MS treatments every other day.  Labs Pt had a CBC, CMET and protein electrophoresis with elevated IgA Light chain at 25.4 which is followed by oncology.   Past Medical History  Diagnosis Date  . Hypertension   . GERD (gastroesophageal reflux disease)   . Asthma   . Multiple sclerosis ([facility])   . Hyperlipidemia   . Vertigo   . Vitamin D deficiency   . Obesity   . Achilles tendonitis   . OA (osteoarthritis) of knee   . Anemia, iron deficiency 07/23/2012  . 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 Cancer ([facility]) 05/2012    plasma cell myeloma  . S/P radiation therapy 08/12/12 - 09/21/12    left Anterior 2nd Rib Lesion / 50.4 GY / 28 Fractions   Past Surgical History    Procedure Laterality Date  . Cholecystectomy      lap choley  . Abdominal hysterectomy      fibroids  . Breast surgery      left breast biopsy-benign  . Rotator cuff repair    . Bone biopsy  05/17/12    Left 2nd Rib, Plasma Cell Myeloma   Social History   Social History  . Marital Status: Widowed    Spouse Name: N/A  . Number of Children: 1  . Years of Education: 12   Occupational History  .    . Retired     Social History Main Topics  . Smoking status: Former Smoker -- 0.10 packs/day for 2 years    Types: Cigarettes    Start date: 10/05/1976    Quit date: 04/19/1979  . Smokeless tobacco: Never Used     Comment: quit 35 years ago   . Alcohol Use: No  . Drug Use: No  . Sexual Activity: No   Other Topics Concern  . 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    Patient is retired.    Patient is widowed.    Patient is right handed.    Patient has a high school education.      Patient has 1 child.    Family History  Problem Relation Age of Onset  . Dementia Mother   . Hypertension Mother   . Cancer Father     lung; +tobacco  . Diabetes Sister   . Cancer Brother   . Diabetes Sister    Allergies  Allergen Reactions  . Celebrex [Celecoxib]   . Dristan Other (See Comments)    "made my chest tight"  . Robitussin (Alcohol Free) [Guaifenesin] Other (See Comments)    "made my chest tight"  . Ropinirole Hcl Other (See Comments)    "made my chest tight"  . Tramadol Other (See Comments)    "made my chest tight"   Prior to Admission medications   Medication Sig Start Date End Date Taking? Authorizing Provider  acetaminophen (TYLENOL) 325 MG tablet Take 650 mg by mouth every 6 (six) hours as needed.    Historical Provider, MD  albuterol (PROVENTIL HFA;VENTOLIN HFA) 108 (90 BASE) MCG/ACT inhaler Inhale 2 puffs into the lungs every 6 (six) hours as needed for wheezing or shortness of breath. 11/07/14    Orma Flaming, MD  amLODipine (NORVASC) 10 MG tablet TAKE ONE TABLET BY MOUTH ONCE DAILY 08/09/15   Darlyne Russian, MD  aspirin 325 MG tablet Take 325 mg by mouth daily.    Historical Provider, MD  atorvastatin (LIPITOR) 10 MG tablet TAKE ONE TABLET BY MOUTH ONCE DAILY 08/09/15   Darlyne Russian, MD  docusate sodium (COLACE) 100 MG capsule Take 100 mg by mouth as needed for constipation.    Historical Provider, MD  ferrous sulfate 325 (65 FE) MG tablet TAKE ONE TABLET BY MOUTH ONCE DAILY WITH  BREAKFAST 11/04/15   Wardell Honour, MD  HYDROcodone-acetaminophen (NORCO/VICODIN) 5-325 MG tablet Take 1 tablet by mouth every 12 (twelve) hours. 07/11/15   Robyn Haber, MD  Interferon Beta-1b (BETASERON) 0.3 MG KIT injection Inject 0.25 mg into the skin every other day. 10/18/15   Marcial Pacas, MD  loratadine (CLARITIN) 10 MG tablet Take 10 mg by mouth daily.    Historical Provider, MD  losartan (COZAAR) 50 MG tablet TAKE ONE TABLET BY MOUTH ONCE DAILY 10/08/15   Darlyne Russian, MD  metoprolol succinate (TOPROL-XL) 25 MG 24 hr tablet Take 25 mg by mouth daily.  04/04/13   Historical Provider, MD  montelukast (SINGULAIR) 10 MG tablet Take 1 tablet (10 mg total) by mouth at bedtime. 10/07/15   Darlyne Russian, MD  omeprazole (PRILOSEC) 20 MG capsule TAKE ONE CAPSULE BY MOUTH ONCE DAILY 12/07/14   Orma Flaming, MD  ranitidine (ZANTAC) 150 MG tablet TAKE ONE TABLET BY MOUTH TWICE DAILY 11/06/15   Darlyne Russian, MD  sucralfate (CARAFATE) 1 GM/10ML suspension TAKE  10 ML BY MOUTH 4 TIMES A DAY, 1 HR BEFORE MEALS AND AT BEDTIME. 10/25/15   Darlyne Russian, MD  VOLTAREN 1 % GEL apply 2 grams to THE LEFT KNEE three times a day 09/07/14   Darlyne Russian, MD   ROS: The patient denies fevers, chills, night sweats, unintentional weight loss, chest pain, palpitations, wheezing, dyspnea on exertion, nausea, vomiting, abdominal pain, dysuria, hematuria, melena, numbness, weakness, or tingling.   All other systems have been reviewed and  were otherwise negative with the exception of those mentioned in the HPI and as above.    PHYSICAL EXAM: Filed Vitals:   11/08/15 1314  BP: 132/76  Pulse: 79  Temp: 98 F (36.7 C)  Resp: 16  Body mass index is 38.77 kg/(m^2).  General: Alert, no acute distress HEENT:  Normocephalic, atraumatic, oropharynx patent. Eye: Morgan Espinoza [facility] Cardiovascular: Regular rate and rhythm, no rubs murmurs or gallops. No Carotid bruits, radial pulse intact. No pedal edema.  Respiratory: Clear to auscultation bilaterally. No wheezes, rales, or rhonchi. No cyanosis, no use of accessory musculature Abdominal: No organomegaly, abdomen is soft and non-tender, positive bowel sounds. No masses. Musculoskeletal: Gait intact. No edema, tenderness Skin: No rashes. Neurologic: Facial musculature symmetric. Psychiatric: Patient acts appropriately throughout our interaction. Lymphatic: No cervical or submandibular lymphadenopathy  LABS:  EKG/XRAY:   Primary read interpreted by Dr. Everlene Farrier at [facility].  ASSESSMENT/PLAN: Blood work not repeated today. She is doing well on her current medications. She had some issues with memory recently but these issues seem to have resolved. She states other family members were experiencing the same thing. Not clear what this was. No changes will be made in her medications. She has regular visits to her neurologist as well as regular visits to her oncologist. She is up-to-date on immunizations.I personally performed the services described in this documentation, which was scribed in my presence. The recorded information has been reviewed and is accurate.    Gross sideeffects, risk and benefits, and alternatives of medications d/w patient. Patient is aware that all medications have potential sideeffects and we are unable to predict every sideeffect or drug-drug interaction that may occur.  [name] 11/08/2015 1:37 PM

## 2015-11-08 NOTE — Telephone Encounter (Signed)
Faxed signed authorization release form to Dr Zenia Resides office requesting results for office visit on 10/07/2015. Confirmation page received at 3:18 pm

## 2015-11-09 ENCOUNTER — Telehealth: Payer: Self-pay

## 2015-11-09 NOTE — Telephone Encounter (Signed)
Received four pages of requested notes from Dr.  Katy Fitch.  Put in Dr. Caren Griffins box.

## 2015-11-12 ENCOUNTER — Ambulatory Visit
Admission: RE | Admit: 2015-11-12 | Discharge: 2015-11-12 | Disposition: A | Discharge status: Home / Self Care | Payer: BC Managed Care – PPO | Source: Ambulatory Visit

## 2015-11-12 DIAGNOSIS — Z1231 Encounter for screening mammogram for malignant neoplasm of breast: Secondary | ICD-10-CM

## 2015-11-22 ENCOUNTER — Other Ambulatory Visit: Payer: Self-pay | Admitting: Emergency Medicine

## 2015-11-22 ENCOUNTER — Other Ambulatory Visit: Payer: Self-pay | Admitting: Family Medicine

## 2015-11-30 ENCOUNTER — Other Ambulatory Visit: Payer: Self-pay | Admitting: Family Medicine

## 2015-11-30 ENCOUNTER — Other Ambulatory Visit: Payer: Self-pay | Admitting: Internal Medicine

## 2015-11-30 ENCOUNTER — Other Ambulatory Visit: Payer: Self-pay | Admitting: Emergency Medicine

## 2015-12-03 NOTE — Telephone Encounter (Signed)
Pt was just here for check up but don't see asthma discussed recently. OK to RF?

## 2015-12-05 ENCOUNTER — Other Ambulatory Visit: Payer: Self-pay

## 2015-12-05 MED ORDER — ALBUTEROL SULFATE HFA 108 (90 BASE) MCG/ACT IN AERS: 2.0000 | INHALATION_SPRAY | Freq: Four times a day (QID) | RESPIRATORY_TRACT | Status: DC | PRN

## 2015-12-21 ENCOUNTER — Other Ambulatory Visit: Payer: Self-pay

## 2015-12-21 NOTE — Telephone Encounter (Signed)
Dr Everlene Farrier, pt just had check up with you in March and you said no changes in meds, but didn't address GERD. OK to give RFs?

## 2015-12-22 MED ORDER — OMEPRAZOLE 20 MG PO CPDR: 20.0000 mg | DELAYED_RELEASE_CAPSULE | Freq: Every day | ORAL | Status: DC

## 2016-01-05 ENCOUNTER — Other Ambulatory Visit: Payer: Self-pay | Admitting: Family Medicine

## 2016-01-05 ENCOUNTER — Other Ambulatory Visit: Payer: Self-pay | Admitting: Emergency Medicine

## 2016-01-10 ENCOUNTER — Ambulatory Visit (INDEPENDENT_AMBULATORY_CARE_PROVIDER_SITE_OTHER): Payer: Medicare Other | Admitting: Emergency Medicine

## 2016-01-10 ENCOUNTER — Encounter: Payer: Self-pay | Admitting: Emergency Medicine

## 2016-01-10 VITALS — BP 138/80 | HR 97 | Temp 98.5°F | Resp 16 | Ht 61.0 in | Wt 209.0 lb

## 2016-01-10 DIAGNOSIS — W19XXXA Unspecified fall, initial encounter: Secondary | ICD-10-CM | POA: Diagnosis not present

## 2016-01-10 DIAGNOSIS — R030 Elevated blood-pressure reading, without diagnosis of hypertension: Secondary | ICD-10-CM | POA: Diagnosis not present

## 2016-01-10 DIAGNOSIS — E559 Vitamin D deficiency, unspecified: Secondary | ICD-10-CM

## 2016-01-10 DIAGNOSIS — IMO0001 Reserved for inherently not codable concepts without codable children: Secondary | ICD-10-CM

## 2016-01-10 DIAGNOSIS — Z1159 Encounter for screening for other viral diseases: Secondary | ICD-10-CM | POA: Diagnosis not present

## 2016-01-10 DIAGNOSIS — D509 Iron deficiency anemia, unspecified: Secondary | ICD-10-CM

## 2016-01-10 DIAGNOSIS — M79671 Pain in right foot: Secondary | ICD-10-CM

## 2016-01-10 DIAGNOSIS — E785 Hyperlipidemia, unspecified: Secondary | ICD-10-CM

## 2016-01-10 DIAGNOSIS — C9000 Multiple myeloma not having achieved remission: Secondary | ICD-10-CM | POA: Diagnosis not present

## 2016-01-10 DIAGNOSIS — G35 Multiple sclerosis: Secondary | ICD-10-CM | POA: Diagnosis not present

## 2016-01-10 DIAGNOSIS — G35D Multiple sclerosis, unspecified: Secondary | ICD-10-CM

## 2016-01-10 LAB — LIPID PANEL
CHOL/HDL RATIO: 1.8 ratio (ref <=5.0)
Cholesterol: 188 mg/dL (ref 125–200)
HDL: 104 mg/dL (ref >=46)
LDL Cholesterol: 70 mg/dL (ref <130)
Triglycerides: 68 mg/dL (ref <150)
VLDL: 14 mg/dL (ref <30)

## 2016-01-10 LAB — COMPLETE METABOLIC PANEL WITH GFR
ALBUMIN: 3.9 g/dL (ref 3.6–5.1)
ALK PHOS: 71 U/L (ref 33–130)
ALT: 26 U/L (ref 6–29)
AST: 33 U/L (ref 10–35)
BUN: 16 mg/dL (ref 7–25)
CO2: 25 mmol/L (ref 20–31)
Calcium: 8.9 mg/dL (ref 8.6–10.4)
Chloride: 106 mmol/L (ref 98–110)
Creat: 0.68 mg/dL (ref 0.50–0.99)
GFR, Est African American: >89 mL/min (ref >=60)
GFR, Est Non African American: >89 mL/min (ref >=60)
GLUCOSE: 83 mg/dL (ref 65–99)
POTASSIUM: 3.4 mmol/L — AB (ref 3.5–5.3)
SODIUM: 143 mmol/L (ref 135–146)
TOTAL PROTEIN: 6.7 g/dL (ref 6.1–8.1)
Total Bilirubin: 0.4 mg/dL (ref 0.2–1.2)

## 2016-01-10 LAB — CBC WITH DIFFERENTIAL/PLATELET
BASOS ABS: 0 {cells}/uL (ref 0–200)
Basophils Relative: 0 %
EOS ABS: 1608 {cells}/uL — AB (ref 15–500)
EOS PCT: 24 %
HCT: 35.1 % (ref 35.0–45.0)
Hemoglobin: 11.8 g/dL (ref 11.7–15.5)
LYMPHS PCT: 29 %
Lymphs Abs: 1943 cells/uL (ref 850–3900)
MCH: 30.9 pg (ref 27.0–33.0)
MCHC: 33.6 g/dL (ref 32.0–36.0)
MCV: 91.9 fL (ref 80.0–100.0)
MONOS PCT: 6 %
MPV: 11.2 fL (ref 7.5–12.5)
Monocytes Absolute: 402 cells/uL (ref 200–950)
NEUTROS ABS: 2747 {cells}/uL (ref 1500–7800)
Neutrophils Relative %: 41 %
PLATELETS: 147 10*3/uL (ref 140–400)
RBC: 3.82 MIL/uL (ref 3.80–5.10)
RDW: 13.5 % (ref 11.0–15.0)
WBC: 6.7 10*3/uL (ref 3.8–10.8)

## 2016-01-10 LAB — IRON AND TIBC
%SAT: 24 % (ref 11–50)
Iron: 58 ug/dL (ref 45–160)
TIBC: 242 ug/dL — ABNORMAL LOW (ref 250–450)
UIBC: 184 ug/dL (ref 125–400)

## 2016-01-10 LAB — FERRITIN: FERRITIN: 883 ng/mL — AB (ref 20–288)

## 2016-01-10 MED ORDER — AMLODIPINE BESYLATE 10 MG PO TABS: 10.0000 mg | ORAL_TABLET | Freq: Every day | ORAL | Status: DC

## 2016-01-10 MED ORDER — ATORVASTATIN CALCIUM 10 MG PO TABS: 10.0000 mg | ORAL_TABLET | Freq: Every day | ORAL | Status: DC

## 2016-01-10 MED ORDER — RANITIDINE HCL 150 MG PO TABS: 150.0000 mg | ORAL_TABLET | Freq: Two times a day (BID) | ORAL | Status: DC

## 2016-01-10 MED ORDER — HYDROCODONE-ACETAMINOPHEN 5-325 MG PO TABS: 1.0000 | ORAL_TABLET | Freq: Two times a day (BID) | ORAL | Status: DC

## 2016-01-10 NOTE — Progress Notes (Signed)
Patient ID: Morgan Espinoza, female   DOB: 03/13/1951, 65 y.o.   MRN: [id number]    By signing my name below I, Tereasa Coop, attest that this documentation has been prepared under the direction and in the presence of Arlyss Queen, MD. Electonically Signed. Tereasa Coop, Scribe 01/10/2016 at 12:51 PM   Chief Complaint:  Chief Complaint  Patient presents with  . Follow-up    3 mos  . Hypertension  . Medication Refill    HPI: Morgan Espinoza is a 65 y.o. female who reports to [facility] today for a 3 month follow up visit to have her HTN reevaluated. Pt also wishes to discuss new PCP options.   Pt also requests medication refill.    Pt has been lifting logs of wood and is now c/o left neck, left shoulder, and left back pain.   Past Medical History  Diagnosis Date  . Hypertension   . GERD (gastroesophageal reflux disease)   . Asthma   . Multiple sclerosis ([place])   . Hyperlipidemia   . Vertigo   . Vitamin D deficiency   . Obesity   . Achilles tendonitis   . OA (osteoarthritis) of knee   . Anemia, iron deficiency 07/23/2012  . 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 Cancer ([place]) 05/2012    plasma cell myeloma  . S/P radiation therapy 08/12/12 - 09/21/12    left Anterior 2nd Rib Lesion / 50.4 GY / 28 Fractions   Past Surgical History  Procedure Laterality Date  . Cholecystectomy      lap choley  . Abdominal hysterectomy      fibroids  . Breast surgery      left breast biopsy-benign  . Rotator cuff repair    . Bone biopsy  05/17/12    Left 2nd Rib, Plasma Cell Myeloma   Social History   Social History  . Marital Status: Widowed    Spouse Name: N/A  . Number of Children: 1  . Years of Education: 12   Occupational History  .    [employer] Retired     Social History Main Topics  . Smoking status: Former Smoker -- 0.10 packs/day for 2 years    Types: Cigarettes    Start date: 10/05/1976    Quit date: 04/19/1979  . Smokeless tobacco: Never Used     Comment: quit 35 years ago    . Alcohol Use: No  . Drug Use: No  . Sexual Activity: No   Other Topics Concern  . None   Social History Narrative   Retired: Bus Driver for 28 years   Patient lives at home alone.    Patient is retired.    Patient is widowed.    Patient is right handed.    Patient has a high school education.    Patient has 1 child.    Family History  Problem Relation Age of Onset  . Dementia Mother   . Hypertension Mother   . Cancer Father     lung; +tobacco  . Diabetes Sister   . Cancer Brother   . Diabetes Sister    Allergies  Allergen Reactions  . Celebrex [Celecoxib]   . Dristan Other (See Comments)    "made my chest tight"  . Robitussin (Alcohol Free) [Guaifenesin] Other (See Comments)    "made my chest tight"  . Ropinirole Hcl Other (See Comments)    "made my chest tight"  . Tramadol Other (See Comments)    "  made my chest tight"   Prior to Admission medications   Medication Sig Start Date End Date Taking? Authorizing Provider  acetaminophen (TYLENOL) 325 MG tablet Take 650 mg by mouth every 6 (six) hours as needed.   Yes Historical Provider, MD  albuterol (PROVENTIL HFA;VENTOLIN HFA) 108 (90 Base) MCG/ACT inhaler Inhale 2 puffs into the lungs every 6 (six) hours as needed (cough, shortness of breath or wheezing.). 12/05/15  Yes Darlyne Russian, MD  amLODipine (NORVASC) 10 MG tablet TAKE ONE TABLET BY MOUTH ONCE DAILY 11/30/15  Yes Darlyne Russian, MD  aspirin 325 MG tablet Take 325 mg by mouth daily.   Yes Historical Provider, MD  atorvastatin (LIPITOR) 10 MG tablet TAKE ONE TABLET BY MOUTH ONCE DAILY 08/09/15  Yes Darlyne Russian, MD  Ferrous Sulfate (IRON) 325 (65 Fe) MG TABS TAKE 1 TABLET BY MOUTH DAILY WITH BREAKFAST 01/08/16  Yes Darlyne Russian, MD  HYDROcodone-acetaminophen (NORCO/VICODIN) 5-325 MG tablet Take 1 tablet by mouth every 12 (twelve) hours. 07/11/15  Yes Robyn Haber, MD  Interferon Beta-1b (BETASERON) 0.3 MG KIT injection Inject 0.25 mg into the skin every other  day. 10/18/15  Yes Marcial Pacas, MD  losartan (COZAAR) 50 MG tablet TAKE 1 TABLET BY MOUTH DAILY 01/08/16  Yes Darlyne Russian, MD  omeprazole (PRILOSEC) 20 MG capsule Take 1 capsule (20 mg total) by mouth daily. 12/22/15  Yes Darlyne Russian, MD  prednisoLONE acetate (PRED FORTE) 1 % ophthalmic suspension 1 drop 4 (four) times daily.   Yes Historical Provider, MD  ranitidine (ZANTAC) 150 MG tablet TAKE ONE TABLET BY MOUTH TWICE DAILY 11/06/15  Yes Darlyne Russian, MD  sucralfate (CARAFATE) 1 GM/10ML suspension TAKE  10 ML BY MOUTH 4 TIMES A DAY, 1 HR BEFORE MEALS AND AT BEDTIME. 10/25/15  Yes Darlyne Russian, MD  docusate sodium (COLACE) 100 MG capsule Take 100 mg by mouth as needed for constipation. Reported on 01/10/2016    Historical Provider, MD  loratadine (CLARITIN) 10 MG tablet Take 10 mg by mouth daily. Reported on 01/10/2016    Historical Provider, MD  metoprolol succinate (TOPROL-XL) 25 MG 24 hr tablet Take 25 mg by mouth daily.  04/04/13   Historical Provider, MD  montelukast (SINGULAIR) 10 MG tablet Take 1 tablet (10 mg total) by mouth at bedtime. Patient not taking: Reported on 01/10/2016 10/07/15   Darlyne Russian, MD  VOLTAREN 1 % GEL apply 2 grams to THE LEFT KNEE three times a day Patient not taking: Reported on 01/10/2016 09/07/14   Darlyne Russian, MD     ROS: The patient denies fevers, chills, night sweats, unintentional weight loss, chest pain, palpitations, wheezing, dyspnea on exertion, nausea, vomiting, abdominal pain, dysuria, hematuria, melena, numbness, weakness, or tingling. Pt is positive for neck pain, back pain, and left shoulder pain.   All other systems have been reviewed and were otherwise negative with the exception of those mentioned in the HPI and as above.    PHYSICAL EXAM: Filed Vitals:   01/10/16 1100  BP: 138/80  Pulse: 97  Temp: 98.5 F (36.9 C)  Resp: 16   Body mass index is 39.51 kg/(m^2).   General: Alert, no acute distress HEENT:  Normocephalic, atraumatic, oropharynx  patent. Eye: [name] Cardiovascular:  Regular rate and rhythm, no rubs murmurs or gallops.  No Carotid bruits, radial pulse intact. No pedal edema.  Respiratory: Clear to auscultation bilaterally.  No wheezes, rales, or rhonchi.  No cyanosis, no  use of accessory musculature Abdominal: No organomegaly, abdomen is soft and non-tender, positive bowel sounds.  No masses. Musculoskeletal: Gait intact. No edema, tenderness Skin: No rashes. Neurologic: Facial musculature symmetric. Psychiatric: Patient acts appropriately throughout our interaction. Lymphatic: No cervical or submandibular lymphadenopathy     LABS:    EKG/XRAY:   Primary read interpreted by Dr. Everlene Farrier at [facility]   ASSESSMENT/PLAN: All medical problems are stable. She has regular checkups with her multiple myeloma. She has regular checkups regarding her MS and continues with injections of interferon. She is overall doing well she overdid it in the yard recently and is having some neck and shoulder discomfort and was given a few hydrocodone to take for this. Otherwise no changes in medication. She is on iron and will check iron studies today.I personally performed the services described in this documentation, which was scribed in my presence. The recorded information has been reviewed and is accurate.    Gross sideeffects, risk and benefits, and alternatives of medications d/w patient. Patient is aware that all medications have potential sideeffects and we are unable to predict every sideeffect or drug-drug interaction that may occur.  [name] 01/10/2016 11:19 AM

## 2016-01-10 NOTE — Patient Instructions (Signed)
     IF you received an x-ray today, you will receive an invoice from [facility]. Please contact [facility] at [phone] with questions or concerns regarding your invoice.   IF you received labwork today, you will receive an invoice from [facility]. Please contact [facility] at [phone] with questions or concerns regarding your invoice.   Our billing staff will not be able to assist you with questions regarding bills from these companies.  You will be contacted with the lab results as soon as they are available. The fastest way to get your results is to activate your My Chart account. Instructions are located on the last page of this paperwork. If you have not heard from us regarding the results in 2 weeks, please contact this office.      

## 2016-01-11 LAB — HEPATITIS C ANTIBODY: HCV AB: NEGATIVE

## 2016-01-22 ENCOUNTER — Other Ambulatory Visit: Payer: Self-pay

## 2016-01-22 MED ORDER — LOSARTAN POTASSIUM 50 MG PO TABS: 50.0000 mg | ORAL_TABLET | Freq: Every day | ORAL | Status: DC

## 2016-01-29 ENCOUNTER — Ambulatory Visit (INDEPENDENT_AMBULATORY_CARE_PROVIDER_SITE_OTHER): Payer: Medicare Other | Admitting: Emergency Medicine

## 2016-01-29 ENCOUNTER — Ambulatory Visit (INDEPENDENT_AMBULATORY_CARE_PROVIDER_SITE_OTHER): Payer: Medicare Other

## 2016-01-29 VITALS — BP 140/88 | HR 80 | Temp 98.0°F | Resp 18 | Ht 61.0 in | Wt 212.0 lb

## 2016-01-29 DIAGNOSIS — M5441 Lumbago with sciatica, right side: Secondary | ICD-10-CM | POA: Diagnosis not present

## 2016-01-29 DIAGNOSIS — M542 Cervicalgia: Secondary | ICD-10-CM

## 2016-01-29 DIAGNOSIS — C9030 Solitary plasmacytoma not having achieved remission: Secondary | ICD-10-CM | POA: Diagnosis not present

## 2016-01-29 DIAGNOSIS — R490 Dysphonia: Secondary | ICD-10-CM

## 2016-01-29 MED ORDER — GABAPENTIN 100 MG PO CAPS: 100.0000 mg | ORAL_CAPSULE | Freq: Three times a day (TID) | ORAL | Status: DC

## 2016-01-29 NOTE — Progress Notes (Addendum)
Patient ID: Morgan Espinoza, female   DOB: 07-26-1951, 65 y.o.   MRN: [id number]    By signing my name below, I, Essence Howell, attest that this documentation has been prepared under the direction and in the presence of Darlyne Russian, MD Electronically Signed: Ladene Artist, ED Scribe 01/29/2016 at 12:49 PM.  Chief Complaint:  Chief Complaint  Patient presents with  . Abdominal Pain    RIGHT SIDE   HPI: Morgan Espinoza is a 65 y.o. female, with a h/o MS, who reports to [facility] today complaining of gradually worsening right hip pain for the past few days. Pt describes pain as an aching sensation that radiates into her right buttock and is exacerbated with sitting for longer than 30 minutes. She denies injury. Pt has tried hot baths and rubbing alcohol without significant relief.   Chronic Hoarseness  Pt was referred to ENT at her office visit in February 2017 for chronic hoarseness. She has not followed up with ENT yet but states that she will. Pt reports h/o tobacco use as a teenager but states that she "never actually inhaled". Pt reports h/o GERD.   Past Medical History  Diagnosis Date  . Hypertension   . GERD (gastroesophageal reflux disease)   . Asthma   . Multiple sclerosis ([place])   . Hyperlipidemia   . Vertigo   . Vitamin D deficiency   . Obesity   . Achilles tendonitis   . OA (osteoarthritis) of knee   . Anemia, iron deficiency 07/23/2012  . 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 Cancer ([place]) 05/2012    plasma cell myeloma  . S/P radiation therapy 08/12/12 - 09/21/12    left Anterior 2nd Rib Lesion / 50.4 GY / 28 Fractions   Past Surgical History  Procedure Laterality Date  . Cholecystectomy      lap choley  . Abdominal hysterectomy      fibroids  . Breast surgery      left breast biopsy-benign  . Rotator cuff repair    . Bone biopsy  05/17/12    Left 2nd Rib, Plasma Cell Myeloma   Social History   Social History  . Marital Status: Widowed    Spouse Name: N/A    . Number of Children: 1  . Years of Education: 12   Occupational History  .    [employer] Retired     Social History Main Topics  . Smoking status: Former Smoker -- 0.10 packs/day for 2 years    Types: Cigarettes    Start date: 10/05/1976    Quit date: 04/19/1979  . Smokeless tobacco: Never Used     Comment: quit 35 years ago   . Alcohol Use: No  . Drug Use: No  . Sexual Activity: No   Other Topics Concern  . None   Social History Narrative   Retired: Bus Driver for 28 years   Patient lives at home alone.    Patient is retired.    Patient is widowed.    Patient is right handed.    Patient has a high school education.    Patient has 1 child.    Family History  Problem Relation Age of Onset  . Dementia Mother   . Hypertension Mother   . Cancer Father     lung; +tobacco  . Diabetes Sister   . Cancer Brother   . Diabetes Sister    Allergies  Allergen Reactions  . Celebrex [Celecoxib]   .  Dristan Other (See Comments)    "made my chest tight"  . Robitussin (Alcohol Free) [Guaifenesin] Other (See Comments)    "made my chest tight"  . Ropinirole Hcl Other (See Comments)    "made my chest tight"  . Tramadol Other (See Comments)    "made my chest tight"   Prior to Admission medications   Medication Sig Start Date End Date Taking? Authorizing Provider  acetaminophen (TYLENOL) 325 MG tablet Take 650 mg by mouth every 6 (six) hours as needed.   Yes Historical Provider, MD  albuterol (PROVENTIL HFA;VENTOLIN HFA) 108 (90 Base) MCG/ACT inhaler Inhale 2 puffs into the lungs every 6 (six) hours as needed (cough, shortness of breath or wheezing.). 12/05/15  Yes Darlyne Russian, MD  amLODipine (NORVASC) 10 MG tablet Take 1 tablet (10 mg total) by mouth daily. 01/10/16  Yes Darlyne Russian, MD  aspirin 325 MG tablet Take 325 mg by mouth daily.   Yes Historical Provider, MD  atorvastatin (LIPITOR) 10 MG tablet Take 1 tablet (10 mg total) by mouth daily. 01/10/16  Yes Darlyne Russian, MD  docusate  sodium (COLACE) 100 MG capsule Take 100 mg by mouth as needed for constipation. Reported on 01/10/2016   Yes Historical Provider, MD  Ferrous Sulfate (IRON) 325 (65 Fe) MG TABS TAKE 1 TABLET BY MOUTH DAILY WITH BREAKFAST 01/08/16  Yes Darlyne Russian, MD  HYDROcodone-acetaminophen (NORCO/VICODIN) 5-325 MG tablet Take 1 tablet by mouth every 12 (twelve) hours. 01/10/16  Yes Darlyne Russian, MD  Interferon Beta-1b (BETASERON) 0.3 MG KIT injection Inject 0.25 mg into the skin every other day. 10/18/15  Yes Marcial Pacas, MD  loratadine (CLARITIN) 10 MG tablet Take 10 mg by mouth daily. Reported on 01/10/2016   Yes Historical Provider, MD  losartan (COZAAR) 50 MG tablet Take 1 tablet (50 mg total) by mouth daily. 01/22/16  Yes Darlyne Russian, MD  losartan (COZAAR) 50 MG tablet Take 1 tablet (50 mg total) by mouth daily. 01/22/16  Yes Darlyne Russian, MD  metoprolol succinate (TOPROL-XL) 25 MG 24 hr tablet Take 25 mg by mouth daily.  04/04/13  Yes Historical Provider, MD  montelukast (SINGULAIR) 10 MG tablet Take 1 tablet (10 mg total) by mouth at bedtime. 10/07/15  Yes Darlyne Russian, MD  omeprazole (PRILOSEC) 20 MG capsule Take 1 capsule (20 mg total) by mouth daily. 12/22/15  Yes Darlyne Russian, MD  prednisoLONE acetate (PRED FORTE) 1 % ophthalmic suspension 1 drop 4 (four) times daily.   Yes Historical Provider, MD  ranitidine (ZANTAC) 150 MG tablet Take 1 tablet (150 mg total) by mouth 2 (two) times daily. 01/10/16  Yes Darlyne Russian, MD  sucralfate (CARAFATE) 1 GM/10ML suspension TAKE  10 ML BY MOUTH 4 TIMES A DAY, 1 HR BEFORE MEALS AND AT BEDTIME. 10/25/15  Yes Darlyne Russian, MD  VOLTAREN 1 % GEL apply 2 grams to THE LEFT KNEE three times a day 09/07/14  Yes Darlyne Russian, MD   ROS: The patient denies fevers, chills, night sweats, unintentional weight loss, chest pain, palpitations, wheezing, dyspnea on exertion, nausea, vomiting, abdominal pain, dysuria, hematuria, melena, numbness, weakness, or tingling.   All other systems  have been reviewed and were otherwise negative with the exception of those mentioned in the HPI and as above.    PHYSICAL EXAM: Filed Vitals:   01/29/16 1136  BP: 140/88  Pulse: 80  Temp: 98 F (36.7 C)  Resp: 18  Body mass index is 40.08 kg/(m^2).  General: Alert, no acute distress HEENT:  Normocephalic, atraumatic, oropharynx patent. Hoarse. Throat is normal.  Eye: [facility] Cardiovascular: Regular rate and rhythm, no rubs murmurs or gallops. No Carotid bruits, radial pulse intact. No pedal edema.  Respiratory: Clear to auscultation bilaterally. No wheezes, rales, or rhonchi. No cyanosis, no use of accessory musculature Abdominal: No organomegaly, abdomen is soft and non-tender, positive bowel sounds. No masses. Musculoskeletal: Gait intact. No edema. Tender in the R supraclavicular notch. Tender in the R SI joint area.  Skin: No rashes. Neurologic: Facial musculature symmetric. Psychiatric: Patient acts appropriately throughout our interaction. Lymphatic: No cervical or submandibular lymphadenopathy  LABS:  EKG/XRAY:   Primary read interpreted by Dr. Everlene Farrier at [facility]. Dg Chest 2 View  01/29/2016  CLINICAL DATA:  Right upper anterior chest pain EXAM: CHEST  2 VIEW COMPARISON:  06/28/2014 FINDINGS: Cardiomediastinal silhouette is stable. Expansile lesion left second rib is stable from prior exam. No definite new bony lesions are identified. No acute infiltrate or pulmonary edema. Mild degenerative changes mid thoracic spine. IMPRESSION: No active disease. Mild degenerative changes mid thoracic spine. Stable expansile lytic lesion of left second rib. Electronically Signed   By: Lahoma Crocker M.D.   On: 01/29/2016 14:12   Dg Lumbar Spine 2-3 Views  01/29/2016  CLINICAL DATA:  Back pain, did physical work in the gardening 2 days ago EXAM: [place] - 2-3 VIEW COMPARISON:  05/06/2012 FINDINGS: Two views of the lumbar spine submitted. No acute fracture or subluxation. Minimal anterior  spurring lower endplate of L4 and upper endplate of L5. Alignment and vertebral body heights are preserved. There is disc space flattening with vacuum disc phenomenon and mild anterior spurring at L5-S1 level. IMPRESSION: No acute fracture or subluxation. Degenerative changes at L5-S1 level. Electronically Signed   By: Lahoma Crocker M.D.   On: 01/29/2016 14:09    ASSESSMENT/PLAN: Her C-spine films show pre-significant degenerative disc disease at C5-C6. 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 Will try Neurontin 100 mg 3 times a day to see if that helps some with her back pain and sciatica. She also has some tenderness right supraclavicular area but did a lot of lifting last week moving some heavy log and I suspect this is musculoskeletal also. She has significant C5-6 degenerative cervical disc disease.I personally performed the services described in this documentation, which was scribed in my presence. The recorded information has been reviewed and is accurate.    Gross sideeffects, risk and benefits, and alternatives of medications d/w patient. Patient is aware that all medications have potential sideeffects and we are unable to predict every sideeffect or drug-drug interaction that may occur.  [name] 01/29/2016 12:34 PM

## 2016-01-29 NOTE — Patient Instructions (Signed)
     IF you received an x-ray today, you will receive an invoice from [facility]. Please contact [facility] at [phone] with questions or concerns regarding your invoice.   IF you received labwork today, you will receive an invoice from [facility]. Please contact [facility] at [phone] with questions or concerns regarding your invoice.   Our billing staff will not be able to assist you with questions regarding bills from these companies.  You will be contacted with the lab results as soon as they are available. The fastest way to get your results is to activate your My Chart account. Instructions are located on the last page of this paperwork. If you have not heard from us regarding the results in 2 weeks, please contact this office.      

## 2016-02-02 ENCOUNTER — Other Ambulatory Visit: Payer: Self-pay | Admitting: Emergency Medicine

## 2016-02-13 ENCOUNTER — Encounter: Payer: Self-pay | Admitting: Neurology

## 2016-02-13 ENCOUNTER — Ambulatory Visit (INDEPENDENT_AMBULATORY_CARE_PROVIDER_SITE_OTHER): Payer: Medicare Other | Admitting: Neurology

## 2016-02-13 VITALS — BP 130/67 | HR 77 | Ht 61.0 in | Wt 214.5 lb

## 2016-02-13 DIAGNOSIS — G35 Multiple sclerosis: Secondary | ICD-10-CM

## 2016-02-13 NOTE — Progress Notes (Signed)
Chief Complaint  Patient presents with  . Multiple Sclerosis    Feels she is doing well on Betaseron.  No new concerns today.      PATIENT: Morgan Espinoza DOB: Dec 12, 1950   HISTORY OF PRESENT ILLNESS: Morgan Espinoza is a 65 years old right-handed female, follow-up for multiple sclerosis, her primary care physician is Dr. 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 She denies difficulty walking, strokelike symptoms, she denies paresthesia, no gait difficulty, no bowel bladder incontinence.  She has been treated with Betaseron injection since the diagnosis,She tolerates it well, no significant side effect, she has not had flareup for many years.   She also takes medicine for hypertension, hyperlipidemia, iron supplements, stomach problems,  I reviewed laboratory evaluations in 2017 there was no significant abnormality in CMP, CBC, ferritin level, TSH   REVIEW OF SYSTEMS: Out of a complete 14 system review of symptoms, the patient complains only of the following symptoms, and all other symptoms are negative, with in  ALLERGIES: Allergies  Allergen Reactions  . Celebrex [Celecoxib]   . Dristan Other (See Comments)    "made my chest tight"  . Robitussin (Alcohol Free) [Guaifenesin] Other (See Comments)    "made my chest tight"  . Ropinirole Hcl Other (See Comments)    "made my chest tight"  . Tramadol Other (See Comments)    "made my chest tight"    HOME MEDICATIONS: Outpatient Prescriptions Prior to Visit  Medication Sig Dispense Refill  . acetaminophen (TYLENOL) 325 MG tablet Take 650 mg by mouth every 6 (six) hours as needed.    Marland Kitchen albuterol (PROVENTIL HFA;VENTOLIN HFA) 108 (90 Base)  MCG/ACT inhaler Inhale 2 puffs into the lungs every 6 (six) hours as needed (cough, shortness of breath or wheezing.). 1 Inhaler 5  . amLODipine (NORVASC) 10 MG tablet Take 1 tablet (10 mg total) by mouth daily. 90 tablet 3  . aspirin 325 MG tablet Take 325 mg by mouth daily.    Marland Kitchen atorvastatin (LIPITOR) 10 MG tablet Take 1 tablet (10 mg total) by mouth daily. 90 tablet 3  . docusate sodium (COLACE) 100 MG capsule Take 100 mg by mouth as needed for constipation. Reported on 01/10/2016    . ferrous sulfate 325 (65 FE) MG tablet TAKE ONE TABLET BY MOUTH ONCE DAILY WITH BREAKFAST 30 tablet 0  . gabapentin (NEURONTIN) 100 MG capsule Take 1 capsule (100 mg total) by mouth 3 (three) times daily. 90 capsule 3  . Interferon Beta-1b (BETASERON) 0.3 MG KIT injection Inject 0.25 mg into the skin every other day. 1 kit 11  . loratadine (CLARITIN) 10 MG tablet Take 10 mg by mouth daily as needed. Reported on 01/10/2016    . losartan (COZAAR) 50 MG tablet Take 1 tablet (50 mg total) by mouth daily. 90 tablet 1  . omeprazole (PRILOSEC) 20 MG capsule Take 1 capsule (20 mg total) by mouth daily. 90 capsule 3  . prednisoLONE acetate (PRED FORTE) 1 % ophthalmic suspension 1 drop 4 (four) times daily.    . ranitidine (ZANTAC) 150 MG tablet Take 1 tablet (150 mg total) by mouth 2 (two) times daily. 180 tablet 3  . sucralfate (  CARAFATE) 1 GM/10ML suspension TAKE  10 ML BY MOUTH 4 TIMES A DAY, 1 HR BEFORE MEALS AND AT BEDTIME. 1260 mL 5  . HYDROcodone-acetaminophen (NORCO/VICODIN) 5-325 MG tablet Take 1 tablet by mouth every 12 (twelve) hours. 15 tablet 0  . losartan (COZAAR) 50 MG tablet Take 1 tablet (50 mg total) by mouth daily. 90 tablet 1  . metoprolol succinate (TOPROL-XL) 25 MG 24 hr tablet Take 25 mg by mouth daily.     . montelukast (SINGULAIR) 10 MG tablet Take 1 tablet (10 mg total) by mouth at bedtime. 30 tablet 3  . VOLTAREN 1 % GEL apply 2 grams to THE LEFT KNEE three times a day 100 g 2    Facility-Administered Medications Prior to Visit  Medication Dose Route Frequency Provider Last Rate Last Dose  . 0.9 %  sodium chloride infusion   Intravenous Continuous [name], MD   Stopped at 11/30/13 1216    PAST MEDICAL HISTORY: Past Medical History  Diagnosis Date  . Hypertension   . GERD (gastroesophageal reflux disease)   . Asthma   . Multiple sclerosis ([place])   . Hyperlipidemia   . Vertigo   . Vitamin D deficiency   . Obesity   . Achilles tendonitis   . OA (osteoarthritis) of knee   . Anemia, iron deficiency 07/23/2012  . 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 Cancer ([place]) 05/2012    plasma cell myeloma  . S/P radiation therapy 08/12/12 - 09/21/12    left Anterior 2nd Rib Lesion / 50.4 GY / 28 Fractions    PAST SURGICAL HISTORY: Past Surgical History  Procedure Laterality Date  . Cholecystectomy      lap choley  . Abdominal hysterectomy      fibroids  . Breast surgery      left breast biopsy-benign  . Rotator cuff repair    . Bone biopsy  05/17/12    Left 2nd Rib, Plasma Cell Myeloma    FAMILY HISTORY: Family History  Problem Relation Age of Onset  . Dementia Mother   . Hypertension Mother   . Cancer Father     lung; +tobacco  . Diabetes Sister   . Cancer Brother   . Diabetes Sister     SOCIAL HISTORY: Social History   Social History  . Marital Status: Widowed    Spouse Name: N/A  . Number of Children: 1  . Years of Education: 12   Occupational History  .    Marland Kitchen Retired     Social History Main Topics  . Smoking status: Former Smoker -- 0.10 packs/day for 2 years    Types: Cigarettes    Start date: 10/05/1976    Quit date: 04/19/1979  . Smokeless tobacco: Never Used     Comment: quit 35 years ago   . Alcohol Use: No  . Drug Use: No  . Sexual Activity: No   Other Topics Concern  . 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    Patient is retired.     Patient is widowed.    Patient is right handed.    Patient has a high school education.    Patient has 1 child.       PHYSICAL EXAM  Filed Vitals:   02/13/16 0851  BP: 130/67  Pulse: 77  Height: 5' 1" (1.549 m)  Weight: 214  lb 8 oz (97.297 kg)   Body mass index is 40.55 kg/(m^2).  PHYSICAL EXAMNIATION:  Gen: NAD, conversant, well nourised, obese, well groomed                     Cardiovascular: Regular rate rhythm, no peripheral edema, warm, nontender. Eyes: Conjunctivae clear without exudates or hemorrhage Neck: Supple, no carotid bruise. Pulmonary: Clear to auscultation bilaterally   NEUROLOGICAL EXAM:  MENTAL STATUS: Speech:    Speech is normal; fluent and spontaneous with normal comprehension.  Cognition:     Orientation to time, place and person     Normal recent and remote memory     Normal Attention span and concentration     Normal Language, naming, repeating,spontaneous speech     Fund of knowledge   CRANIAL NERVES: CN II: Visual fields are full to confrontation. Fundoscopic exam is normal with sharp discs and no vascular changes. Pupils are round equal and briskly reactive to light. CN III, IV, VI: extraocular movement are normal. No ptosis. CN V: Facial sensation is intact to pinprick in all 3 divisions bilaterally. Corneal responses are intact.  CN VII: Face is symmetric with normal eye closure and smile. CN VIII: Hearing is normal to rubbing fingers CN IX, X: Palate elevates symmetrically. Phonation is normal. CN XI: Head turning and shoulder shrug are intact CN XII: Tongue is midline with normal movements and no atrophy.  MOTOR: There is no pronator drift of out-stretched arms. Muscle bulk and tone are normal. Muscle strength is normal.  REFLEXES: Reflexes are 2+ and symmetric at the biceps, triceps, knees, and ankles. Plantar responses are flexor.  SENSORY: Intact to light touch, pinprick, positional and vibratory sensation are intact in  fingers and toes.  COORDINATION: Rapid alternating movements and fine finger movements are intact. There is no dysmetria on finger-to-nose and heel-knee-shin.    GAIT/STANCE: Posture is normal. Gait is steady with normal steps, base, arm swing, and turning. Heel and toe walking are normal. Tandem gait is normal.  Romberg is absent.  DIAGNOSTIC DATA (LABS, IMAGING, TESTING) - I reviewed patient records, labs, notes, testing and imaging myself where available.   ASSESSMENT AND PLAN  65 y.o. year old Relapsing remitting multiple sclerosis  Clinically stable, has been treated with Betaseron since diagnosis in 2007  We will repeat imaging study  Marcial Pacas, M.D. Ph.D.  [facility] [place], [place] [address] Phone: [phone] Fax:      [phone]

## 2016-02-19 ENCOUNTER — Other Ambulatory Visit: Payer: Self-pay | Admitting: Emergency Medicine

## 2016-03-03 ENCOUNTER — Ambulatory Visit
Admission: RE | Admit: 2016-03-03 | Discharge: 2016-03-03 | Disposition: A | Discharge status: Home / Self Care | Payer: No Typology Code available for payment source | Source: Ambulatory Visit | Attending: Neurology | Admitting: Neurology

## 2016-03-03 DIAGNOSIS — G35 Multiple sclerosis: Secondary | ICD-10-CM

## 2016-03-03 MED ORDER — GADOBENATE DIMEGLUMINE 529 MG/ML IV SOLN
20.0000 mL | Freq: Once | INTRAVENOUS | Status: AC | PRN
  Administered 2016-03-03: 20 mL via INTRAVENOUS

## 2016-03-04 NOTE — Progress Notes (Signed)
Please call patient regarding the recent brain MRI: The brain scan showed a normal structure of the brain and no volume loss which we call atrophy. There were changes in the deeper structures of the brain, which we call white matter changes or microvascular changes. These were reported as moderate in Her case. These are tiny white spots, that occur with time and are seen in a variety of conditions, including with normal aging, chronic hypertension, chronic headaches, especially migraine HAs, chronic diabetes, chronic hyperlipidemia. Findings are in keeping with her Dx of MS, but no acute lesions were seen thankfully. Tiny areas for hemorrhage were seen, called microhemorhages, which can occur with aging, and with or without trauma. Depends on the risk factors and Dr. Krista Blue may do follow up scan or ask for further w/u.  Again, there were no acute findings, such as a stroke, or mass or significant blood products. No further action is required on this test at this time, other than re-enforcing the importance of good blood pressure control, good cholesterol control, good blood sugar control, and weight management. Please remind patient to keep any upcoming appointments or tests and to call us with any interim questions, concerns, problems or updates. Thanks,  Star Age, MD, PhD

## 2016-03-29 ENCOUNTER — Ambulatory Visit (INDEPENDENT_AMBULATORY_CARE_PROVIDER_SITE_OTHER): Payer: Medicare Other | Admitting: Family Medicine

## 2016-03-29 VITALS — BP 140/60 | HR 98 | Temp 98.5°F | Resp 16 | Ht 61.0 in | Wt 213.0 lb

## 2016-03-29 DIAGNOSIS — J040 Acute laryngitis: Secondary | ICD-10-CM | POA: Diagnosis not present

## 2016-03-29 MED ORDER — CHLORHEXIDINE GLUCONATE 0.12 % MT SOLN: 15.0000 mL | Freq: Two times a day (BID) | OROMUCOSAL | 0 refills | Status: DC

## 2016-03-29 NOTE — Progress Notes (Signed)
This is 65 year old woman who has intermittent hoarseness over the last year. Last week, the hoarseness returned and is been worse area has not cleared up.  Patient denies fever, sore throat, chest pain, cough. She has no myalgias or other signs of upper respiratory infection. She notes no change in symptoms of reflux (they seemed to be controlled). She is not been using her voice and any different manner recently.  Patient notes that in association with the hoarseness, she has a blowing sensation in her ears.  She has also been treated for plasmacytoma.  Objective: No acute distress BP 140/60   Pulse 98   Temp 98.5 F (36.9 C) (Oral)   Resp 16   Ht 5\' 1"  (1.549 m)   Wt 213 lb (96.6 kg)   SpO2 95%   BMI 40.25 kg/m  HEENT: Unremarkable. Examination throat reveals no erythema or swelling. TMs appear normal. Tongue and dentition are unremarkable Neck: Supple no adenopathy. Voice is hoarse. There is no thyromegaly. Chest: Clear to auscultation Heart: Regular no murmur Skin: No rash in the head and neck area  Assessment: Recurrent laryngitis of unexplained etiology. Because it has resolved in the past, I think is reasonable to treat symptomatically at this point. I have instructed the patient to return if she is not getting better in 3-4 days so that further evaluation can be undertaken.  Plan: Chlorhexidine gluconate twice a day for 3-4 days, if no better, return  Signed, Carola Frost.D.

## 2016-03-29 NOTE — Patient Instructions (Addendum)
Please return if you're not improving in the next 3-4 days. He has a gargles twice a day.    IF you received an x-ray today, you will receive an invoice from [facility]. Please contact [facility] at [phone] with questions or concerns regarding your invoice.   IF you received labwork today, you will receive an invoice from [facility]. Please contact [facility] at [phone] with questions or concerns regarding your invoice.   Our billing staff will not be able to assist you with questions regarding bills from these companies.  You will be contacted with the lab results as soon as they are available. The fastest way to get your results is to activate your My Chart account. Instructions are located on the last page of this paperwork. If you have not heard from [place] regarding the results in 2 weeks, please contact this office.

## 2016-04-21 ENCOUNTER — Telehealth: Payer: Self-pay

## 2016-04-21 NOTE — Telephone Encounter (Signed)
I spoke to pt. She confirmed that she plans on coming to her 9/12 appt with Dr. Krista Blue. There were concerns about the weather but pt plans on being here.

## 2016-04-22 ENCOUNTER — Ambulatory Visit (INDEPENDENT_AMBULATORY_CARE_PROVIDER_SITE_OTHER): Payer: Medicare Other | Admitting: Neurology

## 2016-04-22 ENCOUNTER — Encounter: Payer: Self-pay | Admitting: Neurology

## 2016-04-22 VITALS — BP 136/76 | HR 87 | Ht 61.0 in | Wt 208.2 lb

## 2016-04-22 DIAGNOSIS — E538 Deficiency of other specified B group vitamins: Secondary | ICD-10-CM

## 2016-04-22 DIAGNOSIS — C9001 Multiple myeloma in remission: Secondary | ICD-10-CM | POA: Diagnosis not present

## 2016-04-22 DIAGNOSIS — I10 Essential (primary) hypertension: Secondary | ICD-10-CM

## 2016-04-22 NOTE — Progress Notes (Signed)
Chief Complaint  Patient presents with  . Multiple Sclerosis    No new concerns today.  She is doing well on Betaseron.      PATIENT: Morgan Espinoza DOB: 08/20/1950   HISTORY OF PRESENT ILLNESS: Ms. Morgan Espinoza is a 65 years old right-handed female, follow-up for multiple sclerosis, her primary care physician is Dr. 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 She denies difficulty walking, strokelike symptoms, she denies paresthesia, no gait difficulty, no bowel bladder incontinence.  She has been treated with Betaseron injection since the diagnosis, she tolerates it well, no significant side effect, she has not had flareup for many years.   She was a patient of Dr. Erling Cruz.  She also takes medicine for hypertension, hyperlipidemia, iron supplements, stomach problems,  I reviewed laboratory evaluations in 2017 there was no significant abnormality in CMP, CBC, ferritin level, TSH   UPDATE Apr 22 2016: She retired from school system, no new symptoms from [employer],  She also has a diagnosis of plasma cells myeloma, I have personally reviewed CT chest in September 2013, chest x-ray in June 2013, she has stable second rib expansile lytic lesion. She is receiving monthly treatment at [facility].  She apparently has some memory trouble, could not elaborate on the details,  she lives by herself, she denies significant difficulty driving, just retired as a Teacher, early years/pre for [employer].  I reviewed her previous oncologist note from Dr.Ennever, she was receiving zometa monthly and iv iron infusion as needed.  We have personally reviewed MRI of the brain with and without contrast on March 03 2016, Tammi Klippel T-2/flair hyperdensity  paraventricular subcortical white matters to her relations at both hemisphere and in the pons, there are many microhemorrhage within the subcortical and deep white matter of the left frontal and temporal lobes, there was no acute findings, she denied a family history of similar disease. Her mother has dementia in her elderly age, died at age 34.  She has one daughter lives in [place]   Reviewed laboratory since 2017, ferritin 883 iron 58, TIBC 242,normal CMP,LDL 70,  cholesterol 188, hepatitis C antibody negative,normal CBC, hemoglobin 11 point 8, igG   Kappa free light chain was elevated 25.4  Normal B12 520 in January 2016,low vitamin D level 20  in July 2014, normal TSH 0.77  REVIEW OF SYSTEMS: Out of a complete 14 system review of symptoms, the patient complains only of the following symptoms, and all other symptoms are negative, memory loss, fatigue,  ALLERGIES: Allergies  Allergen Reactions  . Celebrex [Celecoxib]   . Dristan Other (See Comments)    "made my chest tight"  . Robitussin (Alcohol Free) [Guaifenesin] Other (See Comments)    "made my chest tight"  . Ropinirole Hcl Other (See Comments)    "made my chest tight"  . Tramadol Other (See Comments)    "made my chest tight"    HOME MEDICATIONS: Outpatient Medications Prior to Visit  Medication Sig Dispense Refill  . acetaminophen (TYLENOL) 325 MG tablet Take 650 mg by mouth every 6 (six) hours as needed.    Marland Kitchen albuterol (PROVENTIL HFA;VENTOLIN HFA) 108 (90 Base) MCG/ACT inhaler Inhale 2 puffs into the lungs every 6 (six)  hours as needed (cough, shortness of breath or wheezing.). 1 Inhaler 5  . amLODipine (NORVASC) 10 MG tablet Take 1 tablet (10 mg total) by mouth daily. 90 tablet 3  . atorvastatin (LIPITOR) 10 MG tablet Take 1 tablet (10 mg total) by mouth daily. 90 tablet 3  . chlorhexidine (PERIDEX) 0.12 % solution Use as directed 15 mLs in the mouth or throat 2 (two) times daily. 120 mL 0  . docusate sodium (COLACE) 100 MG  capsule Take 100 mg by mouth as needed for constipation. Reported on 01/10/2016    . ferrous sulfate 325 (65 FE) MG tablet TAKE ONE TABLET BY MOUTH ONCE DAILY WITH BREAKFAST 90 tablet 3  . gabapentin (NEURONTIN) 100 MG capsule Take 1 capsule (100 mg total) by mouth 3 (three) times daily. 90 capsule 3  . HYDROcodone-acetaminophen (NORCO/VICODIN) 5-325 MG tablet Take 1 tablet by mouth every 4 (four) hours as needed for moderate pain.    . Interferon Beta-1b (BETASERON) 0.3 MG KIT injection Inject 0.25 mg into the skin every other day. 1 kit 11  . loratadine (CLARITIN) 10 MG tablet Take 10 mg by mouth daily as needed. Reported on 01/10/2016    . losartan (COZAAR) 50 MG tablet Take 1 tablet (50 mg total) by mouth daily. 90 tablet 1  . omeprazole (PRILOSEC) 20 MG capsule Take 1 capsule (20 mg total) by mouth daily. 90 capsule 3  . prednisoLONE acetate (PRED FORTE) 1 % ophthalmic suspension 1 drop 4 (four) times daily.    . ranitidine (ZANTAC) 150 MG tablet Take 1 tablet (150 mg total) by mouth 2 (two) times daily. 180 tablet 3  . sucralfate (CARAFATE) 1 GM/10ML suspension TAKE  10 ML BY MOUTH 4 TIMES A DAY, 1 HR BEFORE MEALS AND AT BEDTIME. 1260 mL 5  . aspirin 325 MG tablet Take 325 mg by mouth daily.    . ferrous sulfate 325 (65 FE) MG tablet TAKE ONE TABLET BY MOUTH ONCE DAILY WITH BREAKFAST 30 tablet 0   Facility-Administered Medications Prior to Visit  Medication Dose Route Frequency Provider Last Rate Last Dose  . 0.9 %  sodium chloride infusion   Intravenous Continuous [name], MD   Stopped at 11/30/13 1216    PAST MEDICAL HISTORY: Past Medical History:  Diagnosis Date  . Achilles tendonitis   . Anemia, iron deficiency 07/23/2012  . Asthma   . Cancer ([place]) 05/2012   plasma cell myeloma  . GERD (gastroesophageal reflux disease)   . Hyperlipidemia   . Hypertension   . Multiple sclerosis ([place])   . OA (osteoarthritis) of knee   . Obesity   . 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 S/P radiation therapy 08/12/12 - 09/21/12   left Anterior 2nd Rib Lesion / 50.4 GY / 28 Fractions  . Vertigo   . Vitamin D deficiency     PAST SURGICAL HISTORY: Past Surgical History:  Procedure Laterality Date  . ABDOMINAL HYSTERECTOMY     fibroids  . BONE BIOPSY  05/17/12   Left 2nd Rib, Plasma Cell Myeloma  . BREAST SURGERY     left breast biopsy-benign  . CHOLECYSTECTOMY     lap choley  . ROTATOR CUFF REPAIR      FAMILY HISTORY: Family History  Problem Relation Age of Onset  . Dementia Mother   . Hypertension Mother   . Cancer Father     lung; +tobacco  . Diabetes Sister   . Cancer  Brother   . Diabetes Sister     SOCIAL HISTORY: Social History   Social History  . Marital status: Widowed    Spouse name: N/A  . Number of children: 1  . Years of education: 42   Occupational History  .  Retired  . Retired     Social History Main Topics  . Smoking status: Former Smoker    Packs/day: 0.10    Years: 2.00    Types: Cigarettes    Start date: 10/05/1976    Quit date: 04/19/1979  . Smokeless tobacco: Never Used     Comment: quit 35 years ago   . Alcohol use No  . Drug use: No  . Sexual activity: No   Other Topics Concern  . 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    Patient is retired.    Patient is widowed.    Patient is right handed.    Patient has a high school education.    Patient has 1 child.       PHYSICAL EXAM  Vitals:   04/22/16 0744  BP: 136/76  Pulse: 87  Weight: 208 lb 4 oz (94.5 kg)  Height: '5\' 1"'  (1.549 m)   Body mass index is 39.35 kg/m.  PHYSICAL EXAMNIATION:  Gen: NAD, conversant, well nourised, obese, well groomed                     Cardiovascular: Regular rate rhythm, no peripheral edema, warm, nontender. Eyes: Conjunctivae clear without exudates or hemorrhage Neck: Supple, no carotid bruise. Pulmonary: Clear to auscultation bilaterally    NEUROLOGICAL EXAM:  MENTAL STATUS: Speech:    Speech is normal; fluent and spontaneous with normal comprehension.  Cognition:Mini-Mental Status Examination is 21/30     Orientation: She is not oriented to date, clinic name     recent and remote memory: She missed 3/3 recall     Attention span and concentration: She has difficulties spelling world spelling backwards     Normal Language, naming, repeating,spontaneous speech     Fund of knowledge   CRANIAL NERVES: CN II: Visual fields are full to confrontation. Fundoscopic exam is normal with sharp discs and no vascular changes. Pupils are round equal and briskly reactive to light. CN III, IV, VI: extraocular movement are normal. No ptosis. CN V: Facial sensation is intact to pinprick in all 3 divisions bilaterally. Corneal responses are intact.  CN VII: Face is symmetric with normal eye closure and smile. CN VIII: Hearing is normal to rubbing fingers CN IX, X: Palate elevates symmetrically. Phonation is normal. CN XI: Head turning and shoulder shrug are intact CN XII: Tongue is midline with normal movements and no atrophy.  MOTOR: There is no pronator drift of out-stretched arms. Muscle bulk and tone are normal. Muscle strength is normal.  REFLEXES: Reflexes are 2+ and symmetric at the biceps, triceps, knees, and ankles. Plantar responses are flexor.  SENSORY: Intact to light touch, pinprick, positional and vibratory sensation are intact in fingers and toes.  COORDINATION: Rapid alternating movements and fine finger movements are intact. There is no dysmetria on finger-to-nose and heel-knee-shin.    GAIT/STANCE: She needs push up on the chair arm to get up from seated position, mildly unsteady gait, lordotic gait  Romberg is absent.  DIAGNOSTIC DATA (LABS, IMAGING, TESTING) - I reviewed patient records, labs, notes, testing and imaging myself where available.  ASSESSMENT AND PLAN  65 y.o. year old Relapsing remitting  multiple sclerosis  Clinically  stable, has been treated with Betaseron since diagnosis in 2007  Most recent MRI of the brain in July 2017 showed extensive periventricular lesions, the atypical features are supratentorium subcortical microhemorrhage,   Mild cognitive impairment:  Mini-Mental Status Examination 21/30  Plasma cell myeloma  Is receiving treatment at [facility], but she could not elaborate on detail, previously was seen by oncologist Dr. Marin Olp.  Will get medical record.  History of vitamin D deficiency  We will repeat laboratory evaluation at next follow-up visit in 6 months, vitamin D level,TSH, repeat vitamin B12, will also repeat MRI of the brain in one year in July 2018, follow-up with me afterwards,  Marcial Pacas, M.D. Ph.D.  [facility] [place], [place] [address] Phone: [phone] Fax:      [phone]

## 2016-05-02 ENCOUNTER — Encounter: Payer: Self-pay | Admitting: Internal Medicine

## 2016-05-02 ENCOUNTER — Ambulatory Visit (INDEPENDENT_AMBULATORY_CARE_PROVIDER_SITE_OTHER): Payer: Medicare Other | Admitting: Internal Medicine

## 2016-05-02 VITALS — BP 154/88 | HR 79 | Temp 98.1°F | Resp 16 | Ht 61.0 in | Wt 209.0 lb

## 2016-05-02 DIAGNOSIS — C9030 Solitary plasmacytoma not having achieved remission: Secondary | ICD-10-CM

## 2016-05-02 DIAGNOSIS — J4520 Mild intermittent asthma, uncomplicated: Secondary | ICD-10-CM

## 2016-05-02 DIAGNOSIS — K219 Gastro-esophageal reflux disease without esophagitis: Secondary | ICD-10-CM

## 2016-05-02 DIAGNOSIS — G35 Multiple sclerosis: Secondary | ICD-10-CM | POA: Diagnosis not present

## 2016-05-02 DIAGNOSIS — I10 Essential (primary) hypertension: Secondary | ICD-10-CM | POA: Diagnosis not present

## 2016-05-02 DIAGNOSIS — E785 Hyperlipidemia, unspecified: Secondary | ICD-10-CM

## 2016-05-02 DIAGNOSIS — D509 Iron deficiency anemia, unspecified: Secondary | ICD-10-CM

## 2016-05-02 NOTE — Assessment & Plan Note (Signed)
BP Readings from Last 3 Encounters:  05/02/16 (!) 154/88  04/22/16 136/76  03/29/16 140/60   Controlled on average - ? Will continue current medications at current doses for now

## 2016-05-02 NOTE — Assessment & Plan Note (Signed)
Taking iron daily Following with oncology

## 2016-05-02 NOTE — Assessment & Plan Note (Signed)
Lipid panel done in June - well controlled Continue lipitor at current dose

## 2016-05-02 NOTE — Assessment & Plan Note (Signed)
Follows with Dr Krista Blue Taking Interferon Beta- 1b Well controlled

## 2016-05-02 NOTE — Assessment & Plan Note (Signed)
Following with oncology 

## 2016-05-02 NOTE — Patient Instructions (Addendum)
  No immunizations administered today.   Medications reviewed and updated.  No changes recommended at this time.  Your prescription(s) have been submitted to your pharmacy. Please take as directed and contact our office if you believe you are having problem(s) with the medication(s).   Please followup in 6 months   

## 2016-05-02 NOTE — Assessment & Plan Note (Signed)
Uses albuterol only as needed - has not used it in a while Continue albuterol as needed

## 2016-05-02 NOTE — Progress Notes (Signed)
Subjective:    Patient ID: Morgan Espinoza, female    DOB: [birth year], 65 y.o.   MRN: [id number]  HPI She is here to establish with a new pcp.    Multiple sclerosis: She is following with neurology. She is currently on interferon.  Hypertension: She is taking her medication daily. She is compliant with a low sodium diet.  She denies chest pain, palpitations, edema, shortness of breath and regular headaches. She is not exercising regularly.  She does not monitor her blood pressure at home.    Hyperlipidemia: She is taking her medication daily. She is compliant with a low fat/cholesterol diet. She is notexercising regularly. She denies myalgias.   GERD:  She is taking her medication daily as prescribed.  She denies any GERD symptoms and feels her GERD is well controlled.   Asthma: She uses the albuterol only as needed and has not used it in a while.  She feels her asthma is well controlled.    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  She is currently in remission.   Medications and allergies reviewed with patient and updated if appropriate.  Patient Active Problem List   Diagnosis Date Noted  . Primary osteoarthritis of both knees 10/12/2014  . B12 deficiency 10/12/2014  . Plasmacytoma ([place]) 07/27/2012  . Anemia   . Hyperlipidemia   . Vertigo   . Vitamin D deficiency   . Obesity   . Achilles tendonitis   . Anemia, iron deficiency 07/23/2012  . 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 Rib lesion 04/28/2012  . Chest pain 06/05/2011  . Hypertension 06/05/2011  . Asthma 06/05/2011  . GERD (gastroesophageal reflux disease) 06/05/2011  . MS (multiple sclerosis) ([place]) 06/05/2011    Current Outpatient Prescriptions on File Prior to Visit  Medication Sig Dispense Refill  . acetaminophen (TYLENOL) 325 MG tablet Take 650 mg by mouth every 6 (six) hours as needed.    Marland Kitchen albuterol (PROVENTIL HFA;VENTOLIN HFA) 108 (90 Base) MCG/ACT inhaler Inhale 2 puffs into the lungs every 6 (six)  hours as needed (cough, shortness of breath or wheezing.). 1 Inhaler 5  . amLODipine (NORVASC) 10 MG tablet Take 1 tablet (10 mg total) by mouth daily. 90 tablet 3  . aspirin 81 MG tablet Take 81 mg by mouth daily.    Marland Kitchen atorvastatin (LIPITOR) 10 MG tablet Take 1 tablet (10 mg total) by mouth daily. 90 tablet 3  . chlorhexidine (PERIDEX) 0.12 % solution Use as directed 15 mLs in the mouth or throat 2 (two) times daily. 120 mL 0  . docusate sodium (COLACE) 100 MG capsule Take 100 mg by mouth as needed for constipation. Reported on 01/10/2016    . ferrous sulfate 325 (65 FE) MG tablet TAKE ONE TABLET BY MOUTH ONCE DAILY WITH BREAKFAST 90 tablet 3  . HYDROcodone-acetaminophen (NORCO/VICODIN) 5-325 MG tablet Take 1 tablet by mouth every 4 (four) hours as needed for moderate pain.    . Interferon Beta-1b (BETASERON) 0.3 MG KIT injection Inject 0.25 mg into the skin every other day. 1 kit 11  . loratadine (CLARITIN) 10 MG tablet Take 10 mg by mouth daily as needed. Reported on 01/10/2016    . losartan (COZAAR) 50 MG tablet Take 1 tablet (50 mg total) by mouth daily. 90 tablet 1  . omeprazole (PRILOSEC) 20 MG capsule Take 1 capsule (20 mg total) by mouth daily. 90 capsule 3  . prednisoLONE acetate (PRED FORTE) 1 % ophthalmic suspension 1 drop 4 (four)  times daily.    . ranitidine (ZANTAC) 150 MG tablet Take 1 tablet (150 mg total) by mouth 2 (two) times daily. 180 tablet 3  . sucralfate (CARAFATE) 1 GM/10ML suspension TAKE  10 ML BY MOUTH 4 TIMES A DAY, 1 HR BEFORE MEALS AND AT BEDTIME. 1260 mL 5   Current Facility-Administered Medications on File Prior to Visit  Medication Dose Route Frequency Provider Last Rate Last Dose  . 0.9 %  sodium chloride infusion   Intravenous Continuous [name], MD   Stopped at 11/30/13 1216    Past Medical History:  Diagnosis Date  . Achilles tendonitis   . Anemia, iron deficiency 07/23/2012  . Asthma   . Cancer ([place]) 05/2012   plasma cell myeloma  . GERD  (gastroesophageal reflux disease)   . Hyperlipidemia   . Hypertension   . Multiple sclerosis ([place])   . OA (osteoarthritis) of knee   . Obesity   . 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 S/P radiation therapy 08/12/12 - 09/21/12   left Anterior 2nd Rib Lesion / 50.4 GY / 28 Fractions  . Vertigo   . Vitamin D deficiency     Past Surgical History:  Procedure Laterality Date  . ABDOMINAL HYSTERECTOMY     fibroids  . BONE BIOPSY  05/17/12   Left 2nd Rib, Plasma Cell Myeloma  . BREAST SURGERY     left breast biopsy-benign  . CHOLECYSTECTOMY     lap choley  . ROTATOR CUFF REPAIR      Social History   Social History  . Marital status: Widowed    Spouse name: N/A  . Number of children: 1  . Years of education: 68   Occupational History  .  Retired  . Retired     Social History Main Topics  . Smoking status: Former Smoker    Packs/day: 0.10    Years: 2.00    Types: Cigarettes    Start date: 10/05/1976    Quit date: 04/19/1979  . Smokeless tobacco: Never Used     Comment: quit 35 years ago   . Alcohol use No  . Drug use: No  . Sexual activity: No   Other Topics Concern  . None   Social History Narrative   Retired: Bus Driver for 28 years   Patient lives at home alone.    Patient is retired.    Patient is widowed.    Patient is right handed.    Patient has a high school education.    Patient has 1 child.     Family History  Problem Relation Age of Onset  . Dementia Mother   . Hypertension Mother   . Cancer Father     lung; +tobacco  . Diabetes Sister   . Cancer Brother   . Diabetes Sister     Review of Systems  Constitutional: Negative for appetite change, chills, fatigue and fever.  Eyes: Negative for visual disturbance.  Respiratory: Negative for cough, shortness of breath and wheezing.   Cardiovascular: Positive for palpitations (occasional). Negative for chest pain and leg swelling.  Gastrointestinal: Negative for abdominal pain, blood in  stool, constipation, diarrhea and nausea.  Musculoskeletal: Positive for arthralgias (occasional).  Neurological: Negative for dizziness, weakness, light-headedness, numbness and headaches.  Psychiatric/Behavioral: Negative for dysphoric mood. The patient is not nervous/anxious.        Objective:   Vitals:   05/02/16 0950  BP: (!) 154/88  Pulse: 79  Resp: 16  Temp: 98.1 F (36.7 C)   Filed Weights   05/02/16 0950  Weight: 209 lb (94.8 kg)   Body mass index is 39.49 kg/m.   Physical Exam Constitutional: She appears well-developed and well-nourished. No distress.  HENT:  Head: Normocephalic and atraumatic.  Right Ear: External ear normal. Normal ear canal and TM Left Ear: External ear normal.  Normal ear canal and TM Mouth/Throat: Oropharynx is clear and moist.  Eyes: Conjunctivae and EOM are normal.  Neck: Neck supple. No tracheal deviation present. No thyromegaly present.  No carotid bruit  Cardiovascular: Normal rate, regular rhythm and normal heart sounds.   No murmur heard.  No edema. Pulmonary/Chest: Effort normal and breath sounds normal. No respiratory distress. She has no wheezes. She has no rales.  Abdominal: Soft. Obese.  She exhibits no distension. There is no tenderness.  Lymphadenopathy: She has no cervical adenopathy.  Skin: Skin is warm and dry. She is not diaphoretic.  Psychiatric: She has a normal mood and affect. Her behavior is normal.        Assessment & Plan:   See Problem List for Assessment and Plan of chronic medical problems.   F/u in 6 months

## 2016-05-02 NOTE — Progress Notes (Signed)
Pre visit review using our clinic review tool, if applicable. No additional management support is needed unless otherwise documented below in the visit note. 

## 2016-05-02 NOTE — Assessment & Plan Note (Addendum)
GERD controlled Continue daily omeprazole daily and zantac twice daily Hold carafate for now

## 2016-05-05 ENCOUNTER — Encounter: Payer: Self-pay | Admitting: Family

## 2016-05-05 ENCOUNTER — Other Ambulatory Visit (HOSPITAL_BASED_OUTPATIENT_CLINIC_OR_DEPARTMENT_OTHER): Payer: Medicare Other

## 2016-05-05 ENCOUNTER — Ambulatory Visit (HOSPITAL_BASED_OUTPATIENT_CLINIC_OR_DEPARTMENT_OTHER): Payer: Medicare Other

## 2016-05-05 ENCOUNTER — Ambulatory Visit (HOSPITAL_BASED_OUTPATIENT_CLINIC_OR_DEPARTMENT_OTHER): Payer: Medicare Other | Admitting: Family

## 2016-05-05 VITALS — BP 139/74 | HR 63 | Temp 98.0°F | Resp 16 | Ht 61.0 in | Wt 208.1 lb

## 2016-05-05 DIAGNOSIS — C9030 Solitary plasmacytoma not having achieved remission: Secondary | ICD-10-CM | POA: Diagnosis not present

## 2016-05-05 DIAGNOSIS — C9000 Multiple myeloma not having achieved remission: Secondary | ICD-10-CM

## 2016-05-05 DIAGNOSIS — C9001 Multiple myeloma in remission: Secondary | ICD-10-CM

## 2016-05-05 DIAGNOSIS — D509 Iron deficiency anemia, unspecified: Secondary | ICD-10-CM

## 2016-05-05 LAB — CBC WITH DIFFERENTIAL (CANCER CENTER ONLY)
BASO#: 0 10*3/uL (ref 0.0–0.2)
BASO%: 0.3 % (ref 0.0–2.0)
EOS ABS: 1.1 10*3/uL — AB (ref 0.0–0.5)
EOS%: 17.6 % — ABNORMAL HIGH (ref 0.0–7.0)
HEMATOCRIT: 35.7 % (ref 34.8–46.6)
HEMOGLOBIN: 11.8 g/dL (ref 11.6–15.9)
LYMPH#: 1.7 10*3/uL (ref 0.9–3.3)
LYMPH%: 27.3 % (ref 14.0–48.0)
MCH: 31.4 pg (ref 26.0–34.0)
MCHC: 33.1 g/dL (ref 32.0–36.0)
MCV: 95 fL (ref 81–101)
MONO#: 0.6 10*3/uL (ref 0.1–0.9)
MONO%: 8.6 % (ref 0.0–13.0)
NEUT#: 2.9 10*3/uL (ref 1.5–6.5)
NEUT%: 46.2 % (ref 39.6–80.0)
Platelets: 157 10*3/uL (ref 145–400)
RBC: 3.76 10*6/uL (ref 3.70–5.32)
RDW: 12.3 % (ref 11.1–15.7)
WBC: 6.4 10*3/uL (ref 3.9–10.0)

## 2016-05-05 LAB — CMP (CANCER CENTER ONLY)
ALK PHOS: 78 U/L (ref 26–84)
ALT: 36 U/L (ref 10–47)
AST: 42 U/L — ABNORMAL HIGH (ref 11–38)
Albumin: 3.3 g/dL (ref 3.3–5.5)
BUN: 19 mg/dL (ref 7–22)
CHLORIDE: 109 meq/L — AB (ref 98–108)
CO2: 28 mEq/L (ref 18–33)
CREATININE: 0.9 mg/dL (ref 0.6–1.2)
Calcium: 8.9 mg/dL (ref 8.0–10.3)
Glucose, Bld: 87 mg/dL (ref 73–118)
Potassium: 3.7 mEq/L (ref 3.3–4.7)
Sodium: 140 mEq/L (ref 128–145)
TOTAL PROTEIN: 7.2 g/dL (ref 6.4–8.1)
Total Bilirubin: 0.7 mg/dl (ref 0.20–1.60)

## 2016-05-05 MED ORDER — SODIUM CHLORIDE 0.9 % IV SOLN
Freq: Once | INTRAVENOUS | Status: AC
  Administered 2016-05-05: 11:00:00 via INTRAVENOUS

## 2016-05-05 MED ORDER — ZOLEDRONIC ACID 4 MG/100ML IV SOLN
4.0000 mg | Freq: Once | INTRAVENOUS | Status: AC
  Administered 2016-05-05: 4 mg via INTRAVENOUS
  Filled 2016-05-05: qty 100

## 2016-05-05 NOTE — Progress Notes (Signed)
Hematology and Oncology Follow Up Visit  Morgan Espinoza [id number] 1950/10/23 65 y.o. 05/05/2016   Principle Diagnosis:  IgG Kappa plasmacytoma Iron deficiency anemia  Current Therapy:   Zometa 4 mg IV q. 6 months IV iron as indicated - last received Feraheme in June 2015 with a dose of 1020 mg    Interim History: Morgan Espinoza is here today for a follow-up. She continues to do quite well and so far, her myeloma has not been an issue. In March, her M-spike was not detected. IgG level was 1,169 mg/dl and kappa light chain was 2.54 mg/dL. These labs were rechecked today and results are pending.  No lymphadenopathy found on exam. She has had no episodes of bleeding or bruising.  She has had no issue with frequent infections. No c/o fatigue, fever, chills, n/v, cough, rash, dizziness, headaches, chest pain, palpitations, abdominal pain or changes in bowel or bladder habits.  She has occasional SOB due to asthma if she over exerts herself. This resolves with taking a moment to rest and she also has an inhaler to use as needed.  No swelling, tenderness, numbness or tingling in her extremities. No c/o joint aches or pains.  She has a good appetite and is staying well hydrated. Her weight is stable.   Medications:    Medication List       Accurate as of 05/05/16 10:37 AM. Always use your most recent med list.          acetaminophen 325 MG tablet Commonly known as:  TYLENOL Take 650 mg by mouth every 6 (six) hours as needed.   albuterol 108 (90 Base) MCG/ACT inhaler Commonly known as:  PROVENTIL HFA;VENTOLIN HFA Inhale 2 puffs into the lungs every 6 (six) hours as needed (cough, shortness of breath or wheezing.).   amLODipine 10 MG tablet Commonly known as:  NORVASC Take 1 tablet (10 mg total) by mouth daily.   aspirin 81 MG tablet Take 81 mg by mouth daily.   atorvastatin 10 MG tablet Commonly known as:  LIPITOR Take 1 tablet (10 mg total) by mouth daily.   chlorhexidine 0.12 %  solution Commonly known as:  PERIDEX Use as directed 15 mLs in the mouth or throat 2 (two) times daily.   docusate sodium 100 MG capsule Commonly known as:  COLACE Take 100 mg by mouth as needed for constipation. Reported on 01/10/2016   ferrous sulfate 325 (65 FE) MG tablet TAKE ONE TABLET BY MOUTH ONCE DAILY WITH BREAKFAST   HYDROcodone-acetaminophen 5-325 MG tablet Commonly known as:  NORCO/VICODIN Take 1 tablet by mouth every 4 (four) hours as needed for moderate pain.   Interferon Beta-1b 0.3 MG Kit injection Commonly known as:  BETASERON Inject 0.25 mg into the skin every other day.   loratadine 10 MG tablet Commonly known as:  CLARITIN Take 10 mg by mouth daily as needed. Reported on 01/10/2016   losartan 50 MG tablet Commonly known as:  COZAAR Take 1 tablet (50 mg total) by mouth daily.   omeprazole 20 MG capsule Commonly known as:  PRILOSEC Take 1 capsule (20 mg total) by mouth daily.   prednisoLONE acetate 1 % ophthalmic suspension Commonly known as:  PRED FORTE 1 drop 4 (four) times daily.   ranitidine 150 MG tablet Commonly known as:  ZANTAC Take 1 tablet (150 mg total) by mouth 2 (two) times daily.   sucralfate 1 GM/10ML suspension Commonly known as:  CARAFATE TAKE  10 ML BY MOUTH 4 TIMES A  DAY, 1 HR BEFORE MEALS AND AT BEDTIME.       Allergies:  Allergies  Allergen Reactions  . Celebrex [Celecoxib]   . Dristan Other (See Comments)    "made my chest tight"  . Robitussin (Alcohol Free) [Guaifenesin] Other (See Comments)    "made my chest tight"  . Ropinirole Hcl Other (See Comments)    "made my chest tight"  . Tramadol Other (See Comments)    "made my chest tight"    Past Medical History, Surgical history, Social history, and Family History were reviewed and updated.  Review of Systems: All other 10 point review of systems is negative.   Physical Exam:  vitals were not taken for this visit.  Wt Readings from Last 3 Encounters:  05/02/16 209  lb (94.8 kg)  04/22/16 208 lb 4 oz (94.5 kg)  03/29/16 213 lb (96.6 kg)    Ocular: Sclerae unicteric, pupils equal, round and reactive to light Ear-nose-throat: Oropharynx clear, dentition fair Lymphatic: No cervical supraclavicular or axillary adenopathy Lungs no rales or rhonchi, good excursion bilaterally Heart regular rate and rhythm, no murmur appreciated Abd soft, nontender, positive bowel sounds, no liver or spleen tip palpated during exam.  MSK no focal spinal tenderness, no joint edema Neuro: non-focal, well-oriented, appropriate affect Breasts: Deferred  Lab Results  Component Value Date   WBC 6.4 05/05/2016   HGB 11.8 05/05/2016   HCT 35.7 05/05/2016   MCV 95 05/05/2016   PLT 157 05/05/2016   Lab Results  Component Value Date   FERRITIN 883 (H) 01/10/2016   IRON 58 01/10/2016   TIBC 242 (L) 01/10/2016   UIBC 184 01/10/2016   IRONPCTSAT 24 01/10/2016   Lab Results  Component Value Date   RETICCTPCT 0.9 10/05/2013   RBC 3.76 05/05/2016   RETICCTABS 36.5 10/05/2013   Lab Results  Component Value Date   KPAFRELGTCHN 3.18 (H) 08/02/2015   LAMBDASER 2.24 08/02/2015   KAPLAMBRATIO 1.12 11/01/2015   Lab Results  Component Value Date   IGGSERUM 1,169 11/01/2015   IGA 204 08/02/2015   IGMSERUM 66 11/01/2015   Lab Results  Component Value Date   TOTALPROTELP 6.8 08/02/2015   ALBUMINELP 3.7 (L) 08/02/2015   A1GS 0.4 (H) 08/02/2015   A2GS 0.8 08/02/2015   BETS 0.4 08/02/2015   BETA2SER 0.4 08/02/2015   GAMS 1.2 08/02/2015   MSPIKE Not Observed 11/01/2015   SPEI * 08/02/2015     Chemistry      Component Value Date/Time   NA 143 01/10/2016 1148   NA 144 11/01/2015 0927   K 3.4 (L) 01/10/2016 1148   K 3.8 11/01/2015 0927   CL 106 01/10/2016 1148   CL 104 08/02/2015 1005   CO2 25 01/10/2016 1148   CO2 27 11/01/2015 0927   BUN 16 01/10/2016 1148   BUN 15.5 11/01/2015 0927   CREATININE 0.68 01/10/2016 1148   CREATININE 0.8 11/01/2015 0927        Component Value Date/Time   CALCIUM 8.9 01/10/2016 1148   CALCIUM 9.2 11/01/2015 0927   ALKPHOS 71 01/10/2016 1148   ALKPHOS 74 11/01/2015 0927   AST 33 01/10/2016 1148   AST 26 11/01/2015 0927   ALT 26 01/10/2016 1148   ALT 21 11/01/2015 0927   BILITOT 0.4 01/10/2016 1148   BILITOT 0.48 11/01/2015 0927     Impression and Plan: Morgan Espinoza is 65 yo female with a plasmacytoma. She had radiation to the left ribs in February of 2014. She continues  to do well and is asymptomatic at this time. She had no M-spike detected with her labs in March. IgG level was 1,169 mg/dL. We will see what her myeloma studies from today show. No anemia at this time. CBC and CMP are stable.  She will receive Zometa today and continue her every 6 month schedule.  We will see her back in 6 months for labs and follow-up She will contact us with any questions or concerns. We can certainly see her sooner if need be.   Eliezer Bottom, NP 9/25/201710:37 AM

## 2016-05-05 NOTE — Patient Instructions (Signed)

## 2016-05-06 LAB — KAPPA/LAMBDA LIGHT CHAINS
IG KAPPA FREE LIGHT CHAIN: 24.5 mg/L — AB (ref 3.3–19.4)
Ig Lambda Free Light Chain: 20.2 mg/L (ref 5.7–26.3)
KAPPA/LAMBDA FLC RATIO: 1.21 (ref 0.26–1.65)

## 2016-05-07 LAB — MULTIPLE MYELOMA PANEL, SERUM
ALBUMIN SERPL ELPH-MCNC: 3.6 g/dL (ref 2.9–4.4)
Albumin/Glob SerPl: 1.2 (ref 0.7–1.7)
Alpha 1: 0.3 g/dL (ref 0.0–0.4)
Alpha2 Glob SerPl Elph-Mcnc: 0.8 g/dL (ref 0.4–1.0)
B-Globulin SerPl Elph-Mcnc: 1 g/dL (ref 0.7–1.3)
GAMMA GLOB SERPL ELPH-MCNC: 1.2 g/dL (ref 0.4–1.8)
GLOBULIN, TOTAL: 3.2 g/dL (ref 2.2–3.9)
IGA/IMMUNOGLOBULIN A, SERUM: 175 mg/dL (ref 87–352)
IGM (IMMUNOGLOBIN M), SRM: 63 mg/dL (ref 26–217)
IgG, Qn, Serum: 1116 mg/dL (ref 700–1600)
TOTAL PROTEIN: 6.8 g/dL (ref 6.0–8.5)

## 2016-05-27 ENCOUNTER — Other Ambulatory Visit: Payer: Self-pay | Admitting: Emergency Medicine

## 2016-05-31 ENCOUNTER — Other Ambulatory Visit: Payer: Self-pay | Admitting: Emergency Medicine

## 2016-06-03 ENCOUNTER — Other Ambulatory Visit: Payer: Self-pay | Admitting: Family

## 2016-06-03 DIAGNOSIS — M899 Disorder of bone, unspecified: Secondary | ICD-10-CM

## 2016-08-01 ENCOUNTER — Ambulatory Visit (INDEPENDENT_AMBULATORY_CARE_PROVIDER_SITE_OTHER): Payer: Medicare Other | Admitting: Internal Medicine

## 2016-08-01 VITALS — BP 152/74 | HR 88 | Temp 98.1°F | Resp 16 | Wt 209.0 lb

## 2016-08-01 DIAGNOSIS — R195 Other fecal abnormalities: Secondary | ICD-10-CM

## 2016-08-01 DIAGNOSIS — K219 Gastro-esophageal reflux disease without esophagitis: Secondary | ICD-10-CM | POA: Diagnosis not present

## 2016-08-01 DIAGNOSIS — I10 Essential (primary) hypertension: Secondary | ICD-10-CM

## 2016-08-01 NOTE — Progress Notes (Signed)
Subjective:    Patient ID: Morgan Espinoza, female    DOB: 15-Mar-1951, 65 y.o.   MRN: [id number]  HPI She is here for an acute visit.   She is having loose stool.  She takes omeprazole 20 mg every morning,  Waits 30 minutes and has loose stool before she eats breakfast and after she eats.  She denies blood in the stool. She typically has 4-5 loose stools a day.  It started 1-2 months ago.  She has some mild abdominal pain today, but has only had it one other day.  She denies fever or nausea.  There has not been any changes in her diet, supplements or medication.  She was concerned the diarrhea was from the omeprazole.   She takes zantac once daily.  She hs been taking carafate at night.  She denies GERD.    Medications and allergies reviewed with patient and updated if appropriate.  Patient Active Problem List   Diagnosis Date Noted  . Lytic bone lesions on xray 06/03/2016  . Primary osteoarthritis of both knees 10/12/2014  . B12 deficiency 10/12/2014  . Plasmacytoma ([place]) 07/27/2012  . Hyperlipidemia   . Vertigo   . Vitamin D deficiency   . Obesity   . Achilles tendonitis   . Anemia, iron deficiency 07/23/2012  . 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 Rib lesion 04/28/2012  . Hypertension 06/05/2011  . Asthma 06/05/2011  . GERD (gastroesophageal reflux disease) 06/05/2011  . MS (multiple sclerosis) ([place]) 06/05/2011    Current Outpatient Prescriptions on File Prior to Visit  Medication Sig Dispense Refill  . acetaminophen (TYLENOL) 325 MG tablet Take 650 mg by mouth every 6 (six) hours as needed.    Marland Kitchen albuterol (PROVENTIL HFA;VENTOLIN HFA) 108 (90 Base) MCG/ACT inhaler Inhale 2 puffs into the lungs every 6 (six) hours as needed (cough, shortness of breath or wheezing.). 1 Inhaler 5  . amLODipine (NORVASC) 10 MG tablet Take 1 tablet (10 mg total) by mouth daily. 90 tablet 3  . aspirin 81 MG tablet Take 81 mg by mouth daily.    Marland Kitchen atorvastatin (LIPITOR) 10 MG tablet Take 1 tablet  (10 mg total) by mouth daily. 90 tablet 3  . chlorhexidine (PERIDEX) 0.12 % solution Use as directed 15 mLs in the mouth or throat 2 (two) times daily. 120 mL 0  . docusate sodium (COLACE) 100 MG capsule Take 100 mg by mouth as needed for constipation. Reported on 01/10/2016    . ferrous sulfate 325 (65 FE) MG tablet TAKE ONE TABLET BY MOUTH ONCE DAILY WITH BREAKFAST 90 tablet 3  . HYDROcodone-acetaminophen (NORCO/VICODIN) 5-325 MG tablet Take 1 tablet by mouth every 4 (four) hours as needed for moderate pain.    . Interferon Beta-1b (BETASERON) 0.3 MG KIT injection Inject 0.25 mg into the skin every other day. 1 kit 11  . loratadine (CLARITIN) 10 MG tablet Take 10 mg by mouth daily as needed. Reported on 01/10/2016    . losartan (COZAAR) 50 MG tablet Take 1 tablet (50 mg total) by mouth daily. 90 tablet 1  . losartan (COZAAR) 50 MG tablet TAKE ONE TABLET BY MOUTH ONCE DAILY 90 tablet 1  . omeprazole (PRILOSEC) 20 MG capsule Take 1 capsule (20 mg total) by mouth daily. 90 capsule 3  . prednisoLONE acetate (PRED FORTE) 1 % ophthalmic suspension 1 drop 4 (four) times daily.    . ranitidine (ZANTAC) 150 MG tablet Take 1 tablet (150 mg total) by mouth 2 (  two) times daily. 180 tablet 3  . sucralfate (CARAFATE) 1 GM/10ML suspension TAKE TWO TEASPOONSFUL BY MOUTH 4 TIMES DAILY ONE  HOUR  BEFORE  MEALS  AND  AT  BEDTIME 1260 mL 2   Current Facility-Administered Medications on File Prior to Visit  Medication Dose Route Frequency Provider Last Rate Last Dose  . 0.9 %  sodium chloride infusion   Intravenous Continuous [name], MD   Stopped at 11/30/13 1216    Past Medical History:  Diagnosis Date  . Achilles tendonitis   . Anemia, iron deficiency 07/23/2012  . Asthma   . Cancer ([place]) 05/2012   plasma cell myeloma  . GERD (gastroesophageal reflux disease)   . Hyperlipidemia   . Hypertension   . Multiple sclerosis ([place])   . OA (osteoarthritis) of knee   . Obesity   . Plasma cell myeloma ([facility])  05/17/12   left 2nd Rib  . S/P radiation therapy 08/12/12 - 09/21/12   left Anterior 2nd Rib Lesion / 50.4 GY / 28 Fractions  . Vertigo   . Vitamin D deficiency     Past Surgical History:  Procedure Laterality Date  . ABDOMINAL HYSTERECTOMY     fibroids  . BONE BIOPSY  05/17/12   Left 2nd Rib, Plasma Cell Myeloma  . BREAST SURGERY     left breast biopsy-benign  . CHOLECYSTECTOMY     lap choley  . ROTATOR CUFF REPAIR      Social History   Social History  . Marital status: Widowed    Spouse name: N/A  . Number of children: 1  . Years of education: 62   Occupational History  .  Retired  . Retired     Social History Main Topics  . Smoking status: Former Smoker    Packs/day: 0.10    Years: 2.00    Types: Cigarettes    Start date: 10/05/1976    Quit date: 04/19/1979  . Smokeless tobacco: Never Used     Comment: quit 35 years ago   . Alcohol use No  . Drug use: No  . Sexual activity: No   Other Topics Concern  . 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    Patient is retired.    Patient is widowed.    Patient is right handed.    Patient has a high school education.    Patient has 1 child.     Family History  Problem Relation Age of Onset  . Dementia Mother   . Hypertension Mother   . Cancer Father     lung; +tobacco  . Diabetes Sister   . Cancer Brother   . Diabetes Sister     Review of Systems  Constitutional: Negative for appetite change and fever.  HENT: Negative for trouble swallowing.   Respiratory: Negative for cough, shortness of breath and wheezing.   Cardiovascular: Negative for chest pain and palpitations.  Gastrointestinal: Positive for abdominal pain (today only). Negative for blood in stool, diarrhea (loose stool) and nausea.  Neurological: Negative for light-headedness and headaches.       Objective:   Vitals:   08/01/16 1549  BP: (!) 152/74  Pulse: 88  Resp: 16  Temp:  98.1 F (36.7 C)   Filed Weights   08/01/16 1549  Weight: 209 lb (94.8 kg)   Body mass index is 39.49 kg/m.   Physical Exam  Constitutional:  She appears well-developed and well-nourished. No distress.  HENT:  Head: Normocephalic and atraumatic.  Cardiovascular: Normal rate and regular rhythm.   Murmur (1/6 systolic murmur) heard. Pulmonary/Chest: Effort normal. No respiratory distress. She has no wheezes. She has no rales.  Abdominal: Soft. She exhibits no distension. There is no tenderness. There is no rebound and no guarding.  Musculoskeletal: She exhibits no edema.  Skin: Skin is warm and dry. She is not diaphoretic. No erythema.          Assessment & Plan:   See Problem List for Assessment and Plan of chronic medical problems.

## 2016-08-01 NOTE — Progress Notes (Signed)
Pre visit review using our clinic review tool, if applicable. No additional management support is needed unless otherwise documented below in the visit note. 

## 2016-08-01 NOTE — Assessment & Plan Note (Addendum)
?   Omeprazole causing loose stool Stop omeprazole Increase zantac to twice daily.  Stop carafate - has been taking nightly  Check cbc, cmp, tsh Will not test stool since she is not have true diarrhea Will refer to GI  --  Can cancel if symptoms resolve

## 2016-08-01 NOTE — Patient Instructions (Signed)
  Test(s) ordered today. Your results will be released to [facility] (or called to you) after review, usually within 72hours after test completion. If any changes need to be made, you will be notified at that same time.    Medications reviewed and updated.  Changes include: Stop the omeprazole Stop the carafate Increase zantac to twice daily.     A referral was ordered for GI.

## 2016-08-02 ENCOUNTER — Encounter: Payer: Self-pay | Admitting: Internal Medicine

## 2016-08-02 NOTE — Assessment & Plan Note (Addendum)
Elevated here today  BP Readings from Last 3 Encounters:  08/01/16 (!) 152/74  05/05/16 139/74  05/02/16 (!) 154/88   No changes in medication today - will just monitor

## 2016-08-06 ENCOUNTER — Telehealth: Payer: Self-pay

## 2016-08-06 NOTE — Telephone Encounter (Signed)
PATIENT USE TO HAVE DR. DAUB AS HER PCP. NOW DR. Marzetta Board BURNS IS HER PHYSICIAN. DR. BURNS NEEDS TO KNOW THE LAST TIME SHE HAD A COLONOSCOPY DONE? Morgan Espinoza COULD NOT REMEMBER. BEST PHONE [phone] (HOME)  [place]

## 2016-08-12 ENCOUNTER — Encounter: Payer: Self-pay | Admitting: Nurse Practitioner

## 2016-08-12 ENCOUNTER — Telehealth: Payer: Self-pay | Admitting: Internal Medicine

## 2016-08-12 NOTE — Telephone Encounter (Signed)
I believe after looking in the chart that GI called pt to schedule appt. Appt for 1/10 has been cancelled.

## 2016-08-12 NOTE — Telephone Encounter (Signed)
Patient states she got a call from our office stating she needed to schedule an appt with Dr. Quay Burow.  I have scheduled her for 1/10 at 1:45pm.  Can you please look into this to make sure we were suppose to schedule her?  I could not find a note in chart.

## 2016-08-20 ENCOUNTER — Ambulatory Visit: Payer: Medicare Other | Admitting: Internal Medicine

## 2016-08-22 ENCOUNTER — Ambulatory Visit (INDEPENDENT_AMBULATORY_CARE_PROVIDER_SITE_OTHER): Payer: Medicare Other | Admitting: Nurse Practitioner

## 2016-08-22 ENCOUNTER — Other Ambulatory Visit: Payer: Medicare Other

## 2016-08-22 ENCOUNTER — Encounter: Payer: Self-pay | Admitting: Nurse Practitioner

## 2016-08-22 VITALS — BP 134/72 | HR 72 | Ht 60.63 in | Wt 208.0 lb

## 2016-08-22 DIAGNOSIS — R194 Change in bowel habit: Secondary | ICD-10-CM | POA: Diagnosis not present

## 2016-08-22 DIAGNOSIS — R197 Diarrhea, unspecified: Secondary | ICD-10-CM

## 2016-08-22 NOTE — Progress Notes (Signed)
HPI: Patient is 66 year old female, referred to PCP, Dr. Celso Amy, for evaluation of loose stool.  Bowel change started a few weeks ago and is characterized by loose stool alternating with normal solid stools. She never has more than 2-3 loose stools a day. No nocturnal diarrhea. No blood in stool. No abdominal pain. No unusual weight loss. No fevers. Omeprazole stopped late December without much improvement in loose stool . Stool softener stopped . No recent dietary changes . Patient has not added any new medications to her regimen but does take Focus Factor sometimes. She  had a normal colonoscopy with intubation of the terminal ileum done for iron deficiency anemia by Dr. Collene Mares June 2010. Small bowel video study showed one proximal small bowel erosion.   Past Medical History:  Diagnosis Date  . Achilles tendonitis   . Anemia, iron deficiency 07/23/2012  . Asthma   . Cancer ([place]) 05/2012   plasma cell myeloma  . GERD (gastroesophageal reflux disease)   . Hyperlipidemia   . Hypertension   . Multiple sclerosis ([place])   . OA (osteoarthritis) of knee   . Obesity   . 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 S/P radiation therapy 08/12/12 - 09/21/12   left Anterior 2nd Rib Lesion / 50.4 GY / 28 Fractions  . Vertigo   . Vitamin D deficiency     Past Surgical History:  Procedure Laterality Date  . ABDOMINAL HYSTERECTOMY     fibroids  . BONE BIOPSY  05/17/12   Left 2nd Rib, Plasma Cell Myeloma  . BREAST SURGERY     left breast biopsy-benign  . CHOLECYSTECTOMY     lap choley  . ROTATOR CUFF REPAIR     Family History  Problem Relation Age of Onset  . Dementia Mother   . Hypertension Mother   . Lung cancer Father     +tobacco  . Diabetes Sister   . Colon cancer Brother     youngest brother  . Diabetes Sister     sister # 70  . Diabetes Brother   . Stomach cancer Neg Hx   . Pancreatic cancer Neg Hx    Social History  Substance Use Topics  . Smoking  status: Former Smoker    Packs/day: 0.10    Years: 2.00    Types: Cigarettes    Start date: 10/05/1976    Quit date: 04/19/1979  . Smokeless tobacco: Never Used     Comment: quit 35 years ago   . Alcohol use No   Current Outpatient Prescriptions  Medication Sig Dispense Refill  . acetaminophen (TYLENOL) 325 MG tablet Take 650 mg by mouth every 6 (six) hours as needed.    Marland Kitchen albuterol (PROVENTIL HFA;VENTOLIN HFA) 108 (90 Base) MCG/ACT inhaler Inhale 2 puffs into the lungs every 6 (six) hours as needed (cough, shortness of breath or wheezing.). 1 Inhaler 5  . amLODipine (NORVASC) 10 MG tablet Take 1 tablet (10 mg total) by mouth daily. 90 tablet 3  . aspirin 81 MG tablet Take 81 mg by mouth daily.    Marland Kitchen atorvastatin (LIPITOR) 10 MG tablet Take 1 tablet (10 mg total) by mouth daily. 90 tablet 3  . chlorhexidine (PERIDEX) 0.12 % solution Use as directed 15 mLs in the mouth or throat 2 (two) times daily. 120 mL 0  . docusate sodium (COLACE) 100 MG capsule Take 100 mg by mouth as needed for constipation. Reported on 01/10/2016    .  HYDROcodone-acetaminophen (NORCO/VICODIN) 5-325 MG tablet Take 1 tablet by mouth every 4 (four) hours as needed for moderate pain.    . Interferon Beta-1b (BETASERON) 0.3 MG KIT injection Inject 0.25 mg into the skin every other day. 1 kit 11  . loratadine (CLARITIN) 10 MG tablet Take 10 mg by mouth daily as needed. Reported on 01/10/2016    . losartan (COZAAR) 50 MG tablet TAKE ONE TABLET BY MOUTH ONCE DAILY 90 tablet 1  . ranitidine (ZANTAC) 150 MG tablet Take 1 tablet (150 mg total) by mouth 2 (two) times daily. 180 tablet 3   No current facility-administered medications for this visit.    Allergies  Allergen Reactions  . Celebrex [Celecoxib]   . Dristan Other (See Comments)    "made my chest tight"  . Robitussin (Alcohol Free) [Guaifenesin] Other (See Comments)    "made my chest tight"  . Ropinirole Hcl Other (See Comments)    "made my chest tight"  . Tramadol  Other (See Comments)    "made my chest tight"     Review of Systems: Positive for sinus trouble, arthritis, back pain, breast redness, vision changes, cough, fever, headaches, muscle pain and cramps, night sweats, shortness of breath, sore throat and voice changes  All other systems reviewed and negative except where noted in HPI.    Physical Exam: BP 134/72   Pulse 72   Ht 5' 0.63" (1.54 m) Comment: w/o shoes  Wt 208 lb (94.3 kg)   BMI 39.78 kg/m  Constitutional:  Obese black female in no acute distress. Psychiatric: Normal mood and affect. Behavior is normal. HEENT: Normocephalic and atraumatic. Conjunctivae are normal. No scleral icterus. Neck supple.  Cardiovascular: Normal rate, regular rhythm. 1+ BLE edema Pulmonary/chest: Effort normal and breath sounds normal. No wheezing, rales or rhonchi. Abdominal: Soft, nondistended, nontender. Bowel sounds active throughout. There are no masses palpable. No hepatomegaly. Lymphadenopathy: No cervical adenopathy noted. Neurological: Alert and oriented to person place and time. Skin: Skin is warm and dry. No rashes noted.   ASSESSMENT AND PLAN:   29. 66 year old female with bowel changes over the last few weeks. Having loose stool alternating with normal solid stools. Not having loose stool every day and never anymore than twice daily. Patient appears well, abdominal exam unremarkable.  -Doubt infectious etiology but will check stool for lactoferrin and C. Difficile -Imodium one tablet daily as needed for loose bowel movement.  -Hold Focus Factor for now just in case the supplement has any stimulants though I did not see any suspicious ingredients on the bottle -TSH and CBC already ordered by PCP -Follow-up with me in 3-4 weeks, or sooner if needed  2. Hx of anemia evaluated in 2010 by Dr. Collene Mares. Normal colonoscopy with TI intubation. One proximal small bowel erosion on video capsule. Maintained on daily iron. Last hgb normal at 11.8 in  late September.    Tye Savoy, NP  08/22/2016, 11:18 AM  Cc: Binnie Rail, MD

## 2016-08-22 NOTE — Progress Notes (Signed)
I agree with the above note, lan

## 2016-08-22 NOTE — Patient Instructions (Addendum)
Please go to the basement level to our lab for stool study.   Hold Focus Factor, see if this helps with diarrhea symptoms.   Imodium, take 1 tab daily if bowels are loose.   If stools are normal, then do not take it.   We made you an appointnent with Tye Savoy NP for 09-22-2016 at 10:00 am.

## 2016-08-25 ENCOUNTER — Other Ambulatory Visit: Payer: Medicare Other

## 2016-08-25 DIAGNOSIS — R194 Change in bowel habit: Secondary | ICD-10-CM

## 2016-08-25 DIAGNOSIS — R197 Diarrhea, unspecified: Secondary | ICD-10-CM

## 2016-08-26 LAB — CLOSTRIDIUM DIFFICILE BY PCR: CDIFFPCR: NOT DETECTED

## 2016-08-26 LAB — FECAL LACTOFERRIN, QUANT: Lactoferrin: POSITIVE

## 2016-09-01 ENCOUNTER — Other Ambulatory Visit: Payer: Self-pay | Admitting: *Deleted

## 2016-09-01 MED ORDER — INTERFERON BETA-1B 0.3 MG ~~LOC~~ KIT: 0.2500 mg | PACK | SUBCUTANEOUS | 11 refills | Status: DC

## 2016-09-04 ENCOUNTER — Encounter: Payer: Medicare Other | Admitting: Gastroenterology

## 2016-09-22 ENCOUNTER — Encounter: Payer: Self-pay | Admitting: Nurse Practitioner

## 2016-09-22 ENCOUNTER — Other Ambulatory Visit (INDEPENDENT_AMBULATORY_CARE_PROVIDER_SITE_OTHER): Payer: Medicare Other

## 2016-09-22 ENCOUNTER — Ambulatory Visit (INDEPENDENT_AMBULATORY_CARE_PROVIDER_SITE_OTHER): Payer: Medicare Other | Admitting: Nurse Practitioner

## 2016-09-22 VITALS — BP 132/80 | HR 76 | Ht 61.0 in | Wt 210.0 lb

## 2016-09-22 DIAGNOSIS — D649 Anemia, unspecified: Secondary | ICD-10-CM

## 2016-09-22 LAB — CBC
HEMATOCRIT: 37.8 % (ref 36.0–46.0)
HEMOGLOBIN: 12.6 g/dL (ref 12.0–15.0)
MCHC: 33.3 g/dL (ref 30.0–36.0)
MCV: 92.5 fl (ref 78.0–100.0)
PLATELETS: 234 10*3/uL (ref 150.0–400.0)
RBC: 4.09 Mil/uL (ref 3.87–5.11)
RDW: 12.8 % (ref 11.5–15.5)
WBC: 6.6 10*3/uL (ref 4.0–10.5)

## 2016-09-22 NOTE — Progress Notes (Signed)
HPI:  Patient is a 66 year old female who I saw mid January for evaluation of altered bowel habits. She had been having  loose stool alternating with normal stools. C-diff was negative. I held one of her nutritional supplements called focus factor. Bowel movements improved but then she had a recurrence of intermittent loose stool several days later. Currently, her bowel movements are completely normal, she is not sure why. I don't think she ever even tried the Imodium. She hasn't started any new medications or made dietary changes. Patient feels well. No abdominal pain. No chest pain, no SOB.    Past Medical History:  Diagnosis Date  . Achilles tendonitis   . Anemia, iron deficiency 07/23/2012  . Asthma   . Cancer ([place]) 05/2012   plasma cell myeloma  . GERD (gastroesophageal reflux disease)   . Hyperlipidemia   . Hypertension   . Multiple sclerosis ([place])   . OA (osteoarthritis) of knee   . Obesity   . 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 S/P radiation therapy 08/12/12 - 09/21/12   left Anterior 2nd Rib Lesion / 50.4 GY / 28 Fractions  . Vertigo   . Vitamin D deficiency     Past Surgical History:  Procedure Laterality Date  . ABDOMINAL HYSTERECTOMY     fibroids  . BONE BIOPSY  05/17/12   Left 2nd Rib, Plasma Cell Myeloma  . BREAST SURGERY     left breast biopsy-benign  . CHOLECYSTECTOMY     lap choley  . ROTATOR CUFF REPAIR     Family History  Problem Relation Age of Onset  . Dementia Mother   . Hypertension Mother   . Lung cancer Father     +tobacco  . Diabetes Sister   . Colon cancer Brother     youngest brother  . Diabetes Sister     sister # 54  . Diabetes Brother   . Stomach cancer Neg Hx   . Pancreatic cancer Neg Hx    Social History  Substance Use Topics  . Smoking status: Former Smoker    Packs/day: 0.10    Years: 2.00    Types: Cigarettes    Start date: 10/05/1976    Quit date: 04/19/1979  . Smokeless tobacco: Never Used   Comment: quit 35 years ago   . Alcohol use No   Current Outpatient Prescriptions  Medication Sig Dispense Refill  . acetaminophen (TYLENOL) 325 MG tablet Take 650 mg by mouth every 6 (six) hours as needed.    Marland Kitchen albuterol (PROVENTIL HFA;VENTOLIN HFA) 108 (90 Base) MCG/ACT inhaler Inhale 2 puffs into the lungs every 6 (six) hours as needed (cough, shortness of breath or wheezing.). 1 Inhaler 5  . amLODipine (NORVASC) 10 MG tablet Take 1 tablet (10 mg total) by mouth daily. 90 tablet 3  . aspirin 81 MG tablet Take 81 mg by mouth daily.    Marland Kitchen atorvastatin (LIPITOR) 10 MG tablet Take 1 tablet (10 mg total) by mouth daily. 90 tablet 3  . chlorhexidine (PERIDEX) 0.12 % solution Use as directed 15 mLs in the mouth or throat 2 (two) times daily. 120 mL 0  . docusate sodium (COLACE) 100 MG capsule Take 100 mg by mouth as needed for constipation. Reported on 01/10/2016    . HYDROcodone-acetaminophen (NORCO/VICODIN) 5-325 MG tablet Take 1 tablet by mouth every 4 (four) hours as needed for moderate pain.    . Interferon Beta-1b (BETASERON) 0.3 MG KIT  injection Inject 0.25 mg into the skin every other day. 1 kit 11  . loratadine (CLARITIN) 10 MG tablet Take 10 mg by mouth daily as needed. Reported on 01/10/2016    . losartan (COZAAR) 50 MG tablet TAKE ONE TABLET BY MOUTH ONCE DAILY 90 tablet 1  . ranitidine (ZANTAC) 150 MG tablet Take 1 tablet (150 mg total) by mouth 2 (two) times daily. 180 tablet 3   No current facility-administered medications for this visit.    Allergies  Allergen Reactions  . Celebrex [Celecoxib]   . Dristan Other (See Comments)    "made my chest tight"  . Robitussin (Alcohol Free) [Guaifenesin] Other (See Comments)    "made my chest tight"  . Ropinirole Hcl Other (See Comments)    "made my chest tight"  . Tramadol Other (See Comments)    "made my chest tight"    Physical Exam: BP 132/80   Pulse 76   Ht '5\' 1"'  (1.549 m)   Wt 210 lb (95.3 kg)   BMI 39.68 kg/m  Constitutional:   Obese black female in no acute distress. Psychiatric: Normal mood and affect. Behavior is normal. EENT: Pupils equal and round.  Conjunctivae are normal. No scleral icterus. Neck supple.  Cardiovascular: Normal rate, regular rhythm.  Pulmonary/chest: Effort normal and breath sounds normal. No wheezing, rales or rhonchi. Abdominal: Soft, nondistended, nontender. Bowel sounds active throughout. There are no masses palpable. No hepatomegaly. Extremities: no edema Lymphadenopathy: No cervical adenopathy noted. Neurological: Alert and oriented to person place and time. Skin: Skin is warm and dry. No rashes noted.   ASSESSMENT AND PLAN:  18. 66 year old female here for follow-up of bowel habit changes she was experiencing a couple months back. For unclear reasons her stools have returned to solid , no more than 1-2 times a day. I don't think she ever even tried the Imodium. Her C. difficile was negative when I saw her in January . At this point will not make any changes. Patient will call me if she has recurrent bowel changes. She is up-to-date on her colon cancer screening, last one in 2010  2. Hx of anemia, iron on home med list but patient brings in meds and not taking iron. Last hgb was 11.8 with normal MCV. I looked at her bag of medications and apparently not taking the iron.  I do not know if iron was discontinued by PCP. I'll re-check a CBC and call her with the results  3. Hx of GERD, on zantac and asymptomatic. Zantac listed as twice day on home med list but she is actually only taking it once a day so we will keep it at that for now  09/22/16.  Tye Savoy, NP  09/22/2016, 10:26 AM

## 2016-09-22 NOTE — Patient Instructions (Signed)
Please go to the basement level to have your labs drawn.  Call our office if you have recurrent diarrhea.

## 2016-09-23 NOTE — Progress Notes (Signed)
I agree with the above note, plan 

## 2016-09-29 ENCOUNTER — Telehealth: Payer: Self-pay | Admitting: Nurse Practitioner

## 2016-09-29 NOTE — Telephone Encounter (Signed)
Calls because she has had 3 diarrhea stools today. Yesterday she had normal stools. No nausea, bloody diarrhea or abdominal pain. She also took her Rx iron medication. Reviewed her labs with her again and advised her again she does not need to take iron supplements. Any advise on the diarrhea?

## 2016-10-02 ENCOUNTER — Other Ambulatory Visit: Payer: Self-pay | Admitting: Internal Medicine

## 2016-10-02 DIAGNOSIS — Z1231 Encounter for screening mammogram for malignant neoplasm of breast: Secondary | ICD-10-CM

## 2016-10-05 ENCOUNTER — Emergency Department (HOSPITAL_COMMUNITY): Payer: Medicare Other

## 2016-10-05 ENCOUNTER — Encounter (HOSPITAL_COMMUNITY): Payer: Self-pay | Admitting: Emergency Medicine

## 2016-10-05 ENCOUNTER — Emergency Department (HOSPITAL_COMMUNITY)
Admission: EM | Admit: 2016-10-05 | Discharge: 2016-10-05 | Disposition: A | Discharge status: Home / Self Care | Payer: Medicare Other | Attending: Emergency Medicine | Admitting: Emergency Medicine

## 2016-10-05 DIAGNOSIS — Y9389 Activity, other specified: Secondary | ICD-10-CM | POA: Insufficient documentation

## 2016-10-05 DIAGNOSIS — S93402A Sprain of unspecified ligament of left ankle, initial encounter: Secondary | ICD-10-CM | POA: Diagnosis not present

## 2016-10-05 DIAGNOSIS — S99912A Unspecified injury of left ankle, initial encounter: Secondary | ICD-10-CM | POA: Diagnosis present

## 2016-10-05 DIAGNOSIS — Z7982 Long term (current) use of aspirin: Secondary | ICD-10-CM | POA: Diagnosis not present

## 2016-10-05 DIAGNOSIS — Y999 Unspecified external cause status: Secondary | ICD-10-CM | POA: Insufficient documentation

## 2016-10-05 DIAGNOSIS — I10 Essential (primary) hypertension: Secondary | ICD-10-CM | POA: Insufficient documentation

## 2016-10-05 DIAGNOSIS — Y9222 Religious institution as the place of occurrence of the external cause: Secondary | ICD-10-CM | POA: Insufficient documentation

## 2016-10-05 DIAGNOSIS — J45909 Unspecified asthma, uncomplicated: Secondary | ICD-10-CM | POA: Diagnosis not present

## 2016-10-05 DIAGNOSIS — Z87891 Personal history of nicotine dependence: Secondary | ICD-10-CM | POA: Diagnosis not present

## 2016-10-05 DIAGNOSIS — W1839XA Other fall on same level, initial encounter: Secondary | ICD-10-CM | POA: Diagnosis not present

## 2016-10-05 DIAGNOSIS — Z8579 Personal history of other malignant neoplasms of lymphoid, hematopoietic and related tissues: Secondary | ICD-10-CM | POA: Insufficient documentation

## 2016-10-05 NOTE — ED Notes (Signed)
Patient has own ASO ankle brace.

## 2016-10-05 NOTE — ED Triage Notes (Addendum)
Pt reports falling this morning at church c/o left foot/toe pain. Denies hitting head, no blood thinners. Pt has been ambulatory since incident, just painful when walking.

## 2016-10-05 NOTE — ED Notes (Addendum)
Patient reports missing a step at church, falling onto tile floor. Patient c/o left ankle and lower leg pain. Patient was wearing an ace wrap type bandage on arrival, was removed in XR. Patient is A&Ox4, NAD noted. Patient denies LOC. Denies striking head, no blood thinners noted on med list. Patient states she was able to walk on the ankle after the fall. Patient has not taken anything for pain. Patient is accompanied by her brother today.

## 2016-10-05 NOTE — ED Notes (Signed)
Patient returned from XR. 

## 2016-10-05 NOTE — ED Provider Notes (Signed)
[name] DEPT Provider Note   CSN: [id number] Arrival date & time: 10/05/16  1445 By signing my name below, I, Morgan Espinoza, attest that this documentation has been prepared under the direction and in the presence of non-physician practitioner, Morgan Hopping, PA-C. Electronically Signed: Dyke Espinoza, Scribe. 10/05/2016. 5:05 PM  History   Chief Complaint Chief Complaint  Patient presents with  . Fall   HPI Morgan Espinoza is a 66 y.o. female with a history of achilles tendonitis, HTN, and MS who presents to the Emergency Department complaining of sudden onset, constant 5/10 left foot pain s/p fall this morning. Pt states she accidentally stepped off a small platform at church, causing her to lose her balance, landing hard on her left foot. She notes associated swelling to the area. She has ambulated since the fall, but states pain is exacerbated with ambulation. Pt reports taking Tylenol with some relief of pain. When asked about her elevated blood pressure during exam, she states "it's always been up and down". Pt states she has been compliant with her blood pressure medications. She denies any head injury, dizziness, CP, SOB, neck/back pain, headache, neuro deficits, or any other complaints.      The history is provided by the patient. No language interpreter was used.   Past Medical History:  Diagnosis Date  . Achilles tendonitis   . Anemia, iron deficiency 07/23/2012  . Asthma   . Cancer ([place]) 05/2012   plasma cell myeloma  . GERD (gastroesophageal reflux disease)   . Hyperlipidemia   . Hypertension   . Multiple sclerosis ([place])   . OA (osteoarthritis) of knee   . Obesity   . 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 S/P radiation therapy 08/12/12 - 09/21/12   left Anterior 2nd Rib Lesion / 50.4 GY / 28 Fractions  . Vertigo   . Vitamin D deficiency     Patient Active Problem List   Diagnosis Date Noted  . Lytic bone lesions on xray 06/03/2016  . Primary  osteoarthritis of both knees 10/12/2014  . B12 deficiency 10/12/2014  . 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 Hyperlipidemia   . Vertigo   . Vitamin D deficiency   . Obesity   . Achilles tendonitis   . Anemia, iron deficiency 07/23/2012  . Plasma cell myeloma ([place]) 05/17/2012  . Rib lesion 04/28/2012  . Hypertension 06/05/2011  . Asthma 06/05/2011  . GERD (gastroesophageal reflux disease) 06/05/2011  . MS (multiple sclerosis) ([place]) 06/05/2011    Past Surgical History:  Procedure Laterality Date  . ABDOMINAL HYSTERECTOMY     fibroids  . BONE BIOPSY  05/17/12   Left 2nd Rib, Plasma Cell Myeloma  . BREAST SURGERY     left breast biopsy-benign  . CHOLECYSTECTOMY     lap choley  . ROTATOR CUFF REPAIR      OB History    No data available       Home Medications    Prior to Admission medications   Medication Sig Start Date End Date Taking? Authorizing Provider  acetaminophen (TYLENOL) 325 MG tablet Take 650 mg by mouth every 6 (six) hours as needed.    Historical Provider, MD  albuterol (PROVENTIL HFA;VENTOLIN HFA) 108 (90 Base) MCG/ACT inhaler Inhale 2 puffs into the lungs every 6 (six) hours as needed (cough, shortness of breath or wheezing.). 12/05/15   Morgan Russian, MD  amLODipine (NORVASC) 10 MG tablet Take 1 tablet (10 mg total) by mouth daily. 01/10/16  Morgan Russian, MD  aspirin 81 MG tablet Take 81 mg by mouth daily.    Historical Provider, MD  atorvastatin (LIPITOR) 10 MG tablet Take 1 tablet (10 mg total) by mouth daily. 01/10/16   Morgan Russian, MD  chlorhexidine (PERIDEX) 0.12 % solution Use as directed 15 mLs in the mouth or throat 2 (two) times daily. 03/29/16   Robyn Haber, MD  HYDROcodone-acetaminophen (NORCO/VICODIN) 5-325 MG tablet Take 1 tablet by mouth every 4 (four) hours as needed for moderate pain.    Historical Provider, MD  Interferon Beta-1b (BETASERON) 0.3 MG KIT injection Inject 0.25 mg into the skin every other day. 09/01/16   Morgan Pacas, MD  loratadine  (CLARITIN) 10 MG tablet Take 10 mg by mouth daily as needed. Reported on 01/10/2016    Historical Provider, MD  losartan (COZAAR) 50 MG tablet TAKE ONE TABLET BY MOUTH ONCE DAILY 06/03/16   Morgan Jeffery, PA-C  ranitidine (ZANTAC) 150 MG tablet Take 1 tablet (150 mg total) by mouth 2 (two) times daily. Patient taking differently: Take 150 mg by mouth every morning.  01/10/16   Morgan Russian, MD    Family History Family History  Problem Relation Age of Onset  . Dementia Mother   . Hypertension Mother   . Lung cancer Father     +tobacco  . Diabetes Sister   . Colon cancer Brother     youngest brother  . Diabetes Sister     sister # 21  . Diabetes Brother   . Stomach cancer Neg Hx   . Pancreatic cancer Neg Hx     Social History Social History  Substance Use Topics  . Smoking status: Former Smoker    Packs/day: 0.10    Years: 2.00    Types: Cigarettes    Start date: 10/05/1976    Quit date: 04/19/1979  . Smokeless tobacco: Never Used     Comment: quit 35 years ago   . Alcohol use No    Allergies   Celebrex [celecoxib]; Dristan; Robitussin (alcohol free) [guaifenesin]; Ropinirole hcl; and Tramadol  Review of Systems Review of Systems  Respiratory: Negative for shortness of breath.   Cardiovascular: Negative for chest pain.  Gastrointestinal: Negative for abdominal pain, nausea and vomiting.  Musculoskeletal: Positive for arthralgias and joint swelling. Negative for back pain and neck pain.  Neurological: Negative for dizziness, syncope, weakness, light-headedness, numbness and headaches.  All other systems reviewed and are negative.  Physical Exam Updated Vital Signs BP 178/97 (BP Location: Right Arm)   Pulse 79   Temp 98.5 F (36.9 C) (Oral)   Resp 16   Ht '5\' 1"'  (1.549 m)   Wt 210 lb (95.3 kg)   SpO2 98%   BMI 39.68 kg/m   Physical Exam  Constitutional: She appears well-developed and well-nourished. No distress.  HENT:  Head: Normocephalic and atraumatic.    Mouth/Throat: Oropharynx is clear and moist.  Eyes: Conjunctivae and EOM are normal. Pupils are equal, round, and reactive to light.  Neck: Normal range of motion. Neck supple.  Cardiovascular: Normal rate, regular rhythm, normal heart sounds and intact distal pulses.   Pulmonary/Chest: Effort normal and breath sounds normal. No respiratory distress.  Abdominal: Soft. There is no tenderness. There is no guarding.  Musculoskeletal: She exhibits tenderness. She exhibits no edema.  Tenderness and minor swelling over the left anterior ankle extending to the lateral malleolus. Full range of motion in left ankle. No noted deformity or crepitus. Normal motor function  intact in all other extremities and spine. No midline spinal tenderness.   Neurological: She is alert.  No sensory deficits. Strength 5/5 in all extremities. No gait disturbance. Coordination intact including heel to shin and finger to nose. Cranial nerves III-XII grossly intact. No facial droop.   Skin: Skin is warm and dry. Capillary refill takes less than 2 seconds. She is not diaphoretic.  Psychiatric: She has a normal mood and affect. Her behavior is normal.  Nursing note and vitals reviewed.  ED Treatments / Results  DIAGNOSTIC STUDIES:  Oxygen Saturation is 98% on RA, normal by my interpretation.    COORDINATION OF CARE:  4:25 PM Discussed treatment plan with pt at bedside and pt agreed to plan.   Labs (all labs ordered are listed, but only abnormal results are displayed) Labs Reviewed - No data to display  EKG  EKG Interpretation None       Radiology Dg Ankle Complete Left  Result Date: 10/05/2016 CLINICAL DATA:  [place] at [place] this morning. LEFT ankle pain with walking. EXAM: LEFT ANKLE COMPLETE - 3+ VIEW COMPARISON:  LEFT foot radiograph October 05, 2016 at 3:41 p.m. FINDINGS: No fracture deformity nor dislocation. The ankle mortise appears congruent and the tibiofibular syndesmosis intact. No destructive bony  lesions. Mild diffuse soft tissue swelling without subcutaneous gas or radiopaque foreign bodies. IMPRESSION: Soft tissue swelling without acute osseous process. Electronically Signed   By: Elon Alas M.D.   On: 10/05/2016 16:57   Dg Foot Complete Left  Result Date: 10/05/2016 CLINICAL DATA:  Pt c/o pain across all metatarsals s/p twist, trip, off of platform at church x 3 hours ago. EXAM: LEFT FOOT - COMPLETE 3+ VIEW COMPARISON:  None. FINDINGS: No fracture.  No bone lesion. The joints are normally spaced and aligned. No significant arthropathic change. The soft tissues are unremarkable. IMPRESSION: Negative. Electronically Signed   By: Lajean Manes M.D.   On: 10/05/2016 15:47   Procedures Procedures (including critical care time)  Medications Ordered in ED Medications - No data to display  Initial Impression / Assessment and Plan / ED Course  I have reviewed the triage vital signs and the nursing notes.  Pertinent labs & imaging results that were available during my care of the patient were reviewed by me and considered in my medical decision making (see chart for details).     Patient presents with left foot and ankle pain following a stumble and fall. No neuro or functional deficits. Patient ambulated without difficulty or assistance. No acute abnormalities on x-rays. Orthopedic follow-up. Home care and return precautions discussed. Patient voices understanding of all instructions and is comfortable with discharge.  Findings and plan of care discussed with Jola Schmidt, MD. Dr. Venora Maples personally evaluated and examined this patient.  Vitals:   10/05/16 1510 10/05/16 1520 10/05/16 1751  BP: 178/97  144/78  Pulse: 79  72  Resp: 16  16  Temp: 98.5 F (36.9 C)    TempSrc: Oral    SpO2: 98%  98%  Weight:  95.3 kg   Height:  '5\' 1"'  (1.549 m)        Final Clinical Impressions(s) / ED Diagnoses   Final diagnoses:  Sprain of left ankle, unspecified ligament, initial  encounter   New Prescriptions Discharge Medication List as of 10/05/2016  5:22 PM     I personally performed the services described in this documentation, which was scribed in my presence. The recorded information has been reviewed and is accurate.  Lorayne Bender, PA-C 10/07/16 [place], MD 10/07/16 1044

## 2016-10-05 NOTE — Discharge Instructions (Signed)
There were no acute abnormalities on x-ray today. No evidence of fracture. Use the ankle brace for support and comfort. Weightbearing as tolerated. Elevate the extremity whenever possible. To reduce pain and swelling. If you are able to take medications like ibuprofen, these medications may be used to reduce pain and swelling. Otherwise, you may use Tylenol.  Follow-up with orthopedics on this matter. Call the number provided set up an appointment.  Your blood pressure was noted to be elevated today. Please follow up with her primary care provider as soon as possible to decide if a medication adjustment is necessary.

## 2016-10-08 NOTE — Telephone Encounter (Signed)
Spoke with the patient. She has mostly normal twice a day stools. She did not stop her iron supplement as instructed. Reminded to stop it again. Also offered she should observe if there is a connection with her diet and her bowel movements.

## 2016-10-08 NOTE — Telephone Encounter (Signed)
Not overly concerned if just having sporadic diarrhea since other stools are solid. Obviously if having loose stools several times a week then could be problematic and she should let us know. Also, if abdominal pain, blood in stools or weight loss then we need to know.

## 2016-10-23 ENCOUNTER — Encounter: Payer: Self-pay | Admitting: Nurse Practitioner

## 2016-10-23 ENCOUNTER — Ambulatory Visit (INDEPENDENT_AMBULATORY_CARE_PROVIDER_SITE_OTHER): Payer: Medicare Other | Admitting: Nurse Practitioner

## 2016-10-23 VITALS — BP 132/76 | HR 92 | Ht 61.0 in | Wt 211.0 lb

## 2016-10-23 DIAGNOSIS — G35 Multiple sclerosis: Secondary | ICD-10-CM

## 2016-10-23 DIAGNOSIS — G3184 Mild cognitive impairment, so stated: Secondary | ICD-10-CM

## 2016-10-23 DIAGNOSIS — I10 Essential (primary) hypertension: Secondary | ICD-10-CM | POA: Diagnosis not present

## 2016-10-23 DIAGNOSIS — E559 Vitamin D deficiency, unspecified: Secondary | ICD-10-CM

## 2016-10-23 DIAGNOSIS — G35D Multiple sclerosis, unspecified: Secondary | ICD-10-CM

## 2016-10-23 NOTE — Progress Notes (Signed)
I have reviewed and agreed above plan. 

## 2016-10-23 NOTE — Progress Notes (Signed)
[facility]  PATIENT: Morgan Espinoza DOB: 05-Aug-1951   REASON FOR VISIT: Follow-up for relapsing remitting remitting multiple sclerosis, mild cognitive impairment , vitamin D deficiency HISTORY FROM: Patient    HISTORY OF PRESENT ILLNESS:Morgan Espinoza is a 66 years old right-handed female, follow-up for multiple sclerosis, her primary care physician is Dr. 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 She denies difficulty walking, strokelike symptoms, she denies paresthesia, no gait difficulty, no bowel bladder incontinence.  She has been treated with Betaseron injection since the diagnosis, she tolerates it well, no significant side effect, she has not had flareup for many years.   She was a patient of Dr. Erling Cruz. She also takes medicine for hypertension, hyperlipidemia, iron supplements, stomach problems, I reviewed laboratory evaluations in 2017 there was no significant abnormality in CMP, CBC, ferritin level, TSH   UPDATE Apr 22 2016:YY She retired from school system, no new symptoms from [place],  She also has a diagnosis of plasma cells myeloma, I have personally reviewed CT chest in September 2013, chest x-ray in June 2013, she has stable second rib expansile lytic lesion. She is receiving monthly treatment at [facility].  She apparently has some memory trouble, could not elaborate on the details,  she lives by herself, she denies significant difficulty driving, just retired as a Teacher, early years/pre for [employer].  I reviewed her previous oncologist note from Dr.Ennever, she was receiving zometa monthly and iv iron infusion as needed. We have personally reviewed MRI of the brain with and without  contrast on March 03 2016, Morgan Espinoza T-2/flair hyperdensity paraventricular subcortical white matters to her relations at both hemisphere and in the pons, there are many microhemorrhage within the subcortical and deep white matter of the left frontal and temporal lobes, there was no acute findings, she denied a family history of similar disease. Her mother has dementia in her elderly age, died at age 2.  She has one daughter lives in [place]  Reviewed laboratory since 2017, ferritin 883 iron 58, TIBC 242,normal CMP,LDL 70,  cholesterol 188, hepatitis C antibody negative,normal CBC, hemoglobin 11 point 8, igG   Kappa free light chain was elevated 25.4 Normal B12 520 in January 2016,low vitamin D level 20  in July 2014, normal TSH 0.77 UPDATE 03/15/2018CM Morgan Espinoza, 66 year old female returns for follow-up with history of relapsing remitting multiple sclerosis, mild cognitive impairment and low vitamin D level in the past. For her multiple sclerosis she is on Betaseron every other day, she denies any injection site problems, she has not had any relapses in some time. She denies any weakness sensory changes visual disturbance, speech or swallowing problems spasticity problems with bowel or bladder control. She denies any falls. She continues to live alone, independent in all activities of daily living In addition she has mild cognitive impairment, MMSE 26 out of 30, she feels her memory is stable. She returns for reevaluation. She gets little exercise since retirement.    REVIEW OF SYSTEMS: Full 14 system review of systems performed and notable only for those listed, all others are neg:  Constitutional: neg  Cardiovascular: neg Ear/Nose/Throat: neg  Skin: neg Eyes: neg Respiratory: neg Gastroitestinal: neg  Hematology/Lymphatic: neg  Endocrine: neg Musculoskeletal:neg Allergy/Immunology: neg Neurological: neg Psychiatric: neg Sleep : neg   ALLERGIES: Allergies  Allergen Reactions  . Celebrex  [Celecoxib]   . Dristan Other (See Comments)    "made my chest tight"  . Robitussin (Alcohol Free) [Guaifenesin] Other (See Comments)    "made my chest tight"  . Ropinirole Hcl Other (See Comments)    "made my chest tight"  . Tramadol Other (See Comments)    "made my chest tight"    HOME MEDICATIONS: Outpatient Medications Prior to Visit  Medication Sig Dispense Refill  . acetaminophen (TYLENOL) 325 MG tablet Take 650 mg by mouth every 6 (six) hours as needed.    Marland Kitchen albuterol (PROVENTIL HFA;VENTOLIN HFA) 108 (90 Base) MCG/ACT inhaler Inhale 2 puffs into the lungs every 6 (six) hours as needed (cough, shortness of breath or wheezing.). 1 Inhaler 5  . amLODipine (NORVASC) 10 MG tablet Take 1 tablet (10 mg total) by mouth daily. 90 tablet 3  . aspirin 81 MG tablet Take 81 mg by mouth daily.    Marland Kitchen atorvastatin (LIPITOR) 10 MG tablet Take 1 tablet (10 mg total) by mouth daily. 90 tablet 3  . HYDROcodone-acetaminophen (NORCO/VICODIN) 5-325 MG tablet Take 1 tablet by mouth every 4 (four) hours as needed for moderate pain.    . Interferon Beta-1b (BETASERON) 0.3 MG KIT injection Inject 0.25 mg into the skin every other day. 1 kit 11  . loratadine (CLARITIN) 10 MG tablet Take 10 mg by mouth daily as needed. Reported on 01/10/2016    . losartan (COZAAR) 50 MG tablet TAKE ONE TABLET BY MOUTH ONCE DAILY 90 tablet 1  . ranitidine (ZANTAC) 150 MG tablet Take 1 tablet (150 mg total) by mouth 2 (two) times daily. (Patient taking differently: Take 150 mg by mouth every morning. ) 180 tablet 3  . chlorhexidine (PERIDEX) 0.12 % solution Use as directed 15 mLs in the mouth or throat 2 (two) times daily. (Patient not taking: Reported on 10/23/2016) 120 mL 0   No facility-administered medications prior to visit.     PAST MEDICAL HISTORY: Past Medical History:  Diagnosis Date  . Achilles tendonitis   . Anemia, iron deficiency 07/23/2012  . Asthma   . Cancer ([place]) 05/2012   plasma cell myeloma  . GERD  (gastroesophageal reflux disease)   . Hyperlipidemia   . Hypertension   . Multiple sclerosis ([place])   . OA (osteoarthritis) of knee   . Obesity   . 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 S/P radiation therapy 08/12/12 - 09/21/12   left Anterior 2nd Rib Lesion / 50.4 GY / 28 Fractions  . Vertigo   . Vitamin D deficiency     PAST SURGICAL HISTORY: Past Surgical History:  Procedure Laterality Date  . ABDOMINAL HYSTERECTOMY     fibroids  . BONE BIOPSY  05/17/12   Left 2nd Rib, Plasma Cell Myeloma  . BREAST SURGERY     left breast biopsy-benign  . CHOLECYSTECTOMY     lap choley  . ROTATOR CUFF REPAIR      FAMILY HISTORY: Family History  Problem Relation Age of Onset  . Dementia Mother   . Hypertension Mother   . Lung cancer Father     +tobacco  . Diabetes Sister   . Colon cancer Brother     youngest brother  . Diabetes Sister     sister # [id number]  . Diabetes Brother   . Stomach cancer Neg Hx   .  Pancreatic cancer Neg Hx     SOCIAL HISTORY: Social History   Social History  . Marital status: Widowed    Spouse name: N/A  . Number of children: 1  . Years of education: 4   Occupational History  .  Retired  . Retired     Social History Main Topics  . Smoking status: Former Smoker    Packs/day: 0.10    Years: 2.00    Types: Cigarettes    Start date: 10/05/1976    Quit date: 04/19/1979  . Smokeless tobacco: Never Used     Comment: quit 35 years ago   . Alcohol use No  . Drug use: No  . Sexual activity: No   Other Topics Concern  . 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    Patient is retired.    Patient is widowed.    Patient is right handed.    Patient has a high school education.    Patient has 1 child.      PHYSICAL EXAM  Vitals:   10/23/16 0838  BP: 132/76  Pulse: 92  Weight: 211 lb (95.7 kg)  Height: _0  (1.549 m)   Body mass index is 39.87 kg/m.  Generalized:  Well developed, Obese female in no acute distress   Head: normocephalic and atraumatic,. Oropharynx benign  Neck: Supple, no carotid bruits  Musculoskeletal: No deformity   Neurological examination   Mentation: Alert  MMSE - Mini Mental State Exam 10/23/2016  Orientation to time 4  Orientation to Place 5  Registration 3  Attention/ Calculation 5  Recall 0  Language- name 2 objects 2  Language- repeat 1  Language- follow 3 step command 3  Language- read & follow direction 1  Write a sentence 1  Copy design 1  Total score 26  Last 21/30. AFt 7, Clock drawing 4/4.  Follows all commands speech and language fluent.   Cranial nerve II-XII: Fundoscopic exam reveals sharp disc margins.Pupils were equal round reactive to light extraocular movements were full, visual field were full on confrontational test. Facial sensation and strength were normal. hearing was intact to finger rubbing bilaterally. Uvula tongue midline. head turning and shoulder shrug were normal and symmetric.Tongue protrusion into cheek strength was normal. Motor: normal bulk and tone, full strength in the BUE, BLE, fine finger movements normal, no pronator drift. No focal weakness Sensory: normal and symmetric to light touch, pinprick, and  Vibration, in the upper and lower extremities Coordination: finger-nose-finger, heel-to-shin bilaterally, no dysmetria Reflexes: Brachioradialis 2/2, biceps 2/2, triceps 2/2, patellar 2/2, Achilles 2/2, plantar responses were flexor bilaterally. Gait and Station: Rising up from seated position with push off, normal stance,  moderate stride, good arm swing, smooth turning, able to perform tiptoe, and heel walking without difficulty. Tandem gait is mildly unsteady.   DIAGNOSTIC DATA (LABS, IMAGING, TESTING) - I reviewed patient records, labs, notes, testing and imaging myself where available.  Lab Results  Component Value Date   WBC 6.6 09/22/2016   HGB 12.6 09/22/2016   HCT 37.8  09/22/2016   MCV 92.5 09/22/2016   PLT 234.0 09/22/2016      Component Value Date/Time   NA 140 05/05/2016 1017   NA 144 11/01/2015 0927   K 3.7 05/05/2016 1017   K 3.8 11/01/2015 0927   CL 109 (H) 05/05/2016 1017   CO2 28 05/05/2016 1017   CO2 27  11/01/2015 0927   GLUCOSE 87 05/05/2016 1017   BUN 19 05/05/2016 1017   BUN 15.5 11/01/2015 0927   CREATININE 0.9 05/05/2016 1017   CREATININE 0.8 11/01/2015 0927   CALCIUM 8.9 05/05/2016 1017   CALCIUM 9.2 11/01/2015 0927   PROT 7.2 05/05/2016 1017   PROT 6.8 05/05/2016 1017   PROT 7.5 11/01/2015 0927   ALBUMIN 3.3 05/05/2016 1017   ALBUMIN 3.5 11/01/2015 0927   AST 42 (H) 05/05/2016 1017   AST 26 11/01/2015 0927   ALT 36 05/05/2016 1017   ALT 21 11/01/2015 0927   ALKPHOS 78 05/05/2016 1017   ALKPHOS 74 11/01/2015 0927   BILITOT 0.70 05/05/2016 1017   BILITOT 0.48 11/01/2015 0927   GFRNONAA >89 01/10/2016 1148   GFRAA >89 01/10/2016 1148   Lab Results  Component Value Date   CHOL 188 01/10/2016   HDL 104 01/10/2016   LDLCALC 70 01/10/2016   TRIG 68 01/10/2016   CHOLHDL 1.8 01/10/2016   No results found for: HGBA1C Lab Results  Component Value Date   VITAMINB12 520 08/12/2014    ASSESSMENT AND PLAN  66 y.o. year old female  has a past medical history of  Hyperlipidemia; Hypertension; Multiple sclerosis ([place]); OA (osteoarthritis) of knee;  Plasma cell myeloma ([place]) (05/17/12); S/P radiation therapy (08/12/12 - 09/21/12); Vitamin D deficiency. And mild cognitive impairment here to follow-up  PLAN: Continue Betaseron every other day CBC and CMP to monitor adverse effects of Betaseron Vitamin D level to recheck low level in the past, patient not currently taking supplement Will check TSH and Vit B12 due  mild cognitive impairment for treatable causes Will repeat MRI of the brain at next visit Follow up in 6 months Greater than 50% of time during this 25 minute visit was spent on counseling,explanation of diagnosis,  planning of further management, discussion with patient  and coordination of care Ileana Ladd NP, [facility], APRN  [facility] [address] [place], [place] [address] [phone]

## 2016-10-23 NOTE — Patient Instructions (Addendum)
Continue Betaseron every other day CBC and CMP to monitor adverse effects of Betaseron Vitamin D level TSH, B12  Will repeat MRI of the brain at next visit Follow up in 6 months

## 2016-10-24 ENCOUNTER — Telehealth: Payer: Self-pay | Admitting: *Deleted

## 2016-10-24 LAB — COMPREHENSIVE METABOLIC PANEL
ALT: 23 IU/L (ref 0–32)
AST: 30 IU/L (ref 0–40)
Albumin/Globulin Ratio: 1.5 (ref 1.2–2.2)
Albumin: 4.1 g/dL (ref 3.6–4.8)
Alkaline Phosphatase: 88 IU/L (ref 39–117)
BUN/Creatinine Ratio: 20 (ref 12–28)
BUN: 15 mg/dL (ref 8–27)
Bilirubin Total: 0.3 mg/dL (ref 0.0–1.2)
CALCIUM: 9.1 mg/dL (ref 8.7–10.3)
CO2: 24 mmol/L (ref 18–29)
CREATININE: 0.74 mg/dL (ref 0.57–1.00)
Chloride: 103 mmol/L (ref 96–106)
GFR, EST AFRICAN AMERICAN: 98 mL/min/{1.73_m2} (ref >59)
GFR, EST NON AFRICAN AMERICAN: 85 mL/min/{1.73_m2} (ref >59)
GLUCOSE: 96 mg/dL (ref 65–99)
Globulin, Total: 2.7 g/dL (ref 1.5–4.5)
Potassium: 3.9 mmol/L (ref 3.5–5.2)
Sodium: 145 mmol/L — ABNORMAL HIGH (ref 134–144)
TOTAL PROTEIN: 6.8 g/dL (ref 6.0–8.5)

## 2016-10-24 LAB — CBC WITH DIFFERENTIAL/PLATELET
BASOS: 1 %
Basophils Absolute: 0 10*3/uL (ref 0.0–0.2)
EOS (ABSOLUTE): 0.8 10*3/uL — ABNORMAL HIGH (ref 0.0–0.4)
Eos: 12 %
HEMOGLOBIN: 11.6 g/dL (ref 11.1–15.9)
Hematocrit: 34.8 % (ref 34.0–46.6)
IMMATURE GRANS (ABS): 0 10*3/uL (ref 0.0–0.1)
IMMATURE GRANULOCYTES: 0 %
LYMPHS: 20 %
Lymphocytes Absolute: 1.3 10*3/uL (ref 0.7–3.1)
MCH: 30.5 pg (ref 26.6–33.0)
MCHC: 33.3 g/dL (ref 31.5–35.7)
MCV: 92 fL (ref 79–97)
MONOCYTES: 11 %
Monocytes Absolute: 0.7 10*3/uL (ref 0.1–0.9)
NEUTROS ABS: 3.7 10*3/uL (ref 1.4–7.0)
NEUTROS PCT: 56 %
Platelets: 207 10*3/uL (ref 150–379)
RBC: 3.8 x10E6/uL (ref 3.77–5.28)
RDW: 13.2 % (ref 12.3–15.4)
WBC: 6.5 10*3/uL (ref 3.4–10.8)

## 2016-10-24 LAB — VITAMIN B12: Vitamin B-12: 290 pg/mL (ref 232–1245)

## 2016-10-24 LAB — TSH: TSH: 0.996 u[IU]/mL (ref 0.450–4.500)

## 2016-10-24 LAB — VITAMIN D 25 HYDROXY (VIT D DEFICIENCY, FRACTURES): VIT D 25 HYDROXY: 13.5 ng/mL — AB (ref 30.0–100.0)

## 2016-10-24 NOTE — Telephone Encounter (Signed)
-----   Message from Morgan Bible, NP sent at 10/24/2016 11:35 AM EDT ----- Discussed results with Dr. Krista Blue . B12 level low end of normal. Take OTC B12 supplement 1 daily. Vit D level extremely low. Get OTC Vit D and take 2 tabs every day till next visit. Will repeat level then. Please call the pt.

## 2016-10-24 NOTE — Telephone Encounter (Signed)
LMVM for pt that calling about lab results.  Will call back Monday.

## 2016-10-27 NOTE — Telephone Encounter (Signed)
LMVM for pt to return call for labs.

## 2016-10-29 ENCOUNTER — Telehealth: Payer: Self-pay | Admitting: *Deleted

## 2016-10-29 NOTE — Telephone Encounter (Signed)
-----   Message from Dennie Bible, NP sent at 10/24/2016 11:35 AM EDT ----- Discussed results with Dr. Krista Blue . B12 level low end of normal. Take OTC B12 supplement 1 daily. Vit D level extremely low. Get OTC Vit D and take 2 tabs every day till next visit. Will repeat level then. Please call the pt.

## 2016-10-29 NOTE — Telephone Encounter (Signed)
Spoke to pt and relayed the message about her lab results.  (low Vit B 12 and Vit D).  OTC supplements Vit B12 1068mcg daily and Vit D 2000units (2 tabs daily).  Pt will also speak to pharmacist.  Has appt  With pcp tomorrow as well.  Will get vit D level on her next visit with [place] in 04-2017.  Pt verbalized understanding.

## 2016-10-30 ENCOUNTER — Encounter: Payer: Self-pay | Admitting: Internal Medicine

## 2016-10-30 ENCOUNTER — Ambulatory Visit (INDEPENDENT_AMBULATORY_CARE_PROVIDER_SITE_OTHER): Payer: Medicare Other | Admitting: Internal Medicine

## 2016-10-30 VITALS — BP 124/70 | HR 76 | Temp 97.8°F | Ht 61.0 in | Wt 212.1 lb

## 2016-10-30 DIAGNOSIS — J4520 Mild intermittent asthma, uncomplicated: Secondary | ICD-10-CM

## 2016-10-30 DIAGNOSIS — E538 Deficiency of other specified B group vitamins: Secondary | ICD-10-CM

## 2016-10-30 DIAGNOSIS — Z13820 Encounter for screening for osteoporosis: Secondary | ICD-10-CM

## 2016-10-30 DIAGNOSIS — E7800 Pure hypercholesterolemia, unspecified: Secondary | ICD-10-CM | POA: Diagnosis not present

## 2016-10-30 DIAGNOSIS — Z0000 Encounter for general adult medical examination without abnormal findings: Secondary | ICD-10-CM

## 2016-10-30 DIAGNOSIS — K219 Gastro-esophageal reflux disease without esophagitis: Secondary | ICD-10-CM

## 2016-10-30 DIAGNOSIS — I10 Essential (primary) hypertension: Secondary | ICD-10-CM

## 2016-10-30 DIAGNOSIS — E559 Vitamin D deficiency, unspecified: Secondary | ICD-10-CM

## 2016-10-30 NOTE — Assessment & Plan Note (Signed)
BP well controlled Current regimen effective and well tolerated Continue current medications at current doses  

## 2016-10-30 NOTE — Patient Instructions (Addendum)
Morgan Espinoza , Thank you for taking time to come for your Medicare Wellness Visit. I appreciate your ongoing commitment to your health goals. Please review the following plan we discussed and let me know if I can assist you in the future.   These are the goals we discussed: Goals    Start regular exercise      This is a list of the screening recommended for you and due dates:  Health Maintenance  Topic Date Due  . DEXA scan (bone density measurement)  01/09/2016  . Pap Smear  11/02/2016  . Pneumonia vaccines (2 of 2 - PPSV23) 02/01/2017  . Mammogram  11/11/2017  . Colon Cancer Screening  01/30/2019  . Tetanus Vaccine  11/15/2025  . Flu Shot  Completed  .  Hepatitis C: One time screening is recommended by Center for Disease Control  (CDC) for  adults born from [birth year] through 1965.   Completed  . HIV Screening  Completed      All other Health Maintenance issues reviewed.   All recommended immunizations and age-appropriate screenings are up-to-date or discussed.  No immunizations administered today.   Medications reviewed and updated.  No changes recommended at this time.   A dexa was ordered  Please followup in 6 months  Health Maintenance, Female Adopting a healthy lifestyle and getting preventive care can go a long way to promote health and wellness. Talk with your health care provider about what schedule of regular examinations is right for you. This is a good chance for you to check in with your provider about disease prevention and staying healthy. In between checkups, there are plenty of things you can do on your own. Experts have done a lot of research about which lifestyle changes and preventive measures are most likely to keep you healthy. Ask your health care provider for more information. Weight and diet Eat a healthy diet  Be sure to include plenty of vegetables, fruits, low-fat dairy products, and lean protein.  Do not eat a lot of foods high in solid fats, added  sugars, or salt.  Get regular exercise. This is one of the most important things you can do for your health.  Most adults should exercise for at least 150 minutes each week. The exercise should increase your heart rate and make you sweat (moderate-intensity exercise).  Most adults should also do strengthening exercises at least twice a week. This is in addition to the moderate-intensity exercise. Maintain a healthy weight  Body mass index (BMI) is a measurement that can be used to identify possible weight problems. It estimates body fat based on height and weight. Your health care provider can help determine your BMI and help you achieve or maintain a healthy weight.  For females 76 years of age and older:  A BMI below 18.5 is considered underweight.  A BMI of 18.5 to 24.9 is normal.  A BMI of 25 to 29.9 is considered overweight.  A BMI of 30 and above is considered obese. Watch levels of cholesterol and blood lipids  You should start having your blood tested for lipids and cholesterol at 66 years of age, then have this test every 5 years.  You may need to have your cholesterol levels checked more often if:  Your lipid or cholesterol levels are high.  You are older than 66 years of age.  You are at high risk for heart disease. Cancer screening Lung Cancer  Lung cancer screening is recommended for adults 55-80 years  old who are at high risk for lung cancer because of a history of smoking.  A yearly low-dose CT scan of the lungs is recommended for people who:  Currently smoke.  Have quit within the past 15 years.  Have at least a 30-pack-year history of smoking. A pack year is smoking an average of one pack of cigarettes a day for 1 year.  Yearly screening should continue until it has been 15 years since you quit.  Yearly screening should stop if you develop a health problem that would prevent you from having lung cancer treatment. Breast Cancer  Practice breast  self-awareness. This means understanding how your breasts normally appear and feel.  It also means doing regular breast self-exams. Let your health care provider know about any changes, no matter how small.  If you are in your 20s or 30s, you should have a clinical breast exam (CBE) by a health care provider every 1-3 years as part of a regular health exam.  If you are 50 or older, have a CBE every year. Also consider having a breast X-ray (mammogram) every year.  If you have a family history of breast cancer, talk to your health care provider about genetic screening.  If you are at high risk for breast cancer, talk to your health care provider about having an MRI and a mammogram every year.  Breast cancer gene (BRCA) assessment is recommended for women who have family members with BRCA-related cancers. BRCA-related cancers include:  Breast.  Ovarian.  Tubal.  Peritoneal cancers.  Results of the assessment will determine the need for genetic counseling and BRCA1 and BRCA2 testing. Cervical Cancer  Your health care provider may recommend that you be screened regularly for cancer of the pelvic organs (ovaries, uterus, and vagina). This screening involves a pelvic examination, including checking for microscopic changes to the surface of your cervix (Pap test). You may be encouraged to have this screening done every 3 years, beginning at age 24.  For women ages 83-65, health care providers may recommend pelvic exams and Pap testing every 3 years, or they may recommend the Pap and pelvic exam, combined with testing for human papilloma virus (HPV), every 5 years. Some types of HPV increase your risk of cervical cancer. Testing for HPV may also be done on women of any age with unclear Pap test results.  Other health care providers may not recommend any screening for nonpregnant women who are considered low risk for pelvic cancer and who do not have symptoms. Ask your health care provider if a  screening pelvic exam is right for you.  If you have had past treatment for cervical cancer or a condition that could lead to cancer, you need Pap tests and screening for cancer for at least 20 years after your treatment. If Pap tests have been discontinued, your risk factors (such as having a new sexual partner) need to be reassessed to determine if screening should resume. Some women have medical problems that increase the chance of getting cervical cancer. In these cases, your health care provider may recommend more frequent screening and Pap tests. Colorectal Cancer  This type of cancer can be detected and often prevented.  Routine colorectal cancer screening usually begins at 66 years of age and continues through 66 years of age.  Your health care provider may recommend screening at an earlier age if you have risk factors for colon cancer.  Your health care provider may also recommend using home test kits to  check for hidden blood in the stool.  A small camera at the end of a tube can be used to examine your colon directly (sigmoidoscopy or colonoscopy). This is done to check for the earliest forms of colorectal cancer.  Routine screening usually begins at age 71.  Direct examination of the colon should be repeated every 5-10 years through 66 years of age. However, you may need to be screened more often if early forms of precancerous polyps or small growths are found. Skin Cancer  Check your skin from head to toe regularly.  Tell your health care provider about any new moles or changes in moles, especially if there is a change in a mole's shape or color.  Also tell your health care provider if you have a mole that is larger than the size of a pencil eraser.  Always use sunscreen. Apply sunscreen liberally and repeatedly throughout the day.  Protect yourself by wearing long sleeves, pants, a wide-brimmed hat, and sunglasses whenever you are outside. Heart disease, diabetes, and high  blood pressure  High blood pressure causes heart disease and increases the risk of stroke. High blood pressure is more likely to develop in:  People who have blood pressure in the high end of the normal range (130-139/85-89 mm Hg).  People who are overweight or obese.  People who are African American.  If you are 15-40 years of age, have your blood pressure checked every 3-5 years. If you are 20 years of age or older, have your blood pressure checked every year. You should have your blood pressure measured twice-once when you are at a hospital or clinic, and once when you are not at a hospital or clinic. Record the average of the two measurements. To check your blood pressure when you are not at a hospital or clinic, you can use:  An automated blood pressure machine at a pharmacy.  A home blood pressure monitor.  If you are between 50 years and 58 years old, ask your health care provider if you should take aspirin to prevent strokes.  Have regular diabetes screenings. This involves taking a blood sample to check your fasting blood sugar level.  If you are at a normal weight and have a low risk for diabetes, have this test once every three years after 66 years of age.  If you are overweight and have a high risk for diabetes, consider being tested at a younger age or more often. Preventing infection Hepatitis B  If you have a higher risk for hepatitis B, you should be screened for this virus. You are considered at high risk for hepatitis B if:  You were born in a country where hepatitis B is common. Ask your health care provider which countries are considered high risk.  Your parents were born in a high-risk country, and you have not been immunized against hepatitis B (hepatitis B vaccine).  You have HIV or AIDS.  You use needles to inject street drugs.  You live with someone who has hepatitis B.  You have had sex with someone who has hepatitis B.  You get hemodialysis  treatment.  You take certain medicines for conditions, including cancer, organ transplantation, and autoimmune conditions. Hepatitis C  Blood testing is recommended for:  Everyone born from [birth year] through 1965.  Anyone with known risk factors for hepatitis C. Sexually transmitted infections (STIs)  You should be screened for sexually transmitted infections (STIs) including gonorrhea and chlamydia if:  You are sexually active and  are younger than 66 years of age.  You are older than 66 years of age and your health care provider tells you that you are at risk for this type of infection.  Your sexual activity has changed since you were last screened and you are at an increased risk for chlamydia or gonorrhea. Ask your health care provider if you are at risk.  If you do not have HIV, but are at risk, it may be recommended that you take a prescription medicine daily to prevent HIV infection. This is called pre-exposure prophylaxis (PrEP). You are considered at risk if:  You are sexually active and do not regularly use condoms or know the HIV status of your partner(s).  You take drugs by injection.  You are sexually active with a partner who has HIV. Talk with your health care provider about whether you are at high risk of being infected with HIV. If you choose to begin PrEP, you should first be tested for HIV. You should then be tested every 3 months for as long as you are taking PrEP. Pregnancy  If you are premenopausal and you may become pregnant, ask your health care provider about preconception counseling.  If you may become pregnant, take 400 to 800 micrograms (mcg) of folic acid every day.  If you want to prevent pregnancy, talk to your health care provider about birth control (contraception). Osteoporosis and menopause  Osteoporosis is a disease in which the bones lose minerals and strength with aging. This can result in serious bone fractures. Your risk for osteoporosis can be  identified using a bone density scan.  If you are 38 years of age or older, or if you are at risk for osteoporosis and fractures, ask your health care provider if you should be screened.  Ask your health care provider whether you should take a calcium or vitamin D supplement to lower your risk for osteoporosis.  Menopause may have certain physical symptoms and risks.  Hormone replacement therapy may reduce some of these symptoms and risks. Talk to your health care provider about whether hormone replacement therapy is right for you. Follow these instructions at home:  Schedule regular health, dental, and eye exams.  Stay current with your immunizations.  Do not use any tobacco products including cigarettes, chewing tobacco, or electronic cigarettes.  If you are pregnant, do not drink alcohol.  If you are breastfeeding, limit how much and how often you drink alcohol.  Limit alcohol intake to no more than 1 drink per day for nonpregnant women. One drink equals 12 ounces of beer, 5 ounces of wine, or 1 ounces of hard liquor.  Do not use street drugs.  Do not share needles.  Ask your health care provider for help if you need support or information about quitting drugs.  Tell your health care provider if you often feel depressed.  Tell your health care provider if you have ever been abused or do not feel safe at home. This information is not intended to replace advice given to you by your health care provider. Make sure you discuss any questions you have with your health care provider. Document Released: 02/10/2011 Document Revised: 01/03/2016 Document Reviewed: 05/01/2015 Elsevier Interactive Patient Education  2017 [employer].

## 2016-10-30 NOTE — Assessment & Plan Note (Signed)
GERD controlled Continue daily medication  

## 2016-10-30 NOTE — Assessment & Plan Note (Signed)
Will start B12 1000 mcg daily

## 2016-10-30 NOTE — Assessment & Plan Note (Signed)
Not fasting today, lipid panel checked within the year and was well controlled Continue statin

## 2016-10-30 NOTE — Progress Notes (Signed)
Pre visit review using our clinic review tool, if applicable. No additional management support is needed unless otherwise documented below in the visit note. 

## 2016-10-30 NOTE — Assessment & Plan Note (Addendum)
Symptomatic in fall mostly or exposure to dust Uses albuterol rarely Controlled, continue same

## 2016-10-30 NOTE — Assessment & Plan Note (Signed)
Will start 2000 units of vitamin d daily

## 2016-10-30 NOTE — Progress Notes (Signed)
Subjective:    Patient ID: Morgan Espinoza, female    DOB: 1950/12/27, 66 y.o.   MRN: [id number]  HPI Here for medicare wellness exam and physical exam.   I have personally reviewed and have noted 1.The patient's medical and social history 2.Their use of alcohol, tobacco or illicit drugs 3.Their current medications and supplements 4.The patient's functional ability including ADL's, fall risks, home safety risks and                 hearing or visual impairment. 5.Diet and physical activities 6.Evidence for depression or mood disorders 7.Care team reviewed: Neurology - Dr Krista Blue, Oncology - Dr Marin Olp, Eye - Dr Katy Fitch   Are there smokers in your home (other than you)? No  Risk Factors Exercise:      none Dietary issues discussed:   Limits fried foods, limits her sugars,   Cardiac risk factors: advanced age, hypertension, hyperlipidemia, and obesity (BMI >= 30 kg/m2).  Depression Screen  Have you felt down, depressed or hopeless? No  Have you felt little interest or pleasure in doing things?  No  Activities of Daily Living In your present state of health, do you have any difficulty performing the following activities?:  Driving? No Managing money?  No Feeding yourself? No Getting from bed to chair? No Climbing a flight of stairs? No Preparing food and eating?: No Bathing or showering? No Getting dressed: No Getting to/using the toilet? No Moving around from place to place: No In the past year have you fallen or had a near fall?: No   Are you sexually active?  No  Do you have more than one partner?  N/A  Hearing Difficulties: No Do you often ask people to speak up or repeat themselves? No Do you experience ringing or noises in your ears? No Do you have difficulty understanding soft or whispered voices? No Vision:              Any change in vision:   no             Up to date with eye exam: yes Memory:  Do you  feel that you have a problem with memory? Yes, slight issue - mild cognitive impairment per neurology  Do you often misplace items? sometimes  Do you feel safe at home?  Yes  Cognitive Testing  Alert, Orientated? Yes  Normal Appearance? Yes  Recall of three objects?  Yes  Can perform simple calculations? Yes  Displays appropriate judgment? Yes  Can read the correct time from a watch face? Yes   Advanced Directives have been discussed with the patient? Yes - has discussed with duaghter   Medications and allergies reviewed with patient and updated if appropriate.  Patient Active Problem List   Diagnosis Date Noted  . MCI (mild cognitive impairment) 10/23/2016  . Lytic bone lesions on xray 06/03/2016  . Primary osteoarthritis of both knees 10/12/2014  . B12 deficiency 10/12/2014  . 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 Hyperlipidemia   . Vertigo   . Vitamin D deficiency   . Obesity   . Achilles tendonitis   . Anemia, iron deficiency 07/23/2012  . 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 Rib lesion 04/28/2012  . Hypertension 06/05/2011  . Asthma 06/05/2011  . GERD (gastroesophageal reflux disease) 06/05/2011  . MS (multiple sclerosis) ([place]) 06/05/2011    Current Outpatient Prescriptions on File Prior to Visit  Medication Sig Dispense Refill  . acetaminophen (TYLENOL) 325 MG tablet Take 650  mg by mouth every 6 (six) hours as needed.    Marland Kitchen albuterol (PROVENTIL HFA;VENTOLIN HFA) 108 (90 Base) MCG/ACT inhaler Inhale 2 puffs into the lungs every 6 (six) hours as needed (cough, shortness of breath or wheezing.). 1 Inhaler 5  . amLODipine (NORVASC) 10 MG tablet Take 1 tablet (10 mg total) by mouth daily. 90 tablet 3  . aspirin 81 MG tablet Take 81 mg by mouth daily.    Marland Kitchen atorvastatin (LIPITOR) 10 MG tablet Take 1 tablet (10 mg total) by mouth daily. 90 tablet 3  . HYDROcodone-acetaminophen (NORCO/VICODIN) 5-325 MG tablet Take 1 tablet by mouth every 4 (four) hours as needed for moderate  pain.    . Interferon Beta-1b (BETASERON) 0.3 MG KIT injection Inject 0.25 mg into the skin every other day. 1 kit 11  . loratadine (CLARITIN) 10 MG tablet Take 10 mg by mouth daily as needed. Reported on 01/10/2016    . losartan (COZAAR) 50 MG tablet TAKE ONE TABLET BY MOUTH ONCE DAILY 90 tablet 1  . prednisoLONE acetate (PRED FORTE) 1 % ophthalmic suspension Place 1 drop into the left eye.    . ranitidine (ZANTAC) 150 MG tablet Take 1 tablet (150 mg total) by mouth 2 (two) times daily. (Patient taking differently: Take 150 mg by mouth every morning. ) 180 tablet 3   No current facility-administered medications on file prior to visit.     Past Medical History:  Diagnosis Date  . Achilles tendonitis   . Anemia, iron deficiency 07/23/2012  . Asthma   . Cancer ([place]) 05/2012   plasma cell myeloma  . GERD (gastroesophageal reflux disease)   . Hyperlipidemia   . Hypertension   . Multiple sclerosis ([place])   . OA (osteoarthritis) of knee   . Obesity   . 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 S/P radiation therapy 08/12/12 - 09/21/12   left Anterior 2nd Rib Lesion / 50.4 GY / 28 Fractions  . Vertigo   . Vitamin D deficiency     Past Surgical History:  Procedure Laterality Date  . ABDOMINAL HYSTERECTOMY     fibroids  . BONE BIOPSY  05/17/12   Left 2nd Rib, Plasma Cell Myeloma  . BREAST SURGERY     left breast biopsy-benign  . CHOLECYSTECTOMY     lap choley  . ROTATOR CUFF REPAIR      Social History   Social History  . Marital status: Widowed    Spouse name: N/A  . Number of children: 1  . Years of education: 44   Occupational History  .  Retired  . Retired     Social History Main Topics  . Smoking status: Former Smoker    Packs/day: 0.10    Years: 2.00    Types: Cigarettes    Start date: 10/05/1976    Quit date: 04/19/1979  . Smokeless tobacco: Never Used     Comment: quit 35 years ago   . Alcohol use No  . Drug use: No  . Sexual activity: No   Other  Topics Concern  . 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    Patient is retired.    Patient is widowed.    Patient is right handed.    Patient has a high school education.    Patient has 1 child.     Family History  Problem Relation  Age of Onset  . Dementia Mother   . Hypertension Mother   . Lung cancer Father     +tobacco  . Diabetes Sister   . Colon cancer Brother     youngest brother  . Diabetes Sister     sister # [phone]  . Diabetes Brother   . Stomach cancer Neg Hx   . Pancreatic cancer Neg Hx     Review of Systems  Constitutional: Negative for appetite change, chills and fever.  HENT: Negative for hearing loss and tinnitus.   Eyes: Negative for visual disturbance.  Respiratory: Negative for cough, shortness of breath and wheezing.   Cardiovascular: Positive for palpitations (rare, transient). Negative for chest pain and leg swelling.  Gastrointestinal: Negative for abdominal pain, blood in stool, constipation, diarrhea and nausea.       Gerd controlled with zantac  Genitourinary: Negative for dysuria and hematuria.  Musculoskeletal: Negative for arthralgias and back pain.  Skin: Negative for color change and rash.  Neurological: Negative for light-headedness and headaches.  Psychiatric/Behavioral: Negative for dysphoric mood. The patient is not nervous/anxious.        Objective:   Vitals:   10/30/16 1016  BP: 124/70  Pulse: 76  Temp: 97.8 F (36.6 C)   Filed Weights   10/30/16 1016  Weight: 212 lb 1.9 oz (96.2 kg)   Body mass index is 40.08 kg/m.  Wt Readings from Last 3 Encounters:  10/30/16 212 lb 1.9 oz (96.2 kg)  10/23/16 211 lb (95.7 kg)  10/05/16 210 lb (95.3 kg)     Physical Exam Constitutional: She appears well-developed and well-nourished. No distress.  HENT:  Head: Normocephalic and atraumatic.  Right Ear: External ear normal. Normal ear canal and TM Left Ear: External  ear normal.  Normal ear canal and TM Mouth/Throat: Oropharynx is clear and moist.  Eyes: Conjunctivae and EOM are normal.  Neck: Neck supple. No tracheal deviation present. No thyromegaly present.  No carotid bruit  Cardiovascular: Normal rate, regular rhythm and normal heart sounds.   No murmur heard.  No edema. Pulmonary/Chest: Effort normal and breath sounds normal. No respiratory distress. She has no wheezes. She has no rales.  Breast: deferred  Abdominal: Soft. She exhibits no distension. There is no tenderness.  Lymphadenopathy: She has no cervical adenopathy.  Skin: Skin is warm and dry. She is not diaphoretic.  Psychiatric: She has a normal mood and affect. Her behavior is normal.        Assessment & Plan:   Wellness Exam: Immunizations  Up to date  Colonoscopy  Up to date  Mammogram  Up to date  80   - no longer goes Dexa  - ordered Eye exams  Up to date  Hearing loss  none Memory concerns/difficulties  Mild, has been diagnosed with MCI by neuro Independent of ADLs  fully Stressed the importance of regular exercise   Patient received copy of preventative screening tests/immunizations recommended for the next 5-10 years.  Physical exam: Screening blood work - had some recently - reivewe Immunizations  Up to date  Colonoscopy  Up to date  Mammogram  Up to date  35   - no longer goes Dexa  - ordered Eye exams  Up to date  EKG  Today --- Sinus  Bradycardia, short PR, T wave abnormality anterior-laterally - no change compared with 03/05/13 Exercise  None - stressed the importance of regular exercise Weight  Advised weight loss Skin  No concerns Substance abuse  none  See Problem List for Assessment and Plan of chronic medical problems.  FU in 6 months

## 2016-11-03 ENCOUNTER — Telehealth: Payer: Self-pay | Admitting: *Deleted

## 2016-11-03 ENCOUNTER — Ambulatory Visit (HOSPITAL_BASED_OUTPATIENT_CLINIC_OR_DEPARTMENT_OTHER): Payer: Medicare Other | Admitting: Family

## 2016-11-03 ENCOUNTER — Ambulatory Visit (HOSPITAL_BASED_OUTPATIENT_CLINIC_OR_DEPARTMENT_OTHER): Payer: Medicare Other

## 2016-11-03 ENCOUNTER — Other Ambulatory Visit (HOSPITAL_BASED_OUTPATIENT_CLINIC_OR_DEPARTMENT_OTHER): Payer: Medicare Other

## 2016-11-03 ENCOUNTER — Telehealth: Payer: Self-pay | Admitting: Neurology

## 2016-11-03 VITALS — BP 148/70 | HR 83 | Temp 98.1°F | Resp 18 | Wt 212.1 lb

## 2016-11-03 DIAGNOSIS — C9000 Multiple myeloma not having achieved remission: Secondary | ICD-10-CM

## 2016-11-03 DIAGNOSIS — D509 Iron deficiency anemia, unspecified: Secondary | ICD-10-CM | POA: Diagnosis not present

## 2016-11-03 DIAGNOSIS — C9001 Multiple myeloma in remission: Secondary | ICD-10-CM

## 2016-11-03 DIAGNOSIS — D508 Other iron deficiency anemias: Secondary | ICD-10-CM

## 2016-11-03 DIAGNOSIS — C9030 Solitary plasmacytoma not having achieved remission: Secondary | ICD-10-CM

## 2016-11-03 DIAGNOSIS — M899 Disorder of bone, unspecified: Secondary | ICD-10-CM

## 2016-11-03 LAB — CBC WITH DIFFERENTIAL (CANCER CENTER ONLY)
BASO#: 0 10*3/uL (ref 0.0–0.2)
BASO%: 0.3 % (ref 0.0–2.0)
EOS%: 10.4 % — AB (ref 0.0–7.0)
Eosinophils Absolute: 0.7 10*3/uL — ABNORMAL HIGH (ref 0.0–0.5)
HCT: 35.7 % (ref 34.8–46.6)
HGB: 11.9 g/dL (ref 11.6–15.9)
LYMPH#: 1.4 10*3/uL (ref 0.9–3.3)
LYMPH%: 19.9 % (ref 14.0–48.0)
MCH: 31.4 pg (ref 26.0–34.0)
MCHC: 33.3 g/dL (ref 32.0–36.0)
MCV: 94 fL (ref 81–101)
MONO#: 0.5 10*3/uL (ref 0.1–0.9)
MONO%: 7.2 % (ref 0.0–13.0)
NEUT#: 4.3 10*3/uL (ref 1.5–6.5)
NEUT%: 62.2 % (ref 39.6–80.0)
PLATELETS: 58 10*3/uL — AB (ref 145–400)
RBC: 3.79 10*6/uL (ref 3.70–5.32)
RDW: 12.5 % (ref 11.1–15.7)
WBC: 6.8 10*3/uL (ref 3.9–10.0)

## 2016-11-03 LAB — CMP (CANCER CENTER ONLY)
ALK PHOS: 73 U/L (ref 26–84)
ALT: 29 U/L (ref 10–47)
AST: 36 U/L (ref 11–38)
Albumin: 3.4 g/dL (ref 3.3–5.5)
BILIRUBIN TOTAL: 0.7 mg/dL (ref 0.20–1.60)
BUN: 19 mg/dL (ref 7–22)
CO2: 29 mEq/L (ref 18–33)
CREATININE: 0.7 mg/dL (ref 0.6–1.2)
Calcium: 9.2 mg/dL (ref 8.0–10.3)
Chloride: 107 mEq/L (ref 98–108)
GLUCOSE: 92 mg/dL (ref 73–118)
Potassium: 3.4 mEq/L (ref 3.3–4.7)
SODIUM: 144 meq/L (ref 128–145)
TOTAL PROTEIN: 7 g/dL (ref 6.4–8.1)

## 2016-11-03 LAB — IRON AND TIBC
%SAT: 27 % (ref 21–57)
Iron: 71 ug/dL (ref 41–142)
TIBC: 261 ug/dL (ref 236–444)
UIBC: 190 ug/dL (ref 120–384)

## 2016-11-03 LAB — FERRITIN: FERRITIN: 745 ng/mL — AB (ref 9–269)

## 2016-11-03 MED ORDER — ZOLEDRONIC ACID 4 MG/100ML IV SOLN
4.0000 mg | Freq: Once | INTRAVENOUS | Status: AC
  Administered 2016-11-03: 4 mg via INTRAVENOUS
  Filled 2016-11-03: qty 100

## 2016-11-03 NOTE — Telephone Encounter (Addendum)
Chart reviewed, she had a history of relapsing remitting multiple sclerosis, has been on chronic Betaseron treatment, last MRI of brain with and without contrast in July 2017 showed no significant change, there was no reported MS flareup  Get email from her hematologist concerning low platelet count, but the most recent number was within normal limits      Ref Range & Units 11d ago (10/23/16) 70mo ago (09/22/16) 23mo ago (05/05/16) 66mo ago (01/10/16) 8yr ago (11/01/15) 28yr ago (08/02/15) 55yr ago (05/03/15)   WBC 3.4 - 10.8 x10E3/uL 6.5  6.6R  6.4R  6.7R  5.8R  4.9R  4.6R    RBC 3.77 - 5.28 x10E6/uL 3.80  4.09R  3.76R  3.82R  3.95R  3.87R  3.62R     Hemoglobin 11.1 - 15.9 g/dL 11.6  12.6R  11.8R  11.8R  12.2R  11.9R  11.4R     Hematocrit 34.0 - 46.6 % 34.8  37.8R  35.7R  35.1R  37.3R  37.7R  35.2R    MCV 79 - 97 fL 92  92.5R  95R  91.9R  94R  97R  97R    MCH 26.6 - 33.0 pg 30.5   31.4R  30.9R  30.9R  30.7R  31.5R    MCHC 31.5 - 35.7 g/dL 33.3  33.3R  33.1R  33.6R  32.7R  31.6R   32.4R    RDW 12.3 - 15.4 % 13.2  12.8R  12.3R  13.5R  12.3R  12.1R  12.2R    Platelets 150 - 379 x10E3/uL 207  234.0R  157R  147R  172R  175R  Clumped Platele.Marland KitchenMarland KitchenR

## 2016-11-03 NOTE — Telephone Encounter (Addendum)
Patient aware of results  ----- Message from Eliezer Bottom, NP sent at 11/03/2016  2:10 PM EDT ----- Regarding: Iron  Iron studies look good. No infusion needed. Thank you!  Sarah  ----- Message ----- From: Interface, Lab In Three Zero One Sent: 11/03/2016  11:19 AM To: Eliezer Bottom, NP

## 2016-11-03 NOTE — Progress Notes (Signed)
Hematology and Oncology Follow Up Visit  Morgan Espinoza [id number] [birth year] 66 y.o. 11/03/2016   Principle Diagnosis:  IgG Kappa plasmacytoma Iron deficiency anemia  Current Therapy:   Zometa 4 mg IV q. 6 months IV iron as indicated - last received in June 2015     Interim History: Morgan Espinoza is here today for a follow-up. She continues to do well and has no complaints at this time. She states that she is staying busy working in her yard and getting ready for spring. 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  In September, her M-spike was not detected. IgG level was 1,116 mg/dl and kappa light chain was 2.45 mg/dL. These labs were rechecked today and results are pending. No anemia. WBC count is 6.8. Platelet count is down at 58. She has started treatment with Interferon Beta-1b for MS. We will continue to monitor this closely and I have sent her lab work from today to prescribing Neurologist Dr. Marcial Pacas for review.  She is asymptomatic with the platelet count. No episodes of bleeding, bruising or petechiae.  She has had no problem with infections. No c/o fatigue, fever, chills, n/v, cough, rash, dizziness, headaches, chest pain, palpitations, abdominal pain or changes in bowel or bladder habits.  No lymphadenopathy found on exam.  She has occasional SOB due to asthma if she over exerts herself. This resolves with taking time to rest and she uses her inhaler as needed.  No swelling, tenderness, numbness or tingling in her extremities. No c/o joint aches or pains.  She has maintained a good appetite and is staying well hydrated. Her weight is stable.   Medications:  Allergies as of 11/03/2016      Reactions   Celebrex [celecoxib]    Dristan Other (See Comments)   "made my chest tight"   Robitussin (alcohol Free) [guaifenesin] Other (See Comments)   "made my chest tight"   Ropinirole Hcl Other (See Comments)   "made my chest tight"   Tramadol Other (See Comments)   "made my chest tight"      Medication List       Accurate as of 11/03/16 10:59 AM. Always use your most recent med list.          acetaminophen 325 MG tablet Commonly known as:  TYLENOL Take 650 mg by mouth every 6 (six) hours as needed.   albuterol 108 (90 Base) MCG/ACT inhaler Commonly known as:  PROVENTIL HFA;VENTOLIN HFA Inhale 2 puffs into the lungs every 6 (six) hours as needed (cough, shortness of breath or wheezing.).   amLODipine 10 MG tablet Commonly known as:  NORVASC Take 1 tablet (10 mg total) by mouth daily.   aspirin 81 MG tablet Take 81 mg by mouth daily.   atorvastatin 10 MG tablet Commonly known as:  LIPITOR Take 1 tablet (10 mg total) by mouth daily.   HYDROcodone-acetaminophen 5-325 MG tablet Commonly known as:  NORCO/VICODIN Take 1 tablet by mouth every 4 (four) hours as needed for moderate pain.   Interferon Beta-1b 0.3 MG Kit injection Commonly known as:  BETASERON Inject 0.25 mg into the skin every other day.   loratadine 10 MG tablet Commonly known as:  CLARITIN Take 10 mg by mouth daily as needed. Reported on 01/10/2016   losartan 50 MG tablet Commonly known as:  COZAAR TAKE ONE TABLET BY MOUTH ONCE DAILY   prednisoLONE acetate 1 % ophthalmic suspension Commonly known as:  PRED FORTE Place 1 drop into the left eye.   ranitidine 150 MG tablet Commonly known as:  ZANTAC Take 1 tablet (150 mg total) by mouth 2 (two) times daily.   vitamin B-12 1000 MCG tablet Commonly known as:  CYANOCOBALAMIN Take 1,000 mcg by mouth daily.       Allergies:  Allergies  Allergen Reactions  . Celebrex [Celecoxib]   . Dristan Other (See Comments)    "made my chest tight"  . Robitussin (Alcohol Free) [Guaifenesin] Other (See Comments)    "made my chest tight"  . Ropinirole Hcl Other (See Comments)    "made my chest tight"  . Tramadol Other (See Comments)    "made my chest tight"    Past Medical History,  Surgical history, Social history, and Family History were reviewed and updated.  Review of Systems: All other 10 point review of systems is negative.   Physical Exam:  vitals were not taken for this visit.  Wt Readings from Last 3 Encounters:  10/30/16 212 lb 1.9 oz (96.2 kg)  10/23/16 211 lb (95.7 kg)  10/05/16 210 lb (95.3 kg)    Ocular: Sclerae unicteric, pupils equal, round and reactive to light Ear-nose-throat: Oropharynx clear, dentition fair Lymphatic: No cervical supraclavicular or axillary adenopathy Lungs no rales or rhonchi, good excursion bilaterally Heart regular rate and rhythm, no murmur appreciated Abd soft, nontender, positive bowel sounds, no liver or spleen tip palpated during exam, no fluid wave  MSK no focal spinal tenderness, no joint edema Neuro: non-focal, well-oriented, appropriate affect Breasts: Deferred  Lab Results  Component Value Date   WBC 6.5 10/23/2016   HGB 12.6 09/22/2016   HCT 34.8 10/23/2016   MCV 92 10/23/2016   PLT 207 10/23/2016   Lab Results  Component Value Date   FERRITIN 883 (H) 01/10/2016   IRON 58 01/10/2016   TIBC 242 (L) 01/10/2016   UIBC 184 01/10/2016   IRONPCTSAT 24 01/10/2016   Lab Results  Component Value Date   RETICCTPCT 0.9 10/05/2013   RBC 3.80 10/23/2016   RETICCTABS 36.5 10/05/2013   Lab Results  Component Value Date   KPAFRELGTCHN 3.18 (H) 08/02/2015   LAMBDASER 2.24 08/02/2015   KAPLAMBRATIO 1.21 05/05/2016   Lab Results  Component Value Date   IGGSERUM 1,116 05/05/2016   IGA 204 08/02/2015   IGMSERUM 63 05/05/2016   Lab Results  Component Value Date   TOTALPROTELP 6.8 08/02/2015   ALBUMINELP 3.7 (L) 08/02/2015   A1GS 0.4 (H) 08/02/2015   A2GS 0.8 08/02/2015   BETS 0.4 08/02/2015   BETA2SER 0.4 08/02/2015   GAMS 1.2 08/02/2015   MSPIKE Not Observed 11/01/2015   SPEI * 08/02/2015     Chemistry      Component Value Date/Time   NA 145 (H) 10/23/2016 0945   NA 140 05/05/2016 1017   NA  144 11/01/2015 0927   K 3.9 10/23/2016 0945   K 3.7 05/05/2016 1017   K 3.8 11/01/2015 0927   CL 103 10/23/2016 0945   CL 109 (H) 05/05/2016 1017   CO2 24 10/23/2016 0945   CO2 28 05/05/2016 1017   CO2 27 11/01/2015 0927   BUN 15 10/23/2016 0945   BUN 19 05/05/2016 1017   BUN 15.5 11/01/2015 0927   CREATININE 0.74 10/23/2016 0945   CREATININE 0.9 05/05/2016 1017   CREATININE 0.8 11/01/2015 0927      Component Value Date/Time   CALCIUM 9.1 10/23/2016 0945   CALCIUM 8.9 05/05/2016 1017     CALCIUM 9.2 11/01/2015 0927   ALKPHOS 88 10/23/2016 0945   ALKPHOS 78 05/05/2016 1017   ALKPHOS 74 11/01/2015 0927   Espinoza 30 10/23/2016 0945   Espinoza 42 (H) 05/05/2016 1017   Espinoza 26 11/01/2015 0927   ALT 23 10/23/2016 0945   ALT 36 05/05/2016 1017   ALT 21 11/01/2015 0927   BILITOT 0.3 10/23/2016 0945   BILITOT 0.70 05/05/2016 1017   BILITOT 0.48 11/01/2015 0927     Impression and Plan: Ms. Lillibridge is 65 yo female with a plasmacytoma. She had radiation to the left ribs in February of 2014. She continues to do well and is asymptomatic at this time. She had no M-spike detected with her labs in September. IgG level was 1,116 mg/dL. Today's results are pending.  No anemia at this time. Platelet count is down at 58, no bleeding or bruising. She has started Interferon Beta-1b for MS which may be related. We will have her come back in 2 weeks for repeat lab work. We discussed the s/s symptoms of a low platelet count and she will go to the ED with symptoms of anemia or uncontrolled bleeding.  We will proceed with Zometa today per Dr. Ennever and continue her every 6 month schedule.  We will see her back in 6 months for follow-up and infusion.   She will contact us with any questions or concerns. We can certainly see her sooner if need be.   , M, NP 3/26/201810:59 AM   

## 2016-11-03 NOTE — Patient Instructions (Signed)

## 2016-11-04 LAB — MULTIPLE MYELOMA PANEL, SERUM
ALBUMIN/GLOB SERPL: 1.1 (ref 0.7–1.7)
ALPHA 1: 0.2 g/dL (ref 0.0–0.4)
ALPHA2 GLOB SERPL ELPH-MCNC: 0.8 g/dL (ref 0.4–1.0)
Albumin SerPl Elph-Mcnc: 3.5 g/dL (ref 2.9–4.4)
B-Globulin SerPl Elph-Mcnc: 1.1 g/dL (ref 0.7–1.3)
GAMMA GLOB SERPL ELPH-MCNC: 1.2 g/dL (ref 0.4–1.8)
GLOBULIN, TOTAL: 3.3 g/dL (ref 2.2–3.9)
IGA/IMMUNOGLOBULIN A, SERUM: 184 mg/dL (ref 87–352)
IgM, Qn, Serum: 66 mg/dL (ref 26–217)
Total Protein: 6.8 g/dL (ref 6.0–8.5)

## 2016-11-04 LAB — KAPPA/LAMBDA LIGHT CHAINS
IG KAPPA FREE LIGHT CHAIN: 24.8 mg/L — AB (ref 3.3–19.4)
Ig Lambda Free Light Chain: 21.7 mg/L (ref 5.7–26.3)
Kappa/Lambda FluidC Ratio: 1.14 (ref 0.26–1.65)

## 2016-11-13 ENCOUNTER — Ambulatory Visit
Admission: RE | Admit: 2016-11-13 | Discharge: 2016-11-13 | Disposition: A | Discharge status: Home / Self Care | Payer: Medicare Other | Source: Ambulatory Visit | Attending: Internal Medicine | Admitting: Internal Medicine

## 2016-11-13 DIAGNOSIS — Z1231 Encounter for screening mammogram for malignant neoplasm of breast: Secondary | ICD-10-CM

## 2016-11-17 ENCOUNTER — Other Ambulatory Visit: Payer: Medicare Other

## 2016-11-17 DIAGNOSIS — C9030 Solitary plasmacytoma not having achieved remission: Secondary | ICD-10-CM | POA: Diagnosis not present

## 2016-11-17 DIAGNOSIS — D508 Other iron deficiency anemias: Secondary | ICD-10-CM

## 2016-11-17 LAB — CBC WITH DIFFERENTIAL (CANCER CENTER ONLY)
BASO#: 0 10*3/uL (ref 0.0–0.2)
BASO%: 0.3 % (ref 0.0–2.0)
EOS ABS: 0.6 10*3/uL — AB (ref 0.0–0.5)
EOS%: 10 % — ABNORMAL HIGH (ref 0.0–7.0)
HCT: 37.2 % (ref 34.8–46.6)
HGB: 12.1 g/dL (ref 11.6–15.9)
LYMPH#: 1.5 10*3/uL (ref 0.9–3.3)
LYMPH%: 23.1 % (ref 14.0–48.0)
MCH: 31 pg (ref 26.0–34.0)
MCHC: 32.5 g/dL (ref 32.0–36.0)
MCV: 95 fL (ref 81–101)
MONO#: 0.4 10*3/uL (ref 0.1–0.9)
MONO%: 6.8 % (ref 0.0–13.0)
NEUT#: 3.8 10*3/uL (ref 1.5–6.5)
NEUT%: 59.8 % (ref 39.6–80.0)
PLATELETS: 199 10*3/uL (ref 145–400)
RBC: 3.9 10*6/uL (ref 3.70–5.32)
RDW: 12.4 % (ref 11.1–15.7)
WBC: 6.3 10*3/uL (ref 3.9–10.0)

## 2016-11-17 LAB — COMPREHENSIVE METABOLIC PANEL
ALT: 26 U/L (ref 0–55)
AST: 30 U/L (ref 5–34)
Albumin: 3.6 g/dL (ref 3.5–5.0)
Alkaline Phosphatase: 81 U/L (ref 40–150)
Anion Gap: 11 mEq/L (ref 3–11)
BUN: 19.7 mg/dL (ref 7.0–26.0)
CALCIUM: 9.1 mg/dL (ref 8.4–10.4)
CHLORIDE: 108 meq/L (ref 98–109)
CO2: 26 meq/L (ref 22–29)
Creatinine: 0.8 mg/dL (ref 0.6–1.1)
EGFR: 85 mL/min/{1.73_m2} — ABNORMAL LOW (ref >90)
GLUCOSE: 87 mg/dL (ref 70–140)
POTASSIUM: 3.8 meq/L (ref 3.5–5.1)
SODIUM: 144 meq/L (ref 136–145)
Total Bilirubin: 0.38 mg/dL (ref 0.20–1.20)
Total Protein: 7.2 g/dL (ref 6.4–8.3)

## 2016-11-20 ENCOUNTER — Inpatient Hospital Stay: Admission: RE | Admit: 2016-11-20 | Payer: Medicare Other | Source: Ambulatory Visit

## 2017-01-01 ENCOUNTER — Other Ambulatory Visit: Payer: Self-pay | Admitting: Emergency Medicine

## 2017-03-01 ENCOUNTER — Other Ambulatory Visit: Payer: Self-pay | Admitting: Emergency Medicine

## 2017-03-02 ENCOUNTER — Other Ambulatory Visit: Payer: Self-pay | Admitting: Emergency Medicine

## 2017-03-10 ENCOUNTER — Other Ambulatory Visit: Payer: Self-pay | Admitting: Emergency Medicine

## 2017-03-10 MED ORDER — AMLODIPINE BESYLATE 10 MG PO TABS: 10.0000 mg | ORAL_TABLET | Freq: Every day | ORAL | 3 refills | Status: DC

## 2017-03-28 ENCOUNTER — Other Ambulatory Visit: Payer: Self-pay | Admitting: Emergency Medicine

## 2017-04-20 ENCOUNTER — Other Ambulatory Visit: Payer: Self-pay | Admitting: Emergency Medicine

## 2017-04-23 NOTE — Telephone Encounter (Signed)
Dr. Quay Burow is PCP; will forward refill requests.

## 2017-04-27 ENCOUNTER — Ambulatory Visit: Payer: Medicare Other | Admitting: Nurse Practitioner

## 2017-05-06 ENCOUNTER — Ambulatory Visit: Payer: Medicare Other | Admitting: Internal Medicine

## 2017-05-07 ENCOUNTER — Ambulatory Visit (HOSPITAL_BASED_OUTPATIENT_CLINIC_OR_DEPARTMENT_OTHER): Payer: Medicare Other

## 2017-05-07 ENCOUNTER — Other Ambulatory Visit (HOSPITAL_BASED_OUTPATIENT_CLINIC_OR_DEPARTMENT_OTHER): Payer: Medicare Other

## 2017-05-07 ENCOUNTER — Ambulatory Visit (HOSPITAL_BASED_OUTPATIENT_CLINIC_OR_DEPARTMENT_OTHER): Payer: Medicare Other | Admitting: Hematology & Oncology

## 2017-05-07 VITALS — BP 153/80 | HR 64 | Temp 98.1°F | Resp 18 | Wt 202.0 lb

## 2017-05-07 DIAGNOSIS — C9000 Multiple myeloma not having achieved remission: Secondary | ICD-10-CM

## 2017-05-07 DIAGNOSIS — D509 Iron deficiency anemia, unspecified: Secondary | ICD-10-CM

## 2017-05-07 DIAGNOSIS — C9030 Solitary plasmacytoma not having achieved remission: Secondary | ICD-10-CM

## 2017-05-07 DIAGNOSIS — D508 Other iron deficiency anemias: Secondary | ICD-10-CM

## 2017-05-07 DIAGNOSIS — M899 Disorder of bone, unspecified: Secondary | ICD-10-CM

## 2017-05-07 LAB — CBC WITH DIFFERENTIAL (CANCER CENTER ONLY)
BASO#: 0 10*3/uL (ref 0.0–0.2)
BASO%: 0.2 % (ref 0.0–2.0)
EOS%: 9.7 % — AB (ref 0.0–7.0)
Eosinophils Absolute: 0.5 10*3/uL (ref 0.0–0.5)
HEMATOCRIT: 34.9 % (ref 34.8–46.6)
HGB: 11.5 g/dL — ABNORMAL LOW (ref 11.6–15.9)
LYMPH#: 1.2 10*3/uL (ref 0.9–3.3)
LYMPH%: 25.1 % (ref 14.0–48.0)
MCH: 31.6 pg (ref 26.0–34.0)
MCHC: 33 g/dL (ref 32.0–36.0)
MCV: 96 fL (ref 81–101)
MONO#: 0.5 10*3/uL (ref 0.1–0.9)
MONO%: 10.3 % (ref 0.0–13.0)
NEUT#: 2.7 10*3/uL (ref 1.5–6.5)
NEUT%: 54.7 % (ref 39.6–80.0)
Platelets: 194 10*3/uL (ref 145–400)
RBC: 3.64 10*6/uL — ABNORMAL LOW (ref 3.70–5.32)
RDW: 12.2 % (ref 11.1–15.7)
WBC: 5 10*3/uL (ref 3.9–10.0)

## 2017-05-07 LAB — IRON AND TIBC
%SAT: 25 % (ref 21–57)
Iron: 64 ug/dL (ref 41–142)
TIBC: 253 ug/dL (ref 236–444)
UIBC: 188 ug/dL (ref 120–384)

## 2017-05-07 LAB — CMP (CANCER CENTER ONLY)
ALT(SGPT): 32 U/L (ref 10–47)
AST: 40 U/L — ABNORMAL HIGH (ref 11–38)
Albumin: 3.5 g/dL (ref 3.3–5.5)
Alkaline Phosphatase: 73 U/L (ref 26–84)
BUN, Bld: 13 mg/dL (ref 7–22)
CALCIUM: 8.9 mg/dL (ref 8.0–10.3)
CO2: 29 meq/L (ref 18–33)
Chloride: 108 mEq/L (ref 98–108)
Creat: 1.3 mg/dl — ABNORMAL HIGH (ref 0.6–1.2)
GLUCOSE: 95 mg/dL (ref 73–118)
POTASSIUM: 3.9 meq/L (ref 3.3–4.7)
Sodium: 146 mEq/L — ABNORMAL HIGH (ref 128–145)
Total Bilirubin: 0.6 mg/dl (ref 0.20–1.60)
Total Protein: 7.1 g/dL (ref 6.4–8.1)

## 2017-05-07 LAB — FERRITIN: Ferritin: 746 ng/ml — ABNORMAL HIGH (ref 9–269)

## 2017-05-07 MED ORDER — ZOLEDRONIC ACID 4 MG/100ML IV SOLN
4.0000 mg | Freq: Once | INTRAVENOUS | Status: AC
  Administered 2017-05-07: 4 mg via INTRAVENOUS
  Filled 2017-05-07: qty 100

## 2017-05-07 NOTE — Progress Notes (Signed)
Subjective:    Patient ID: Morgan Espinoza, female    DOB: 10-27-1950, 66 y.o.   MRN: [id number]  HPI The patient is here for follow up.  Hyperlipidemia: She is taking her medication daily. She is compliant with a low fat/cholesterol diet. She is not exercising regularly. She denies myalgias.   Hypertension: She is taking her medication daily. She is compliant with a low sodium diet.  She denies chest pain, palpitations, edema, shortness of breath and regular headaches. She is not exercising regularly.  She does not monitor her blood pressure at home.    GERD:  She is taking her medication daily as prescribed.  She denies any GERD symptoms and feels her GERD is well controlled.   Asthma:  She denies cough, wheeze and SOB.  She uses the albuterol as needed.  She has not used the inhaler in a while.  Allergies and URI's casuses her asthma to flare.     Medications and allergies reviewed with patient and updated if appropriate.  Patient Active Problem List   Diagnosis Date Noted  . MCI (mild cognitive impairment) 10/23/2016  . Lytic bone lesions on xray 06/03/2016  . Primary osteoarthritis of both knees 10/12/2014  . B12 deficiency 10/12/2014  . Plasmacytoma ([place]) 07/27/2012  . Hyperlipidemia   . Vertigo   . Vitamin D deficiency   . Obesity   . Anemia, iron deficiency 07/23/2012  . 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 Rib lesion 04/28/2012  . Hypertension 06/05/2011  . Asthma 06/05/2011  . GERD (gastroesophageal reflux disease) 06/05/2011  . MS (multiple sclerosis) ([place]) 06/05/2011    Current Outpatient Prescriptions on File Prior to Visit  Medication Sig Dispense Refill  . acetaminophen (TYLENOL) 325 MG tablet Take 650 mg by mouth every 6 (six) hours as needed.    Marland Kitchen albuterol (PROVENTIL HFA;VENTOLIN HFA) 108 (90 Base) MCG/ACT inhaler Inhale 2 puffs into the lungs every 6 (six) hours as needed (cough, shortness of breath or wheezing.). 1 Inhaler 5  . amLODipine (NORVASC) 10 MG  tablet Take 1 tablet (10 mg total) by mouth daily. 90 tablet 3  . aspirin 81 MG tablet Take 81 mg by mouth daily.    Marland Kitchen atorvastatin (LIPITOR) 10 MG tablet TAKE ONE TABLET BY MOUTH ONCE DAILY 90 tablet 3  . HYDROcodone-acetaminophen (NORCO/VICODIN) 5-325 MG tablet Take 1 tablet by mouth every 4 (four) hours as needed for moderate pain.    . Interferon Beta-1b (BETASERON) 0.3 MG KIT injection Inject 0.25 mg into the skin every other day. 1 kit 11  . loratadine (CLARITIN) 10 MG tablet Take 10 mg by mouth daily as needed. Reported on 01/10/2016    . losartan (COZAAR) 50 MG tablet TAKE ONE TABLET BY MOUTH ONCE DAILY 90 tablet 1  . prednisoLONE acetate (PRED FORTE) 1 % ophthalmic suspension Place 1 drop into the left eye.    . ranitidine (ZANTAC) 150 MG tablet TAKE ONE TABLET BY MOUTH TWICE DAILY 180 tablet 3  . vitamin B-12 (CYANOCOBALAMIN) 1000 MCG tablet Take 1,000 mcg by mouth daily.     No current facility-administered medications on file prior to visit.     Past Medical History:  Diagnosis Date  . Achilles tendonitis   . Anemia, iron deficiency 07/23/2012  . Asthma   . Cancer ([place]) 05/2012   plasma cell myeloma  . GERD (gastroesophageal reflux disease)   . Hyperlipidemia   . Hypertension   . Multiple sclerosis ([place])   . OA (osteoarthritis)  of knee   . Obesity   . 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 S/P radiation therapy 08/12/12 - 09/21/12   left Anterior 2nd Rib Lesion / 50.4 GY / 28 Fractions  . Vertigo   . Vitamin D deficiency     Past Surgical History:  Procedure Laterality Date  . ABDOMINAL HYSTERECTOMY     fibroids  . BONE BIOPSY  05/17/12   Left 2nd Rib, Plasma Cell Myeloma  . BREAST SURGERY     left breast biopsy-benign  . CHOLECYSTECTOMY     lap choley  . ROTATOR CUFF REPAIR      Social History   Social History  . Marital status: Widowed    Spouse name: N/A  . Number of children: 1  . Years of education: 61   Occupational History  .  Retired   . Retired     Social History Main Topics  . Smoking status: Former Smoker    Packs/day: 0.10    Years: 2.00    Types: Cigarettes    Start date: 10/05/1976    Quit date: 04/19/1979  . Smokeless tobacco: Never Used     Comment: quit 35 years ago   . Alcohol use No  . Drug use: No  . Sexual activity: No   Other Topics Concern  . 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    Patient is retired.    Patient is widowed.    Patient is right handed.    Patient has a high school education.    Patient has 1 child.     Family History  Problem Relation Age of Onset  . Dementia Mother   . Hypertension Mother   . Lung cancer Father        +tobacco  . Diabetes Sister   . Colon cancer Brother        youngest brother  . Diabetes Sister        sister # 15  . Diabetes Brother   . Stomach cancer Neg Hx   . Pancreatic cancer Neg Hx     Review of Systems  Constitutional: Negative for chills and fever.  Respiratory: Positive for wheezing (occ). Negative for cough and shortness of breath.   Cardiovascular: Negative for chest pain, palpitations and leg swelling.  Gastrointestinal: Negative for abdominal pain.  Neurological: Negative for light-headedness and headaches.       Objective:   Vitals:   05/08/17 1029  BP: (!) 154/78  Pulse: 80  Resp: 16  Temp: 98.5 F (36.9 C)  SpO2: 96%   Wt Readings from Last 3 Encounters:  05/08/17 202 lb (91.6 kg)  05/07/17 202 lb (91.6 kg)  11/03/16 212 lb 1.9 oz (96.2 kg)   Body mass index is 38.17 kg/m.   BP Readings from Last 3 Encounters:  05/08/17 (!) 154/78  05/07/17 (!) 153/80  11/03/16 (!) 148/70      Physical Exam    Constitutional: Appears well-developed and well-nourished. No distress.  HENT:  Head: Normocephalic and atraumatic.  Neck: Neck supple. No tracheal deviation present. No thyromegaly present.  No cervical lymphadenopathy Cardiovascular: Normal  rate, regular rhythm and normal heart sounds.   No murmur heard. No carotid bruit .  No edema Pulmonary/Chest: Effort normal and breath sounds normal. No respiratory distress. No has no wheezes. No rales.  Skin: Skin is warm and  dry. Not diaphoretic.  Psychiatric: Normal mood and affect. Behavior is normal.     Assessment & Plan:    See Problem List for Assessment and Plan of chronic medical problems.

## 2017-05-07 NOTE — Progress Notes (Signed)
Hematology and Oncology Follow Up Visit  Morgan Espinoza [id number] May 13, 1951 66 y.o. 05/07/2017   Principle Diagnosis:  IgG Kappa plasmacytoma Iron deficiency anemia Multiple sclerosis  Current Therapy:   Zometa 4 mg IV q. 6 months IV iron as indicated - last received in June 2015     Interim History: Morgan Espinoza is here today for a follow-up. She is doing pretty well. She's had no problems since we last saw her 6 months ago.  She does have multiple sclerosis. She is on Betaseron. She's having no problems with this.  She's had no issues with nausea or vomiting. She's had no pain.  She's had no cough or shortness of breath. She's had no leg swelling.  Her last monoclonal studies did not show any monoclonal spike in her blood. Her IgG level was 1079 mg/dL. Her Kappa Lightchain was 2.5 mg/dL.   She's had no fever. She's had no change in bowel or bladder habits.   Overall, her performance status is ECOG 1.  Medications:  Allergies as of 05/07/2017      Reactions   Celebrex [celecoxib]    Dristan Other (See Comments)   "made my chest tight"   Robitussin (alcohol Free) [guaifenesin] Other (See Comments)   "made my chest tight"   Ropinirole Hcl Other (See Comments)   "made my chest tight"   Tramadol Other (See Comments)   "made my chest tight"      Medication List       Accurate as of 05/07/17 11:11 AM. Always use your most recent med list.          acetaminophen 325 MG tablet Commonly known as:  TYLENOL Take 650 mg by mouth every 6 (six) hours as needed.   albuterol 108 (90 Base) MCG/ACT inhaler Commonly known as:  PROVENTIL HFA;VENTOLIN HFA Inhale 2 puffs into the lungs every 6 (six) hours as needed (cough, shortness of breath or wheezing.).   amLODipine 10 MG tablet Commonly known as:  NORVASC Take 1 tablet (10 mg total) by mouth daily.   aspirin 81 MG tablet Take 81 mg by mouth daily.   atorvastatin 10 MG tablet Commonly known as:  LIPITOR TAKE ONE TABLET BY  MOUTH ONCE DAILY   HYDROcodone-acetaminophen 5-325 MG tablet Commonly known as:  NORCO/VICODIN Take 1 tablet by mouth every 4 (four) hours as needed for moderate pain.   Interferon Beta-1b 0.3 MG Kit injection Commonly known as:  BETASERON Inject 0.25 mg into the skin every other day.   loratadine 10 MG tablet Commonly known as:  CLARITIN Take 10 mg by mouth daily as needed. Reported on 01/10/2016   losartan 50 MG tablet Commonly known as:  COZAAR TAKE ONE TABLET BY MOUTH ONCE DAILY   losartan 50 MG tablet Commonly known as:  COZAAR TAKE ONE TABLET BY MOUTH ONCE DAILY   prednisoLONE acetate 1 % ophthalmic suspension Commonly known as:  PRED FORTE Place 1 drop into the left eye.   ranitidine 150 MG tablet Commonly known as:  ZANTAC TAKE ONE TABLET BY MOUTH TWICE DAILY   vitamin B-12 1000 MCG tablet Commonly known as:  CYANOCOBALAMIN Take 1,000 mcg by mouth daily.       Allergies:  Allergies  Allergen Reactions  . Celebrex [Celecoxib]   . Dristan Other (See Comments)    "made my chest tight"  . Robitussin (Alcohol Free) [Guaifenesin] Other (See Comments)    "made my chest tight"  . Ropinirole Hcl Other (See Comments)    "  made my chest tight"  . Tramadol Other (See Comments)    "made my chest tight"    Past Medical History, Surgical history, Social history, and Family History were reviewed and updated.  Review of Systems: As stated in the interim history   Physical Exam:  weight is 202 lb (91.6 kg). Her oral temperature is 98.1 F (36.7 C). Her blood pressure is 153/80 (abnormal) and her pulse is 64. Her respiration is 18 and oxygen saturation is 98%.   Wt Readings from Last 3 Encounters:  05/07/17 202 lb (91.6 kg)  11/03/16 212 lb 1.9 oz (96.2 kg)  10/30/16 212 lb 1.9 oz (96.2 kg)    Physical Exam  Constitutional: She is oriented to person, place, and time.  HENT:  Head: Normocephalic and atraumatic.  Mouth/Throat: Oropharynx is clear and moist.    Eyes: Pupils are equal, round, and reactive to light. EOM are normal.  Neck: Normal range of motion.  Cardiovascular: Normal rate, regular rhythm and normal heart sounds.   Pulmonary/Chest: Effort normal and breath sounds normal.  Abdominal: Soft. Bowel sounds are normal.  Musculoskeletal: Normal range of motion. She exhibits no edema, tenderness or deformity.  Lymphadenopathy:    She has no cervical adenopathy.  Neurological: She is alert and oriented to person, place, and time.  Skin: Skin is warm and dry. No rash noted. No erythema.  Psychiatric: She has a normal mood and affect. Her behavior is normal. Judgment and thought content normal.  Vitals reviewed.    Lab Results  Component Value Date   WBC 5.0 05/07/2017   HGB 11.5 (L) 05/07/2017   HCT 34.9 05/07/2017   MCV 96 05/07/2017   PLT 194 05/07/2017   Lab Results  Component Value Date   FERRITIN 745 (H) 11/03/2016   IRON 71 11/03/2016   TIBC 261 11/03/2016   UIBC 190 11/03/2016   IRONPCTSAT 27 11/03/2016   Lab Results  Component Value Date   RETICCTPCT 0.9 10/05/2013   RBC 3.64 (L) 05/07/2017   RETICCTABS 36.5 10/05/2013   Lab Results  Component Value Date   KPAFRELGTCHN 3.18 (H) 08/02/2015   LAMBDASER 2.24 08/02/2015   KAPLAMBRATIO 1.14 11/03/2016   Lab Results  Component Value Date   IGGSERUM 1,079 11/03/2016   IGA 204 08/02/2015   IGMSERUM 66 11/03/2016   Lab Results  Component Value Date   TOTALPROTELP 6.8 08/02/2015   ALBUMINELP 3.7 (L) 08/02/2015   A1GS 0.4 (H) 08/02/2015   A2GS 0.8 08/02/2015   BETS 0.4 08/02/2015   BETA2SER 0.4 08/02/2015   GAMS 1.2 08/02/2015   MSPIKE Not Observed 11/01/2015   SPEI * 08/02/2015     Chemistry      Component Value Date/Time   NA 146 (H) 05/07/2017 0907   NA 144 11/17/2016 1049   K 3.9 05/07/2017 0907   K 3.8 11/17/2016 1049   CL 108 05/07/2017 0907   CO2 29 05/07/2017 0907   CO2 26 11/17/2016 1049   BUN 13 05/07/2017 0907   BUN 19.7 11/17/2016 1049    CREATININE 1.3 (H) 05/07/2017 0907   CREATININE 0.8 11/17/2016 1049      Component Value Date/Time   CALCIUM 8.9 05/07/2017 0907   CALCIUM 9.1 11/17/2016 1049   ALKPHOS 73 05/07/2017 0907   ALKPHOS 81 11/17/2016 1049   AST 40 (H) 05/07/2017 0907   AST 30 11/17/2016 1049   ALT 32 05/07/2017 0907   ALT 26 11/17/2016 1049   BILITOT 0.60 05/07/2017 6803  BILITOT 0.38 11/17/2016 1049     Impression and Plan: Ms. Plato is A 66 year old African-American female with a plasmacytoma. This was treated with radiation therapy. So far, there is been no evidence of actual myeloma.  I don't see any problems with her being on Betaseron. I don't think that this would affect her myeloma.  For right now, we will plan to get her back in 6 more months.  She will get her Zometa today.   [name], MD 9/27/201811:11 AM

## 2017-05-07 NOTE — Patient Instructions (Signed)

## 2017-05-08 ENCOUNTER — Encounter: Payer: Self-pay | Admitting: Internal Medicine

## 2017-05-08 ENCOUNTER — Ambulatory Visit (INDEPENDENT_AMBULATORY_CARE_PROVIDER_SITE_OTHER): Payer: Medicare Other | Admitting: Internal Medicine

## 2017-05-08 VITALS — BP 154/78 | HR 80 | Temp 98.5°F | Resp 16 | Wt 202.0 lb

## 2017-05-08 DIAGNOSIS — I10 Essential (primary) hypertension: Secondary | ICD-10-CM | POA: Diagnosis not present

## 2017-05-08 DIAGNOSIS — Z23 Encounter for immunization: Secondary | ICD-10-CM

## 2017-05-08 DIAGNOSIS — E785 Hyperlipidemia, unspecified: Secondary | ICD-10-CM | POA: Diagnosis not present

## 2017-05-08 DIAGNOSIS — J4520 Mild intermittent asthma, uncomplicated: Secondary | ICD-10-CM

## 2017-05-08 DIAGNOSIS — K219 Gastro-esophageal reflux disease without esophagitis: Secondary | ICD-10-CM

## 2017-05-08 LAB — KAPPA/LAMBDA LIGHT CHAINS
IG KAPPA FREE LIGHT CHAIN: 23.8 mg/L — AB (ref 3.3–19.4)
IG LAMBDA FREE LIGHT CHAIN: 23.9 mg/L (ref 5.7–26.3)
Kappa/Lambda FluidC Ratio: 1 (ref 0.26–1.65)

## 2017-05-08 NOTE — Assessment & Plan Note (Signed)
GERD controlled Continue daily medication  

## 2017-05-08 NOTE — Assessment & Plan Note (Signed)
Uses albuterol only as needed, not often No active symptoms Controlled Continue same

## 2017-05-08 NOTE — Assessment & Plan Note (Signed)
BP mildly elevated recently, but in the past year has been reasonably controlled Low sodium diet Resume exercise No change in medications today - if elevated at her next visit may need to adjust medications

## 2017-05-08 NOTE — Assessment & Plan Note (Signed)
Continue statin. 

## 2017-05-08 NOTE — Patient Instructions (Addendum)
   All other Health Maintenance issues reviewed.   All recommended immunizations and age-appropriate screenings are up-to-date or discussed.  Flu immunization administered today.   Medications reviewed and updated.  No changes recommended at this time.    Please followup in 6 months

## 2017-05-11 LAB — MULTIPLE MYELOMA PANEL, SERUM
ALBUMIN/GLOB SERPL: 1.1 (ref 0.7–1.7)
ALPHA 1: 0.3 g/dL (ref 0.0–0.4)
ALPHA2 GLOB SERPL ELPH-MCNC: 0.8 g/dL (ref 0.4–1.0)
Albumin SerPl Elph-Mcnc: 3.4 g/dL (ref 2.9–4.4)
B-GLOBULIN SERPL ELPH-MCNC: 1.1 g/dL (ref 0.7–1.3)
GAMMA GLOB SERPL ELPH-MCNC: 1.2 g/dL (ref 0.4–1.8)
GLOBULIN, TOTAL: 3.4 g/dL (ref 2.2–3.9)
IgA, Qn, Serum: 191 mg/dL (ref 87–352)
IgM, Qn, Serum: 62 mg/dL (ref 26–217)
Total Protein: 6.8 g/dL (ref 6.0–8.5)

## 2017-05-27 NOTE — Progress Notes (Signed)
[facility]  PATIENT: Morgan Espinoza DOB: [birth year]   REASON FOR VISIT: Follow-up for relapsing remitting remitting multiple sclerosis, mild cognitive impairment , vitamin D deficiency HISTORY FROM: Patient    HISTORY OF PRESENT ILLNESS:Morgan Espinoza is a 66 years old right-handed female, follow-up for multiple sclerosis, her primary care physician is Dr. 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 She denies difficulty walking, strokelike symptoms, she denies paresthesia, no gait difficulty, no bowel bladder incontinence.  She has been treated with Betaseron injection since the diagnosis, she tolerates it well, no significant side effect, she has not had flareup for many years.   She was a patient of Dr. Erling Cruz. She also takes medicine for hypertension, hyperlipidemia, iron supplements, stomach problems, I reviewed laboratory evaluations in 2017 there was no significant abnormality in CMP, CBC, ferritin level, TSH   UPDATE Apr 22 2016:YY She retired from school system, no new symptoms from [place],  She also has a diagnosis of plasma cells myeloma, I have personally reviewed CT chest in September 2013, chest x-ray in June 2013, she has stable second rib expansile lytic lesion. She is receiving monthly treatment at [facility].  She apparently has some memory trouble, could not elaborate on the details,  she lives by herself, she denies significant difficulty driving, just retired as a Teacher, early years/pre for [employer].  I reviewed her previous oncologist note from Dr.Ennever, she was receiving zometa monthly and iv iron infusion as needed. We have personally reviewed MRI of the brain with and without  contrast on March 03 2016, Tammi Klippel T-2/flair hyperdensity paraventricular subcortical white matters to her relations at both hemisphere and in the pons, there are many microhemorrhage within the subcortical and deep white matter of the left frontal and temporal lobes, there was no acute findings, she denied a family history of similar disease. Her mother has dementia in her elderly age, died at age 59.  She has one daughter lives in [place]  Reviewed laboratory since 2017, ferritin 883 iron 58, TIBC 242,normal CMP,LDL 70,  cholesterol 188, hepatitis C antibody negative,normal CBC, hemoglobin 11 point 8, igG   Kappa free light chain was elevated 25.4 Normal B12 520 in January 2016,low vitamin D level 20  in July 2014, normal TSH 0.77 UPDATE 03/15/2018CM Morgan Espinoza, 66 year old female returns for follow-up with history of relapsing remitting multiple sclerosis, mild cognitive impairment and low vitamin D level in the past. For her multiple sclerosis she is on Betaseron every other day, she denies any injection site problems, she has not had any relapses in some time. She denies any weakness sensory changes visual disturbance, speech or swallowing problems spasticity problems with bowel or bladder control. She denies any falls. She continues to live alone, independent in all activities of daily living In addition she has mild cognitive impairment, MMSE 26 out of 30, she feels her memory is stable. She returns for reevaluation. She gets little exercise since retirement. UPDATE 10/18/2018CM Morgan Espinoza, 66 year old female returns for follow-up with history of multiple sclerosis, B12 and vitamin D deficiency now on supplements. She has also complained of mild cognitive impairment past but feels her memory is stable at present. She is on Betaseron every  other day for her multiple sclerosis, no recent relapses, denies injection site problems. She denies any new weakness visual disturbance speech or swallowing problems  sensory changes or problems with  bowel or bladder control. She has not had any falls. She remains independent in all activities of daily living, she lives alone. She is retired but gets little exercise was encouraged to do so. Most recent MRI of the brain in July 2017 showed extensive periventricular lesions, the atypical features are supratentorium subcortical microhemorrhage, She returns for reevaluation.   REVIEW OF SYSTEMS: Full 14 system review of systems performed and notable only for those listed, all others are neg:  Constitutional: neg  Cardiovascular: neg Ear/Nose/Throat: neg  Skin: neg Eyes: neg Respiratory: neg Gastroitestinal: neg  Hematology/Lymphatic: neg  Endocrine: neg Musculoskeletal:neg Allergy/Immunology: neg Neurological: Mild cognitive impairment Psychiatric: neg Sleep : neg   ALLERGIES: Allergies  Allergen Reactions  . Celebrex [Celecoxib]   . Dristan Other (See Comments)    "made my chest tight"  . Robitussin (Alcohol Free) [Guaifenesin] Other (See Comments)    "made my chest tight"  . Ropinirole Hcl Other (See Comments)    "made my chest tight"  . Tramadol Other (See Comments)    "made my chest tight"    HOME MEDICATIONS: Outpatient Medications Prior to Visit  Medication Sig Dispense Refill  . acetaminophen (TYLENOL) 325 MG tablet Take 650 mg by mouth every 6 (six) hours as needed.    Marland Kitchen albuterol (PROVENTIL HFA;VENTOLIN HFA) 108 (90 Base) MCG/ACT inhaler Inhale 2 puffs into the lungs every 6 (six) hours as needed (cough, shortness of breath or wheezing.). 1 Inhaler 5  . amLODipine (NORVASC) 10 MG tablet Take 1 tablet (10 mg total) by mouth daily. 90 tablet 3  . aspirin 81 MG tablet Take 81 mg by mouth daily.    Marland Kitchen atorvastatin (LIPITOR) 10 MG tablet TAKE ONE TABLET BY MOUTH ONCE DAILY 90 tablet 3  . HYDROcodone-acetaminophen (NORCO/VICODIN) 5-325 MG tablet Take 1 tablet by mouth every 4 (four) hours as needed for moderate pain.    . Interferon  Beta-1b (BETASERON) 0.3 MG KIT injection Inject 0.25 mg into the skin every other day. 1 kit 11  . loratadine (CLARITIN) 10 MG tablet Take 10 mg by mouth daily as needed. Reported on 01/10/2016    . losartan (COZAAR) 50 MG tablet TAKE ONE TABLET BY MOUTH ONCE DAILY 90 tablet 1  . ranitidine (ZANTAC) 150 MG tablet TAKE ONE TABLET BY MOUTH TWICE DAILY 180 tablet 3  . vitamin B-12 (CYANOCOBALAMIN) 1000 MCG tablet Take 1,000 mcg by mouth daily.    . prednisoLONE acetate (PRED FORTE) 1 % ophthalmic suspension Place 1 drop into the left eye.     No facility-administered medications prior to visit.     PAST MEDICAL HISTORY: Past Medical History:  Diagnosis Date  . Achilles tendonitis   . Anemia, iron deficiency 07/23/2012  . Asthma   . Cancer ([place]) 05/2012   plasma cell myeloma  . GERD (gastroesophageal reflux disease)   . Hyperlipidemia   . Hypertension   . Multiple sclerosis ([place])   . OA (osteoarthritis) of knee   . Obesity   . 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 S/P radiation therapy 08/12/12 - 09/21/12   left Anterior 2nd Rib Lesion / 50.4 GY / 28 Fractions  . Vertigo   . Vitamin D deficiency     PAST SURGICAL HISTORY: Past Surgical History:  Procedure Laterality Date  .  ABDOMINAL HYSTERECTOMY     fibroids  . BONE BIOPSY  05/17/12   Left 2nd Rib, Plasma Cell Myeloma  . BREAST SURGERY     left breast biopsy-benign  . CHOLECYSTECTOMY     lap choley  . ROTATOR CUFF REPAIR      FAMILY HISTORY: Family History  Problem Relation Age of Onset  . Dementia Mother   . Hypertension Mother   . Lung cancer Father        +tobacco  . Diabetes Sister   . Colon cancer Brother        youngest brother  . Diabetes Sister        sister # 16  . Diabetes Brother   . Stomach cancer Neg Hx   . Pancreatic cancer Neg Hx     SOCIAL HISTORY: Social History   Social History  . Marital status: Widowed    Spouse name: N/A  . Number of children: 1  . Years of education: 97     Occupational History  .  Retired  . Retired     Social History Main Topics  . Smoking status: Former Smoker    Packs/day: 0.10    Years: 2.00    Types: Cigarettes    Start date: 10/05/1976    Quit date: 04/19/1979  . Smokeless tobacco: Never Used     Comment: quit 35 years ago   . Alcohol use No  . Drug use: No  . Sexual activity: No   Other Topics Concern  . 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    Patient is retired.    Patient is widowed.    Patient is right handed.    Patient has a high school education.    Patient has 1 child.      PHYSICAL EXAM  Vitals:   05/28/17 1244  BP: 124/62  Pulse: 76  Weight: 201 lb 12.8 oz (91.5 kg)  Height: '5\' 1"'  (1.549 m)   Body mass index is 38.13 kg/m.  Generalized: Well developed, Obese female in no acute distress   Head: normocephalic and atraumatic,. Oropharynx benign  Neck: Supple, Musculoskeletal: No deformity   Neurological examination   Mentation: Alert MMSE not repeated MMSE - Mini Mental State Exam 10/23/2016  Orientation to time 4  Orientation to Place 5  Registration 3  Attention/ Calculation 5  Recall 0  Language- name 2 objects 2  Language- repeat 1  Language- follow 3 step command 3  Language- read & follow direction 1  Write a sentence 1  Copy design 1  Total score 26  Last 21/30. AFt 7, Clock drawing 4/4.  Follows all commands speech and language fluent.   Cranial nerve II-XII: Fundoscopic exam reveals sharp disc margins.Pupils were equal round reactive to light extraocular movements were full, visual field were full on confrontational test. Facial sensation and strength were normal. hearing was intact to finger rubbing bilaterally. Uvula tongue midline. head turning and shoulder shrug were normal and symmetric.Tongue protrusion into cheek strength was normal. Motor: normal bulk and tone, full strength in the BUE, BLE, fine finger  movements normal, no pronator drift. No focal weakness Sensory: normal and symmetric to light touch, pinprick, and  Vibration, in the upper and lower extremities Coordination: finger-nose-finger, heel-to-shin bilaterally, no dysmetria, no tremor Reflexes: Brachioradialis 2/2, biceps 2/2, triceps 2/2, patellar 2/2, Achilles 2/2, plantar responses were flexor bilaterally. Gait and  Station: Rising up from seated position with push off, normal stance,  moderate stride, good arm swing, smooth turning, able to perform tiptoe, and heel walking without difficulty. Tandem gait is mildly unsteady. No assistive device  DIAGNOSTIC DATA (LABS, IMAGING, TESTING) - I reviewed patient records, labs, notes, testing and imaging myself where available.  Lab Results  Component Value Date   WBC 5.0 05/07/2017   HGB 11.5 (L) 05/07/2017   HCT 34.9 05/07/2017   MCV 96 05/07/2017   PLT 194 05/07/2017      Component Value Date/Time   NA 146 (H) 05/07/2017 0907   NA 144 11/17/2016 1049   K 3.9 05/07/2017 0907   K 3.8 11/17/2016 1049   CL 108 05/07/2017 0907   CO2 29 05/07/2017 0907   CO2 26 11/17/2016 1049   GLUCOSE 95 05/07/2017 0907   BUN 13 05/07/2017 0907   BUN 19.7 11/17/2016 1049   CREATININE 1.3 (H) 05/07/2017 0907   CREATININE 0.8 11/17/2016 1049   CALCIUM 8.9 05/07/2017 0907   CALCIUM 9.1 11/17/2016 1049   PROT 7.1 05/07/2017 0907   PROT 6.8 05/07/2017 0907   PROT 7.2 11/17/2016 1049   ALBUMIN 3.5 05/07/2017 0907   ALBUMIN 3.6 11/17/2016 1049   AST 40 (H) 05/07/2017 0907   AST 30 11/17/2016 1049   ALT 32 05/07/2017 0907   ALT 26 11/17/2016 1049   ALKPHOS 73 05/07/2017 0907   ALKPHOS 81 11/17/2016 1049   BILITOT 0.60 05/07/2017 0907   BILITOT 0.38 11/17/2016 1049   GFRNONAA 85 10/23/2016 0945   GFRNONAA >89 01/10/2016 1148   GFRAA 98 10/23/2016 0945   GFRAA >89 01/10/2016 1148   Lab Results  Component Value Date   CHOL 188 01/10/2016   HDL 104 01/10/2016   LDLCALC 70 01/10/2016    TRIG 68 01/10/2016   CHOLHDL 1.8 01/10/2016    Lab Results  Component Value Date   VITAMINB12 290 10/23/2016    ASSESSMENT AND PLAN  66 y.o. year old female  has a past medical history of  Hyperlipidemia; Hypertension; Multiple sclerosis ([place]); OA (osteoarthritis) of knee;  Plasma cell myeloma ([place]) (05/17/12); S/P radiation therapy (08/12/12 - 09/21/12); Vitamin D deficiency B12 deficiency. And mild cognitive impairment here to follow-up  PLAN: Continue Betaseron every other day for relapsing remitting multiple sclerosis CBC and CMP to monitor adverse effects of Betaseron Continue Vitamin D supplement for vitamin D deficiency Will repeat MRI of the brain follow up from 2017 Follow up in 3  Months next with Dr. Krista Blue to review MRI  Continue vitamin B-12 for B12 deficiency Greater than 50% of time during this 25 minute visit was spent on counseling,explanation of diagnosis, planning of further management, discussion with patient  and coordination of care Ileana Ladd NP, [facility], APRN  [facility] [address] [place], [place] [address] [phone]

## 2017-05-28 ENCOUNTER — Encounter: Payer: Self-pay | Admitting: Nurse Practitioner

## 2017-05-28 ENCOUNTER — Ambulatory Visit (INDEPENDENT_AMBULATORY_CARE_PROVIDER_SITE_OTHER): Payer: Medicare Other | Admitting: Nurse Practitioner

## 2017-05-28 VITALS — BP 124/62 | HR 76 | Ht 61.0 in | Wt 201.8 lb

## 2017-05-28 DIAGNOSIS — G35 Multiple sclerosis: Secondary | ICD-10-CM | POA: Diagnosis not present

## 2017-05-28 DIAGNOSIS — G3184 Mild cognitive impairment, so stated: Secondary | ICD-10-CM | POA: Diagnosis not present

## 2017-05-28 DIAGNOSIS — Z5181 Encounter for therapeutic drug level monitoring: Secondary | ICD-10-CM

## 2017-05-28 DIAGNOSIS — E559 Vitamin D deficiency, unspecified: Secondary | ICD-10-CM

## 2017-05-28 DIAGNOSIS — E538 Deficiency of other specified B group vitamins: Secondary | ICD-10-CM

## 2017-05-28 DIAGNOSIS — R413 Other amnesia: Secondary | ICD-10-CM | POA: Diagnosis not present

## 2017-05-28 NOTE — Patient Instructions (Signed)
Continue Betaseron every other day CBC and CMP to monitor adverse effects of Betaseron Continue Vitamin D supplement Will repeat MRI of the brain follow up from 2017 Follow up in 3  Months next with Dr. Krista Blue

## 2017-05-28 NOTE — Progress Notes (Signed)
I have reviewed and agreed above plan. 

## 2017-05-29 ENCOUNTER — Telehealth: Payer: Self-pay | Admitting: *Deleted

## 2017-05-29 LAB — CBC WITH DIFFERENTIAL/PLATELET
Basophils Absolute: 0 10*3/uL (ref 0.0–0.2)
Basos: 0 %
EOS (ABSOLUTE): 0.4 10*3/uL (ref 0.0–0.4)
Eos: 9 %
HEMATOCRIT: 34.8 % (ref 34.0–46.6)
HEMOGLOBIN: 11.2 g/dL (ref 11.1–15.9)
IMMATURE GRANS (ABS): 0 10*3/uL (ref 0.0–0.1)
IMMATURE GRANULOCYTES: 0 %
Lymphocytes Absolute: 1.3 10*3/uL (ref 0.7–3.1)
Lymphs: 26 %
MCH: 30.2 pg (ref 26.6–33.0)
MCHC: 32.2 g/dL (ref 31.5–35.7)
MCV: 94 fL (ref 79–97)
Monocytes Absolute: 0.5 10*3/uL (ref 0.1–0.9)
Monocytes: 10 %
NEUTROS ABS: 2.7 10*3/uL (ref 1.4–7.0)
Neutrophils: 55 %
PLATELETS: 230 10*3/uL (ref 150–379)
RBC: 3.71 x10E6/uL — ABNORMAL LOW (ref 3.77–5.28)
RDW: 13.3 % (ref 12.3–15.4)
WBC: 5 10*3/uL (ref 3.4–10.8)

## 2017-05-29 LAB — COMPREHENSIVE METABOLIC PANEL
ALT: 24 IU/L (ref 0–32)
AST: 31 IU/L (ref 0–40)
Albumin/Globulin Ratio: 1.7 (ref 1.2–2.2)
Albumin: 4.3 g/dL (ref 3.6–4.8)
Alkaline Phosphatase: 77 IU/L (ref 39–117)
BUN/Creatinine Ratio: 19 (ref 12–28)
BUN: 17 mg/dL (ref 8–27)
Bilirubin Total: 0.2 mg/dL (ref 0.0–1.2)
CHLORIDE: 105 mmol/L (ref 96–106)
CO2: 26 mmol/L (ref 20–29)
Calcium: 8.9 mg/dL (ref 8.7–10.3)
Creatinine, Ser: 0.9 mg/dL (ref 0.57–1.00)
GFR calc non Af Amer: 67 mL/min/{1.73_m2} (ref >59)
GFR, EST AFRICAN AMERICAN: 77 mL/min/{1.73_m2} (ref >59)
Globulin, Total: 2.5 g/dL (ref 1.5–4.5)
Glucose: 102 mg/dL — ABNORMAL HIGH (ref 65–99)
POTASSIUM: 3.8 mmol/L (ref 3.5–5.2)
Sodium: 144 mmol/L (ref 134–144)
TOTAL PROTEIN: 6.8 g/dL (ref 6.0–8.5)

## 2017-05-29 NOTE — Telephone Encounter (Signed)
-----   Message from Dennie Bible, NP sent at 05/29/2017  9:19 AM EDT ----- Labs ok please call

## 2017-05-29 NOTE — Telephone Encounter (Signed)
Spoke to pt and let her know that the lab results were good. She verbalized understanding.

## 2017-07-15 ENCOUNTER — Other Ambulatory Visit: Payer: Self-pay | Admitting: Neurology

## 2017-09-01 ENCOUNTER — Encounter (INDEPENDENT_AMBULATORY_CARE_PROVIDER_SITE_OTHER): Payer: Self-pay

## 2017-09-01 ENCOUNTER — Encounter: Payer: Self-pay | Admitting: Neurology

## 2017-09-01 ENCOUNTER — Ambulatory Visit: Payer: Medicare Other | Admitting: Neurology

## 2017-09-01 VITALS — BP 147/71 | HR 69 | Ht 61.0 in | Wt 203.2 lb

## 2017-09-01 DIAGNOSIS — G3184 Mild cognitive impairment, so stated: Secondary | ICD-10-CM

## 2017-09-01 DIAGNOSIS — G35 Multiple sclerosis: Secondary | ICD-10-CM

## 2017-09-01 NOTE — Progress Notes (Signed)
[facility]  PATIENT: Morgan Espinoza DOB: [birth year]  HISTORY OF PRESENT ILLNESS:Ms. Colvin is a 67 years old right-handed female, follow-up for multiple sclerosis, her primary care physician is Dr. 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  She has been treated with Betaseron injection since the diagnosis in 2007, she tolerates it well, no significant side effect, she has not had flareup for many years.   She was a patient of Dr. Erling Cruz. She also takes medicine for hypertension, hyperlipidemia, iron supplements, stomach problems, I reviewed laboratory evaluations in 2017 there was no significant abnormality in CMP, CBC, ferritin level, TSH   UPDATE Apr 22 2016:  She retired from school system, no new symptoms from [place],  She also has a diagnosis of plasma cells myeloma, I have personally reviewed CT chest in September 2013, chest x-ray in June 2013, she has stable second rib expansile lytic lesion. She is receiving monthly treatment at [facility].  She apparently has some memory trouble, could not elaborate on the details,  she lives by herself, she denies significant difficulty driving, just retired as a Teacher, early years/pre for [employer].  I reviewed her previous oncologist note from Dr.Ennever, she was receiving zometa monthly and iv iron infusion as needed. We have personally reviewed MRI of the brain with and without contrast on March 03 2016, multiple T-2/flair hyperdensity paraventricular subcortical white matters to her relations at both hemisphere and in the pons, there are many microhemorrhage within the subcortical and deep white matter of the left frontal and temporal lobes, there was no  acute findings, she denied a family history of similar disease. Her mother has dementia in her elderly age, died at age 23.  She has one daughter lives in [place].  Reviewed laboratory since 2017, ferritin 883 iron 58, TIBC 242,normal CMP,LDL 70,  cholesterol 188, hepatitis C antibody negative,normal CBC, hemoglobin 11 point 8, igG   Kappa free light chain was elevated 25.4 Normal B12 520 in January 2016,low vitamin D level 20  in July 2014, normal TSH 0.77  Update September 01, 2017 She retired now, continue use Betaseron every other day, tolerating it well, there is no relapsing remitting episode,  She complains of gradual onset of mild memory loss, lives alone, drives without getting lost, today's Mini-Mental status is 24/30,  We have personally reviewed MRI of the brain without contrast in July 2017, extensive supratentorium lesions, periventricular, deep and subcortical white matter, and in the pons.  Which also raise the possibility of small vessel disease, there is evidence of many microhemorrhage within the subcortical and deep white matter of left frontal and temporal lobes,   REVIEW OF SYSTEMS: Full 14 system review of systems performed and notable only for those listed, all others are neg:  As above   ALLERGIES: Allergies  Allergen Reactions  . Celebrex [Celecoxib]   . Dristan Other (See Comments)    "made my chest tight"  . Robitussin (Alcohol Free) [Guaifenesin] Other (See Comments)    "made my chest tight"  . Ropinirole Hcl Other (See Comments)    "made my chest tight"  . Tramadol Other (See Comments)    "made my chest tight"    HOME MEDICATIONS: Outpatient Medications Prior  to Visit  Medication Sig Dispense Refill  . acetaminophen (TYLENOL) 325 MG tablet Take 650 mg by mouth every 6 (six) hours as needed.    Marland Kitchen albuterol (PROVENTIL HFA;VENTOLIN HFA) 108 (90 Base) MCG/ACT inhaler Inhale 2 puffs into the lungs every 6 (six) hours as needed (cough, shortness of breath or  wheezing.). 1 Inhaler 5  . amLODipine (NORVASC) 10 MG tablet Take 1 tablet (10 mg total) by mouth daily. 90 tablet 3  . aspirin 81 MG tablet Take 81 mg by mouth daily.    Marland Kitchen atorvastatin (LIPITOR) 10 MG tablet TAKE ONE TABLET BY MOUTH ONCE DAILY 90 tablet 3  . HYDROcodone-acetaminophen (NORCO/VICODIN) 5-325 MG tablet Take 1 tablet by mouth every 4 (four) hours as needed for moderate pain.    . Interferon Beta-1b (BETASERON) 0.3 MG KIT injection Inject 0.54m every other day. 36 vial 3  . loratadine (CLARITIN) 10 MG tablet Take 10 mg by mouth daily as needed. Reported on 01/10/2016    . losartan (COZAAR) 50 MG tablet TAKE ONE TABLET BY MOUTH ONCE DAILY 90 tablet 1  . ranitidine (ZANTAC) 150 MG tablet TAKE ONE TABLET BY MOUTH TWICE DAILY 180 tablet 3  . vitamin B-12 (CYANOCOBALAMIN) 1000 MCG tablet Take 1,000 mcg by mouth daily.    . prednisoLONE acetate (PRED FORTE) 1 % ophthalmic suspension Place 1 drop into the left eye.     No facility-administered medications prior to visit.     PAST MEDICAL HISTORY: Past Medical History:  Diagnosis Date  . Achilles tendonitis   . Anemia, iron deficiency 07/23/2012  . Asthma   . Cancer ([facility] 05/2012   plasma cell myeloma  . GERD (gastroesophageal reflux disease)   . Hyperlipidemia   . Hypertension   . Multiple sclerosis ([place]   . OA (osteoarthritis) of knee   . Obesity   . 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 S/P radiation therapy 08/12/12 - 09/21/12   left Anterior 2nd Rib Lesion / 50.4 GY / 28 Fractions  . Vertigo   . Vitamin D deficiency     PAST SURGICAL HISTORY: Past Surgical History:  Procedure Laterality Date  . ABDOMINAL HYSTERECTOMY     fibroids  . BONE BIOPSY  05/17/12   Left 2nd Rib, Plasma Cell Myeloma  . BREAST SURGERY     left breast biopsy-benign  . CHOLECYSTECTOMY     lap choley  . ROTATOR CUFF REPAIR      FAMILY HISTORY: Family History  Problem Relation Age of Onset  . Dementia Mother   . Hypertension  Mother   . Lung cancer Father        +tobacco  . Diabetes Sister   . Colon cancer Brother        youngest brother  . Diabetes Sister        sister # 138 . Diabetes Brother   . Stomach cancer Neg Hx   . Pancreatic cancer Neg Hx     SOCIAL HISTORY: Social History   Socioeconomic History  . Marital status: Widowed    Spouse name: Not on file  . Number of children: 1  . Years of education: 194 . Highest education level: Not on file  Social Needs  . Financial resource strain: Not on file  . Food insecurity - worry: Not on file  . Food insecurity - inability: Not on file  . Transportation needs - medical: Not on file  . Transportation needs - non-medical:  Not on file  Occupational History    Employer: RETIRED  . Occupation: Retired   Tobacco Use  . Smoking status: Former Smoker    Packs/day: 0.10    Years: 2.00    Pack years: 0.20    Types: Cigarettes    Start date: 10/05/1976    Last attempt to quit: 04/19/1979    Years since quitting: 38.3  . Smokeless tobacco: Never Used  . Tobacco comment: quit 35 years ago   Substance and Sexual Activity  . Alcohol use: No    Alcohol/week: 0.0 oz  . Drug use: No  . Sexual activity: No  Other Topics Concern  . 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    Patient is retired.    Patient is widowed.    Patient is right handed.    Patient has a high school education.    Patient has 1 child.      PHYSICAL EXAM  Vitals:   09/01/17 0926  BP: (!) 147/71  Pulse: 69  Weight: 203 lb 4 oz (92.2 kg)  Height: '5\' 1"'  (1.549 m)   Body mass index is 38.4 kg/m.  Generalized: Well developed, Obese female in no acute distress   Head: normocephalic and atraumatic,. Oropharynx benign  Neck: Supple, Musculoskeletal: No deformity   Neurological examination   Mentation: Alert MMSE not repeated MMSE - Mini Mental State Exam 09/01/2017 10/23/2016  Orientation to time 4 4    Orientation to Place 5 5  Registration 3 3  Attention/ Calculation 3 5  Recall 0 0  Language- name 2 objects 2 2  Language- repeat 1 1  Language- follow 3 step command 3 3  Language- read & follow direction 1 1  Write a sentence 1 1  Copy design 1 1  Total score 24 26  Animal naming 8  Cranial nerve II-XII: Fundoscopic exam reveals sharp disc margins.Pupils were equal round reactive to light extraocular movements were full, visual field were full on confrontational test. Facial sensation and strength were normal. hearing was intact to finger rubbing bilaterally. Uvula tongue midline. head turning and shoulder shrug were normal and symmetric.Tongue protrusion into cheek strength was normal. Motor: normal bulk and tone, full strength in the BUE, BLE, fine finger movements normal, no pronator drift. No focal weakness Sensory: normal and symmetric to light touch, pinprick, and  Vibration, in the upper and lower extremities Coordination: finger-nose-finger, heel-to-shin bilaterally, no dysmetria, no tremor Reflexes: Brachioradialis 2/2, biceps 2/2, triceps 2/2, patellar 2/2, Achilles 2/2, plantar responses were flexor bilaterally. Gait and Station: Rising up from seated position by pushing on chair arm, normal stance,  moderate stride, good arm swing, smooth turning, able to perform tiptoe, and heel walking without difficulty. Tandem gait is mildly unsteady. No assistive device  DIAGNOSTIC DATA (LABS, IMAGING, TESTING) - I reviewed patient records, labs, notes, testing and imaging myself where available.  Lab Results  Component Value Date   WBC 5.0 05/28/2017   HGB 11.2 05/28/2017   HCT 34.8 05/28/2017   MCV 94 05/28/2017   PLT 230 05/28/2017      Component Value Date/Time   NA 144 05/28/2017 1348   NA 146 (H) 05/07/2017 0907   NA 144 11/17/2016 1049   K 3.8 05/28/2017 1348   K 3.9 05/07/2017 0907   K 3.8 11/17/2016 1049   CL 105 05/28/2017 1348   CL 108 05/07/2017 6295  CO2 26  05/28/2017 1348   CO2 29 05/07/2017 0907   CO2 26 11/17/2016 1049   GLUCOSE 102 (H) 05/28/2017 1348   GLUCOSE 95 05/07/2017 0907   BUN 17 05/28/2017 1348   BUN 13 05/07/2017 0907   BUN 19.7 11/17/2016 1049   CREATININE 0.90 05/28/2017 1348   CREATININE 1.3 (H) 05/07/2017 0907   CREATININE 0.8 11/17/2016 1049   CALCIUM 8.9 05/28/2017 1348   CALCIUM 8.9 05/07/2017 0907   CALCIUM 9.1 11/17/2016 1049   PROT 6.8 05/28/2017 1348   PROT 7.1 05/07/2017 0907   PROT 7.2 11/17/2016 1049   ALBUMIN 4.3 05/28/2017 1348   ALBUMIN 3.6 11/17/2016 1049   AST 31 05/28/2017 1348   AST 40 (H) 05/07/2017 0907   AST 30 11/17/2016 1049   ALT 24 05/28/2017 1348   ALT 32 05/07/2017 0907   ALT 26 11/17/2016 1049   ALKPHOS 77 05/28/2017 1348   ALKPHOS 73 05/07/2017 0907   ALKPHOS 81 11/17/2016 1049   BILITOT 0.2 05/28/2017 1348   BILITOT 0.60 05/07/2017 0907   BILITOT 0.38 11/17/2016 1049   GFRNONAA 67 05/28/2017 1348   GFRNONAA >89 01/10/2016 1148   GFRAA 77 05/28/2017 1348   GFRAA >89 01/10/2016 1148   Lab Results  Component Value Date   CHOL 188 01/10/2016   HDL 104 01/10/2016   LDLCALC 70 01/10/2016   TRIG 68 01/10/2016   CHOLHDL 1.8 01/10/2016    Lab Results  Component Value Date   VITAMINB12 290 10/23/2016    ASSESSMENT AND PLAN  67 y.o. 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  Has been stable with Betaseron treatment since 2007,  MRI lesions in the brain showed periventricular, deep subcortical white matter, pons, many microhemorrhage, which also raise the possibility of chronic small vessel disease,  She does has vascular risk factor of aging, hypertension, hyperlipidemia,  We will repeat MRI of the brain with and without contrast,  If she has no significant clinical, and the imaging changes, will consider give her a chance of stop Betaseron treatment,  Continue aspirin daily   Mild cognitive impairment  Mini-Mental Status Examination 24/30,  MRI  of the brain  Laboratory evaluation for treatable etiology   Marcial Pacas, M.D. Ph.D.  [facility] [place], [place] [address] Phone: [phone] Fax:      [phone]

## 2017-09-02 ENCOUNTER — Telehealth: Payer: Self-pay | Admitting: *Deleted

## 2017-09-02 LAB — VITAMIN D 25 HYDROXY (VIT D DEFICIENCY, FRACTURES): VIT D 25 HYDROXY: 38.3 ng/mL (ref 30.0–100.0)

## 2017-09-02 LAB — TSH: TSH: 1.23 u[IU]/mL (ref 0.450–4.500)

## 2017-09-02 LAB — VITAMIN B12: Vitamin B-12: 446 pg/mL (ref 232–1245)

## 2017-09-02 NOTE — Telephone Encounter (Signed)
Left message that labs were normal on her voicemail.  Provided our number to call back with any questions.

## 2017-09-02 NOTE — Telephone Encounter (Signed)
-----   Message from Marcial Pacas, MD sent at 09/02/2017  3:01 PM EST ----- Please call patient for normal laboratory result

## 2017-09-23 ENCOUNTER — Ambulatory Visit
Admission: RE | Admit: 2017-09-23 | Discharge: 2017-09-23 | Disposition: A | Discharge status: Home / Self Care | Payer: Medicare Other | Source: Ambulatory Visit | Attending: Neurology | Admitting: Neurology

## 2017-09-23 DIAGNOSIS — G35 Multiple sclerosis: Secondary | ICD-10-CM | POA: Diagnosis not present

## 2017-09-23 MED ORDER — GADOBENATE DIMEGLUMINE 529 MG/ML IV SOLN
18.0000 mL | Freq: Once | INTRAVENOUS | Status: AC | PRN
  Administered 2017-09-23: 18 mL via INTRAVENOUS

## 2017-09-24 ENCOUNTER — Telehealth: Payer: Self-pay | Admitting: Neurology

## 2017-09-24 NOTE — Telephone Encounter (Signed)
Spoke to patient - she is aware of results and will stop her Betaseron.  She will keep her follow up on 03/02/18.  She will call us prior to her appt with any concerns.

## 2017-09-24 NOTE — Telephone Encounter (Signed)
Please call patient, repeat MRI of the brain still shows significant abnormality, but overall no change compared to previous MRI in 2017, clinical wise she is stable,  Giving her stable imaging findings and the clinical course, she may stop betasron use, continue follow-up in July 2019.  IMPRESSION:  Abnormal MRI scan of the brain showing showing multiple scattered discrete and confluent periventricular and subcortical and to a lesser extent brainstem white matter hyperintensities which may be seen in a variety of conditions including chronic demyelinating disease or chronic small vessel disease or CADASIL. The presence of also several tiny scattered microhemorrhages favors small vessel disease. No enhancing lesions are noted. Overall no significant change compared with the MRI dated 03/03/2016

## 2017-10-27 ENCOUNTER — Other Ambulatory Visit: Payer: Self-pay | Admitting: Internal Medicine

## 2017-11-03 ENCOUNTER — Ambulatory Visit (INDEPENDENT_AMBULATORY_CARE_PROVIDER_SITE_OTHER): Payer: Medicare Other | Admitting: Internal Medicine

## 2017-11-03 ENCOUNTER — Other Ambulatory Visit (INDEPENDENT_AMBULATORY_CARE_PROVIDER_SITE_OTHER): Payer: Medicare Other

## 2017-11-03 ENCOUNTER — Other Ambulatory Visit: Payer: Medicare Other

## 2017-11-03 ENCOUNTER — Encounter: Payer: Self-pay | Admitting: Internal Medicine

## 2017-11-03 VITALS — BP 162/78 | HR 98 | Temp 97.8°F | Resp 16 | Wt 204.0 lb

## 2017-11-03 DIAGNOSIS — I272 Pulmonary hypertension, unspecified: Secondary | ICD-10-CM

## 2017-11-03 DIAGNOSIS — K219 Gastro-esophageal reflux disease without esophagitis: Secondary | ICD-10-CM

## 2017-11-03 DIAGNOSIS — I071 Rheumatic tricuspid insufficiency: Secondary | ICD-10-CM

## 2017-11-03 DIAGNOSIS — R109 Unspecified abdominal pain: Secondary | ICD-10-CM | POA: Diagnosis not present

## 2017-11-03 DIAGNOSIS — C9000 Multiple myeloma not having achieved remission: Secondary | ICD-10-CM

## 2017-11-03 DIAGNOSIS — I10 Essential (primary) hypertension: Secondary | ICD-10-CM

## 2017-11-03 LAB — COMPREHENSIVE METABOLIC PANEL
ALT: 23 U/L (ref 0–35)
AST: 26 U/L (ref 0–37)
Albumin: 4.2 g/dL (ref 3.5–5.2)
Alkaline Phosphatase: 71 U/L (ref 39–117)
BUN: 17 mg/dL (ref 6–23)
CALCIUM: 9.7 mg/dL (ref 8.4–10.5)
CHLORIDE: 105 meq/L (ref 96–112)
CO2: 28 meq/L (ref 19–32)
CREATININE: 0.76 mg/dL (ref 0.40–1.20)
GFR: 97.68 mL/min (ref >60.00)
Glucose, Bld: 94 mg/dL (ref 70–99)
Potassium: 3.6 mEq/L (ref 3.5–5.1)
Sodium: 141 mEq/L (ref 135–145)
Total Bilirubin: 0.5 mg/dL (ref 0.2–1.2)
Total Protein: 7.9 g/dL (ref 6.0–8.3)

## 2017-11-03 LAB — URINALYSIS, ROUTINE W REFLEX MICROSCOPIC
BILIRUBIN URINE: NEGATIVE
HGB URINE DIPSTICK: NEGATIVE
Ketones, ur: NEGATIVE
Leukocytes, UA: NEGATIVE
NITRITE: NEGATIVE
PH: 7.5 (ref 5.0–8.0)
RBC / HPF: NONE SEEN (ref 0–?)
Specific Gravity, Urine: 1.02 (ref 1.000–1.030)
Total Protein, Urine: NEGATIVE
Urine Glucose: NEGATIVE
Urobilinogen, UA: 2 — AB (ref 0.0–1.0)

## 2017-11-03 LAB — CBC WITH DIFFERENTIAL/PLATELET
BASOS PCT: 0.6 % (ref 0.0–3.0)
Basophils Absolute: 0 10*3/uL (ref 0.0–0.1)
EOS ABS: 0.4 10*3/uL (ref 0.0–0.7)
Eosinophils Relative: 7.3 % — ABNORMAL HIGH (ref 0.0–5.0)
HEMATOCRIT: 38.6 % (ref 36.0–46.0)
Hemoglobin: 12.6 g/dL (ref 12.0–15.0)
LYMPHS PCT: 24.9 % (ref 12.0–46.0)
Lymphs Abs: 1.5 10*3/uL (ref 0.7–4.0)
MCHC: 32.5 g/dL (ref 30.0–36.0)
MCV: 94.3 fl (ref 78.0–100.0)
MONOS PCT: 6.9 % (ref 3.0–12.0)
Monocytes Absolute: 0.4 10*3/uL (ref 0.1–1.0)
NEUTROS ABS: 3.5 10*3/uL (ref 1.4–7.7)
Neutrophils Relative %: 60.3 % (ref 43.0–77.0)
PLATELETS: 200 10*3/uL (ref 150.0–400.0)
RBC: 4.09 Mil/uL (ref 3.87–5.11)
RDW: 13.3 % (ref 11.5–15.5)
WBC: 5.8 10*3/uL (ref 4.0–10.5)

## 2017-11-03 MED ORDER — TELMISARTAN 80 MG PO TABS: 80.0000 mg | ORAL_TABLET | Freq: Every day | ORAL | 5 refills | Status: DC

## 2017-11-03 NOTE — Patient Instructions (Addendum)
  Test(s) ordered today. Your results will be released to [place] (or called to you) after review, usually within 72hours after test completion. If any changes need to be made, you will be notified at that same time.  Medications reviewed and updated.  Changes include stop losartan and start telmisartan for your blood pressure.    Your prescription(s) have been submitted to your pharmacy. Please take as directed and contact our office if you believe you are having problem(s) with the medication(s).   Please followup in 2 months

## 2017-11-03 NOTE — Assessment & Plan Note (Addendum)
Pain in epigastric region that is minimal, minimal pain in RMQ and RUQ.  Pain is improving Had diarrhea, but that has seemed to have resolved Will check cbc, cmp, ua, ucx Since symptoms are improving we will hold off for now with imaging, but if symptoms do not resolve completely or get worse will pursue imaging - advised her to call if symptoms persist

## 2017-11-03 NOTE — Assessment & Plan Note (Signed)
Last Echo 2015 Recheck Echo

## 2017-11-03 NOTE — Progress Notes (Signed)
Subjective:    Patient ID: Morgan Espinoza, female    DOB: Aug 01, 1951, 67 y.o.   MRN: [id number]  HPI The patient is here for an acute visit.   Stomach pain:  She has had abdominal pain for about two weeks.  The pain is in the epigastric region and right side of her abdomen.  The pain is intermittent.  There is no radiation of pain.  The pain has improved.  She has had some fever, but not any more. She was having GERD 1-2 time a week, but that has improved and it is controlled now.  She takes her medication twice a day.  Certain foods make her pain worse - fried food.   She denies nausea and vomiting.  She has had some diarrhea or loose stool for the past couple of weeks.  She thought it was gone  - her stools were normal, but yesterday she did have loose stool.    She has taking tylenol.  She denies dysuria, hematuria, and urinary frequency.     Medications and allergies reviewed with patient and updated if appropriate.  Patient Active Problem List   Diagnosis Date Noted  . TR (tricuspid regurgitation) 11/03/2017  . Pulmonary hypertension ([place]) 11/03/2017  . Abdominal pain 11/03/2017  . MCI (mild cognitive impairment) 10/23/2016  . Lytic bone lesions on xray 06/03/2016  . Primary osteoarthritis of both knees 10/12/2014  . B12 deficiency 10/12/2014  . 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 Hyperlipidemia   . Vertigo   . Vitamin D deficiency   . Obesity   . Anemia, iron deficiency 07/23/2012  . Plasma cell myeloma ([place]) 05/17/2012  . Rib lesion 04/28/2012  . Hypertension 06/05/2011  . Asthma 06/05/2011  . GERD (gastroesophageal reflux disease) 06/05/2011  . MS (multiple sclerosis) ([place]) 06/05/2011    Current Outpatient Medications on File Prior to Visit  Medication Sig Dispense Refill  . acetaminophen (TYLENOL) 325 MG tablet Take 650 mg by mouth every 6 (six) hours as needed.    Marland Kitchen albuterol (PROVENTIL HFA;VENTOLIN HFA) 108 (90 Base) MCG/ACT inhaler Inhale 2 puffs into the lungs every  6 (six) hours as needed (cough, shortness of breath or wheezing.). 1 Inhaler 5  . amLODipine (NORVASC) 10 MG tablet Take 1 tablet (10 mg total) by mouth daily. 90 tablet 3  . aspirin 81 MG tablet Take 81 mg by mouth daily.    Marland Kitchen atorvastatin (LIPITOR) 10 MG tablet TAKE ONE TABLET BY MOUTH ONCE DAILY 90 tablet 3  . HYDROcodone-acetaminophen (NORCO/VICODIN) 5-325 MG tablet Take 1 tablet by mouth every 4 (four) hours as needed for moderate pain.    . Interferon Beta-1b (BETASERON) 0.3 MG KIT injection Inject 0.19m every other day. 36 vial 3  . loratadine (CLARITIN) 10 MG tablet Take 10 mg by mouth daily as needed. Reported on 01/10/2016    . ranitidine (ZANTAC) 150 MG tablet TAKE ONE TABLET BY MOUTH TWICE DAILY 180 tablet 3  . vitamin B-12 (CYANOCOBALAMIN) 1000 MCG tablet Take 1,000 mcg by mouth daily.     No current facility-administered medications on file prior to visit.     Past Medical History:  Diagnosis Date  . Achilles tendonitis   . Anemia, iron deficiency 07/23/2012  . Asthma   . Cancer ([facility] 05[place]   plasma cell myeloma  . GERD (gastroesophageal reflux disease)   . Hyperlipidemia   . Hypertension   . Multiple sclerosis ([place]   . OA (osteoarthritis) of knee   . Obesity   .  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 S/P radiation therapy 08/12/12 - 09/21/12   left Anterior 2nd Rib Lesion / 50.4 GY / 28 Fractions  . Vertigo   . Vitamin D deficiency     Past Surgical History:  Procedure Laterality Date  . ABDOMINAL HYSTERECTOMY     fibroids  . BONE BIOPSY  05/17/12   Left 2nd Rib, Plasma Cell Myeloma  . BREAST SURGERY     left breast biopsy-benign  . CHOLECYSTECTOMY     lap choley  . ROTATOR CUFF REPAIR      Social History   Socioeconomic History  . Marital status: Widowed    Spouse name: Not on file  . Number of children: 1  . Years of education: 15  . Highest education level: Not on file  Occupational History    Employer: RETIRED  . Occupation:  Retired   Scientific laboratory technician  . Financial resource strain: Not on file  . Food insecurity:    Worry: Not on file    Inability: Not on file  . Transportation needs:    Medical: Not on file    Non-medical: Not on file  Tobacco Use  . Smoking status: Former Smoker    Packs/day: 0.10    Years: 2.00    Pack years: 0.20    Types: Cigarettes    Start date: 10/05/1976    Last attempt to quit: 04/19/1979    Years since quitting: 38.5  . Smokeless tobacco: Never Used  . Tobacco comment: quit 35 years ago   Substance and Sexual Activity  . Alcohol use: No    Alcohol/week: 0.0 oz  . Drug use: No  . Sexual activity: Never  Lifestyle  . Physical activity:    Days per week: Not on file    Minutes per session: Not on file  . Stress: Not on file  Relationships  . Social connections:    Talks on phone: Not on file    Gets together: Not on file    Attends religious service: Not on file    Active member of club or organization: Not on file    Attends meetings of clubs or organizations: Not on file    Relationship status: Not on file  Other Topics Concern  . 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    Patient is retired.    Patient is widowed.    Patient is right handed.    Patient has a high school education.    Patient has 1 child.     Family History  Problem Relation Age of Onset  . Dementia Mother   . Hypertension Mother   . Lung cancer Father        +tobacco  . Diabetes Sister   . Colon cancer Brother        youngest brother  . Diabetes Sister        sister # 1  . Diabetes Brother   . Stomach cancer Neg Hx   . Pancreatic cancer Neg Hx     Review of Systems  Constitutional: Positive for appetite change (decreased but back to normal) and fever.  Respiratory: Positive for shortness of breath (related to asthma).   Cardiovascular: Negative for chest pain, palpitations and leg swelling.  Gastrointestinal: Positive  for abdominal pain (epigastric) and diarrhea. Negative for blood in stool, nausea and vomiting.  Some GERD, increased gas  Neurological: Positive for headaches.       Objective:   Vitals:   11/03/17 1307  BP: (!) 162/78  Pulse: 98  Resp: 16  Temp: 97.8 F (36.6 C)  SpO2: 96%   BP Readings from Last 3 Encounters:  11/03/17 (!) 162/78  09/01/17 (!) 147/71  05/28/17 124/62    Wt Readings from Last 3 Encounters:  11/03/17 204 lb (92.5 kg)  09/01/17 203 lb 4 oz (92.2 kg)  05/28/17 201 lb 12.8 oz (91.5 kg)   Body mass index is 38.55 kg/m.   Physical Exam  Constitutional: She appears well-developed and well-nourished.  HENT:  Head: Normocephalic and atraumatic.  Eyes: Conjunctivae are normal.  Neck: Neck supple. No tracheal deviation present. No thyromegaly present.  Cardiovascular: Normal rate and regular rhythm.  Murmur (3/6 systolic ) heard. Pulmonary/Chest: Effort normal and breath sounds normal. No respiratory distress. She has no wheezes. She has no rales.  Abdominal: Soft. She exhibits no distension and no mass. There is tenderness (minimal tenderness in RMQ, RUQ and epigastric region). There is no rebound and no guarding.  Musculoskeletal: She exhibits edema (mild b/l LE edema).  Lymphadenopathy:    She has no cervical adenopathy.  Skin: Skin is warm and dry. She is not diaphoretic.           Assessment & Plan:    See Problem List for Assessment and Plan of chronic medical problems.

## 2017-11-03 NOTE — Assessment & Plan Note (Signed)
bp not controlled D/c losartan Start telmisartan 80 mg daily Continue amlodipine 10 mg  cmp today Follow up in 2 months

## 2017-11-03 NOTE — Assessment & Plan Note (Addendum)
Was not controlled recently, but has improved and now she denies GERD Continue zantac 150 mg bid Avoid fried food

## 2017-11-04 ENCOUNTER — Inpatient Hospital Stay: Payer: Medicare Other

## 2017-11-04 ENCOUNTER — Inpatient Hospital Stay: Payer: Medicare Other | Attending: Hematology & Oncology

## 2017-11-04 ENCOUNTER — Inpatient Hospital Stay: Payer: Medicare Other | Admitting: Hematology & Oncology

## 2017-11-05 LAB — URINE CULTURE
MICRO NUMBER: 90376250
RESULT: NO GROWTH
SPECIMEN QUALITY:: ADEQUATE

## 2017-11-06 ENCOUNTER — Ambulatory Visit: Payer: Medicare Other | Admitting: Internal Medicine

## 2017-11-10 ENCOUNTER — Other Ambulatory Visit: Payer: Self-pay

## 2017-11-10 ENCOUNTER — Ambulatory Visit: Payer: Medicare Other | Admitting: Internal Medicine

## 2017-11-10 ENCOUNTER — Ambulatory Visit (HOSPITAL_COMMUNITY): Payer: Medicare Other | Attending: Internal Medicine

## 2017-11-10 DIAGNOSIS — I081 Rheumatic disorders of both mitral and tricuspid valves: Secondary | ICD-10-CM | POA: Insufficient documentation

## 2017-11-10 DIAGNOSIS — I088 Other rheumatic multiple valve diseases: Secondary | ICD-10-CM | POA: Diagnosis not present

## 2017-11-10 DIAGNOSIS — I313 Pericardial effusion (noninflammatory): Secondary | ICD-10-CM | POA: Diagnosis not present

## 2017-11-10 DIAGNOSIS — E785 Hyperlipidemia, unspecified: Secondary | ICD-10-CM | POA: Diagnosis not present

## 2017-11-10 DIAGNOSIS — I071 Rheumatic tricuspid insufficiency: Secondary | ICD-10-CM | POA: Diagnosis not present

## 2017-11-10 DIAGNOSIS — I119 Hypertensive heart disease without heart failure: Secondary | ICD-10-CM | POA: Insufficient documentation

## 2017-11-10 DIAGNOSIS — I272 Pulmonary hypertension, unspecified: Secondary | ICD-10-CM | POA: Insufficient documentation

## 2017-11-15 ENCOUNTER — Telehealth: Payer: Self-pay | Admitting: Internal Medicine

## 2017-11-15 DIAGNOSIS — I3139 Other pericardial effusion (noninflammatory): Secondary | ICD-10-CM

## 2017-11-15 DIAGNOSIS — I313 Pericardial effusion (noninflammatory): Secondary | ICD-10-CM

## 2017-11-15 NOTE — Telephone Encounter (Signed)
Call her regarding her Echo:  Her heart function is normal, but her heart muscle is thickened from her high blood pressure - we need to get her BP well controlled.  She has a couple of mildly leaky valves, one moderate leaky valve and fluid surrounding her heart.  I would like her to see a heart specialist

## 2017-11-16 NOTE — Telephone Encounter (Signed)
Spoke with patient to inform. She is okay with having referral placed to see heart doctor.

## 2017-12-15 ENCOUNTER — Encounter: Payer: Self-pay | Admitting: Internal Medicine

## 2017-12-23 ENCOUNTER — Other Ambulatory Visit: Payer: Self-pay | Admitting: Internal Medicine

## 2017-12-23 DIAGNOSIS — Z1231 Encounter for screening mammogram for malignant neoplasm of breast: Secondary | ICD-10-CM

## 2017-12-25 ENCOUNTER — Ambulatory Visit: Payer: Medicare Other

## 2017-12-29 DIAGNOSIS — I38 Endocarditis, valve unspecified: Secondary | ICD-10-CM | POA: Insufficient documentation

## 2017-12-29 NOTE — Progress Notes (Signed)
Subjective:    Patient ID: Morgan Espinoza, female    DOB: May 13, 1951, 67 y.o.   MRN: [id number]  HPI The patient is here for follow up.  Hypertension with moderate LVH: She is taking her medication daily. She is compliant with a low sodium diet.  She denies chest pain, palpitations, edema, shortness of breath and regular headaches. She is exercising regularly.  She does not monitor her blood pressure at home.    Mild MR, mild TR, mod PR, moderate pericardial effusion:  I referred her to cardiology in April but they were unable to reach patient to schedule an appointment.    GERD:  She is taking her medication daily as prescribed.  She denies any GERD symptoms and feels her GERD is well controlled.    Medications and allergies reviewed with patient and updated if appropriate.  Patient Active Problem List   Diagnosis Date Noted  . TR (tricuspid regurgitation) 11/03/2017  . Pulmonary hypertension ([place]) 11/03/2017  . Abdominal pain 11/03/2017  . MCI (mild cognitive impairment) 10/23/2016  . Lytic bone lesions on xray 06/03/2016  . Primary osteoarthritis of both knees 10/12/2014  . B12 deficiency 10/12/2014  . 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 Hyperlipidemia   . Vertigo   . Vitamin D deficiency   . Obesity   . Anemia, iron deficiency 07/23/2012  . 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 Rib lesion 04/28/2012  . Hypertension 06/05/2011  . Asthma 06/05/2011  . GERD (gastroesophageal reflux disease) 06/05/2011  . MS (multiple sclerosis) ([place]) 06/05/2011    Current Outpatient Medications on File Prior to Visit  Medication Sig Dispense Refill  . acetaminophen (TYLENOL) 325 MG tablet Take 650 mg by mouth every 6 (six) hours as needed.    Marland Kitchen albuterol (PROVENTIL HFA;VENTOLIN HFA) 108 (90 Base) MCG/ACT inhaler Inhale 2 puffs into the lungs every 6 (six) hours as needed (cough, shortness of breath or wheezing.). 1 Inhaler 5  . amLODipine (NORVASC) 10 MG tablet Take 1 tablet (10 mg total)  by mouth daily. 90 tablet 3  . aspirin 81 MG tablet Take 81 mg by mouth daily.    Marland Kitchen atorvastatin (LIPITOR) 10 MG tablet TAKE ONE TABLET BY MOUTH ONCE DAILY 90 tablet 3  . HYDROcodone-acetaminophen (NORCO/VICODIN) 5-325 MG tablet Take 1 tablet by mouth every 4 (four) hours as needed for moderate pain.    . Interferon Beta-1b (BETASERON) 0.3 MG KIT injection Inject 0.59m every other day. 36 vial 3  . loratadine (CLARITIN) 10 MG tablet Take 10 mg by mouth daily as needed. Reported on 01/10/2016    . ranitidine (ZANTAC) 150 MG tablet TAKE ONE TABLET BY MOUTH TWICE DAILY 180 tablet 3  . telmisartan (MICARDIS) 80 MG tablet Take 1 tablet (80 mg total) by mouth daily. 30 tablet 5  . vitamin B-12 (CYANOCOBALAMIN) 1000 MCG tablet Take 1,000 mcg by mouth daily.     No current facility-administered medications on file prior to visit.     Past Medical History:  Diagnosis Date  . Achilles tendonitis   . Anemia, iron deficiency 07/23/2012  . Asthma   . Cancer ([place] 05/2012   plasma cell myeloma  . GERD (gastroesophageal reflux disease)   . Hyperlipidemia   . Hypertension   . Multiple sclerosis ([facility]   . OA (osteoarthritis) of knee   . Obesity   . 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 S/P radiation therapy 08/12/12 - 09/21/12   left Anterior 2nd Rib  Lesion / 50.4 GY / 28 Fractions  . Vertigo   . Vitamin D deficiency     Past Surgical History:  Procedure Laterality Date  . ABDOMINAL HYSTERECTOMY     fibroids  . BONE BIOPSY  05/17/12   Left 2nd Rib, Plasma Cell Myeloma  . BREAST SURGERY     left breast biopsy-benign  . CHOLECYSTECTOMY     lap choley  . ROTATOR CUFF REPAIR      Social History   Socioeconomic History  . Marital status: Widowed    Spouse name: Not on file  . Number of children: 1  . Years of education: 59  . Highest education level: Not on file  Occupational History    Employer: RETIRED  . Occupation: Retired   Scientific laboratory technician  . Financial resource strain:  Not on file  . Food insecurity:    Worry: Not on file    Inability: Not on file  . Transportation needs:    Medical: Not on file    Non-medical: Not on file  Tobacco Use  . Smoking status: Former Smoker    Packs/day: 0.10    Years: 2.00    Pack years: 0.20    Types: Cigarettes    Start date: 10/05/1976    Last attempt to quit: 04/19/1979    Years since quitting: 38.7  . Smokeless tobacco: Never Used  . Tobacco comment: quit 35 years ago   Substance and Sexual Activity  . Alcohol use: No    Alcohol/week: 0.0 oz  . Drug use: No  . Sexual activity: Never  Lifestyle  . Physical activity:    Days per week: Not on file    Minutes per session: Not on file  . Stress: Not on file  Relationships  . Social connections:    Talks on phone: Not on file    Gets together: Not on file    Attends religious service: Not on file    Active member of club or organization: Not on file    Attends meetings of clubs or organizations: Not on file    Relationship status: Not on file  Other Topics Concern  . 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    Patient is retired.    Patient is widowed.    Patient is right handed.    Patient has a high school education.    Patient has 1 child.     Family History  Problem Relation Age of Onset  . Dementia Mother   . Hypertension Mother   . Lung cancer Father        +tobacco  . Diabetes Sister   . Colon cancer Brother        youngest brother  . Diabetes Sister        sister # 53  . Diabetes Brother   . Stomach cancer Neg Hx   . Pancreatic cancer Neg Hx     Review of Systems     Objective:  There were no vitals filed for this visit. BP Readings from Last 3 Encounters:  11/03/17 (!) 162/78  09/01/17 (!) 147/71  05/28/17 124/62   Wt Readings from Last 3 Encounters:  11/03/17 204 lb (92.5 kg)  09/01/17 203 lb 4 oz (92.2 kg)  05/28/17 201 lb 12.8 oz (91.5 kg)   There is no  height or weight on file to calculate BMI.   Physical  Exam    Constitutional: Appears well-developed and well-nourished. No distress.  HENT:  Head: Normocephalic and atraumatic.  Neck: Neck supple. No tracheal deviation present. No thyromegaly present.  No cervical lymphadenopathy Cardiovascular: Normal rate, regular rhythm and normal heart sounds.   No murmur heard. No carotid bruit .  No edema Pulmonary/Chest: Effort normal and breath sounds normal. No respiratory distress. No has no wheezes. No rales.  Skin: Skin is warm and dry. Not diaphoretic.  Psychiatric: Normal mood and affect. Behavior is normal.      Assessment & Plan:    See Problem List for Assessment and Plan of chronic medical problems.   This encounter was created in error - please disregard.

## 2017-12-29 NOTE — Patient Instructions (Signed)
  Test(s) ordered today. Your results will be released to MyChart (or called to you) after review, usually within 72hours after test completion. If any changes need to be made, you will be notified at that same time.  All other Health Maintenance issues reviewed.   All recommended immunizations and age-appropriate screenings are up-to-date or discussed.  No immunizations administered today.   Medications reviewed and updated.  Changes include  /  No changes recommended at this time.  Your prescription(s) have been submitted to your pharmacy. Please take as directed and contact our office if you believe you are having problem(s) with the medication(s).  A referral was ordered for   Please followup in    

## 2017-12-30 ENCOUNTER — Encounter: Payer: Medicare Other | Admitting: Internal Medicine

## 2017-12-30 DIAGNOSIS — Z0289 Encounter for other administrative examinations: Secondary | ICD-10-CM

## 2018-01-05 ENCOUNTER — Encounter: Payer: Self-pay | Admitting: Family Medicine

## 2018-01-11 DIAGNOSIS — H40033 Anatomical narrow angle, bilateral: Secondary | ICD-10-CM | POA: Diagnosis not present

## 2018-01-21 ENCOUNTER — Ambulatory Visit
Admission: RE | Admit: 2018-01-21 | Discharge: 2018-01-21 | Disposition: A | Discharge status: Home / Self Care | Payer: Medicare Other | Source: Ambulatory Visit | Attending: Internal Medicine | Admitting: Internal Medicine

## 2018-01-21 DIAGNOSIS — Z1231 Encounter for screening mammogram for malignant neoplasm of breast: Secondary | ICD-10-CM | POA: Diagnosis not present

## 2018-01-25 ENCOUNTER — Other Ambulatory Visit: Payer: Self-pay | Admitting: Internal Medicine

## 2018-01-25 DIAGNOSIS — R921 Mammographic calcification found on diagnostic imaging of breast: Secondary | ICD-10-CM

## 2018-01-26 ENCOUNTER — Ambulatory Visit: Payer: Medicare Other | Admitting: Cardiology

## 2018-02-14 NOTE — Progress Notes (Signed)
Subjective:    Patient ID: Morgan Espinoza, female    DOB: 28-Dec-1950, 67 y.o.   MRN: [id number]  HPI The patient is here for follow up.   Hypertension with moderate LVH: She is taking her medication daily. She is compliant with a low sodium diet.  She denies chest pain, palpitations, edema, shortness of breath and regular headaches. She is not exercising regularly.     Mild MR, mild TR, mod PR, moderate pericardial effusion:  She denies SOB or chest pain.  She has occasional SOB but feels it is from her asthma.   She has an appointment with cardiology later this month.   GERD:  She is taking her medication daily as prescribed.  She denies any GERD symptoms and feels her GERD is well controlled.   Asthma:  Has occasional flares.  Takes albuterol as needed.  Had a flare a couple of weeks ago.  No symptoms currently.    Medications and allergies reviewed with patient and updated if appropriate.  Patient Active Problem List   Diagnosis Date Noted  . Valvular heart disease 12/29/2017  . Abdominal pain 11/03/2017  . MCI (mild cognitive impairment) 10/23/2016  . Lytic bone lesions on xray 06/03/2016  . Primary osteoarthritis of both knees 10/12/2014  . B12 deficiency 10/12/2014  . 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 Hyperlipidemia   . Vertigo   . Vitamin D deficiency   . Obesity   . Anemia, iron deficiency 07/23/2012  . 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 Rib lesion 04/28/2012  . Hypertension 06/05/2011  . Asthma 06/05/2011  . GERD (gastroesophageal reflux disease) 06/05/2011  . MS (multiple sclerosis) ([place]) 06/05/2011    Current Outpatient Medications on File Prior to Visit  Medication Sig Dispense Refill  . acetaminophen (TYLENOL) 325 MG tablet Take 650 mg by mouth every 6 (six) hours as needed.    Marland Kitchen albuterol (PROVENTIL HFA;VENTOLIN HFA) 108 (90 Base) MCG/ACT inhaler Inhale 2 puffs into the lungs every 6 (six) hours as needed (cough, shortness of breath or wheezing.). 1  Inhaler 5  . amLODipine (NORVASC) 10 MG tablet Take 1 tablet (10 mg total) by mouth daily. 90 tablet 3  . aspirin 81 MG tablet Take 81 mg by mouth daily.    Marland Kitchen atorvastatin (LIPITOR) 10 MG tablet TAKE ONE TABLET BY MOUTH ONCE DAILY 90 tablet 3  . HYDROcodone-acetaminophen (NORCO/VICODIN) 5-325 MG tablet Take 1 tablet by mouth every 4 (four) hours as needed for moderate pain.    Marland Kitchen loratadine (CLARITIN) 10 MG tablet Take 10 mg by mouth daily as needed. Reported on 01/10/2016    . ranitidine (ZANTAC) 150 MG tablet TAKE ONE TABLET BY MOUTH TWICE DAILY 180 tablet 3  . telmisartan (MICARDIS) 80 MG tablet Take 1 tablet (80 mg total) by mouth daily. 30 tablet 5  . vitamin B-12 (CYANOCOBALAMIN) 1000 MCG tablet Take 1,000 mcg by mouth daily.     No current facility-administered medications on file prior to visit.     Past Medical History:  Diagnosis Date  . Achilles tendonitis   . Anemia, iron deficiency 07/23/2012  . Asthma   . Cancer ([place]) 05/2012   plasma cell myeloma  . GERD (gastroesophageal reflux disease)   . Hyperlipidemia   . Hypertension   . Multiple sclerosis ([place])   . OA (osteoarthritis) of knee   . Obesity   . 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 S/P radiation therapy 08/12/12 - 09/21/12  left Anterior 2nd Rib Lesion / 50.4 GY / 28 Fractions  . Vertigo   . Vitamin D deficiency     Past Surgical History:  Procedure Laterality Date  . ABDOMINAL HYSTERECTOMY     fibroids  . BONE BIOPSY  05/17/12   Left 2nd Rib, Plasma Cell Myeloma  . BREAST EXCISIONAL BIOPSY Left    benign  . BREAST SURGERY     left breast biopsy-benign  . CHOLECYSTECTOMY     lap choley  . ROTATOR CUFF REPAIR      Social History   Socioeconomic History  . Marital status: Widowed    Spouse name: Not on file  . Number of children: 1  . Years of education: 35  . Highest education level: Not on file  Occupational History    Employer: RETIRED  . Occupation: Retired   Scientific laboratory technician  .  Financial resource strain: Not on file  . Food insecurity:    Worry: Not on file    Inability: Not on file  . Transportation needs:    Medical: Not on file    Non-medical: Not on file  Tobacco Use  . Smoking status: Former Smoker    Packs/day: 0.10    Years: 2.00    Pack years: 0.20    Types: Cigarettes    Start date: 10/05/1976    Last attempt to quit: 04/19/1979    Years since quitting: 38.8  . Smokeless tobacco: Never Used  . Tobacco comment: quit 35 years ago   Substance and Sexual Activity  . Alcohol use: No    Alcohol/week: 0.0 oz  . Drug use: No  . Sexual activity: Never  Lifestyle  . Physical activity:    Days per week: Not on file    Minutes per session: Not on file  . Stress: Not on file  Relationships  . Social connections:    Talks on phone: Not on file    Gets together: Not on file    Attends religious service: Not on file    Active member of club or organization: Not on file    Attends meetings of clubs or organizations: Not on file    Relationship status: Not on file  Other Topics Concern  . 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    Patient is retired.    Patient is widowed.    Patient is right handed.    Patient has a high school education.    Patient has 1 child.     Family History  Problem Relation Age of Onset  . Dementia Mother   . Hypertension Mother   . Lung cancer Father        +tobacco  . Diabetes Sister   . Colon cancer Brother        youngest brother  . Diabetes Sister        sister # 32  . Diabetes Brother   . Stomach cancer Neg Hx   . Pancreatic cancer Neg Hx     Review of Systems  Constitutional: Negative for appetite change, chills and fever.  Respiratory: Negative for cough, shortness of breath and wheezing.   Cardiovascular: Negative for chest pain, palpitations and leg swelling.  Neurological: Positive for headaches (occasional). Negative for dizziness and  light-headedness.       Objective:   Vitals:   02/16/18 1418  BP: 124/80  Pulse: 66  Resp:  16  Temp: 98.3 F (36.8 C)  SpO2: 96%   BP Readings from Last 3 Encounters:  02/16/18 124/80  11/03/17 (!) 162/78  09/01/17 (!) 147/71   Wt Readings from Last 3 Encounters:  02/16/18 199 lb (90.3 kg)  11/03/17 204 lb (92.5 kg)  09/01/17 203 lb 4 oz (92.2 kg)   Body mass index is 37.6 kg/m.   Physical Exam    Constitutional: Appears well-developed and well-nourished. No distress.  HENT:  Head: Normocephalic and atraumatic.  Neck: Neck supple. No tracheal deviation present. No thyromegaly present.  No cervical lymphadenopathy Cardiovascular: Normal rate, regular rhythm and normal heart sounds.   No murmur heard. No carotid bruit .  No edema Pulmonary/Chest: Effort normal and breath sounds normal. No respiratory distress. No has no wheezes. No rales.  Skin: Skin is warm and dry. Not diaphoretic.  Neuro/Psychiatric: repetitive, Normal mood and affect. Behavior is normal.      Assessment & Plan:    See Problem List for Assessment and Plan of chronic medical problems.

## 2018-02-15 ENCOUNTER — Encounter: Payer: Self-pay | Admitting: Physician Assistant

## 2018-02-16 ENCOUNTER — Ambulatory Visit (INDEPENDENT_AMBULATORY_CARE_PROVIDER_SITE_OTHER): Payer: Medicare Other | Admitting: *Deleted

## 2018-02-16 ENCOUNTER — Encounter: Payer: Self-pay | Admitting: Internal Medicine

## 2018-02-16 ENCOUNTER — Ambulatory Visit (INDEPENDENT_AMBULATORY_CARE_PROVIDER_SITE_OTHER): Payer: Medicare Other | Admitting: Internal Medicine

## 2018-02-16 ENCOUNTER — Other Ambulatory Visit (INDEPENDENT_AMBULATORY_CARE_PROVIDER_SITE_OTHER): Payer: Medicare Other

## 2018-02-16 VITALS — BP 124/80 | HR 66 | Temp 98.3°F | Resp 16 | Ht 61.0 in | Wt 199.0 lb

## 2018-02-16 DIAGNOSIS — Z0000 Encounter for general adult medical examination without abnormal findings: Secondary | ICD-10-CM | POA: Diagnosis not present

## 2018-02-16 DIAGNOSIS — I10 Essential (primary) hypertension: Secondary | ICD-10-CM

## 2018-02-16 DIAGNOSIS — E785 Hyperlipidemia, unspecified: Secondary | ICD-10-CM

## 2018-02-16 DIAGNOSIS — G3184 Mild cognitive impairment, so stated: Secondary | ICD-10-CM | POA: Diagnosis not present

## 2018-02-16 DIAGNOSIS — K219 Gastro-esophageal reflux disease without esophagitis: Secondary | ICD-10-CM | POA: Diagnosis not present

## 2018-02-16 DIAGNOSIS — J4520 Mild intermittent asthma, uncomplicated: Secondary | ICD-10-CM | POA: Diagnosis not present

## 2018-02-16 LAB — CBC WITH DIFFERENTIAL/PLATELET
BASOS ABS: 0 10*3/uL (ref 0.0–0.1)
Basophils Relative: 0.7 % (ref 0.0–3.0)
Eosinophils Absolute: 0.5 10*3/uL (ref 0.0–0.7)
Eosinophils Relative: 7.1 % — ABNORMAL HIGH (ref 0.0–5.0)
HEMATOCRIT: 36.7 % (ref 36.0–46.0)
Hemoglobin: 12.2 g/dL (ref 12.0–15.0)
LYMPHS PCT: 23.1 % (ref 12.0–46.0)
Lymphs Abs: 1.5 10*3/uL (ref 0.7–4.0)
MCHC: 33.2 g/dL (ref 30.0–36.0)
MCV: 93.8 fl (ref 78.0–100.0)
MONO ABS: 0.5 10*3/uL (ref 0.1–1.0)
Monocytes Relative: 8.2 % (ref 3.0–12.0)
NEUTROS ABS: 4 10*3/uL (ref 1.4–7.7)
Neutrophils Relative %: 60.9 % (ref 43.0–77.0)
Platelets: 206 10*3/uL (ref 150.0–400.0)
RBC: 3.91 Mil/uL (ref 3.87–5.11)
RDW: 13.2 % (ref 11.5–15.5)
WBC: 6.6 10*3/uL (ref 4.0–10.5)

## 2018-02-16 LAB — COMPREHENSIVE METABOLIC PANEL
ALBUMIN: 4 g/dL (ref 3.5–5.2)
ALT: 22 U/L (ref 0–35)
AST: 28 U/L (ref 0–37)
Alkaline Phosphatase: 58 U/L (ref 39–117)
BILIRUBIN TOTAL: 0.5 mg/dL (ref 0.2–1.2)
BUN: 19 mg/dL (ref 6–23)
CALCIUM: 9.5 mg/dL (ref 8.4–10.5)
CO2: 32 meq/L (ref 19–32)
CREATININE: 0.9 mg/dL (ref 0.40–1.20)
Chloride: 106 mEq/L (ref 96–112)
GFR: 80.29 mL/min (ref >60.00)
Glucose, Bld: 99 mg/dL (ref 70–99)
Potassium: 4 mEq/L (ref 3.5–5.1)
Sodium: 142 mEq/L (ref 135–145)
TOTAL PROTEIN: 7.4 g/dL (ref 6.0–8.3)

## 2018-02-16 LAB — LIPID PANEL
CHOL/HDL RATIO: 3
Cholesterol: 223 mg/dL — ABNORMAL HIGH (ref 0–200)
HDL: 78.4 mg/dL (ref >39.00)
LDL CALC: 129 mg/dL — AB (ref 0–99)
NONHDL: 144.89
Triglycerides: 78 mg/dL (ref 0.0–149.0)
VLDL: 15.6 mg/dL (ref 0.0–40.0)

## 2018-02-16 NOTE — Assessment & Plan Note (Signed)
Last flare two weeks ago Uses albuterol as needed No current symptoms Continue rescue inhaler as needed

## 2018-02-16 NOTE — Assessment & Plan Note (Signed)
Tested by neurology - Dr Krista Blue - [facility] 09/01/17 - 24/30 Lives independently Has two brothers in the area

## 2018-02-16 NOTE — Patient Instructions (Addendum)
Follow up with Dr Marin Olp. Call to schedule an appointment - [phone]     Test(s) ordered today. Your results will be released to [facility] (or called to you) after review, usually within 72hours after test completion. If any changes need to be made, you will be notified at that same time.  Medications reviewed and updated.  No changes recommended at this time.   Please followup in 6 months

## 2018-02-16 NOTE — Assessment & Plan Note (Signed)
BP well controlled Current regimen effective and well tolerated Continue current medications at current doses  

## 2018-02-16 NOTE — Assessment & Plan Note (Signed)
Check lipid panel  Continue daily statin Regular exercise and healthy diet encouraged  

## 2018-02-16 NOTE — Progress Notes (Addendum)
Subjective:   Lashala Laser is a 67 y.o. female who presents for Medicare Annual (Subsequent) preventive examination.  Review of Systems:  No ROS.  Medicare Wellness Visit. Additional risk factors are reflected in the social history.  Cardiac Risk Factors include: advanced age (>65mn, >>46women);hypertension;obesity (BMI >30kg/m2);dyslipidemia Sleep patterns: feels rested on waking, gets up 1 times nightly to void and sleeps 8-9 hours nightly.    Home Safety/Smoke Alarms: Feels safe in home. Smoke alarms in place.  Living environment; residence and Firearm Safety: 1-story house/ trailer, no firearms . Lives alone, no needs for DME, reports a good support system Seat Belt Safety/Bike Helmet: Wears seat belt.    Objective:     Vitals: BP 124/80   Pulse 66   Temp 98.3 F (36.8 C)   Resp 16   Ht _0  (1.549 m)   Wt 199 lb (90.3 kg)   SpO2 96%   BMI 37.60 kg/m   Body mass index is 37.6 kg/m.  Advanced Directives 02/16/2018 05/07/2017 11/03/2016 10/05/2016 05/05/2016 11/01/2015 08/02/2015  Does Patient Have a Medical Advance Directive? _1  No No  Would patient like information on creating a medical advance directive? Yes (ED - Information included in AVS) - - - No - patient declined information No - patient declined information No - patient declined information  Pre-existing out of facility DNR order (yellow form or pink MOST form) - - - - - - -    Tobacco Social History   Tobacco Use  Smoking Status Former Smoker  . Packs/day: 0.10  . Years: 2.00  . Pack years: 0.20  . Types: Cigarettes  . Start date: 10/05/1976  . Last attempt to quit: 04/19/1979  . Years since quitting: 38.8  Smokeless Tobacco Never Used  Tobacco Comment   quit 35 years ago      Counseling given: Not Answered Comment: quit 35 years ago   Past Medical History:  Diagnosis Date  . Achilles tendonitis   . Anemia, iron deficiency 07/23/2012  . Asthma   . Cancer ([facility] 05/2012   plasma cell  myeloma  . GERD (gastroesophageal reflux disease)   . Hyperlipidemia   . Hypertension   . Multiple sclerosis ([facility]   . OA (osteoarthritis) of knee   . Obesity   . 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 S/P radiation therapy 08/12/12 - 09/21/12   left Anterior 2nd Rib Lesion / 50.4 GY / 28 Fractions  . Vertigo   . Vitamin D deficiency    Past Surgical History:  Procedure Laterality Date  . ABDOMINAL HYSTERECTOMY     fibroids  . BONE BIOPSY  05/17/12   Left 2nd Rib, Plasma Cell Myeloma  . BREAST EXCISIONAL BIOPSY Left    benign  . BREAST SURGERY     left breast biopsy-benign  . CHOLECYSTECTOMY     lap choley  . ROTATOR CUFF REPAIR     Family History  Problem Relation Age of Onset  . Dementia Mother   . Hypertension Mother   . Lung cancer Father        +tobacco  . Diabetes Sister   . Colon cancer Brother        youngest brother  . Diabetes Sister        sister # [id number] . Diabetes Brother   . Stomach cancer Neg Hx   . Pancreatic cancer Neg Hx    Social History   Socioeconomic  History  . Marital status: Widowed    Spouse name: Not on file  . Number of children: 1  . Years of education: 25  . Highest education level: Not on file  Occupational History    Employer: RETIRED  . Occupation: Retired   Scientific laboratory technician  . Financial resource strain: Not hard at all  . Food insecurity:    Worry: Never true    Inability: Never true  . Transportation needs:    Medical: No    Non-medical: No  Tobacco Use  . Smoking status: Former Smoker    Packs/day: 0.10    Years: 2.00    Pack years: 0.20    Types: Cigarettes    Start date: 10/05/1976    Last attempt to quit: 04/19/1979    Years since quitting: 38.8  . Smokeless tobacco: Never Used  . Tobacco comment: quit 35 years ago   Substance and Sexual Activity  . Alcohol use: No    Alcohol/week: 0.0 oz  . Drug use: No  . Sexual activity: Never  Lifestyle  . Physical activity:    Days per week: 0 days    Minutes  per session: 0 min  . Stress: Not at all  Relationships  . Social connections:    Talks on phone: More than three times a week    Gets together: More than three times a week    Attends religious service: More than 4 times per year    Active member of club or organization: Yes    Attends meetings of clubs or organizations: More than 4 times per year    Relationship status: Widowed  Other Topics Concern  . 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    Patient is retired.    Patient is widowed.    Patient is right handed.    Patient has a high school education.    Patient has 1 child.     Outpatient Encounter Medications as of 02/16/2018  Medication Sig  . acetaminophen (TYLENOL) 325 MG tablet Take 650 mg by mouth every 6 (six) hours as needed.  Marland Kitchen albuterol (PROVENTIL HFA;VENTOLIN HFA) 108 (90 Base) MCG/ACT inhaler Inhale 2 puffs into the lungs every 6 (six) hours as needed (cough, shortness of breath or wheezing.).  Marland Kitchen amLODipine (NORVASC) 10 MG tablet Take 1 tablet (10 mg total) by mouth daily.  Marland Kitchen aspirin 81 MG tablet Take 81 mg by mouth daily.  Marland Kitchen atorvastatin (LIPITOR) 10 MG tablet TAKE ONE TABLET BY MOUTH ONCE DAILY  . HYDROcodone-acetaminophen (NORCO/VICODIN) 5-325 MG tablet Take 1 tablet by mouth every 4 (four) hours as needed for moderate pain.  Marland Kitchen loratadine (CLARITIN) 10 MG tablet Take 10 mg by mouth daily as needed. Reported on 01/10/2016  . ranitidine (ZANTAC) 150 MG tablet TAKE ONE TABLET BY MOUTH TWICE DAILY  . telmisartan (MICARDIS) 80 MG tablet Take 1 tablet (80 mg total) by mouth daily.  . vitamin B-12 (CYANOCOBALAMIN) 1000 MCG tablet Take 1,000 mcg by mouth daily.  . [DISCONTINUED] Interferon Beta-1b (BETASERON) 0.3 MG KIT injection Inject 0.43m every other day.   No facility-administered encounter medications on file as of 02/16/2018.     Activities of Daily Living In your present state of health, do you have  any difficulty performing the following activities: 02/16/2018  Hearing? N  Vision? N  Difficulty concentrating or making decisions? N  Comment patient denies any issues  Walking or climbing stairs? N  Dressing or bathing? N  Doing errands, shopping? N  Comment patient denies any issues  Preparing Food and eating ? N  Using the Toilet? N  In the past six months, have you accidently leaked urine? N  Do you have problems with loss of bowel control? N  Managing your Medications? N  Comment patient denies any issues  Managing your Finances? N  Comment patient denies any issues  Housekeeping or managing your Housekeeping? N  Some recent data might be hidden    Patient Care Team: Binnie Rail, MD as PCP - General (Internal Medicine)    Assessment:   This is a routine wellness examination for Morgan Espinoza. Physical assessment deferred to PCP.   Exercise Activities and Dietary recommendations Current Exercise Habits: The patient does not participate in regular exercise at present  Diet (meal preparation, eat out, water intake, caffeinated beverages, dairy products, fruits and vegetables): in general, a "healthy" diet  , well balanced   Reviewed heart healthy diet. Encouraged patient to increase daily water and fluid intake.   Goals    . Patient Stated     Increase the amount I am moving by going back to the gym and working out.        Fall Risk Fall Risk  02/16/2018 05/08/2017 05/05/2016 03/29/2016 01/29/2016  Falls in the past year? _0   Comment - - - - -  Number falls in past yr: - - - - -  Injury with Fall? - - - - -  Risk for fall due to : - - - - -    Depression Screen PHQ 2/9 Scores 02/16/2018 05/08/2017 03/29/2016 01/29/2016  PHQ - 2 Score 0 0 0 0     Cognitive Function MMSE - Mini Mental State Exam 02/16/2018 09/01/2017 10/23/2016  Orientation to time _1 Orientation to Place _2 Registration _3 Attention/ Calculation _4 Recall 0 0 0  Language- name 2  objects _5 Language- repeat _6 Language- follow 3 step command _7 Language- read & follow direction _8 Write a sentence _9 Copy design _10 Total score _11 Immunization History  Administered Date(s) Administered  . Influenza Split 04/18/2012, 04/19/2014, 06/16/2015  . Influenza, High Dose Seasonal PF 03/29/2016, 05/08/2017  . Influenza,inj,Quad PF,6+ Mos 05/06/2013  . Influenza-Unspecified 08/18/2016  . Pneumococcal Conjugate-13 08/07/2015, 02/02/2016  . Pneumococcal Polysaccharide-23 10/29/2017  . Pneumococcal-Unspecified 08/08/2003  . Tdap 08/12/2008, 11/16/2015  . Zoster 10/06/2014  . Zoster Recombinat (Shingrix) 08/29/2017, 10/29/2017   Screening Tests Health Maintenance  Topic Date Due  . DEXA SCAN  02/17/2019 (Originally 01/09/2016)  . INFLUENZA VACCINE  03/11/2018  . COLONOSCOPY  01/30/2019  . MAMMOGRAM  01/22/2020  . TETANUS/TDAP  11/15/2025  . Hepatitis C Screening  Completed  . PNA vac Low Risk Adult  Completed      Plan:     Continue doing brain stimulating activities (puzzles, reading, adult coloring books, staying active) to keep memory sharp.   Continue to eat heart healthy diet (full of fruits, vegetables, whole grains, lean protein, water--limit salt, fat, and sugar intake) and increase physical activity as tolerated.  I have personally reviewed and noted the following in the patient's chart:   . Medical and social history . Use  of alcohol, tobacco or illicit drugs  . Current medications and supplements . Functional ability and status . Nutritional status . Physical activity . Advanced directives . List of other physicians . Vitals . Screenings to include cognitive, depression, and falls . Referrals and appointments  In addition, I have reviewed and discussed with patient certain preventive protocols, quality metrics, and best practice recommendations. A written personalized care plan for preventive services as  well as general preventive health recommendations were provided to patient.     Michiel Cowboy, RN  02/16/2018    Medical screening examination/treatment/procedure(s) were performed by non-physician practitioner and as supervising physician I was immediately available for consultation/collaboration. I agree with above. Binnie Rail, MD

## 2018-02-16 NOTE — Assessment & Plan Note (Signed)
GERD controlled Continue daily medication  

## 2018-02-16 NOTE — Patient Instructions (Signed)
Continue doing brain stimulating activities (puzzles, reading, adult coloring books, staying active) to keep memory sharp.   Continue to eat heart healthy diet (full of fruits, vegetables, whole grains, lean protein, water--limit salt, fat, and sugar intake) and increase physical activity as tolerated.   Morgan Espinoza , Thank you for taking time to come for your Medicare Wellness Visit. I appreciate your ongoing commitment to your health goals. Please review the following plan we discussed and let me know if I can assist you in the future.   These are the goals we discussed: Goals    . Patient Stated     Increase the amount I am moving by going back to the gym and working out.        This is a list of the screening recommended for you and due dates:  Health Maintenance  Topic Date Due  . DEXA scan (bone density measurement)  02/17/2019*  . Flu Shot  03/11/2018  . Colon Cancer Screening  01/30/2019  . Mammogram  01/22/2020  . Tetanus Vaccine  11/15/2025  .  Hepatitis C: One time screening is recommended by Center for Disease Control  (CDC) for  adults born from [birth year] through 1965.   Completed  . Pneumonia vaccines  Completed  *Topic was postponed. The date shown is not the original due date.   Health Maintenance, Female Adopting a healthy lifestyle and getting preventive care can go a long way to promote health and wellness. Talk with your health care provider about what schedule of regular examinations is right for you. This is a good chance for you to check in with your provider about disease prevention and staying healthy. In between checkups, there are plenty of things you can do on your own. Experts have done a lot of research about which lifestyle changes and preventive measures are most likely to keep you healthy. Ask your health care provider for more information. Weight and diet Eat a healthy diet  Be sure to include plenty of vegetables, fruits, low-fat dairy products, and lean  protein.  Do not eat a lot of foods high in solid fats, added sugars, or salt.  Get regular exercise. This is one of the most important things you can do for your health. ? Most adults should exercise for at least 150 minutes each week. The exercise should increase your heart rate and make you sweat (moderate-intensity exercise). ? Most adults should also do strengthening exercises at least twice a week. This is in addition to the moderate-intensity exercise.  Maintain a healthy weight  Body mass index (BMI) is a measurement that can be used to identify possible weight problems. It estimates body fat based on height and weight. Your health care provider can help determine your BMI and help you achieve or maintain a healthy weight.  For females 16 years of age and older: ? A BMI below 18.5 is considered underweight. ? A BMI of 18.5 to 24.9 is normal. ? A BMI of 25 to 29.9 is considered overweight. ? A BMI of 30 and above is considered obese.  Watch levels of cholesterol and blood lipids  You should start having your blood tested for lipids and cholesterol at 67 years of age, then have this test every 5 years.  You may need to have your cholesterol levels checked more often if: ? Your lipid or cholesterol levels are high. ? You are older than 67 years of age. ? You are at high risk for heart disease.  Cancer screening Lung Cancer  Lung cancer screening is recommended for adults 47-31 years old who are at high risk for lung cancer because of a history of smoking.  A yearly low-dose CT scan of the lungs is recommended for people who: ? Currently smoke. ? Have quit within the past 15 years. ? Have at least a 30-pack-year history of smoking. A pack year is smoking an average of one pack of cigarettes a day for 1 year.  Yearly screening should continue until it has been 15 years since you quit.  Yearly screening should stop if you develop a health problem that would prevent you from  having lung cancer treatment.  Breast Cancer  Practice breast self-awareness. This means understanding how your breasts normally appear and feel.  It also means doing regular breast self-exams. Let your health care provider know about any changes, no matter how small.  If you are in your 20s or 30s, you should have a clinical breast exam (CBE) by a health care provider every 1-3 years as part of a regular health exam.  If you are 52 or older, have a CBE every year. Also consider having a breast X-ray (mammogram) every year.  If you have a family history of breast cancer, talk to your health care provider about genetic screening.  If you are at high risk for breast cancer, talk to your health care provider about having an MRI and a mammogram every year.  Breast cancer gene (BRCA) assessment is recommended for women who have family members with BRCA-related cancers. BRCA-related cancers include: ? Breast. ? Ovarian. ? Tubal. ? Peritoneal cancers.  Results of the assessment will determine the need for genetic counseling and BRCA1 and BRCA2 testing.  Cervical Cancer Your health care provider may recommend that you be screened regularly for cancer of the pelvic organs (ovaries, uterus, and vagina). This screening involves a pelvic examination, including checking for microscopic changes to the surface of your cervix (Pap test). You may be encouraged to have this screening done every 3 years, beginning at age 41.  For women ages 37-65, health care providers may recommend pelvic exams and Pap testing every 3 years, or they may recommend the Pap and pelvic exam, combined with testing for human papilloma virus (HPV), every 5 years. Some types of HPV increase your risk of cervical cancer. Testing for HPV may also be done on women of any age with unclear Pap test results.  Other health care providers may not recommend any screening for nonpregnant women who are considered low risk for pelvic cancer  and who do not have symptoms. Ask your health care provider if a screening pelvic exam is right for you.  If you have had past treatment for cervical cancer or a condition that could lead to cancer, you need Pap tests and screening for cancer for at least 20 years after your treatment. If Pap tests have been discontinued, your risk factors (such as having a new sexual partner) need to be reassessed to determine if screening should resume. Some women have medical problems that increase the chance of getting cervical cancer. In these cases, your health care provider may recommend more frequent screening and Pap tests.  Colorectal Cancer  This type of cancer can be detected and often prevented.  Routine colorectal cancer screening usually begins at 67 years of age and continues through 67 years of age.  Your health care provider may recommend screening at an earlier age if you have risk factors  for colon cancer.  Your health care provider may also recommend using home test kits to check for hidden blood in the stool.  A small camera at the end of a tube can be used to examine your colon directly (sigmoidoscopy or colonoscopy). This is done to check for the earliest forms of colorectal cancer.  Routine screening usually begins at age 42.  Direct examination of the colon should be repeated every 5-10 years through 67 years of age. However, you may need to be screened more often if early forms of precancerous polyps or small growths are found.  Skin Cancer  Check your skin from head to toe regularly.  Tell your health care provider about any new moles or changes in moles, especially if there is a change in a mole's shape or color.  Also tell your health care provider if you have a mole that is larger than the size of a pencil eraser.  Always use sunscreen. Apply sunscreen liberally and repeatedly throughout the day.  Protect yourself by wearing long sleeves, pants, a wide-brimmed hat, and  sunglasses whenever you are outside.  Heart disease, diabetes, and high blood pressure  High blood pressure causes heart disease and increases the risk of stroke. High blood pressure is more likely to develop in: ? People who have blood pressure in the high end of the normal range (130-139/85-89 mm Hg). ? People who are overweight or obese. ? People who are African American.  If you are 65-43 years of age, have your blood pressure checked every 3-5 years. If you are 10 years of age or older, have your blood pressure checked every year. You should have your blood pressure measured twice-once when you are at a hospital or clinic, and once when you are not at a hospital or clinic. Record the average of the two measurements. To check your blood pressure when you are not at a hospital or clinic, you can use: ? An automated blood pressure machine at a pharmacy. ? A home blood pressure monitor.  If you are between 36 years and 71 years old, ask your health care provider if you should take aspirin to prevent strokes.  Have regular diabetes screenings. This involves taking a blood sample to check your fasting blood sugar level. ? If you are at a normal weight and have a low risk for diabetes, have this test once every three years after 68 years of age. ? If you are overweight and have a high risk for diabetes, consider being tested at a younger age or more often. Preventing infection Hepatitis B  If you have a higher risk for hepatitis B, you should be screened for this virus. You are considered at high risk for hepatitis B if: ? You were born in a country where hepatitis B is common. Ask your health care provider which countries are considered high risk. ? Your parents were born in a high-risk country, and you have not been immunized against hepatitis B (hepatitis B vaccine). ? You have HIV or AIDS. ? You use needles to inject street drugs. ? You live with someone who has hepatitis B. ? You have  had sex with someone who has hepatitis B. ? You get hemodialysis treatment. ? You take certain medicines for conditions, including cancer, organ transplantation, and autoimmune conditions.  Hepatitis C  Blood testing is recommended for: ? Everyone born from [birth year] through 1965. ? Anyone with known risk factors for hepatitis C.  Sexually transmitted infections (STIs)  You should be screened for sexually transmitted infections (STIs) including gonorrhea and chlamydia if: ? You are sexually active and are younger than 67 years of age. ? You are older than 67 years of age and your health care provider tells you that you are at risk for this type of infection. ? Your sexual activity has changed since you were last screened and you are at an increased risk for chlamydia or gonorrhea. Ask your health care provider if you are at risk.  If you do not have HIV, but are at risk, it may be recommended that you take a prescription medicine daily to prevent HIV infection. This is called pre-exposure prophylaxis (PrEP). You are considered at risk if: ? You are sexually active and do not regularly use condoms or know the HIV status of your partner(s). ? You take drugs by injection. ? You are sexually active with a partner who has HIV.  Talk with your health care provider about whether you are at high risk of being infected with HIV. If you choose to begin PrEP, you should first be tested for HIV. You should then be tested every 3 months for as long as you are taking PrEP. Pregnancy  If you are premenopausal and you may become pregnant, ask your health care provider about preconception counseling.  If you may become pregnant, take 400 to 800 micrograms (mcg) of folic acid every day.  If you want to prevent pregnancy, talk to your health care provider about birth control (contraception). Osteoporosis and menopause  Osteoporosis is a disease in which the bones lose minerals and strength with aging. This  can result in serious bone fractures. Your risk for osteoporosis can be identified using a bone density scan.  If you are 70 years of age or older, or if you are at risk for osteoporosis and fractures, ask your health care provider if you should be screened.  Ask your health care provider whether you should take a calcium or vitamin D supplement to lower your risk for osteoporosis.  Menopause may have certain physical symptoms and risks.  Hormone replacement therapy may reduce some of these symptoms and risks. Talk to your health care provider about whether hormone replacement therapy is right for you. Follow these instructions at home:  Schedule regular health, dental, and eye exams.  Stay current with your immunizations.  Do not use any tobacco products including cigarettes, chewing tobacco, or electronic cigarettes.  If you are pregnant, do not drink alcohol.  If you are breastfeeding, limit how much and how often you drink alcohol.  Limit alcohol intake to no more than 1 drink per day for nonpregnant women. One drink equals 12 ounces of beer, 5 ounces of Morgan Espinoza, or 1 ounces of hard liquor.  Do not use street drugs.  Do not share needles.  Ask your health care provider for help if you need support or information about quitting drugs.  Tell your health care provider if you often feel depressed.  Tell your health care provider if you have ever been abused or do not feel safe at home. This information is not intended to replace advice given to you by your health care provider. Make sure you discuss any questions you have with your health care provider. Document Released: 02/10/2011 Document Revised: 01/03/2016 Document Reviewed: 05/01/2015 Elsevier Interactive Patient Education  [employer]. It is important to avoid accidents which may result in broken bones.  Here are a few ideas on how to make  your home safer so you will be less likely to trip or fall.  1. Use nonskid  mats or non slip strips in your shower or tub, on your bathroom floor and around sinks.  If you know that you have spilled water, wipe it up! 2. In the bathroom, it is important to have properly installed grab bars on the walls or on the edge of the tub.  Towel racks are NOT strong enough for you to hold onto or to pull on for support. 3. Stairs and hallways should have enough light.  Add lamps or night lights if you need ore light. 4. It is good to have handrails on both sides of the stairs if possible.  Always fix broken handrails right away. 5. It is important to see the edges of steps.  Paint the edges of outdoor steps white so you can see them better.  Put colored tape on the edge of inside steps. 6. Throw-rugs are dangerous because they can slide.  Removing the rugs is the best idea, but if they must stay, add adhesive carpet tape to prevent slipping. 7. Do not keep things on stairs or in the halls.  Remove small furniture that blocks the halls as it may cause you to trip.  Keep telephone and electrical cords out of the way where you walk. 8. Always were sturdy, rubber-soled shoes for good support.  Never wear just socks, especially on the stairs.  Socks may cause you to slip or fall.  Do not wear full-length housecoats as you can easily trip on the bottom.  9. Place the things you use the most on the shelves that are the easiest to reach.  If you use a stepstool, make sure it is in good condition.  If you feel unsteady, DO NOT climb, ask for help. 10. If a health professional advises you to use a cane or walker, do not be ashamed.  These items can keep you from falling and breaking your bones.

## 2018-02-19 ENCOUNTER — Other Ambulatory Visit: Payer: Self-pay | Admitting: Emergency Medicine

## 2018-02-19 MED ORDER — ATORVASTATIN CALCIUM 10 MG PO TABS: 10.0000 mg | ORAL_TABLET | Freq: Every day | ORAL | 1 refills | Status: DC

## 2018-03-01 DIAGNOSIS — I3139 Other pericardial effusion (noninflammatory): Secondary | ICD-10-CM | POA: Insufficient documentation

## 2018-03-01 DIAGNOSIS — I313 Pericardial effusion (noninflammatory): Secondary | ICD-10-CM | POA: Insufficient documentation

## 2018-03-01 NOTE — Progress Notes (Signed)
Cardiology Office Note    Date:  03/02/2018   ID:  Morgan Espinoza, DOB [birth year], MRN [id number]  PCP:  Binnie Rail, MD  Cardiologist: Sinclair Grooms, MD new  Chief Complaint  Patient presents with  . New Patient (Initial Visit)    History of Present Illness:  Morgan Espinoza is a 67 y.o. female referred to [place] by Dr. Billey Gosling for evaluation of pericardial effusion.  She has history of hypertension with moderate LVH.  2D echo 11/10/2017 showed normal LVEF 55 to 60% with grade 1 DD mild mitral regurgitation, left atrium moderately dilated.  Mild TR and moderate pulmonary regurgitation.  Systolic pressure was normal.  There was a moderate pericardial effusion with no tamponade physiology.  Previous echo from 2015 she had mild to moderate LVH mild MR mild pulmonary hypertension.  Patient has asthma and wheezes on occasion. Was having more trouble in April and a few weeks ago but it settled done.  Is pretty sedentary and does not do a lot.  Stop going to exercise classes.  Denies any chest pain, shortness of breath unless she has a asthma attack, dyspnea on exertion, pain with inspiration or laying down, palpitations or tachycardia, edema.  No recent fevers or inflammatory process that she is aware of.  Overall she feels well.  Has a history of a lytic lesion on her rib when chest x-ray in 2017.  TSH was normal in January.  Chemistries and CBC stable this month.    Past Medical History:  Diagnosis Date  . Achilles tendonitis   . Anemia, iron deficiency 07/23/2012  . Asthma   . Cancer ([place]) 05/2012   plasma cell myeloma  . GERD (gastroesophageal reflux disease)   . Hyperlipidemia   . Hypertension   . Multiple sclerosis ([place])   . OA (osteoarthritis) of knee   . Obesity   . 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 S/P radiation therapy 08/12/12 - 09/21/12   left Anterior 2nd Rib Lesion / 50.4 GY / 28 Fractions  . Vertigo   . Vitamin D deficiency     Past Surgical History:    Procedure Laterality Date  . ABDOMINAL HYSTERECTOMY     fibroids  . BONE BIOPSY  05/17/12   Left 2nd Rib, Plasma Cell Myeloma  . BREAST EXCISIONAL BIOPSY Left    benign  . BREAST SURGERY     left breast biopsy-benign  . CHOLECYSTECTOMY     lap choley  . ROTATOR CUFF REPAIR      Current Medications: Current Meds  Medication Sig  . acetaminophen (TYLENOL) 325 MG tablet Take 650 mg by mouth every 6 (six) hours as needed.  Marland Kitchen albuterol (PROVENTIL HFA;VENTOLIN HFA) 108 (90 Base) MCG/ACT inhaler Inhale 2 puffs into the lungs every 6 (six) hours as needed (cough, shortness of breath or wheezing.).  Marland Kitchen amLODipine (NORVASC) 10 MG tablet Take 1 tablet (10 mg total) by mouth daily.  Marland Kitchen aspirin 81 MG tablet Take 81 mg by mouth daily.  Marland Kitchen atorvastatin (LIPITOR) 10 MG tablet Take 1 tablet (10 mg total) by mouth daily.  . COMBIGAN 0.2-0.5 % ophthalmic solution as directed.  Marland Kitchen HYDROcodone-acetaminophen (NORCO/VICODIN) 5-325 MG tablet Take 1 tablet by mouth every 4 (four) hours as needed for moderate pain.  Marland Kitchen loratadine (CLARITIN) 10 MG tablet Take 10 mg by mouth daily as needed. Reported on 01/10/2016  . ranitidine (ZANTAC) 150 MG tablet TAKE ONE TABLET BY MOUTH TWICE DAILY  .  telmisartan (MICARDIS) 80 MG tablet Take 1 tablet (80 mg total) by mouth daily.  . vitamin B-12 (CYANOCOBALAMIN) 1000 MCG tablet Take 1,000 mcg by mouth daily.     Allergies:   Celebrex [celecoxib]; Dristan; Robitussin (alcohol free) [guaifenesin]; Ropinirole hcl; and Tramadol   Social History   Socioeconomic History  . Marital status: Widowed    Spouse name: Not on file  . Number of children: 1  . Years of education: 35  . Highest education level: Not on file  Occupational History    Employer: RETIRED  . Occupation: Retired   Scientific laboratory technician  . Financial resource strain: Not hard at all  . Food insecurity:    Worry: Never true    Inability: Never true  . Transportation needs:    Medical: No    Non-medical: No   Tobacco Use  . Smoking status: Former Smoker    Packs/day: 0.10    Years: 2.00    Pack years: 0.20    Types: Cigarettes    Start date: 10/05/1976    Last attempt to quit: 04/19/1979    Years since quitting: 38.8  . Smokeless tobacco: Never Used  . Tobacco comment: quit 35 years ago   Substance and Sexual Activity  . Alcohol use: No    Alcohol/week: 0.0 oz  . Drug use: No  . Sexual activity: Never  Lifestyle  . Physical activity:    Days per week: 0 days    Minutes per session: 0 min  . Stress: Not at all  Relationships  . Social connections:    Talks on phone: More than three times a week    Gets together: More than three times a week    Attends religious service: More than 4 times per year    Active member of club or organization: Yes    Attends meetings of clubs or organizations: More than 4 times per year    Relationship status: Widowed  Other Topics Concern  . 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    Patient is retired.    Patient is widowed.    Patient is right handed.    Patient has a high school education.    Patient has 1 child.      Family History:  The patient's family history includes Colon cancer in her brother; Dementia in her mother; Diabetes in her brother, sister, and sister; Hypertension in her mother; Lung cancer in her father.   ROS:   Please see the history of present illness.    Review of Systems  Constitution: Negative.  HENT: Negative.   Eyes: Negative.   Cardiovascular: Negative.   Respiratory: Positive for snoring and wheezing.   Hematologic/Lymphatic: Negative.   Musculoskeletal: Positive for myalgias. Negative for joint pain.  Gastrointestinal: Positive for diarrhea.  Genitourinary: Negative.   Neurological: Positive for headaches.   All other systems reviewed and are negative.   PHYSICAL EXAM:   VS:  BP 118/76   Pulse 83   Ht 5\' 1"  (1.549 m)   Wt 200 lb 12 oz  (91.1 kg)   SpO2 97%   BMI 37.93 kg/m   Physical Exam  GEN: Well nourished, well developed, in no acute distress  HEENT: normal  Neck: no JVD, carotid bruits, or masses Cardiac:RRR; positive S4, 2/6 systolic murmur in the left sternal border, no rubs, Respiratory:  clear to auscultation bilaterally, normal work of  breathing GI: soft, nontender, nondistended, + BS Ext: without cyanosis, clubbing, or edema, Good distal pulses bilaterally Neuro:  Alert and Oriented x 3 Psych: euthymic mood, full affect  Wt Readings from Last 3 Encounters:  03/02/18 200 lb 12 oz (91.1 kg)  02/16/18 199 lb (90.3 kg)  02/16/18 199 lb (90.3 kg)      Studies/Labs Reviewed:   EKG:  EKG is  ordered today.  The ekg ordered today demonstrates normal sinus rhythm nonspecific ST-T wave changes, no acute change  Recent Labs: 09/01/2017: TSH 1.230 02/16/2018: ALT 22; BUN 19; Creatinine, Ser 0.90; Hemoglobin 12.2; Platelets 206.0; Potassium 4.0; Sodium 142   Lipid Panel    Component Value Date/Time   CHOL 223 (H) 02/16/2018 1545   TRIG 78.0 02/16/2018 1545   HDL 78.40 02/16/2018 1545   CHOLHDL 3 02/16/2018 1545   VLDL 15.6 02/16/2018 1545   LDLCALC 129 (H) 02/16/2018 1545    Additional studies/ records that were reviewed today include:  2D echo 11/10/2017  Study Conclusions   - Left ventricle: The cavity size was normal. There was moderate   concentric hypertrophy. Systolic function was normal. The   estimated ejection fraction was in the range of 55% to 60%. Wall   motion was normal; there were no regional wall motion   abnormalities. Doppler parameters are consistent with abnormal   left ventricular relaxation (grade 1 diastolic dysfunction).   Doppler parameters are consistent with high ventricular filling   pressure. - Aortic valve: Transvalvular velocity was within the normal range.   There was no stenosis. There was no regurgitation. - Mitral valve: Transvalvular velocity was within the normal  range.   There was no evidence for stenosis. There was mild regurgitation. - Left atrium: The atrium was moderately dilated. - Right ventricle: The cavity size was normal. Wall thickness was   normal. Systolic function was normal. - Atrial septum: No defect or patent foramen ovale was identified   by color flow Doppler. - Tricuspid valve: There was mild regurgitation. - Pulmonic valve: There was moderate regurgitation. - Pulmonary arteries: Systolic pressure was within the normal   range. PA peak pressure: 24 mm Hg (S). - Pericardium, extracardiac: A moderate pericardial effusion was   identified. Features were not consistent with tamponade   physiology.   ASSESSMENT:    1. Pericardial effusion   2. Essential hypertension   3. Valvular heart disease      PLAN:  In order of problems listed above:  Pericardial effusion, moderate on echo 11/2017 no evidence of tamponade.  Patient is asymptomatic.  No evidence of fever or inflammatory process or symptoms but she is very sedentary.  Discussed in detail with Dr. Tamala Julian.  We will order a chest x-ray, rheumatoid factor, sed rate, CRP, ANA, Quantiferon.  If sed rate or CRP come back elevated we may give her a trial of colchicine and then repeat the echo.  If they are normal we will repeat the echo now.  Follow-up with myself or Dr. Tamala Julian after these tests are back.  Essential hypertension blood pressure well controlled on amlodipine and Micardis  Valvular heart disease moderate pulmonic regurgitation mild TR mild MR    Medication Adjustments/Labs and Tests Ordered: Current medicines are reviewed at length with the patient today.  Concerns regarding medicines are outlined above.  Medication changes, Labs and Tests ordered today are listed in the Patient Instructions below. Patient Instructions  Medication Instructions:  Your physician recommends that you continue on your current medications  as directed. Please refer to the Current  Medication list given to you today.   Labwork: TODAY:RHEUMATOID FACTOR, SED RATE, C-REACTIVE PROTEIN, ANA, QUANTI-FERON GOLD PLUS   Testing/Procedures: A chest x-ray takes a picture of the organs and structures inside the chest, including the heart, lungs, and blood vessels. This test can show several things, including, whether the heart is enlarges; whether fluid is building up in the lungs; and whether pacemaker / defibrillator leads are still in place. IT IS LOCATED AT [facility].   Follow-Up: Your physician wants you to follow-up WITH DR. Tamala Julian OR Ermalinda Barrios, NP AFTER TESTING IS COMPLETE.  Any Other Special Instructions Will Be Listed Below (If Applicable).     If you need a refill on your cardiac medications before your next appointment, please call your pharmacy.      Signed, Ermalinda Barrios, PA-C  03/02/2018 1:10 PM    [facility] [place], [place], [place]  [address] Phone: [phone]; Fax: [phone]

## 2018-03-02 ENCOUNTER — Encounter: Payer: Self-pay | Admitting: Physician Assistant

## 2018-03-02 ENCOUNTER — Telehealth: Payer: Self-pay | Admitting: *Deleted

## 2018-03-02 ENCOUNTER — Ambulatory Visit (INDEPENDENT_AMBULATORY_CARE_PROVIDER_SITE_OTHER): Payer: Medicare Other | Admitting: Physician Assistant

## 2018-03-02 ENCOUNTER — Ambulatory Visit: Payer: Medicare Other | Admitting: Neurology

## 2018-03-02 VITALS — BP 118/76 | HR 83 | Ht 61.0 in | Wt 200.8 lb

## 2018-03-02 DIAGNOSIS — I313 Pericardial effusion (noninflammatory): Secondary | ICD-10-CM | POA: Diagnosis not present

## 2018-03-02 DIAGNOSIS — I10 Essential (primary) hypertension: Secondary | ICD-10-CM

## 2018-03-02 DIAGNOSIS — I38 Endocarditis, valve unspecified: Secondary | ICD-10-CM | POA: Diagnosis not present

## 2018-03-02 DIAGNOSIS — I3139 Other pericardial effusion (noninflammatory): Secondary | ICD-10-CM

## 2018-03-02 NOTE — Patient Instructions (Signed)
Medication Instructions:  Your physician recommends that you continue on your current medications as directed. Please refer to the Current Medication list given to you today.   Labwork: TODAY:RHEUMATOID FACTOR, SED RATE, C-REACTIVE PROTEIN, ANA, QUANTI-FERON GOLD PLUS   Testing/Procedures: A chest x-ray takes a picture of the organs and structures inside the chest, including the heart, lungs, and blood vessels. This test can show several things, including, whether the heart is enlarges; whether fluid is building up in the lungs; and whether pacemaker / defibrillator leads are still in place. IT IS LOCATED AT [facility].   Follow-Up: Your physician wants you to follow-up WITH DR. Tamala Julian OR Ermalinda Barrios, NP AFTER TESTING IS COMPLETE.  Any Other Special Instructions Will Be Listed Below (If Applicable).     If you need a refill on your cardiac medications before your next appointment, please call your pharmacy.

## 2018-03-02 NOTE — Telephone Encounter (Signed)
No showed follow up appointment. 

## 2018-03-03 ENCOUNTER — Encounter: Payer: Self-pay | Admitting: Neurology

## 2018-03-03 LAB — ANA: Anti Nuclear Antibody(ANA): NEGATIVE

## 2018-03-03 LAB — C-REACTIVE PROTEIN

## 2018-03-03 LAB — SEDIMENTATION RATE: SED RATE: 38 mm/h (ref 0–40)

## 2018-03-03 LAB — RHEUMATOID FACTOR: RHEUMATOID FACTOR: 14.4 [IU]/mL — AB (ref 0.0–13.9)

## 2018-03-04 ENCOUNTER — Telehealth: Payer: Self-pay | Admitting: *Deleted

## 2018-03-04 DIAGNOSIS — I313 Pericardial effusion (noninflammatory): Secondary | ICD-10-CM

## 2018-03-04 DIAGNOSIS — I3139 Other pericardial effusion (noninflammatory): Secondary | ICD-10-CM

## 2018-03-04 NOTE — Telephone Encounter (Signed)
-----   Message from Imogene Burn, PA-C sent at 03/03/2018  3:04 PM EDT ----- ANA, CRP and sed rate are all normal these are all labs to check for inflammation.  Rheumatoid factor is slightly elevated.  Should see primary care about this.  Still waiting on one more lab.  Please order a 2D echo for follow-up of pleural effusion.

## 2018-03-05 NOTE — Addendum Note (Signed)
Addended by: Joaquim Lai on: 03/05/2018 01:52 PM   Modules accepted: Orders

## 2018-03-07 LAB — QUANTIFERON-TB GOLD PLUS
QUANTIFERON NIL VALUE: 0.03 [IU]/mL
QUANTIFERON TB1 AG VALUE: 0.02 [IU]/mL
QUANTIFERON TB2 AG VALUE: 0.03 [IU]/mL
QuantiFERON-TB Gold Plus: NEGATIVE

## 2018-03-08 NOTE — Progress Notes (Signed)
Pt has been made aware of normal result and verbalized understanding.  jw 03/08/18

## 2018-03-10 ENCOUNTER — Other Ambulatory Visit: Payer: Self-pay

## 2018-03-10 ENCOUNTER — Ambulatory Visit (HOSPITAL_COMMUNITY): Payer: Medicare Other | Attending: Cardiovascular Disease

## 2018-03-10 DIAGNOSIS — I119 Hypertensive heart disease without heart failure: Secondary | ICD-10-CM | POA: Diagnosis not present

## 2018-03-10 DIAGNOSIS — I3139 Other pericardial effusion (noninflammatory): Secondary | ICD-10-CM

## 2018-03-10 DIAGNOSIS — E785 Hyperlipidemia, unspecified: Secondary | ICD-10-CM | POA: Insufficient documentation

## 2018-03-10 DIAGNOSIS — I313 Pericardial effusion (noninflammatory): Secondary | ICD-10-CM | POA: Diagnosis not present

## 2018-03-16 ENCOUNTER — Ambulatory Visit: Payer: Medicare Other | Admitting: Physician Assistant

## 2018-03-16 NOTE — Progress Notes (Deleted)
Cardiology Office Note    Date:  03/16/2018   ID:  Morgan Espinoza, DOB [birth year], MRN [id number]  PCP:  Morgan Rail, MD  Cardiologist: Morgan Grooms, MD  No chief complaint on file.   History of Present Illness:  Morgan Espinoza is a 67 y.o. female with history of essential hypertension, valvular heart disease with moderate pulmonic regurgitation mild TR mild MR, who I saw with Morgan Espinoza 03/02/2018 for a moderate pericardial effusion on echo in 11/2017.  Rheumatoid factor slightly elevated at 14.4, sed rate, CRP, ANA, QuantiFERON were all normal.  Repeat 2D echo 03/10/2018 showed only trivial pericardial effusion, normal LV EF 60 to 65% with grade 2 DD, moderately dilated left atrium, ongoing trivial pulmonic regurgitation.  She never had her chest x-ray done.    Past Medical History:  Diagnosis Date  . Achilles tendonitis   . Anemia, iron deficiency 07/23/2012  . Asthma   . Cancer ([place]) 05/2012   plasma cell myeloma  . GERD (gastroesophageal reflux disease)   . Hyperlipidemia   . Hypertension   . Multiple sclerosis ([place])   . OA (osteoarthritis) of knee   . Obesity   . 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 S/P radiation therapy 08/12/12 - 09/21/12   left Anterior 2nd Rib Lesion / 50.4 GY / 28 Fractions  . Vertigo   . Vitamin D deficiency     Past Surgical History:  Procedure Laterality Date  . ABDOMINAL HYSTERECTOMY     fibroids  . BONE BIOPSY  05/17/12   Left 2nd Rib, Plasma Cell Myeloma  . BREAST EXCISIONAL BIOPSY Left    benign  . BREAST SURGERY     left breast biopsy-benign  . CHOLECYSTECTOMY     lap choley  . ROTATOR CUFF REPAIR      Current Medications: No outpatient medications have been marked as taking for the 03/16/18 encounter (Appointment) with Morgan Burn, PA-C.     Allergies:   Celebrex [celecoxib]; Dristan; Robitussin (alcohol free) [guaifenesin]; Ropinirole hcl; and Tramadol   Social History   Socioeconomic History  . Marital  status: Widowed    Spouse name: Not on file  . Number of children: 1  . Years of education: 69  . Highest education level: Not on file  Occupational History    Employer: RETIRED  . Occupation: Retired   Scientific laboratory technician  . Financial resource strain: Not hard at all  . Food insecurity:    Worry: Never true    Inability: Never true  . Transportation needs:    Medical: No    Non-medical: No  Tobacco Use  . Smoking status: Former Smoker    Packs/day: 0.10    Years: 2.00    Pack years: 0.20    Types: Cigarettes    Start date: 10/05/1976    Last attempt to quit: 04/19/1979    Years since quitting: 38.9  . Smokeless tobacco: Never Used  . Tobacco comment: quit 35 years ago   Substance and Sexual Activity  . Alcohol use: No    Alcohol/week: 0.0 oz  . Drug use: No  . Sexual activity: Never  Lifestyle  . Physical activity:    Days per week: 0 days    Minutes per session: 0 min  . Stress: Not at all  Relationships  . Social connections:    Talks on phone: More than three times a week    Gets together: More than three  times a week    Attends religious service: More than 4 times per year    Active member of club or organization: Yes    Attends meetings of clubs or organizations: More than 4 times per year    Relationship status: Widowed  Other Topics Concern  . 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    Patient is retired.    Patient is widowed.    Patient is right handed.    Patient has a high school education.    Patient has 1 child.      Family History:  The patient's ***family history includes Colon cancer in her brother; Dementia in her mother; Diabetes in her brother, sister, and sister; Hypertension in her mother; Lung cancer in her father.   ROS:   Please see the history of present illness.    ROS All other systems reviewed and are negative.   PHYSICAL EXAM:   VS:  There were no vitals taken for this  visit.  Physical Exam  GEN: Well nourished, well developed, in no acute distress  HEENT: normal  Neck: no JVD, carotid bruits, or masses Cardiac:RRR; no murmurs, rubs, or gallops  Respiratory:  clear to auscultation bilaterally, normal work of breathing GI: soft, nontender, nondistended, + BS Ext: without cyanosis, clubbing, or edema, Good distal pulses bilaterally MS: no deformity or atrophy  Skin: warm and dry, no rash Neuro:  Alert and Oriented x 3, Strength and sensation are intact Psych: euthymic mood, full affect  Wt Readings from Last 3 Encounters:  03/02/18 200 lb 12 oz (91.1 kg)  02/16/18 199 lb (90.3 kg)  02/16/18 199 lb (90.3 kg)      Studies/Labs Reviewed:   EKG:  EKG is*** ordered today.  The ekg ordered today demonstrates ***  Recent Labs: 09/01/2017: TSH 1.230 02/16/2018: ALT 22; BUN 19; Creatinine, Ser 0.90; Hemoglobin 12.2; Platelets 206.0; Potassium 4.0; Sodium 142   Lipid Panel    Component Value Date/Time   CHOL 223 (H) 02/16/2018 1545   TRIG 78.0 02/16/2018 1545   HDL 78.40 02/16/2018 1545   CHOLHDL 3 02/16/2018 1545   VLDL 15.6 02/16/2018 1545   LDLCALC 129 (H) 02/16/2018 1545    Additional studies/ records that were reviewed today include:  2D echo 03/10/2018  Pericardial effusion, moderate on echo 11/2017 no evidence of tamponade.  Patient is asymptomatic.  No evidence of fever or inflammatory process or symptoms but she is very sedentary.  Discussed in detail with Morgan Espinoza.  We will order a chest x-ray, rheumatoid factor, sed rate, CRP, ANA, Quantiferon.  If sed rate or CRP come back elevated we may give her a trial of colchicine and then repeat the echo.  If they are normal we will repeat the echo now.  Follow-up with myself or Morgan Espinoza after these tests are back.   Essential hypertension blood pressure well controlled on amlodipine and Micardis   Valvular heart disease moderate pulmonic regurgitation mild TR mild MR  2D echo 4/2019Study  Conclusions   - Left ventricle: The cavity size was normal. There was moderate   concentric hypertrophy. Systolic function was normal. The   estimated ejection fraction was in the range of 55% to 60%. Wall   motion was normal; there were no regional wall motion   abnormalities. Doppler parameters are consistent with abnormal   left ventricular relaxation (grade 1 diastolic dysfunction).  Doppler parameters are consistent with high ventricular filling   pressure. - Aortic valve: Transvalvular velocity was within the normal range.   There was no stenosis. There was no regurgitation. - Mitral valve: Transvalvular velocity was within the normal range.   There was no evidence for stenosis. There was mild regurgitation. - Left atrium: The atrium was moderately dilated. - Right ventricle: The cavity size was normal. Wall thickness was   normal. Systolic function was normal. - Atrial septum: No defect or patent foramen ovale was identified   by color flow Doppler. - Tricuspid valve: There was mild regurgitation. - Pulmonic valve: There was moderate regurgitation. - Pulmonary arteries: Systolic pressure was within the normal   range. PA peak pressure: 24 mm Hg (S). - Pericardium, extracardiac: A moderate pericardial effusion was   identified. Features were not consistent with tamponade   physiology.  ASSESSMENT:    1. Pericardial effusion   2. Essential hypertension   3. Mixed hyperlipidemia      PLAN:  In order of problems listed above:  Pericardial effusion question etiology mostly resolved only trivial on follow-up echo 03/10/2018 .  Rheumatoid factor was slightly elevated but other labs including sed rate, CRP, ANA, QuantiFERON were all normal.  She never had her chest x-ray done  Essential hypertension  Hyperlipidemia LDL 129 total cholesterol 223 she is on atorvastatin  Medication Adjustments/Labs and Tests Ordered: Current medicines are reviewed at length with the patient  today.  Concerns regarding medicines are outlined above.  Medication changes, Labs and Tests ordered today are listed in the Patient Instructions below. There are no Patient Instructions on file for this visit.   Signed, Ermalinda Barrios, PA-C  03/16/2018 8:25 AM    [facility] [place], [place],   [address] Phone: [phone]; Fax: [phone]

## 2018-03-27 ENCOUNTER — Telehealth: Payer: Self-pay | Admitting: Internal Medicine

## 2018-03-27 NOTE — Telephone Encounter (Signed)
Please call patient - I received a letter from the breast center - she had a mammogram done in June and additional imaging ig needed.  They have tried to reach her  -- she needs to contact them to arrange additional imaging.

## 2018-03-29 NOTE — Telephone Encounter (Signed)
Pt aware of note below. States she had no idea about needing additional imaging. Pt was sick and I could barely understand her with her voice being gone. She advised I sent a reminder letter so when she gets better she can call the breast center. Letter sent in the mail.

## 2018-03-30 NOTE — Progress Notes (Signed)
Cardiology Office Note    Date:  03/31/2018   ID:  Morgan Espinoza, DOB [birth year], MRN [id number]  PCP:  Binnie Rail, MD  Cardiologist: Sinclair Grooms, MD  No chief complaint on file.   History of Present Illness:  Morgan Espinoza is a 67 y.o. female with history of hypertension, moderate LVH who I saw with Dr. Tamala Julian for the first time 03/02/2018 for evaluation of pericardial effusion.  The echo 11/2017 LVEF 55 to 60% with grade 1 DD, mild MR, left atrium moderately dilated, mild TR and moderate pulmonary hypertension, moderate pericardial effusions with no tamponade.  Patient was asymptomatic.  There is no evidence of fever or inflammatory process.  She does live a very sedentary lifestyle.  We ordered chest x-ray, rheumatoid factor, sed rate, CRP, ANA, QuantiFERON.  All came back normal except mildly elevated rheumatoid factor so we repeated 2D echo 03/10/2018 normal LVEF 60 to 65% with grade 2 DD and only trivial pericardial effusion.  It should be noted that a mammogram in 01/2018 suggested calcification in the right breast and she needs follow-up which she has not had done yet.  I also ordered a chest x-ray which was never done.  Patient comes in today for follow-up.  She tripped in the parking lot and fell and hit her knees.  She says it is just a little sore but she is not hurt.  She denies any dyspnea, chest pain, dizziness or presyncope.    Past Medical History:  Diagnosis Date  . Achilles tendonitis   . Anemia, iron deficiency 07/23/2012  . Asthma   . Cancer ([facility]) 05/2012   plasma cell myeloma  . GERD (gastroesophageal reflux disease)   . Hyperlipidemia   . Hypertension   . Multiple sclerosis ([facility])   . OA (osteoarthritis) of knee   . Obesity   . 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 S/P radiation therapy 08/12/12 - 09/21/12   left Anterior 2nd Rib Lesion / 50.4 GY / 28 Fractions  . Vertigo   . Vitamin D deficiency     Past Surgical History:  Procedure  Laterality Date  . ABDOMINAL HYSTERECTOMY     fibroids  . BONE BIOPSY  05/17/12   Left 2nd Rib, Plasma Cell Myeloma  . BREAST EXCISIONAL BIOPSY Left    benign  . BREAST SURGERY     left breast biopsy-benign  . CHOLECYSTECTOMY     lap choley  . ROTATOR CUFF REPAIR      Current Medications: Current Meds  Medication Sig  . acetaminophen (TYLENOL) 325 MG tablet Take 650 mg by mouth every 6 (six) hours as needed.  Marland Kitchen albuterol (PROVENTIL HFA;VENTOLIN HFA) 108 (90 Base) MCG/ACT inhaler Inhale 2 puffs into the lungs every 6 (six) hours as needed (cough, shortness of breath or wheezing.).  Marland Kitchen amLODipine (NORVASC) 10 MG tablet Take 1 tablet (10 mg total) by mouth daily.  Marland Kitchen aspirin 81 MG tablet Take 81 mg by mouth daily.  Marland Kitchen atorvastatin (LIPITOR) 10 MG tablet Take 1 tablet (10 mg total) by mouth daily.  . COMBIGAN 0.2-0.5 % ophthalmic solution as directed.  Marland Kitchen HYDROcodone-acetaminophen (NORCO/VICODIN) 5-325 MG tablet Take 1 tablet by mouth every 4 (four) hours as needed for moderate pain.  Marland Kitchen loratadine (CLARITIN) 10 MG tablet Take 10 mg by mouth daily as needed. Reported on 01/10/2016  . ranitidine (ZANTAC) 150 MG tablet TAKE ONE TABLET BY MOUTH TWICE DAILY  . telmisartan (MICARDIS) 80  MG tablet Take 1 tablet (80 mg total) by mouth daily.  . vitamin B-12 (CYANOCOBALAMIN) 1000 MCG tablet Take 1,000 mcg by mouth daily.     Allergies:   Celebrex [celecoxib]; Dristan; Robitussin (alcohol free) [guaifenesin]; Ropinirole hcl; and Tramadol   Social History   Socioeconomic History  . Marital status: Widowed    Spouse name: Not on file  . Number of children: 1  . Years of education: 62  . Highest education level: Not on file  Occupational History    Employer: RETIRED  . Occupation: Retired   Scientific laboratory technician  . Financial resource strain: Not hard at all  . Food insecurity:    Worry: Never true    Inability: Never true  . Transportation needs:    Medical: No    Non-medical: No  Tobacco Use  .  Smoking status: Former Smoker    Packs/day: 0.10    Years: 2.00    Pack years: 0.20    Types: Cigarettes    Start date: 10/05/1976    Last attempt to quit: 04/19/1979    Years since quitting: 38.9  . Smokeless tobacco: Never Used  . Tobacco comment: quit 35 years ago   Substance and Sexual Activity  . Alcohol use: No    Alcohol/week: 0.0 standard drinks  . Drug use: No  . Sexual activity: Never  Lifestyle  . Physical activity:    Days per week: 0 days    Minutes per session: 0 min  . Stress: Not at all  Relationships  . Social connections:    Talks on phone: More than three times a week    Gets together: More than three times a week    Attends religious service: More than 4 times per year    Active member of club or organization: Yes    Attends meetings of clubs or organizations: More than 4 times per year    Relationship status: Widowed  Other Topics Concern  . 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    Patient is retired.    Patient is widowed.    Patient is right handed.    Patient has a high school education.    Patient has 1 child.      Family History:  The patient's family history includes Colon cancer in her brother; Dementia in her mother; Diabetes in her brother, sister, and sister; Hypertension in her mother; Lung cancer in her father.   ROS:   Please see the history of present illness.    Review of Systems  Constitution: Negative.  HENT: Negative.   Eyes: Negative.   Cardiovascular: Negative.   Respiratory: Negative.   Hematologic/Lymphatic: Negative.   Musculoskeletal: Positive for joint pain.  Gastrointestinal: Negative.   Genitourinary: Negative.   Neurological: Negative.    All other systems reviewed and are negative.   PHYSICAL EXAM:   VS:  BP 138/72   Pulse 98   Ht 5\' 2"  (1.575 m)   Wt 193 lb (87.5 kg)   SpO2 97%   BMI 35.30 kg/m   Physical Exam  GEN: Obese, in no acute  distress  Neck: no JVD, carotid bruits, or masses Cardiac:RRR; 1/6 to 2/6 systolic murmur at the apex Respiratory:  clear to auscultation bilaterally, normal work of breathing GI: soft, nontender, nondistended, + BS Ext: without cyanosis, clubbing, or edema, Good distal pulses bilaterally MS: No bruising swelling or scrapes  from fall in the parking lot Neuro:  Alert and Oriented x 3 Psych: euthymic mood, full affect  Wt Readings from Last 3 Encounters:  03/31/18 193 lb (87.5 kg)  03/02/18 200 lb 12 oz (91.1 kg)  02/16/18 199 lb (90.3 kg)      Studies/Labs Reviewed:   EKG:  EKG is not ordered today.  Recent Labs: 09/01/2017: TSH 1.230 02/16/2018: ALT 22; BUN 19; Creatinine, Ser 0.90; Hemoglobin 12.2; Platelets 206.0; Potassium 4.0; Sodium 142   Lipid Panel    Component Value Date/Time   CHOL 223 (H) 02/16/2018 1545   TRIG 78.0 02/16/2018 1545   HDL 78.40 02/16/2018 1545   CHOLHDL 3 02/16/2018 1545   VLDL 15.6 02/16/2018 1545   LDLCALC 129 (H) 02/16/2018 1545    Additional studies/ records that were reviewed today include:  2D echo 7/31/2019Study Conclusions   - Left ventricle: The cavity size was normal. Wall thickness was   increased in a pattern of mild LVH. Systolic function was normal.   The estimated ejection fraction was in the range of 60% to 65%.   Wall motion was normal; there were no regional wall motion   abnormalities. Features are consistent with a pseudonormal left   ventricular filling pattern, with concomitant abnormal relaxation   and increased filling pressure (grade 2 diastolic dysfunction). - Left atrium: The atrium was moderately dilated. - Pericardium, extracardiac: A trivial pericardial effusion was   identified.  2D echo 11/10/2017 Study Conclusions   - Left ventricle: The cavity size was normal. There was moderate   concentric hypertrophy. Systolic function was normal. The   estimated ejection fraction was in the range of 55% to 60%. Wall    motion was normal; there were no regional wall motion   abnormalities. Doppler parameters are consistent with abnormal   left ventricular relaxation (grade 1 diastolic dysfunction).   Doppler parameters are consistent with high ventricular filling   pressure. - Aortic valve: Transvalvular velocity was within the normal range.   There was no stenosis. There was no regurgitation. - Mitral valve: Transvalvular velocity was within the normal range.   There was no evidence for stenosis. There was mild regurgitation. - Left atrium: The atrium was moderately dilated. - Right ventricle: The cavity size was normal. Wall thickness was   normal. Systolic function was normal. - Atrial septum: No defect or patent foramen ovale was identified   by color flow Doppler. - Tricuspid valve: There was mild regurgitation. - Pulmonic valve: There was moderate regurgitation. - Pulmonary arteries: Systolic pressure was within the normal   range. PA peak pressure: 24 mm Hg (S). - Pericardium, extracardiac: A moderate pericardial effusion was   identified. Features were not consistent with tamponade   physiology.     ASSESSMENT:    1. Pericardial effusion   2. Essential hypertension   3. Valvular heart disease   4. Mixed hyperlipidemia      PLAN:  In order of problems listed above:  Pericardial effusion on echo 11/2017 is now only trivial on most recent echo 03/10/2018 patient was asymptomatic.  Full work-up as discussed above has been negative.  Patient did not have chest x-ray done and does have some calcification of her right breast on mammogram and needs follow-up.  I have asked her to have her x-ray done and contact the breast [facility] for further scanning.  No further cardiac work-up for effusions at this time.  Follow-up with Dr. Tamala Julian in a year if needed.  Essential hypertension blood pressure controlled  Valvular heart disease asymptomatic only trivial on follow-up echo  Mixed  hyperlipidemia on Lipitor managed by PCP    Medication Adjustments/Labs and Tests Ordered: Current medicines are reviewed at length with the patient today.  Concerns regarding medicines are outlined above.  Medication changes, Labs and Tests ordered today are listed in the Patient Instructions below. Patient Instructions  Medication Instructions:  Your physician recommends that you continue on your current medications as directed. Please refer to the Current Medication list given to you today.   Labwork: None ordered  Testing/Procedures: None ordered  Follow-Up: Your physician wants you to follow-up in: [employer] will receive a reminder letter in the mail two months in advance. If you don't receive a letter, please call our office to schedule the follow-up appointment.    Any Other Special Instructions Will Be Listed Below (If Applicable). CALL THE [facility] TO SCHEDULE THE TESTING [phone]  ALSO, GO TO [facility], TODAY, TO HAVE THE CHEST XRAY DONE. [address] [place] [address]    If you need a refill on your cardiac medications before your next appointment, please call your pharmacy.      Sumner Boast, PA-C  03/31/2018 11:46 AM    [facility] [place] [address], [place], [place]  [address] Phone: [phone]; Fax: [phone]

## 2018-03-31 ENCOUNTER — Ambulatory Visit: Payer: Medicare Other | Admitting: Physician Assistant

## 2018-03-31 ENCOUNTER — Encounter: Payer: Self-pay | Admitting: Physician Assistant

## 2018-03-31 ENCOUNTER — Ambulatory Visit
Admission: RE | Admit: 2018-03-31 | Discharge: 2018-03-31 | Disposition: A | Discharge status: Home / Self Care | Payer: Medicare Other | Source: Ambulatory Visit | Attending: Physician Assistant | Admitting: Physician Assistant

## 2018-03-31 VITALS — BP 138/72 | HR 98 | Ht 62.0 in | Wt 193.0 lb

## 2018-03-31 DIAGNOSIS — I313 Pericardial effusion (noninflammatory): Secondary | ICD-10-CM

## 2018-03-31 DIAGNOSIS — I38 Endocarditis, valve unspecified: Secondary | ICD-10-CM | POA: Diagnosis not present

## 2018-03-31 DIAGNOSIS — E782 Mixed hyperlipidemia: Secondary | ICD-10-CM

## 2018-03-31 DIAGNOSIS — I3139 Other pericardial effusion (noninflammatory): Secondary | ICD-10-CM

## 2018-03-31 DIAGNOSIS — I10 Essential (primary) hypertension: Secondary | ICD-10-CM | POA: Diagnosis not present

## 2018-03-31 DIAGNOSIS — R0602 Shortness of breath: Secondary | ICD-10-CM | POA: Diagnosis not present

## 2018-03-31 NOTE — Progress Notes (Signed)
Pt has been made aware of normal result and verbalized understanding.  jw 03/31/18

## 2018-03-31 NOTE — Patient Instructions (Signed)
Medication Instructions:  Your physician recommends that you continue on your current medications as directed. Please refer to the Current Medication list given to you today.   Labwork: None ordered  Testing/Procedures: None ordered  Follow-Up: Your physician wants you to follow-up in: [place] will receive a reminder letter in the mail two months in advance. If you don't receive a letter, please call our office to schedule the follow-up appointment.    Any Other Special Instructions Will Be Listed Below (If Applicable). CALL THE [facility] TO SCHEDULE THE TESTING [phone]  ALSO, GO TO [facility], TODAY, TO HAVE THE CHEST XRAY DONE. [address] [place] [address]    If you need a refill on your cardiac medications before your next appointment, please call your pharmacy.

## 2018-04-10 ENCOUNTER — Emergency Department (HOSPITAL_COMMUNITY)
Admission: EM | Admit: 2018-04-10 | Discharge: 2018-04-11 | Disposition: A | Discharge status: Home / Self Care | Payer: Medicare Other | Attending: Emergency Medicine | Admitting: Emergency Medicine

## 2018-04-10 ENCOUNTER — Emergency Department (HOSPITAL_COMMUNITY): Payer: Medicare Other

## 2018-04-10 ENCOUNTER — Encounter (HOSPITAL_COMMUNITY): Payer: Self-pay

## 2018-04-10 ENCOUNTER — Other Ambulatory Visit: Payer: Self-pay

## 2018-04-10 DIAGNOSIS — R413 Other amnesia: Secondary | ICD-10-CM | POA: Diagnosis present

## 2018-04-10 DIAGNOSIS — F0391 Unspecified dementia with behavioral disturbance: Secondary | ICD-10-CM | POA: Diagnosis not present

## 2018-04-10 DIAGNOSIS — Z87891 Personal history of nicotine dependence: Secondary | ICD-10-CM | POA: Insufficient documentation

## 2018-04-10 DIAGNOSIS — Z7982 Long term (current) use of aspirin: Secondary | ICD-10-CM | POA: Insufficient documentation

## 2018-04-10 DIAGNOSIS — F23 Brief psychotic disorder: Secondary | ICD-10-CM | POA: Diagnosis not present

## 2018-04-10 DIAGNOSIS — Z79899 Other long term (current) drug therapy: Secondary | ICD-10-CM | POA: Insufficient documentation

## 2018-04-10 DIAGNOSIS — F03918 Unspecified dementia, unspecified severity, with other behavioral disturbance: Secondary | ICD-10-CM | POA: Diagnosis present

## 2018-04-10 DIAGNOSIS — I10 Essential (primary) hypertension: Secondary | ICD-10-CM | POA: Insufficient documentation

## 2018-04-10 DIAGNOSIS — I700 Atherosclerosis of aorta: Secondary | ICD-10-CM | POA: Diagnosis not present

## 2018-04-10 DIAGNOSIS — J45909 Unspecified asthma, uncomplicated: Secondary | ICD-10-CM | POA: Insufficient documentation

## 2018-04-10 DIAGNOSIS — R479 Unspecified speech disturbances: Secondary | ICD-10-CM | POA: Diagnosis not present

## 2018-04-10 DIAGNOSIS — Z8579 Personal history of other malignant neoplasms of lymphoid, hematopoietic and related tissues: Secondary | ICD-10-CM | POA: Diagnosis not present

## 2018-04-10 LAB — URINALYSIS, ROUTINE W REFLEX MICROSCOPIC
Bilirubin Urine: NEGATIVE
Glucose, UA: NEGATIVE mg/dL
KETONES UR: NEGATIVE mg/dL
Nitrite: NEGATIVE
PROTEIN: NEGATIVE mg/dL
Specific Gravity, Urine: 1.021 (ref 1.005–1.030)
pH: 5 (ref 5.0–8.0)

## 2018-04-10 LAB — CBC
HEMATOCRIT: 35.4 % — AB (ref 36.0–46.0)
Hemoglobin: 11.5 g/dL — ABNORMAL LOW (ref 12.0–15.0)
MCH: 30.7 pg (ref 26.0–34.0)
MCHC: 32.5 g/dL (ref 30.0–36.0)
MCV: 94.4 fL (ref 78.0–100.0)
Platelets: 239 10*3/uL (ref 150–400)
RBC: 3.75 MIL/uL — ABNORMAL LOW (ref 3.87–5.11)
RDW: 12.2 % (ref 11.5–15.5)
WBC: 5 10*3/uL (ref 4.0–10.5)

## 2018-04-10 LAB — COMPREHENSIVE METABOLIC PANEL
ALBUMIN: 3.7 g/dL (ref 3.5–5.0)
ALK PHOS: 61 U/L (ref 38–126)
ALT: 24 U/L (ref 0–44)
ANION GAP: 9 (ref 5–15)
AST: 30 U/L (ref 15–41)
BILIRUBIN TOTAL: 0.4 mg/dL (ref 0.3–1.2)
BUN: 17 mg/dL (ref 8–23)
CALCIUM: 9.6 mg/dL (ref 8.9–10.3)
CO2: 30 mmol/L (ref 22–32)
Chloride: 108 mmol/L (ref 98–111)
Creatinine, Ser: 1.18 mg/dL — ABNORMAL HIGH (ref 0.44–1.00)
GFR calc Af Amer: 54 mL/min — ABNORMAL LOW (ref >60)
GFR, EST NON AFRICAN AMERICAN: 47 mL/min — AB (ref >60)
GLUCOSE: 87 mg/dL (ref 70–99)
Potassium: 3.6 mmol/L (ref 3.5–5.1)
Sodium: 147 mmol/L — ABNORMAL HIGH (ref 135–145)
TOTAL PROTEIN: 7.5 g/dL (ref 6.5–8.1)

## 2018-04-10 LAB — LIPASE, BLOOD: Lipase: 48 U/L (ref 11–51)

## 2018-04-10 NOTE — ED Notes (Signed)
Patient transported to CT 

## 2018-04-10 NOTE — ED Triage Notes (Addendum)
Pt's daughter providing history. She states that her mother has been talking to people not there. She states that she has been looking to people no where nearby. Her PCP is concerned for med overlap. Daughter also states mother has stopped taking her MS med- betasaron (sp?). Pt states she had some tests done and was told to d/c. Pt forgets things like what she ate today. Pt also does not know who the president is, but is oriented otherwise. Daughter also states that her mother has been complaining about her stomach, and that she has not slept well because "she has the runs". Daughter also states mother fell approx 2 weeks ago and hit head.

## 2018-04-10 NOTE — ED Provider Notes (Signed)
[facility] DEPT Provider Note  CSN: [id number] Arrival date & time: 04/10/18  1815    History   Chief Complaint Chief Complaint  Patient presents with  . Memory Loss  . Abdominal Pain    HPI Morgan Espinoza is a 67 y.o. female with a medical history of MS, myeloma, HTN, HLD and cognitive impairment who presented to the ED with her adult daughter for memory concerns. Patient states that she has experienced some decline in her memory, but does not think that it is anything out of the ordinary. She has no specific complaints today, but states that she has had diarrhea over the last couple of months. Denies auditory/visual hallucinations or thoughts of self-harming.  Additional history obtained by adult daughter who lives out of state. Patient has decline in memory and cognitive function since 07/2018. Per family, patient's medical providers has not addressed this issue and patient currently is not any medication for cognition or psychosis. Family reports that patient lives alone, but is checked on weekly by siblings who state that she has had auditory and visual hallucinations. She has not been paying her bills and not cleaning up after herself. Denies aggressive behavior. There is concern as the patient continues to live on her own and struggles with ADLs.    Despite family member's account, patient has had neurologic evaluation for memory issues and PCP has documented MCI.  Past Medical History:  Diagnosis Date  . Achilles tendonitis   . Anemia, iron deficiency 07/23/2012  . Asthma   . Cancer ([facility]) 05/2012   plasma cell myeloma  . GERD (gastroesophageal reflux disease)   . Hyperlipidemia   . Hypertension   . Multiple sclerosis ([facility])   . OA (osteoarthritis) of knee   . Obesity   . 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 S/P radiation therapy 08/12/12 - 09/21/12   left Anterior 2nd Rib Lesion / 50.4 GY / 28 Fractions  . Vertigo   . Vitamin D  deficiency     Patient Active Problem List   Diagnosis Date Noted  . Pericardial effusion 03/01/2018  . Valvular heart disease 12/29/2017  . Abdominal pain 11/03/2017  . MCI (mild cognitive impairment) 10/23/2016  . Lytic bone lesions on xray 06/03/2016  . Primary osteoarthritis of both knees 10/12/2014  . B12 deficiency 10/12/2014  . 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 Hyperlipidemia   . Vertigo   . Vitamin D deficiency   . Obesity   . Anemia, iron deficiency 07/23/2012  . 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 Rib lesion 04/28/2012  . Hypertension 06/05/2011  . Asthma 06/05/2011  . GERD (gastroesophageal reflux disease) 06/05/2011  . MS (multiple sclerosis) ([facility]) 06/05/2011    Past Surgical History:  Procedure Laterality Date  . ABDOMINAL HYSTERECTOMY     fibroids  . BONE BIOPSY  05/17/12   Left 2nd Rib, Plasma Cell Myeloma  . BREAST EXCISIONAL BIOPSY Left    benign  . BREAST SURGERY     left breast biopsy-benign  . CHOLECYSTECTOMY     lap choley  . ROTATOR CUFF REPAIR       OB History   None      Home Medications    Prior to Admission medications   Medication Sig Start Date End Date Taking? Authorizing Provider  acetaminophen (TYLENOL) 500 MG tablet Take 1,000 mg by mouth every 6 (six) hours as needed for moderate pain.   Yes [provider]  albuterol (  PROVENTIL HFA;VENTOLIN HFA) 108 (90 Base) MCG/ACT inhaler Inhale 2 puffs into the lungs every 6 (six) hours as needed (cough, shortness of breath or wheezing.). 12/05/15  Yes Daub, Loura Back, MD  amLODipine (NORVASC) 10 MG tablet Take 1 tablet (10 mg total) by mouth daily. 03/10/17  Yes Burns, Claudina Lick, MD  aspirin 81 MG tablet Take 81 mg by mouth daily.   Yes [provider]  atorvastatin (LIPITOR) 10 MG tablet Take 1 tablet (10 mg total) by mouth daily. 02/19/18  Yes Burns, Claudina Lick, MD  cholecalciferol (VITAMIN D) 1000 units tablet Take 1,000 Units by mouth daily.   Yes [provider]    COMBIGAN 0.2-0.5 % ophthalmic solution Place 1 drop into both eyes daily.  01/19/18  Yes [provider]  losartan (COZAAR) 50 MG tablet Take 50 mg by mouth daily.   Yes [provider]  ranitidine (ZANTAC) 150 MG tablet TAKE ONE TABLET BY MOUTH TWICE DAILY Patient taking differently: Take 150 mg by mouth 2 (two) times daily.  04/23/17  Yes Burns, Claudina Lick, MD  telmisartan (MICARDIS) 80 MG tablet Take 1 tablet (80 mg total) by mouth daily. 11/03/17  Yes Binnie Rail, MD    Family History Family History  Problem Relation Age of Onset  . Dementia Mother   . Hypertension Mother   . Lung cancer Father        +tobacco  . Diabetes Sister   . Colon cancer Brother        youngest brother  . Diabetes Sister        sister # 16  . Diabetes Brother   . Stomach cancer Neg Hx   . Pancreatic cancer Neg Hx     Social History Social History   Tobacco Use  . Smoking status: Former Smoker    Packs/day: 0.10    Years: 2.00    Pack years: 0.20    Types: Cigarettes    Start date: 10/05/1976    Last attempt to quit: 04/19/1979    Years since quitting: 39.0  . Smokeless tobacco: Never Used  . Tobacco comment: quit 35 years ago   Substance Use Topics  . Alcohol use: No    Alcohol/week: 0.0 standard drinks  . Drug use: No     Allergies   Celebrex [celecoxib]; Dristan; Robitussin (alcohol free) [guaifenesin]; Ropinirole hcl; and Tramadol   Review of Systems Review of Systems  Unable to perform ROS: Dementia     Physical Exam Updated Vital Signs BP 135/88 (BP Location: Right Arm)   Pulse (!) 54   Temp 98.4 F (36.9 C) (Oral)   Resp 18   Ht 5\' 2"  (1.575 m)   Wt 87.5 kg   SpO2 98%   BMI 35.30 kg/m   Physical Exam  Constitutional: She appears well-developed and well-nourished. She is cooperative.  Eyes: Pupils are equal, round, and reactive to light. EOM are normal.  Cardiovascular: Normal rate, regular rhythm and normal heart sounds.  Pulmonary/Chest: Effort  normal and breath sounds normal.  Abdominal: Soft. Normal appearance and bowel sounds are normal. She exhibits no distension. There is no tenderness.  Neurological: She is alert. She has normal strength. She is disoriented. No cranial nerve deficit or sensory deficit. She exhibits normal muscle tone.  Reflex Scores:      Bicep reflexes are 2+ on the right side and 2+ on the left side.      Brachioradialis reflexes are 2+ on the right side and  2+ on the left side. Oriented to person only.  Skin: Skin is warm. Capillary refill takes less than 2 seconds.  Psychiatric: She has a normal mood and affect. Her speech is normal and behavior is normal. Thought content normal. She is not actively hallucinating. Cognition and memory are impaired. She exhibits abnormal recent memory and abnormal remote memory.  Nursing note and vitals reviewed.  ED Treatments / Results  Labs (all labs ordered are listed, but only abnormal results are displayed) Labs Reviewed  COMPREHENSIVE METABOLIC PANEL - Abnormal; Notable for the following components:      Result Value   Sodium 147 (*)    Creatinine, Ser 1.18 (*)    GFR calc non Af Amer 47 (*)    GFR calc Af Amer 54 (*)    All other components within normal limits  CBC - Abnormal; Notable for the following components:   RBC 3.75 (*)    Hemoglobin 11.5 (*)    HCT 35.4 (*)    All other components within normal limits  URINALYSIS, ROUTINE W REFLEX MICROSCOPIC - Abnormal; Notable for the following components:   APPearance HAZY (*)    Hgb urine dipstick SMALL (*)    Leukocytes, UA SMALL (*)    Bacteria, UA RARE (*)    All other components within normal limits  LIPASE, BLOOD    EKG None  Radiology Dg Chest 2 View  Result Date: 04/10/2018 CLINICAL DATA:  67 year old female with history of hallucinations. EXAM: CHEST - 2 VIEW COMPARISON:  Chest x-ray 01/29/2016. FINDINGS: Lung volumes are normal. No consolidative airspace disease. No pleural effusions. No  pneumothorax. No pulmonary nodule or mass noted. Pulmonary vasculature and the cardiomediastinal silhouette are within normal limits. Atherosclerosis in the thoracic aorta. Expansile area in the anterior aspect of the left second rib, similar to numerous prior examinations, potentially an old healed fracture or focus of fibrous dysplasia. Surgical clips projecting over the right upper quadrant of the abdomen, likely from prior cholecystectomy. IMPRESSION: 1.  No radiographic evidence of acute cardiopulmonary disease. 2. Aortic atherosclerosis. Electronically Signed   By: Vinnie Langton M.D.   On: 04/10/2018 21:54   Ct Head Wo Contrast  Result Date: 04/10/2018 CLINICAL DATA:  Patient talking to people that do not exist. EXAM: CT HEAD WITHOUT CONTRAST TECHNIQUE: Contiguous axial images were obtained from the base of the skull through the vertex without intravenous contrast. COMPARISON:  MRI brain 09/23/2017 FINDINGS: Brain: Ventricles and sulci are appropriate for patient's age. Periventricular and subcortical white matter hypodensity compatible with chronic microvascular ischemic changes. No evidence for acute cortically based infarct, intracranial hemorrhage, mass lesion or mass-effect. Vascular: Unremarkable Skull: Intact. Sinuses/Orbits: Paranasal sinuses are well aerated. Mastoid air cells unremarkable. Orbits are unremarkable. Other: None. IMPRESSION: No acute intracranial process. Atrophy and chronic microvascular ischemic changes. Electronically Signed   By: Lovey Newcomer M.D.   On: 04/10/2018 22:03    Procedures Procedures (including critical care time)  Medications Ordered in ED Medications - No data to display   Initial Impression / Assessment and Plan / ED Course  Triage vital signs and the nursing notes have been reviewed.  Pertinent labs & imaging results that were available during care of the patient were reviewed and considered in medical decision making (see chart for  details).  Patient presents to the ED with her adult daughter with concerns over the patient's decline in memory and cognitive function. Per family history, significant changes in memory and cognition have occurred since 07/2017.  Patient lives alone despite family noticing that she is unable to perform ADLs and stopped paying bills. She also has auditory and visual hallucinations regularly. Patient currently not on any psychiatric medications to assist with the AVH or any medications like Namenda or Aricept. Given pt's social situation, lack of psychiatric intervention and signficant decline over the last 10 months, will consult TTS to discuss possible geri psychiatric placement for further evaluation and treatment.  Clinical Course as of Apr 10 2256  Sat Apr 10, 2018  2211 CXR normal. CT Head shows chronic ischemic changes and cerebral atrophy. No acute or subacute findings to explain decline in cognitive function. Remaining labs unremarkable. No clear infectious or metabolic etiology to patient's cognitive decline.   [GM]  2221 Per Dr. Theotis Burrow recommendation, TTS consult placed for geri psych hospitalization.    [GM]    Clinical Course User Index [GM] Ziair Penson, Jonelle Sports, PA-C    Final Clinical Impressions(s) / ED Diagnoses  1. Dementia with Behavioral Disturbances. TTS consult placed.  Dispo: Pending TTS recommendation.   Final diagnoses:  Dementia with behavioral disturbance, unspecified dementia type    ED Discharge Orders    None        Junita Push 04/10/18 2257    Little, Wenda Overland, MD 04/13/18 1020

## 2018-04-11 ENCOUNTER — Other Ambulatory Visit: Payer: Self-pay

## 2018-04-11 DIAGNOSIS — F0391 Unspecified dementia with behavioral disturbance: Secondary | ICD-10-CM

## 2018-04-11 DIAGNOSIS — F03918 Unspecified dementia, unspecified severity, with other behavioral disturbance: Secondary | ICD-10-CM | POA: Diagnosis present

## 2018-04-11 MED ORDER — AMLODIPINE BESYLATE 5 MG PO TABS
10.0000 mg | ORAL_TABLET | Freq: Every day | ORAL | Status: DC
  Administered 2018-04-11: 10 mg via ORAL
  Filled 2018-04-11: qty 2

## 2018-04-11 MED ORDER — IRBESARTAN 75 MG PO TABS: 75.0000 mg | ORAL_TABLET | Freq: Every day | ORAL | Status: DC

## 2018-04-11 MED ORDER — ASPIRIN EC 81 MG PO TBEC
81.0000 mg | DELAYED_RELEASE_TABLET | Freq: Every day | ORAL | Status: DC
  Administered 2018-04-11: 81 mg via ORAL
  Filled 2018-04-11: qty 1

## 2018-04-11 MED ORDER — FAMOTIDINE 20 MG PO TABS
20.0000 mg | ORAL_TABLET | Freq: Every day | ORAL | Status: DC
  Administered 2018-04-11: 20 mg via ORAL
  Filled 2018-04-11: qty 1

## 2018-04-11 MED ORDER — LOSARTAN POTASSIUM 50 MG PO TABS
50.0000 mg | ORAL_TABLET | Freq: Every day | ORAL | Status: DC
  Administered 2018-04-11: 50 mg via ORAL
  Filled 2018-04-11: qty 1

## 2018-04-11 MED ORDER — ACETAMINOPHEN 500 MG PO TABS: 1000.0000 mg | ORAL_TABLET | Freq: Four times a day (QID) | ORAL | Status: DC | PRN

## 2018-04-11 MED ORDER — VITAMIN D3 25 MCG (1000 UNIT) PO TABS
1000.0000 [IU] | ORAL_TABLET | Freq: Every day | ORAL | Status: DC
  Administered 2018-04-11: 1000 [IU] via ORAL
  Filled 2018-04-11: qty 1

## 2018-04-11 MED ORDER — RISPERIDONE 0.5 MG PO TABS: 0.2500 mg | ORAL_TABLET | Freq: Once | ORAL | Status: DC

## 2018-04-11 NOTE — ED Notes (Addendum)
Pt was up getting dressed and walking around room. Pt stated that she was the Pt's friend and that she was going home. Redirected Pt and informed her that she was staying in ER tonight to be observed. Pt pleasant but confused at this time. Pt ambulated to bathroom with assistance and now is in bed.

## 2018-04-11 NOTE — BH Assessment (Addendum)
Tele Assessment Note   Patient Name: Morgan Espinoza MRN: [id number] Referring Physician: PA Alvie Heidelberg Location of Patient: [facility] ED Location of Provider: [place]  Morgan Espinoza is an 67 y.o. female.  The pt came in with in her daughter due to the pt having hallucinations.  The pt's daughter reported the pt will start talking when no one else is in the room and then someone was in the room, such as her sister.  The pt's daughter stated the sister the pt mentioned lives in [place].  According to the daughter, the pt isn't washing her dishes and has had trouble remembering to pay her bills, which isn't normal for the pt.  When asked her birthday, the pt paused and then asked her daughter her birthday.  After pausing, the pt was able to state her birthday.  The pt was able to say the month, year and the president after pausing.  The pt has not had mental health treatment in the past.  The pt lives alone and has her siblings check on her.  The pt's daughter is agreeable to this arrangement.  The pt denies self harm, legal issues, and abuse.  The pt is not sleeping well at night according to the pt's daughter.  The pt has a good appetite.  The pt denies SA and her UDS is negative for all substances.  Pt is dressed in scrubs. She is alert and oriented x4. Pt speaks in a clear tone, at moderate volume and normal pace. Eye contact is good. Pt's mood is pleasant. Thought process is coherent and relevant. There is no indication Pt is currently responding to internal stimuli or experiencing delusional thought content.?Pt was cooperative throughout assessment.       Diagnosis: F23 Brief psychotic disorder   Past Medical History:  Past Medical History:  Diagnosis Date  . Achilles tendonitis   . Anemia, iron deficiency 07/23/2012  . Asthma   . Cancer ([place]) 05/2012   plasma cell myeloma  . GERD (gastroesophageal reflux disease)   . Hyperlipidemia   . Hypertension   . Multiple  sclerosis ([place])   . OA (osteoarthritis) of knee   . Obesity   . 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 S/P radiation therapy 08/12/12 - 09/21/12   left Anterior 2nd Rib Lesion / 50.4 GY / 28 Fractions  . Vertigo   . Vitamin D deficiency     Past Surgical History:  Procedure Laterality Date  . ABDOMINAL HYSTERECTOMY     fibroids  . BONE BIOPSY  05/17/12   Left 2nd Rib, Plasma Cell Myeloma  . BREAST EXCISIONAL BIOPSY Left    benign  . BREAST SURGERY     left breast biopsy-benign  . CHOLECYSTECTOMY     lap choley  . ROTATOR CUFF REPAIR      Family History:  Family History  Problem Relation Age of Onset  . Dementia Mother   . Hypertension Mother   . Lung cancer Father        +tobacco  . Diabetes Sister   . Colon cancer Brother        youngest brother  . Diabetes Sister        sister # 51  . Diabetes Brother   . Stomach cancer Neg Hx   . Pancreatic cancer Neg Hx     Social History:  reports that she quit smoking about 39 years ago. Her smoking use included cigarettes. She started  smoking about 41 years ago. She has a 0.20 pack-year smoking history. She has never used smokeless tobacco. She reports that she does not drink alcohol or use drugs.  Additional Social History:  Alcohol / Drug Use Pain Medications: See MAR Prescriptions: See MAR Over the Counter: See MAR History of alcohol / drug use?: No history of alcohol / drug abuse Longest period of sobriety (when/how long): NA  CIWA: CIWA-Ar BP: (!) 161/100 Pulse Rate: 68 COWS:    Allergies:  Allergies  Allergen Reactions  . Celebrex [Celecoxib] Other (See Comments)    unknown  . Dristan Other (See Comments)    "made my chest tight"  . Robitussin (Alcohol Free) [Guaifenesin] Other (See Comments)    "made my chest tight"  . Ropinirole Hcl Other (See Comments)    "made my chest tight"  . Tramadol Other (See Comments)    "made my chest tight"    Home Medications:  (Not in a hospital  admission)  OB/GYN Status:  No LMP recorded. Patient has had a hysterectomy.  General Assessment Data Location of Assessment: [facility] ED TTS Assessment: In system Is this a Tele or Face-to-Face Assessment?: Face-to-Face Is this an Initial Assessment or a Re-assessment for this encounter?: Initial Assessment Patient Accompanied by:: Other(daughter) Language Other than English: No Living Arrangements: Other (Comment)(home) What gender do you identify as?: Female Marital status: Widowed [place] name: Morgan Espinoza Pregnancy Status: No Living Arrangements: Alone Can pt return to current living arrangement?: Yes Admission Status: Voluntary Is patient capable of signing voluntary admission?: Yes Referral Source: Self/Family/Friend Insurance type: Medicare     Crisis Care Plan Living Arrangements: Alone Legal Guardian: Other:(self) Name of Psychiatrist: none Name of Therapist: none  Education Status Is patient currently in school?: No Is the patient employed, unemployed or receiving disability?: Unemployed(retired)  Risk to self with the past 6 months Suicidal Ideation: No Has patient been a risk to self within the past 6 months prior to admission? : No Suicidal Intent: No Has patient had any suicidal intent within the past 6 months prior to admission? : No Is patient at risk for suicide?: No Suicidal Plan?: No Has patient had any suicidal plan within the past 6 months prior to admission? : No Access to Means: No What has been your use of drugs/alcohol within the last 12 months?: none Previous Attempts/Gestures: No How many times?: 0 Other Self Harm Risks: none Triggers for Past Attempts: None known Intentional Self Injurious Behavior: None Family Suicide History: No Recent stressful life event(s): Recent negative physical changes Persecutory voices/beliefs?: No Depression: No Depression Symptoms: (none) Substance abuse history and/or treatment for substance abuse?: No Suicide  prevention information given to non-admitted patients: Not applicable  Risk to Others within the past 6 months Homicidal Ideation: No Does patient have any lifetime risk of violence toward others beyond the six months prior to admission? : No Thoughts of Harm to Others: No Current Homicidal Intent: No Current Homicidal Plan: No Access to Homicidal Means: No Identified Victim: none History of harm to others?: No Assessment of Violence: None Noted Violent Behavior Description: none Does patient have access to weapons?: No Criminal Charges Pending?: No Does patient have a court date: No Is patient on probation?: No  Psychosis Hallucinations: Auditory, Visual Delusions: None noted  Mental Status Report Appearance/Hygiene: Unremarkable Eye Contact: Good Motor Activity: Unable to assess Speech: Logical/coherent Level of Consciousness: Alert Mood: Pleasant Affect: Appropriate to circumstance Anxiety Level: None Thought Processes: Relevant Judgement: Partial Orientation: Person, Place, Time,  Situation Obsessive Compulsive Thoughts/Behaviors: None  Cognitive Functioning Concentration: Normal Memory: Remote Intact, Recent Impaired Is patient IDD: No Insight: Fair Impulse Control: Fair Appetite: Good Have you had any weight changes? : No Change Sleep: Decreased Total Hours of Sleep: 3 Vegetative Symptoms: None  ADLScreening [facility] Assessment Services) Patient's cognitive ability adequate to safely complete daily activities?: Yes Patient able to express need for assistance with ADLs?: Yes Independently performs ADLs?: Yes (appropriate for developmental age)  Prior Inpatient Therapy Prior Inpatient Therapy: No  Prior Outpatient Therapy Prior Outpatient Therapy: No Does patient have an ACCT team?: No Does patient have Intensive In-House Services?  : No Does patient have [facility] services? : No Does patient have P4CC services?: No  ADL Screening (condition at time of  admission) Patient's cognitive ability adequate to safely complete daily activities?: Yes Patient able to express need for assistance with ADLs?: Yes Independently performs ADLs?: Yes (appropriate for developmental age)       Abuse/Neglect Assessment (Assessment to be complete while patient is alone) Abuse/Neglect Assessment Can Be Completed: Yes Physical Abuse: Denies Verbal Abuse: Denies Sexual Abuse: Denies Exploitation of patient/patient's resources: Denies Self-Neglect: Denies Values / Beliefs Cultural Requests During Hospitalization: None Spiritual Requests During Hospitalization: None Consults Spiritual Care Consult Needed: No Social Work Consult Needed: No Regulatory affairs officer (For Healthcare) Does Patient Have a Medical Advance Directive?: No Would patient like information on creating a medical advance directive?: No - Patient declined          Disposition:  Disposition Initial Assessment Completed for this Encounter: Yes   NP Lindon Romp recommends the pt be observed overnight for safety and stabilization.  The pt is to be reassessed by psychiatry in the morning.  PA Dorothea Ogle was made aware of the recommendations.   This service was provided via telemedicine using a 2-way, interactive audio and video technology.  Names of all persons participating in this telemedicine service and their role in this encounter. Name: Virgina Organ Role: TTS  Name: Brandon Melnick Role: Pt's daughter  Name: Rechy Bost Role: Pt  Name:  Role:     Enzo Montgomery 04/11/2018 12:21 AM

## 2018-04-11 NOTE — ED Notes (Signed)
Pt's daughter Brandon Melnick [phone]

## 2018-04-11 NOTE — Care Management Note (Addendum)
Case Management Note  Patient Details  Name: Mikaelyn Arthurs MRN: [id number] Date of Birth: 25-Jan-1951  Subjective/Objective:  Dementia with Behavioral Disturbances                  Action/Plan: NCM spoke to pt and dtr, [place] at bedside. Dtr lives in [place] and has requested pt come to live with her in [place]. Pt currently lives in home alone and has been independent prior to ED visit. Able to manage cooking, paying her own bills and taking meds. Dtr has concerns that pt has not taken meds lately as prescribed due to forgetfulness. Pt has two brothers that check on her during the day. Dtr states she has checked into ALF in the area but due to cost pt cannot afford without assistance. Explained she can check to see if she qualifies for Medicaid. Pt is resistant to ALF and living with brother's here in [place]. Explained due to her dtr's safety concerns she will need to make a decision in the upcoming weeks. Offered choice for HH/list provided. Dtr agreeable to [facility] for [facility]. Pt ambulatory and no DME needed. Contacted [facility] Lemmings with new referral.   Pt's daughter Brandon Melnick [phone].  Expected Discharge Date:                  Expected Discharge Plan:  [place]  In-House Referral:  Clinical Social Work  Discharge planning Services  CM Consult  Post Acute Care Choice:  Home Health Choice offered to:  Patient, Adult Children  DME Arranged:  N/A DME Agency:  NA  HH Arranged:  RN, PT, OT, Nurse's Aide, Social Work [employer] Agency:  [facility]  Status of Service:  Completed, signed off  If discussed at [employer], dates discussed:    Additional Comments:  Erenest Rasher, RN 04/11/2018, 2:20 PM

## 2018-04-11 NOTE — ED Provider Notes (Signed)
TTS has psychiatrically cleared the patient.  I spoke with [place] with social work, will place order for home health evaluation.  From her standpoint she is stable for discharge home at this time.   Renita Papa, PA-C 04/11/18 1223    Lacretia Leigh, MD 04/11/18 [phone]

## 2018-04-11 NOTE — ED Provider Notes (Signed)
TTS recommends that patient overnight observation with reevaluation in the a.m.  Patient was discussed with attending and prior provider.  If TTS is able to clear patient is morning she needs Education officer, museum and case manager for possible in-home help vs placement for worsening dementia.   Doristine Devoid, PA-C 04/11/18 0527    Rex Kras Wenda Overland, MD 04/12/18 2002

## 2018-04-11 NOTE — Discharge Instructions (Signed)
Follow up with your outpatient providers already in place:  [facility] [place], [place] [address] [phone] Please make an appointment for MS and a full dementia evaluation  Billey Gosling, MD [place], [place] [address] [phone]

## 2018-04-11 NOTE — ED Notes (Signed)
ED Provider at bedside. 

## 2018-04-11 NOTE — ED Notes (Signed)
Discharge instructions reviewed with daughter and patient. Patient being discharged to home

## 2018-04-11 NOTE — Consult Note (Addendum)
[facility] Psych ED Discharge  04/11/2018 1:21 PM Albana Saperstein  MRN:  [id number] Principal Problem: Dementia with behavioral disturbance Discharge Diagnoses:  Patient Active Problem List   Diagnosis Date Noted  . Dementia with behavioral disturbance [F03.91]   . Pericardial effusion [I31.3] 03/01/2018  . Valvular heart disease [I38] 12/29/2017  . Abdominal pain [R10.9] 11/03/2017  . MCI (mild cognitive impairment) [G31.84] 10/23/2016  . Lytic bone lesions on xray [M89.9] 06/03/2016  . Primary osteoarthritis of both knees [M17.0] 10/12/2014  . B12 deficiency [E53.8] 10/12/2014  . 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 Hyperlipidemia [E78.5]   . Vertigo [R42]   . Vitamin D deficiency [E55.9]   . Obesity [E66.9]   . Anemia, iron deficiency [D50.9] 07/23/2012  . Plasma cell myeloma ([place]) [C90.00] 05/17/2012  . Rib lesion [M89.9] 04/28/2012  . Hypertension [I10] 06/05/2011  . Asthma [J45.909] 06/05/2011  . GERD (gastroesophageal reflux disease) [K21.9] 06/05/2011  . MS (multiple sclerosis) ([place]) [G35] 06/05/2011    Subjective: Pt was seen and chart reviewed with treatment team and Dr Darleene Cleaver. Pt denies suicidal/homicidal ideation, denies auditory/visual hallucinations and does not appear to be responding to internal stimuli. Pt lives alone but according to her daughter she has been declining in her memory issues lately. Pt has a history of MS and sees Dr Marcial Pacas at [facility] and sees her PCP Billey Gosling, MD for her health needs. Pt has two brothers who live close by and check on her and one daughter who lives in [place]. Pt has stopped taking her Betaseron injection for her MS in either January or May, according to her family, and since then her memory has declined. Pt's UDS and BAL are negative and all other lab work is unremarkable. Pt's daughter stated she has been talking to her sister who is not there and the Pt stated she only talks about her because she is thinking about her.  Pt's daughter is agreeable to making follow up appointments for her mother and also following up with the Dr's to try and get her mother the in-home care needed to keep her safe in light of her declining memory. Pt is stable and psychiatrically clear for discharge.   Total Time spent with patient: 45 minutes  Past Psychiatric History: As above  Past Medical History:  Past Medical History:  Diagnosis Date  . Achilles tendonitis   . Anemia, iron deficiency 07/23/2012  . Asthma   . Cancer ([place]) 05/2012   plasma cell myeloma  . GERD (gastroesophageal reflux disease)   . Hyperlipidemia   . Hypertension   . Multiple sclerosis ([place])   . OA (osteoarthritis) of knee   . Obesity   . 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 S/P radiation therapy 08/12/12 - 09/21/12   left Anterior 2nd Rib Lesion / 50.4 GY / 28 Fractions  . Vertigo   . Vitamin D deficiency     Past Surgical History:  Procedure Laterality Date  . ABDOMINAL HYSTERECTOMY     fibroids  . BONE BIOPSY  05/17/12   Left 2nd Rib, Plasma Cell Myeloma  . BREAST EXCISIONAL BIOPSY Left    benign  . BREAST SURGERY     left breast biopsy-benign  . CHOLECYSTECTOMY     lap choley  . ROTATOR CUFF REPAIR     Family History:  Family History  Problem Relation Age of Onset  . Dementia Mother   . Hypertension Mother   . Lung cancer Father        +  tobacco  . Diabetes Sister   . Colon cancer Brother        youngest brother  . Diabetes Sister        sister # 55  . Diabetes Brother   . Stomach cancer Neg Hx   . Pancreatic cancer Neg Hx    Family Psychiatric  History: Unknown Social History:  Social History   Substance and Sexual Activity  Alcohol Use No  . Alcohol/week: 0.0 standard drinks     Social History   Substance and Sexual Activity  Drug Use No    Social History   Socioeconomic History  . Marital status: Widowed    Spouse name: Not on file  . Number of children: 1  . Years of education: 3  .  Highest education level: Not on file  Occupational History    Employer: RETIRED  . Occupation: Retired   Scientific laboratory technician  . Financial resource strain: Not hard at all  . Food insecurity:    Worry: Never true    Inability: Never true  . Transportation needs:    Medical: No    Non-medical: No  Tobacco Use  . Smoking status: Former Smoker    Packs/day: 0.10    Years: 2.00    Pack years: 0.20    Types: Cigarettes    Start date: 10/05/1976    Last attempt to quit: 04/19/1979    Years since quitting: 39.0  . Smokeless tobacco: Never Used  . Tobacco comment: quit 35 years ago   Substance and Sexual Activity  . Alcohol use: No    Alcohol/week: 0.0 standard drinks  . Drug use: No  . Sexual activity: Never  Lifestyle  . Physical activity:    Days per week: 0 days    Minutes per session: 0 min  . Stress: Not at all  Relationships  . Social connections:    Talks on phone: More than three times a week    Gets together: More than three times a week    Attends religious service: More than 4 times per year    Active member of club or organization: Yes    Attends meetings of clubs or organizations: More than 4 times per year    Relationship status: Widowed  Other Topics Concern  . 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    Patient is retired.    Patient is widowed.    Patient is right handed.    Patient has a high school education.    Patient has 1 child.     Has this patient used any form of tobacco in the last 30 days? (Cigarettes, Smokeless Tobacco, Cigars, and/or Pipes) Prescription not provided because: Pt does not smoke  Current Medications: Current Facility-Administered Medications  Medication Dose Route Frequency Provider Last Rate Last Dose  . acetaminophen (TYLENOL) tablet 1,000 mg  1,000 mg Oral Q6H PRN Ocie Cornfield T, PA-C      . amLODipine (NORVASC) tablet 10 mg  10 mg Oral Daily Ocie Cornfield T,  PA-C   10 mg at 04/11/18 0915  . aspirin EC tablet 81 mg  81 mg Oral Daily Doristine Devoid, PA-C   81 mg at 04/11/18 0915  . cholecalciferol (VITAMIN D) tablet 1,000 Units  1,000 Units Oral Daily Doristine Devoid, PA-C   1,000 Units at 04/11/18 0915  . famotidine (PEPCID) tablet 20 mg  20 mg  Oral Daily Doristine Devoid, PA-C   20 mg at 04/11/18 0915  . losartan (COZAAR) tablet 50 mg  50 mg Oral Daily Ocie Cornfield T, PA-C   50 mg at 04/11/18 0915  . risperiDONE (RISPERDAL) tablet 0.25 mg  0.25 mg Oral Once Pollina, Gwenyth Allegra, MD       Current Outpatient Medications  Medication Sig Dispense Refill  . acetaminophen (TYLENOL) 500 MG tablet Take 1,000 mg by mouth every 6 (six) hours as needed for moderate pain.    Marland Kitchen albuterol (PROVENTIL HFA;VENTOLIN HFA) 108 (90 Base) MCG/ACT inhaler Inhale 2 puffs into the lungs every 6 (six) hours as needed (cough, shortness of breath or wheezing.). 1 Inhaler 5  . amLODipine (NORVASC) 10 MG tablet Take 1 tablet (10 mg total) by mouth daily. 90 tablet 3  . aspirin 81 MG tablet Take 81 mg by mouth daily.    Marland Kitchen atorvastatin (LIPITOR) 10 MG tablet Take 1 tablet (10 mg total) by mouth daily. 90 tablet 1  . cholecalciferol (VITAMIN D) 1000 units tablet Take 1,000 Units by mouth daily.    . COMBIGAN 0.2-0.5 % ophthalmic solution Place 1 drop into both eyes daily.   3  . losartan (COZAAR) 50 MG tablet Take 50 mg by mouth daily.    . ranitidine (ZANTAC) 150 MG tablet TAKE ONE TABLET BY MOUTH TWICE DAILY (Patient taking differently: Take 150 mg by mouth 2 (two) times daily. ) 180 tablet 3  . telmisartan (MICARDIS) 80 MG tablet Take 1 tablet (80 mg total) by mouth daily. 30 tablet 5    Musculoskeletal: Strength & Muscle Tone: within normal limits Gait & Station: normal Patient leans: N/A  Psychiatric Specialty Exam: Physical Exam  Constitutional: She appears well-developed and well-nourished.  HENT:  Head: Normocephalic.  Respiratory: Effort  normal.  Musculoskeletal: Normal range of motion.  Neurological: She is alert.  Psychiatric: Her speech is normal and behavior is normal. Thought content normal. Her mood appears not anxious. Cognition and memory are normal. She expresses impulsivity. She exhibits a depressed mood.    Review of Systems  Psychiatric/Behavioral: Positive for depression and memory loss. Negative for hallucinations, substance abuse and suicidal ideas. The patient is not nervous/anxious and does not have insomnia.   All other systems reviewed and are negative.   Blood pressure (!) 159/71, pulse 65, temperature 98.3 F (36.8 C), temperature source Oral, resp. rate 18, height 5\' 2"  (1.575 m), weight 87.5 kg, SpO2 96 %.Body mass index is 35.3 kg/m.  General Appearance: Casual  Eye Contact:  Good  Speech:  Clear and Coherent and Normal Rate  Volume:  Normal  Mood:  Depressed  Affect:  Congruent  Thought Process:  Coherent  Orientation:  Full (Time, Place, and Person)  Thought Content:  Illogical  Suicidal Thoughts:  No  Homicidal Thoughts:  No  Memory:  Immediate;   Good Recent;   Good Remote;   Fair  Judgement:  Fair  Insight:  Fair  Psychomotor Activity:  Normal  Concentration:  Concentration: Good and Attention Span: Fair  Recall:  Good  Fund of Knowledge:  Good  Language:  Good  Akathisia:  No  Handed:  Right  AIMS (if indicated):     Assets:  Agricultural consultant Housing  ADL's:  Intact  Cognition:  WNL  Sleep:        Demographic Factors:  Age 67 or older and Living alone  Loss Factors: Decline in physical health  Historical Factors: Impulsivity  Risk Reduction Factors:   Sense of responsibility to family  Continued Clinical Symptoms:  Depression:   Impulsivity Medical Diagnoses and Treatments/Surgeries  Cognitive Features That Contribute To Risk:  Closed-mindedness and Loss of executive function    Suicide Risk:  Minimal: No identifiable  suicidal ideation.  Patients presenting with no risk factors but with morbid ruminations; may be classified as minimal risk based on the severity of the depressive symptoms   Plan Of Care/Follow-up recommendations:  Activity:  as tolerated Diet:  Heart Healthy  Disposition: Take all medications as prescribed. Keep all follow-up appointments as scheduled.  See discharge instructions for MD's information Do not consume alcohol or use illegal drugs while on prescription medications. Report any adverse effects from your medications to your primary care provider promptly.  In the event of recurrent symptoms or worsening symptoms, call 911, a crisis hotline, or go to the nearest emergency department for evaluation.   Corena Pilgrim, MD 04/11/2018, 1:21 PM  Patient seen face-to-face for psychiatric evaluation, chart reviewed and case discussed with the physician extender and developed treatment plan. Reviewed the information documented and agree with the treatment plan. Corena Pilgrim, MD

## 2018-04-13 ENCOUNTER — Telehealth: Payer: Self-pay | Admitting: Internal Medicine

## 2018-04-13 DIAGNOSIS — J45909 Unspecified asthma, uncomplicated: Secondary | ICD-10-CM | POA: Diagnosis not present

## 2018-04-13 DIAGNOSIS — I10 Essential (primary) hypertension: Secondary | ICD-10-CM | POA: Diagnosis not present

## 2018-04-13 DIAGNOSIS — M170 Bilateral primary osteoarthritis of knee: Secondary | ICD-10-CM | POA: Diagnosis not present

## 2018-04-13 DIAGNOSIS — G35 Multiple sclerosis: Secondary | ICD-10-CM | POA: Diagnosis not present

## 2018-04-13 NOTE — Telephone Encounter (Signed)
Patient was triaged by team health on 8/31 at 3:11pm.  Patient states she stopped taking medication for MS.  But is confused if she was to truly stop this medication.  Also stated that she had fell a couple weeks ago.  Patient was advised to go to ED.

## 2018-04-13 NOTE — Telephone Encounter (Signed)
She was evaluated in the ED

## 2018-04-13 NOTE — Telephone Encounter (Signed)
Copied from [place] [phone]. Topic: General - Other >> Apr 13, 2018 11:49 AM Carolyn Stare wrote:  Agapito Games with [facility]mings would like a call back to discuss pt medications    [phone]

## 2018-04-14 DIAGNOSIS — I10 Essential (primary) hypertension: Secondary | ICD-10-CM | POA: Diagnosis not present

## 2018-04-14 DIAGNOSIS — M170 Bilateral primary osteoarthritis of knee: Secondary | ICD-10-CM | POA: Diagnosis not present

## 2018-04-14 DIAGNOSIS — J45909 Unspecified asthma, uncomplicated: Secondary | ICD-10-CM | POA: Diagnosis not present

## 2018-04-14 DIAGNOSIS — G35 Multiple sclerosis: Secondary | ICD-10-CM | POA: Diagnosis not present

## 2018-04-14 NOTE — Telephone Encounter (Addendum)
Spoke with [place] from [place]. She wanted to call and advise that pt was just seen in ED. Sxs of dementia have gotten worse. She is not taking medications like she is supposed to and she lives at home by herself. She advised the next step would be to get a Education officer, museum out there to see if they can help her get medicaid to get extra help in home. I gave verbal ok for Education officer, museum.

## 2018-04-15 ENCOUNTER — Telehealth: Payer: Self-pay | Admitting: Internal Medicine

## 2018-04-15 DIAGNOSIS — I10 Essential (primary) hypertension: Secondary | ICD-10-CM | POA: Diagnosis not present

## 2018-04-15 DIAGNOSIS — M170 Bilateral primary osteoarthritis of knee: Secondary | ICD-10-CM | POA: Diagnosis not present

## 2018-04-15 DIAGNOSIS — G35 Multiple sclerosis: Secondary | ICD-10-CM | POA: Diagnosis not present

## 2018-04-15 DIAGNOSIS — J45909 Unspecified asthma, uncomplicated: Secondary | ICD-10-CM | POA: Diagnosis not present

## 2018-04-15 NOTE — Telephone Encounter (Signed)
LVM giving verbal orders for what I can understand below. Asked him to call back if there were any orders I missed.

## 2018-04-15 NOTE — Telephone Encounter (Signed)
Copied from [place] [phone]. Topic: Quick Communication - See Telephone Encounter >> Apr 15, 2018 10:11 AM Sheran Luz wrote: CRM for notification. See Telephone encounter for: 04/15/18.  Tommi Emery from [place] requesting verbal orders for OT 1xw for 1w, 0xw for 1w. Adl transfer 1x for 1w iadl &dc goals met 4 at max potential.

## 2018-04-16 ENCOUNTER — Telehealth: Payer: Self-pay

## 2018-04-16 ENCOUNTER — Telehealth: Payer: Self-pay | Admitting: Internal Medicine

## 2018-04-16 NOTE — Telephone Encounter (Signed)
Copied from [place] [phone]. Topic: General - Other >> Apr 16, 2018  2:24 PM Judyann Munson wrote: Reason for CRM:    [place] to  request Speech therapy for  04-16-18 to 04-19-18  [phone]

## 2018-04-16 NOTE — Telephone Encounter (Signed)
Copied from [place] [phone]. Topic: General - Other >> Apr 16, 2018 11:57 AM Margot Ables wrote: Reason for CRM: PT eval completed 04/14/18. Requesting VO for PT 1x week for 4 weeks. Ok to leave detailed msg on secure VM if no answer.

## 2018-04-16 NOTE — Telephone Encounter (Signed)
Gave ok for verbal orders per provider

## 2018-04-16 NOTE — Telephone Encounter (Signed)
[name] given verbal orders to move speech therapy to 04/19/18.

## 2018-04-19 DIAGNOSIS — M170 Bilateral primary osteoarthritis of knee: Secondary | ICD-10-CM | POA: Diagnosis not present

## 2018-04-19 DIAGNOSIS — J45909 Unspecified asthma, uncomplicated: Secondary | ICD-10-CM | POA: Diagnosis not present

## 2018-04-19 DIAGNOSIS — G35 Multiple sclerosis: Secondary | ICD-10-CM | POA: Diagnosis not present

## 2018-04-19 DIAGNOSIS — I10 Essential (primary) hypertension: Secondary | ICD-10-CM | POA: Diagnosis not present

## 2018-04-20 DIAGNOSIS — E669 Obesity, unspecified: Secondary | ICD-10-CM

## 2018-04-20 DIAGNOSIS — Z9181 History of falling: Secondary | ICD-10-CM

## 2018-04-20 DIAGNOSIS — I10 Essential (primary) hypertension: Secondary | ICD-10-CM

## 2018-04-20 DIAGNOSIS — Z8579 Personal history of other malignant neoplasms of lymphoid, hematopoietic and related tissues: Secondary | ICD-10-CM

## 2018-04-20 DIAGNOSIS — G35 Multiple sclerosis: Secondary | ICD-10-CM | POA: Diagnosis not present

## 2018-04-20 DIAGNOSIS — M170 Bilateral primary osteoarthritis of knee: Secondary | ICD-10-CM | POA: Diagnosis not present

## 2018-04-20 DIAGNOSIS — E539 Vitamin B deficiency, unspecified: Secondary | ICD-10-CM

## 2018-04-20 DIAGNOSIS — F0281 Dementia in other diseases classified elsewhere with behavioral disturbance: Secondary | ICD-10-CM | POA: Diagnosis not present

## 2018-04-20 DIAGNOSIS — J45909 Unspecified asthma, uncomplicated: Secondary | ICD-10-CM | POA: Diagnosis not present

## 2018-04-20 DIAGNOSIS — E559 Vitamin D deficiency, unspecified: Secondary | ICD-10-CM

## 2018-04-21 DIAGNOSIS — J45909 Unspecified asthma, uncomplicated: Secondary | ICD-10-CM | POA: Diagnosis not present

## 2018-04-21 DIAGNOSIS — G35 Multiple sclerosis: Secondary | ICD-10-CM | POA: Diagnosis not present

## 2018-04-21 DIAGNOSIS — M170 Bilateral primary osteoarthritis of knee: Secondary | ICD-10-CM | POA: Diagnosis not present

## 2018-04-21 DIAGNOSIS — I10 Essential (primary) hypertension: Secondary | ICD-10-CM | POA: Diagnosis not present

## 2018-04-27 ENCOUNTER — Other Ambulatory Visit: Payer: Self-pay | Admitting: Internal Medicine

## 2018-04-27 DIAGNOSIS — J45909 Unspecified asthma, uncomplicated: Secondary | ICD-10-CM | POA: Diagnosis not present

## 2018-04-27 DIAGNOSIS — G35 Multiple sclerosis: Secondary | ICD-10-CM | POA: Diagnosis not present

## 2018-04-27 DIAGNOSIS — M170 Bilateral primary osteoarthritis of knee: Secondary | ICD-10-CM | POA: Diagnosis not present

## 2018-04-27 DIAGNOSIS — I10 Essential (primary) hypertension: Secondary | ICD-10-CM | POA: Diagnosis not present

## 2018-04-30 DIAGNOSIS — M170 Bilateral primary osteoarthritis of knee: Secondary | ICD-10-CM | POA: Diagnosis not present

## 2018-04-30 DIAGNOSIS — J45909 Unspecified asthma, uncomplicated: Secondary | ICD-10-CM | POA: Diagnosis not present

## 2018-04-30 DIAGNOSIS — G35 Multiple sclerosis: Secondary | ICD-10-CM | POA: Diagnosis not present

## 2018-04-30 DIAGNOSIS — I10 Essential (primary) hypertension: Secondary | ICD-10-CM | POA: Diagnosis not present

## 2018-05-03 ENCOUNTER — Telehealth: Payer: Self-pay | Admitting: Neurology

## 2018-05-03 ENCOUNTER — Telehealth: Payer: Self-pay

## 2018-05-03 NOTE — Telephone Encounter (Signed)
Patient's daughter Delois (on [place]) calling to discuss some things she would like addressed at patient's appointment on 05-06-18.

## 2018-05-03 NOTE — Telephone Encounter (Signed)
Copied from [place] [phone]. Topic: Inquiry >> Apr 30, 2018  9:32 AM Oliver Pila B wrote: Reason for CRM: [facility] called to add on orders for a previous order b/c they would like an additional visit; contact [phone]

## 2018-05-03 NOTE — Telephone Encounter (Signed)
Called [place] and spoke with Debbie. She wanted additional orders for social work. States she is worried about pts safety in the home and worried about her properly taking meds and taking care of herself as far as nutrition. I gave verbal orders for social work per Dr. Quay Burow.

## 2018-05-03 NOTE — Telephone Encounter (Signed)
Patient's daughter, who lives in [place], is concerned about her mother's worsening memory and wanted it further evaluated at her appt this week.

## 2018-05-04 DIAGNOSIS — I10 Essential (primary) hypertension: Secondary | ICD-10-CM | POA: Diagnosis not present

## 2018-05-04 DIAGNOSIS — M170 Bilateral primary osteoarthritis of knee: Secondary | ICD-10-CM | POA: Diagnosis not present

## 2018-05-04 DIAGNOSIS — J45909 Unspecified asthma, uncomplicated: Secondary | ICD-10-CM | POA: Diagnosis not present

## 2018-05-04 DIAGNOSIS — G35 Multiple sclerosis: Secondary | ICD-10-CM | POA: Diagnosis not present

## 2018-05-06 ENCOUNTER — Telehealth: Payer: Self-pay | Admitting: Neurology

## 2018-05-06 ENCOUNTER — Ambulatory Visit: Payer: Medicare Other | Admitting: Neurology

## 2018-05-06 ENCOUNTER — Encounter

## 2018-05-06 ENCOUNTER — Other Ambulatory Visit: Payer: Self-pay

## 2018-05-06 ENCOUNTER — Encounter: Payer: Self-pay | Admitting: Neurology

## 2018-05-06 VITALS — BP 157/93 | HR 79 | Resp 20 | Ht 62.0 in | Wt 188.0 lb

## 2018-05-06 DIAGNOSIS — R9089 Other abnormal findings on diagnostic imaging of central nervous system: Secondary | ICD-10-CM | POA: Insufficient documentation

## 2018-05-06 DIAGNOSIS — R413 Other amnesia: Secondary | ICD-10-CM

## 2018-05-06 MED ORDER — DONEPEZIL HCL 10 MG PO TABS: 10.0000 mg | ORAL_TABLET | Freq: Every day | ORAL | 11 refills | Status: DC

## 2018-05-06 MED ORDER — MEMANTINE HCL 10 MG PO TABS: 10.0000 mg | ORAL_TABLET | Freq: Two times a day (BID) | ORAL | 11 refills | Status: DC

## 2018-05-06 NOTE — Telephone Encounter (Signed)
Returned call to patient's daughter who was inquiring about any changes to her mother's treatment.  She is aware Dr. Krista Blue added both Namenda and Aricept to her daily medication regimen.

## 2018-05-06 NOTE — Progress Notes (Signed)
[facility]  PATIENT: Morgan Espinoza DOB: 1950-10-21  HISTORY OF PRESENT ILLNESS:[name] is a 67 years old right-handed female, follow-up for multiple sclerosis, her primary care physician is Dr. 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  She has been treated with Betaseron injection since the diagnosis in 2007, she tolerates it well, no significant side effect, she has not had flareup for many years.  She was a patient of Dr. Erling Cruz. She also takes medicine for hypertension, hyperlipidemia, iron supplements, stomach problems, Laboratory evaluations in 2017 there was no significant abnormality in CMP, CBC, ferritin level, TSH   UPDATE Apr 22 2016:  She retired from school system, no new symptoms from [place],  She also has a diagnosis of plasma cells myeloma, I have personally reviewed CT chest in September 2013, chest x-ray in June 2013, she has stable second rib expansile lytic lesion. She is receiving monthly treatment at [facility].  She apparently has some memory trouble, could not elaborate on the details,  she lives by herself, she denies significant difficulty driving, just retired as a Teacher, early years/pre for [employer].  I reviewed her previous oncologist note from Dr.Ennever, she was receiving zometa monthly and iv iron infusion as needed. We have personally reviewed MRI of the brain with and without contrast on March 03 2016, multiple T-2/flair hyperdensity paraventricular subcortical white matters to her relations at both hemisphere and in the pons, there are many microhemorrhage within the subcortical and deep white matter of the left frontal and temporal lobes, there was no acute findings,  she denied a family history of similar disease. Her mother has dementia in her elderly age, died at age 67.  She has one daughter lives in [place].  Reviewed laboratory since 2017, ferritin 883 iron 58, TIBC 242,normal CMP,LDL 70,  cholesterol 188, hepatitis C antibody negative,normal CBC, hemoglobin 11 point 8, igG   Kappa free light chain was elevated 25.4 Normal B12 520 in January 2016,low vitamin D level 20  in July 2014, normal TSH 0.77  Update September 01, 2017 She retired now, continue use Betaseron every other day, tolerating it well, there is no relapsing remitting episode,  She complains of gradual onset of mild memory loss, lives alone, drives without getting lost, today's Mini-Mental status is 24/30,  We have personally reviewed MRI of the brain without contrast in July 2017, extensive supratentorium lesions, periventricular, deep and subcortical white matter, and in the pons.  Which also raise the possibility of small vessel disease, there is evidence of many microhemorrhage within the subcortical and deep white matter of left frontal and temporal lobes,  UPDATE Sept 26 2019: She is with her brother Morgan Espinoza at visit, who sees her few times each week, she lives alone, she is no longer driving, no family history of memory loss.  She has stopped her betaseron injection since Jan 2019, there was no change,   Personally reviewed MRI brain w/wo in Feb 2019:  showing multiple scattered discrete and confluent periventricular and subcortical and to a lesser extent brainstem white matter hyperintensities which may be seen in a variety of conditions including chronic demyelinating disease or chronic small vessel disease or CADASIL. The presence of also several tiny  scattered microhemorrhages favors small vessel disease. No enhancing lesions are noted. Overall no significant change compared with the MRI dated 03/03/2016  Laboratory evaluation showed normal or negative B12, Vit D, TSH, CMP, CBC, hg  11.2    REVIEW OF SYSTEMS: Full 14 system review of systems performed and notable only for those listed, all others are neg:  As above   ALLERGIES: Allergies  Allergen Reactions  . Celebrex [Celecoxib] Other (See Comments)    unknown  . Dristan Other (See Comments)    "made my chest tight"  . Robitussin (Alcohol Free) [Guaifenesin] Other (See Comments)    "made my chest tight"  . Ropinirole Hcl Other (See Comments)    "made my chest tight"  . Tramadol Other (See Comments)    "made my chest tight"    HOME MEDICATIONS: Outpatient Medications Prior to Visit  Medication Sig Dispense Refill  . acetaminophen (TYLENOL) 500 MG tablet Take 1,000 mg by mouth every 6 (six) hours as needed for moderate pain.    Marland Kitchen albuterol (PROVENTIL HFA;VENTOLIN HFA) 108 (90 Base) MCG/ACT inhaler Inhale 2 puffs into the lungs every 6 (six) hours as needed (cough, shortness of breath or wheezing.). 1 Inhaler 5  . amLODipine (NORVASC) 10 MG tablet TAKE 1 TABLET BY MOUTH ONCE DAILY 90 tablet 1  . aspirin 81 MG tablet Take 81 mg by mouth daily.    Marland Kitchen atorvastatin (LIPITOR) 10 MG tablet Take 1 tablet (10 mg total) by mouth daily. 90 tablet 1  . COMBIGAN 0.2-0.5 % ophthalmic solution Place 1 drop into both eyes daily.   3  . losartan (COZAAR) 50 MG tablet Take 50 mg by mouth daily.    Marland Kitchen losartan (COZAAR) 50 MG tablet TAKE 1 TABLET BY MOUTH ONCE DAILY 90 tablet 1  . ranitidine (ZANTAC) 150 MG tablet TAKE 1 TABLET BY MOUTH TWICE DAILY 180 tablet 1  . telmisartan (MICARDIS) 80 MG tablet TAKE 1 TABLET BY MOUTH ONCE DAILY 90 tablet 1  . cholecalciferol (VITAMIN D) 1000 units tablet Take 1,000 Units by mouth daily.     No facility-administered medications prior to visit.     PAST MEDICAL HISTORY: Past Medical History:  Diagnosis Date  . Achilles tendonitis   . Anemia, iron deficiency 07/23/2012  . Asthma   . Cancer ([place]) 05/2012   plasma cell myeloma  . GERD (gastroesophageal reflux disease)   .  Hyperlipidemia   . Hypertension   . Multiple sclerosis ([place])   . OA (osteoarthritis) of knee   . Obesity   . 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 S/P radiation therapy 08/12/12 - 09/21/12   left Anterior 2nd Rib Lesion / 50.4 GY / 28 Fractions  . Vertigo   . Vitamin D deficiency     PAST SURGICAL HISTORY: Past Surgical History:  Procedure Laterality Date  . ABDOMINAL HYSTERECTOMY     fibroids  . BONE BIOPSY  05/17/12   Left 2nd Rib, Plasma Cell Myeloma  . BREAST EXCISIONAL BIOPSY Left    benign  . BREAST SURGERY     left breast biopsy-benign  . CHOLECYSTECTOMY     lap choley  . ROTATOR CUFF REPAIR      FAMILY HISTORY: Family History  Problem Relation Age of Onset  . Dementia Mother   . Hypertension Mother   . Lung cancer Father        +tobacco  . Diabetes Sister   . Colon cancer Brother  youngest brother  . Diabetes Sister        sister # [phone]  . Diabetes Brother   . Stomach cancer Neg Hx   . Pancreatic cancer Neg Hx     SOCIAL HISTORY: Social History   Socioeconomic History  . Marital status: Widowed    Spouse name: Not on file  . Number of children: 1  . Years of education: 26  . Highest education level: Not on file  Occupational History    Employer: RETIRED  . Occupation: Retired   Scientific laboratory technician  . Financial resource strain: Not hard at all  . Food insecurity:    Worry: Never true    Inability: Never true  . Transportation needs:    Medical: No    Non-medical: No  Tobacco Use  . Smoking status: Former Smoker    Packs/day: 0.10    Years: 2.00    Pack years: 0.20    Types: Cigarettes    Start date: 10/05/1976    Last attempt to quit: 04/19/1979    Years since quitting: 39.0  . Smokeless tobacco: Never Used  . Tobacco comment: quit 35 years ago   Substance and Sexual Activity  . Alcohol use: No    Alcohol/week: 0.0 standard drinks  . Drug use: No  . Sexual activity: Never  Lifestyle  . Physical activity:    Days per week:  0 days    Minutes per session: 0 min  . Stress: Not at all  Relationships  . Social connections:    Talks on phone: More than three times a week    Gets together: More than three times a week    Attends religious service: More than 4 times per year    Active member of club or organization: Yes    Attends meetings of clubs or organizations: More than 4 times per year    Relationship status: Widowed  . Intimate partner violence:    Fear of current or ex partner: Not on file    Emotionally abused: Not on file    Physically abused: Not on file    Forced sexual activity: Not on file  Other Topics Concern  . 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    Patient is retired.    Patient is widowed.    Patient is right handed.    Patient has a high school education.    Patient has 1 child.      PHYSICAL EXAM  Vitals:   05/06/18 1124  BP: (!) 157/93  Pulse: 79  Resp: 20  Weight: 188 lb (85.3 kg)  Height: 5\' 2"  (1.575 m)   Body mass index is 34.39 kg/m.  Generalized: Well developed, Obese female in no acute distress   Head: normocephalic and atraumatic,. Oropharynx benign  Neck: Supple, Musculoskeletal: No deformity   Neurological examination   Mentation: Alert MMSE not repeated MMSE - Mini Mental State Exam 05/06/2018 02/16/2018 09/01/2017  Orientation to time 1 4 4   Orientation to Place 5 4 5   Registration 3 3 3   Attention/ Calculation 3 3 3   Recall 0 0 0  Language- name 2 objects 2 2 2   Language- repeat 1 1 1   Language- follow 3 step command 3 3 3   Language- read & follow direction 1 1 1   Write a sentence 1 1 1   Copy design 1 1 1   Total score 21 23  24  Animal naming 9  Cranial nerve II-XII: Pupils were equal round reactive to light extraocular movements were full, visual field were full on confrontational test. Facial sensation and strength were normal. hearing was intact to finger rubbing bilaterally.  Uvula tongue midline. head turning and shoulder shrug were normal and symmetric.Tongue protrusion into cheek strength was normal. Motor: normal bulk and tone, full strength in the BUE, BLE, fine finger movements normal, no pronator drift. No focal weakness Sensory: normal and symmetric to light touch, pinprick, and  Vibration, in the upper and lower extremities Coordination: finger-nose-finger, heel-to-shin bilaterally, no dysmetria, no tremor Reflexes: Brachioradialis 2/2, biceps 2/2, triceps 2/2, patellar 2/2, Achilles 2/2, plantar responses were flexor bilaterally. Gait and Station: Rising up from seated position by pushing on chair arm, normal stance,  moderate stride, good arm swing, smooth turning, able to perform tiptoe, and heel walking without difficulty. Tandem gait is mildly unsteady. No assistive device  DIAGNOSTIC DATA (LABS, IMAGING, TESTING) - I reviewed patient records, labs, notes, testing and imaging myself where available.  Lab Results  Component Value Date   WBC 5.0 04/10/2018   HGB 11.5 (L) 04/10/2018   HCT 35.4 (L) 04/10/2018   MCV 94.4 04/10/2018   PLT 239 04/10/2018      Component Value Date/Time   NA 147 (H) 04/10/2018 1857   NA 144 05/28/2017 1348   NA 146 (H) 05/07/2017 0907   NA 144 11/17/2016 1049   K 3.6 04/10/2018 1857   K 3.9 05/07/2017 0907   K 3.8 11/17/2016 1049   CL 108 04/10/2018 1857   CL 108 05/07/2017 0907   CO2 30 04/10/2018 1857   CO2 29 05/07/2017 0907   CO2 26 11/17/2016 1049   GLUCOSE 87 04/10/2018 1857   GLUCOSE 95 05/07/2017 0907   BUN 17 04/10/2018 1857   BUN 17 05/28/2017 1348   BUN 13 05/07/2017 0907   BUN 19.7 11/17/2016 1049   CREATININE 1.18 (H) 04/10/2018 1857   CREATININE 1.3 (H) 05/07/2017 0907   CREATININE 0.8 11/17/2016 1049   CALCIUM 9.6 04/10/2018 1857   CALCIUM 8.9 05/07/2017 0907   CALCIUM 9.1 11/17/2016 1049   PROT 7.5 04/10/2018 1857   PROT 6.8 05/28/2017 1348   PROT 7.1 05/07/2017 0907   PROT 7.2  11/17/2016 1049   ALBUMIN 3.7 04/10/2018 1857   ALBUMIN 4.3 05/28/2017 1348   ALBUMIN 3.6 11/17/2016 1049   AST 30 04/10/2018 1857   AST 40 (H) 05/07/2017 0907   AST 30 11/17/2016 1049   ALT 24 04/10/2018 1857   ALT 32 05/07/2017 0907   ALT 26 11/17/2016 1049   ALKPHOS 61 04/10/2018 1857   ALKPHOS 73 05/07/2017 0907   ALKPHOS 81 11/17/2016 1049   BILITOT 0.4 04/10/2018 1857   BILITOT 0.2 05/28/2017 1348   BILITOT 0.60 05/07/2017 0907   BILITOT 0.38 11/17/2016 1049   GFRNONAA 47 (L) 04/10/2018 1857   GFRNONAA >89 01/10/2016 1148   GFRAA 54 (L) 04/10/2018 1857   GFRAA >89 01/10/2016 1148   Lab Results  Component Value Date   CHOL 223 (H) 02/16/2018   HDL 78.40 02/16/2018   LDLCALC 129 (H) 02/16/2018   TRIG 78.0 02/16/2018   CHOLHDL 3 02/16/2018    Lab Results  Component Value Date   VITAMINB12 446 09/01/2017    ASSESSMENT AND PLAN  67 y.o. 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  Most recent MRI in Feb 2019 showed multiple scattered discrete and confluent periventricular and subcortical and to a lesser extent brainstem white matter hyperintensities which may be seen in a variety of conditions including chronic demyelinating disease or chronic small vessel disease or CADASIL. The presence of also several tiny scattered microhemorrhages favors small vessel disease. No enhancing lesions are noted. Overall no significant change compared with the MRI dated 03/03/2016  She does has vascular risk factor of aging, hypertension, hyperlipidemia,     Continue aspirin daily   Mild cognitive impairment  Mini-Mental Status Examination 21/30,  Laboratory evaluation showed no treatable etiology  Add on Namenda 10mg  bid and Aricept 10mg  daily  Marcial Pacas, M.D. Ph.D.   [facility] [place], [place] [address] Phone: [phone] Fax:      [phone]

## 2018-05-06 NOTE — Telephone Encounter (Signed)
Pts daughter Delois(on lunch until 2:30)[phone] requesting a call, stating she would like to know what was discuss at the appt and what are the pts next steps please advise.

## 2018-05-07 DIAGNOSIS — J45909 Unspecified asthma, uncomplicated: Secondary | ICD-10-CM | POA: Diagnosis not present

## 2018-05-07 DIAGNOSIS — M170 Bilateral primary osteoarthritis of knee: Secondary | ICD-10-CM | POA: Diagnosis not present

## 2018-05-07 DIAGNOSIS — G35 Multiple sclerosis: Secondary | ICD-10-CM | POA: Diagnosis not present

## 2018-05-07 DIAGNOSIS — I10 Essential (primary) hypertension: Secondary | ICD-10-CM | POA: Diagnosis not present

## 2018-05-08 NOTE — Progress Notes (Signed)
Subjective:    Patient ID: Morgan Espinoza, female    DOB: 1951/06/20, 67 y.o.   MRN: [id number]  HPI The patient is here for follow up from the hospital.  She is here with her brother.  Her daughter was called on his cell phone.    She has a history of multiple sclerosis, myeloma, hypertension, hyperlipidemia and cognitive impairment who was brought to the emergency room by her daughter for concerns regarding her memory.  She had seen some decline in her memory, but nothing out of the ordinary.  There are no specific complaints that day.  The patient had some diarrhea over the past couple of months.  There were no auditory/visual hallucinations or thoughts of self-harm.  She currently lives alone, but her children checkin on her weekly. They have said she does have auditory and visual hallucinations.  She has not been paying her bills or cleaning up after herself.  There is no aggressive behavior.  The family is concerned she is not able to do her own ADL's.   She was diagnosed with dementia with behavioral disturbance.  She was discharged from the ED and it here for follow up.  She saw neurology on 9/26 and was started on namenda and aricept.  The patient has no complaints.  She does have memory issues and is a poor historian.  Her daughter mentioned that she does complain of some abdominal pain at times-in the morning.  When asked she does state some abdominal pain, but is not able to give any more information.  She currently does not have any pain.  She also states she has occasional headaches.  Per her daughter she forgets to take her medications.  She has cook something on the stove and forgot to turn the stove off and has begun her food.  There have been no fires.  She has very bad short-term memory-if her daughter tells her something she will forget it.  She forgets to pay her bills.  She is currently not driving-her car is broken.  She is able to dress herself, feed herself and shower/bathe  herself.  Her daughter states she is continent of this bladder and bowel, but for long car rides she will wear a diaper.  She does smell like urine here today.  Her daughter denies any disorientation, wandering or verbal abusiveness.   Medications and allergies reviewed with patient and updated if appropriate.  Patient Active Problem List   Diagnosis Date Noted  . Memory loss 05/06/2018  . Abnormal finding on MRI of brain 05/06/2018  . Dementia with behavioral disturbance   . Pericardial effusion 03/01/2018  . Valvular heart disease 12/29/2017  . Abdominal pain 11/03/2017  . MCI (mild cognitive impairment) 10/23/2016  . Lytic bone lesions on xray 06/03/2016  . Primary osteoarthritis of both knees 10/12/2014  . B12 deficiency 10/12/2014  . 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 Hyperlipidemia   . Vertigo   . Vitamin D deficiency   . Obesity   . Anemia, iron deficiency 07/23/2012  . 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 Rib lesion 04/28/2012  . Hypertension 06/05/2011  . Asthma 06/05/2011  . GERD (gastroesophageal reflux disease) 06/05/2011  . MS (multiple sclerosis) ([place]) 06/05/2011    Current Outpatient Medications on File Prior to Visit  Medication Sig Dispense Refill  . acetaminophen (TYLENOL) 500 MG tablet Take 1,000 mg by mouth every 6 (six) hours as needed for moderate pain.    Marland Kitchen albuterol (PROVENTIL HFA;VENTOLIN HFA)  108 (90 Base) MCG/ACT inhaler Inhale 2 puffs into the lungs every 6 (six) hours as needed (cough, shortness of breath or wheezing.). 1 Inhaler 5  . amLODipine (NORVASC) 10 MG tablet TAKE 1 TABLET BY MOUTH ONCE DAILY 90 tablet 1  . aspirin 81 MG tablet Take 81 mg by mouth daily.    Marland Kitchen atorvastatin (LIPITOR) 10 MG tablet Take 1 tablet (10 mg total) by mouth daily. 90 tablet 1  . cholecalciferol (VITAMIN D) 1000 units tablet Take 1,000 Units by mouth daily.    . COMBIGAN 0.2-0.5 % ophthalmic solution Place 1 drop into both eyes daily.   3  . donepezil (ARICEPT) 10  MG tablet Take 1 tablet (10 mg total) by mouth at bedtime. 30 tablet 11  . losartan (COZAAR) 50 MG tablet Take 50 mg by mouth daily.    Marland Kitchen losartan (COZAAR) 50 MG tablet TAKE 1 TABLET BY MOUTH ONCE DAILY 90 tablet 1  . memantine (NAMENDA) 10 MG tablet Take 1 tablet (10 mg total) by mouth 2 (two) times daily. 60 tablet 11  . ranitidine (ZANTAC) 150 MG tablet TAKE 1 TABLET BY MOUTH TWICE DAILY 180 tablet 1  . telmisartan (MICARDIS) 80 MG tablet TAKE 1 TABLET BY MOUTH ONCE DAILY 90 tablet 1   No current facility-administered medications on file prior to visit.     Past Medical History:  Diagnosis Date  . Achilles tendonitis   . Anemia, iron deficiency 07/23/2012  . Asthma   . Cancer ([place]) 05/2012   plasma cell myeloma  . GERD (gastroesophageal reflux disease)   . Hyperlipidemia   . Hypertension   . Multiple sclerosis ([place])   . OA (osteoarthritis) of knee   . Obesity   . 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 S/P radiation therapy 08/12/12 - 09/21/12   left Anterior 2nd Rib Lesion / 50.4 GY / 28 Fractions  . Vertigo   . Vitamin D deficiency     Past Surgical History:  Procedure Laterality Date  . ABDOMINAL HYSTERECTOMY     fibroids  . BONE BIOPSY  05/17/12   Left 2nd Rib, Plasma Cell Myeloma  . BREAST EXCISIONAL BIOPSY Left    benign  . BREAST SURGERY     left breast biopsy-benign  . CHOLECYSTECTOMY     lap choley  . ROTATOR CUFF REPAIR      Social History   Socioeconomic History  . Marital status: Widowed    Spouse name: Not on file  . Number of children: 1  . Years of education: 35  . Highest education level: Not on file  Occupational History    Employer: RETIRED  . Occupation: Retired   Scientific laboratory technician  . Financial resource strain: Not hard at all  . Food insecurity:    Worry: Never true    Inability: Never true  . Transportation needs:    Medical: No    Non-medical: No  Tobacco Use  . Smoking status: Former Smoker    Packs/day: 0.10    Years:  2.00    Pack years: 0.20    Types: Cigarettes    Start date: 10/05/1976    Last attempt to quit: 04/19/1979    Years since quitting: 39.0  . Smokeless tobacco: Never Used  . Tobacco comment: quit 35 years ago   Substance and Sexual Activity  . Alcohol use: No    Alcohol/week: 0.0 standard drinks  . Drug use: No  . Sexual activity: Never  Lifestyle  . Physical activity:    Days per week: 0 days    Minutes per session: 0 min  . Stress: Not at all  Relationships  . Social connections:    Talks on phone: More than three times a week    Gets together: More than three times a week    Attends religious service: More than 4 times per year    Active member of club or organization: Yes    Attends meetings of clubs or organizations: More than 4 times per year    Relationship status: Widowed  Other Topics Concern  . 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    Patient is retired.    Patient is widowed.    Patient is right handed.    Patient has a high school education.    Patient has 1 child.     Family History  Problem Relation Age of Onset  . Dementia Mother   . Hypertension Mother   . Lung cancer Father        +tobacco  . Diabetes Sister   . Colon cancer Brother        youngest brother  . Diabetes Sister        sister # 73  . Diabetes Brother   . Stomach cancer Neg Hx   . Pancreatic cancer Neg Hx     Review of Systems  Constitutional: Negative for chills and fever.  Respiratory: Negative for cough, shortness of breath and wheezing.   Cardiovascular: Negative for chest pain, palpitations and leg swelling.  Gastrointestinal: Positive for abdominal pain (Occasional, no pain currently). Negative for constipation, diarrhea and nausea.  Neurological: Positive for headaches (Occasional, relieved with Tylenol).       Objective:   Vitals:   05/10/18 0959  BP: (!) 162/82  Pulse: 73  Resp: 18  Temp: 98.2 F  (36.8 C)  SpO2: 96%   BP Readings from Last 3 Encounters:  05/10/18 (!) 162/82  05/06/18 (!) 157/93  04/11/18 (!) 159/71   Wt Readings from Last 3 Encounters:  05/10/18 185 lb 12.8 oz (84.3 kg)  05/06/18 188 lb (85.3 kg)  04/10/18 193 lb (87.5 kg)   Body mass index is 33.98 kg/m.   Physical Exam    Constitutional: Appears well-developed and well-nourished. No distress.  HENT:  Head: Normocephalic and atraumatic.  Neck: Neck supple. No tracheal deviation present. No thyromegaly present.  No cervical lymphadenopathy Cardiovascular: Normal rate, regular rhythm and normal heart sounds.   2/6 systolic murmur heard. No carotid bruit .  No edema Pulmonary/Chest: Effort normal and breath sounds normal. No respiratory distress. No has no wheezes. No rales.  Skin: Skin is warm and dry. Not diaphoretic.  Psychiatric: Normal mood and affect. Behavior is normal.      Assessment & Plan:    See Problem List for Assessment and Plan of chronic medical problems.

## 2018-05-10 ENCOUNTER — Encounter: Payer: Self-pay | Admitting: Internal Medicine

## 2018-05-10 ENCOUNTER — Ambulatory Visit (INDEPENDENT_AMBULATORY_CARE_PROVIDER_SITE_OTHER): Payer: Medicare Other | Admitting: Internal Medicine

## 2018-05-10 VITALS — BP 162/82 | HR 73 | Temp 98.2°F | Resp 18 | Ht 62.0 in | Wt 185.8 lb

## 2018-05-10 DIAGNOSIS — N183 Chronic kidney disease, stage 3 unspecified: Secondary | ICD-10-CM | POA: Insufficient documentation

## 2018-05-10 DIAGNOSIS — Z23 Encounter for immunization: Secondary | ICD-10-CM

## 2018-05-10 DIAGNOSIS — F0391 Unspecified dementia with behavioral disturbance: Secondary | ICD-10-CM

## 2018-05-10 NOTE — Patient Instructions (Addendum)
We will complete the FL 2 and fax it to the social worker.      Flu immunization administered today.

## 2018-05-10 NOTE — Assessment & Plan Note (Signed)
Following with neurology for multiple sclerosis and dementia Started on Namenda and Aricept recently Not able to care for herself-unable to cook for herself, needs assistance with medications because she forgets to take them and not paying her bills FL2 form to be completed today and sent to social worker

## 2018-05-13 ENCOUNTER — Telehealth: Payer: Self-pay | Admitting: Internal Medicine

## 2018-05-13 DIAGNOSIS — I10 Essential (primary) hypertension: Secondary | ICD-10-CM | POA: Diagnosis not present

## 2018-05-13 DIAGNOSIS — M170 Bilateral primary osteoarthritis of knee: Secondary | ICD-10-CM | POA: Diagnosis not present

## 2018-05-13 DIAGNOSIS — J45909 Unspecified asthma, uncomplicated: Secondary | ICD-10-CM | POA: Diagnosis not present

## 2018-05-13 DIAGNOSIS — G35 Multiple sclerosis: Secondary | ICD-10-CM | POA: Diagnosis not present

## 2018-05-13 NOTE — Telephone Encounter (Signed)
Copied from [place]. Topic: General - Other >> May 13, 2018  9:38 AM Carolyn Stare wrote:  Agapito Games with [facility] would like discharging the pt  because since she will staying at her home.   Social worker with [facility] has been out to see the pt . Does not require a call back unless there are questions    [phone]

## 2018-05-13 NOTE — Telephone Encounter (Signed)
FYI

## 2018-05-13 NOTE — Telephone Encounter (Signed)
noted 

## 2018-05-17 NOTE — Progress Notes (Deleted)
Subjective:    Patient ID: Morgan Espinoza, female    DOB: 08/08/1951, 67 y.o.   MRN: [id number]  HPI The patient is here for an acute visit.   Stomach   Medications and allergies reviewed with patient and updated if appropriate.  Patient Active Problem List   Diagnosis Date Noted  . CKD (chronic kidney disease) stage 3, GFR 30-59 ml/min ([facility]) 05/10/2018  . Memory loss 05/06/2018  . Abnormal finding on MRI of brain 05/06/2018  . Dementia with behavioral disturbance ([place])   . Pericardial effusion 03/01/2018  . Valvular heart disease 12/29/2017  . Abdominal pain 11/03/2017  . Lytic bone lesions on xray 06/03/2016  . Primary osteoarthritis of both knees 10/12/2014  . B12 deficiency 10/12/2014  . 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 Hyperlipidemia   . Vertigo   . Vitamin D deficiency   . Obesity   . Anemia, iron deficiency 07/23/2012  . 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 Hypertension 06/05/2011  . Asthma 06/05/2011  . GERD (gastroesophageal reflux disease) 06/05/2011  . MS (multiple sclerosis) ([place]) 06/05/2011    Current Outpatient Medications on File Prior to Visit  Medication Sig Dispense Refill  . acetaminophen (TYLENOL) 500 MG tablet Take 1,000 mg by mouth every 6 (six) hours as needed for moderate pain.    Marland Kitchen albuterol (PROVENTIL HFA;VENTOLIN HFA) 108 (90 Base) MCG/ACT inhaler Inhale 2 puffs into the lungs every 6 (six) hours as needed (cough, shortness of breath or wheezing.). 1 Inhaler 5  . amLODipine (NORVASC) 10 MG tablet TAKE 1 TABLET BY MOUTH ONCE DAILY 90 tablet 1  . aspirin 81 MG tablet Take 81 mg by mouth daily.    Marland Kitchen atorvastatin (LIPITOR) 10 MG tablet Take 1 tablet (10 mg total) by mouth daily. 90 tablet 1  . cholecalciferol (VITAMIN D) 1000 units tablet Take 1,000 Units by mouth daily.    . COMBIGAN 0.2-0.5 % ophthalmic solution Place 1 drop into both eyes daily.   3  . donepezil (ARICEPT) 10 MG tablet Take 1 tablet (10 mg total) by mouth at bedtime. 30 tablet  11  . memantine (NAMENDA) 10 MG tablet Take 1 tablet (10 mg total) by mouth 2 (two) times daily. 60 tablet 11  . ranitidine (ZANTAC) 150 MG tablet TAKE 1 TABLET BY MOUTH TWICE DAILY 180 tablet 1  . telmisartan (MICARDIS) 80 MG tablet TAKE 1 TABLET BY MOUTH ONCE DAILY 90 tablet 1   No current facility-administered medications on file prior to visit.     Past Medical History:  Diagnosis Date  . Achilles tendonitis   . Anemia, iron deficiency 07/23/2012  . Asthma   . Cancer ([place]) 05/2012   plasma cell myeloma  . GERD (gastroesophageal reflux disease)   . Hyperlipidemia   . Hypertension   . Multiple sclerosis ([place])   . OA (osteoarthritis) of knee   . Obesity   . 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 S/P radiation therapy 08/12/12 - 09/21/12   left Anterior 2nd Rib Lesion / 50.4 GY / 28 Fractions  . Vertigo   . Vitamin D deficiency     Past Surgical History:  Procedure Laterality Date  . ABDOMINAL HYSTERECTOMY     fibroids  . BONE BIOPSY  05/17/12   Left 2nd Rib, Plasma Cell Myeloma  . BREAST EXCISIONAL BIOPSY Left    benign  . BREAST SURGERY     left breast biopsy-benign  . CHOLECYSTECTOMY  lap choley  . ROTATOR CUFF REPAIR      Social History   Socioeconomic History  . Marital status: Widowed    Spouse name: Not on file  . Number of children: 1  . Years of education: 73  . Highest education level: Not on file  Occupational History    Employer: RETIRED  . Occupation: Retired   Scientific laboratory technician  . Financial resource strain: Not hard at all  . Food insecurity:    Worry: Never true    Inability: Never true  . Transportation needs:    Medical: No    Non-medical: No  Tobacco Use  . Smoking status: Former Smoker    Packs/day: 0.10    Years: 2.00    Pack years: 0.20    Types: Cigarettes    Start date: 10/05/1976    Last attempt to quit: 04/19/1979    Years since quitting: 39.1  . Smokeless tobacco: Never Used  . Tobacco comment: quit 35 years ago     Substance and Sexual Activity  . Alcohol use: No    Alcohol/week: 0.0 standard drinks  . Drug use: No  . Sexual activity: Never  Lifestyle  . Physical activity:    Days per week: 0 days    Minutes per session: 0 min  . Stress: Not at all  Relationships  . Social connections:    Talks on phone: More than three times a week    Gets together: More than three times a week    Attends religious service: More than 4 times per year    Active member of club or organization: Yes    Attends meetings of clubs or organizations: More than 4 times per year    Relationship status: Widowed  Other Topics Concern  . 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    Patient is retired.    Patient is widowed.    Patient is right handed.    Patient has a high school education.    Patient has 1 child.     Family History  Problem Relation Age of Onset  . Dementia Mother   . Hypertension Mother   . Lung cancer Father        +tobacco  . Diabetes Sister   . Colon cancer Brother        youngest brother  . Diabetes Sister        sister # 33  . Diabetes Brother   . Stomach cancer Neg Hx   . Pancreatic cancer Neg Hx     Review of Systems     Objective:  There were no vitals filed for this visit. BP Readings from Last 3 Encounters:  05/10/18 (!) 162/82  05/06/18 (!) 157/93  04/11/18 (!) 159/71   Wt Readings from Last 3 Encounters:  05/10/18 185 lb 12.8 oz (84.3 kg)  05/06/18 188 lb (85.3 kg)  04/10/18 193 lb (87.5 kg)   There is no height or weight on file to calculate BMI.   Physical Exam         Assessment & Plan:    See Problem List for Assessment and Plan of chronic medical problems.

## 2018-05-18 ENCOUNTER — Ambulatory Visit: Payer: Self-pay | Admitting: Internal Medicine

## 2018-05-18 NOTE — Progress Notes (Deleted)
Subjective:    Patient ID: Morgan Espinoza, female    DOB: Apr 30, 1951, 67 y.o.   MRN: [id number]  HPI The patient is here for an acute visit.   Stomach upset:  She was started on Namenda and Aricept 05/06/2018.  Medications and allergies reviewed with patient and updated if appropriate.  Patient Active Problem List   Diagnosis Date Noted  . CKD (chronic kidney disease) stage 3, GFR 30-59 ml/min (HCC) 05/10/2018  . Memory loss 05/06/2018  . Abnormal finding on MRI of brain 05/06/2018  . Dementia with behavioral disturbance ([place])   . Pericardial effusion 03/01/2018  . Valvular heart disease 12/29/2017  . Abdominal pain 11/03/2017  . Lytic bone lesions on xray 06/03/2016  . Primary osteoarthritis of both knees 10/12/2014  . B12 deficiency 10/12/2014  . 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 Hyperlipidemia   . Vertigo   . Vitamin D deficiency   . Obesity   . Anemia, iron deficiency 07/23/2012  . 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 Hypertension 06/05/2011  . Asthma 06/05/2011  . GERD (gastroesophageal reflux disease) 06/05/2011  . MS (multiple sclerosis) ([place]) 06/05/2011    Current Outpatient Medications on File Prior to Visit  Medication Sig Dispense Refill  . acetaminophen (TYLENOL) 500 MG tablet Take 1,000 mg by mouth every 6 (six) hours as needed for moderate pain.    Marland Kitchen albuterol (PROVENTIL HFA;VENTOLIN HFA) 108 (90 Base) MCG/ACT inhaler Inhale 2 puffs into the lungs every 6 (six) hours as needed (cough, shortness of breath or wheezing.). 1 Inhaler 5  . amLODipine (NORVASC) 10 MG tablet TAKE 1 TABLET BY MOUTH ONCE DAILY 90 tablet 1  . aspirin 81 MG tablet Take 81 mg by mouth daily.    Marland Kitchen atorvastatin (LIPITOR) 10 MG tablet Take 1 tablet (10 mg total) by mouth daily. 90 tablet 1  . cholecalciferol (VITAMIN D) 1000 units tablet Take 1,000 Units by mouth daily.    . COMBIGAN 0.2-0.5 % ophthalmic solution Place 1 drop into both eyes daily.   3  . donepezil (ARICEPT) 10 MG tablet  Take 1 tablet (10 mg total) by mouth at bedtime. 30 tablet 11  . memantine (NAMENDA) 10 MG tablet Take 1 tablet (10 mg total) by mouth 2 (two) times daily. 60 tablet 11  . ranitidine (ZANTAC) 150 MG tablet TAKE 1 TABLET BY MOUTH TWICE DAILY 180 tablet 1  . telmisartan (MICARDIS) 80 MG tablet TAKE 1 TABLET BY MOUTH ONCE DAILY 90 tablet 1   No current facility-administered medications on file prior to visit.     Past Medical History:  Diagnosis Date  . Achilles tendonitis   . Anemia, iron deficiency 07/23/2012  . Asthma   . Cancer ([place]) 05/2012   plasma cell myeloma  . GERD (gastroesophageal reflux disease)   . Hyperlipidemia   . Hypertension   . Multiple sclerosis ([place])   . OA (osteoarthritis) of knee   . Obesity   . 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 S/P radiation therapy 08/12/12 - 09/21/12   left Anterior 2nd Rib Lesion / 50.4 GY / 28 Fractions  . Vertigo   . Vitamin D deficiency     Past Surgical History:  Procedure Laterality Date  . ABDOMINAL HYSTERECTOMY     fibroids  . BONE BIOPSY  05/17/12   Left 2nd Rib, Plasma Cell Myeloma  . BREAST EXCISIONAL BIOPSY Left    benign  . BREAST SURGERY     left  breast biopsy-benign  . CHOLECYSTECTOMY     lap choley  . ROTATOR CUFF REPAIR      Social History   Socioeconomic History  . Marital status: Widowed    Spouse name: Not on file  . Number of children: 1  . Years of education: 50  . Highest education level: Not on file  Occupational History    Employer: RETIRED  . Occupation: Retired   Scientific laboratory technician  . Financial resource strain: Not hard at all  . Food insecurity:    Worry: Never true    Inability: Never true  . Transportation needs:    Medical: No    Non-medical: No  Tobacco Use  . Smoking status: Former Smoker    Packs/day: 0.10    Years: 2.00    Pack years: 0.20    Types: Cigarettes    Start date: 10/05/1976    Last attempt to quit: 04/19/1979    Years since quitting: 39.1  . Smokeless  tobacco: Never Used  . Tobacco comment: quit 35 years ago   Substance and Sexual Activity  . Alcohol use: No    Alcohol/week: 0.0 standard drinks  . Drug use: No  . Sexual activity: Never  Lifestyle  . Physical activity:    Days per week: 0 days    Minutes per session: 0 min  . Stress: Not at all  Relationships  . Social connections:    Talks on phone: More than three times a week    Gets together: More than three times a week    Attends religious service: More than 4 times per year    Active member of club or organization: Yes    Attends meetings of clubs or organizations: More than 4 times per year    Relationship status: Widowed  Other Topics Concern  . 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    Patient is retired.    Patient is widowed.    Patient is right handed.    Patient has a high school education.    Patient has 1 child.     Family History  Problem Relation Age of Onset  . Dementia Mother   . Hypertension Mother   . Lung cancer Father        +tobacco  . Diabetes Sister   . Colon cancer Brother        youngest brother  . Diabetes Sister        sister # 55  . Diabetes Brother   . Stomach cancer Neg Hx   . Pancreatic cancer Neg Hx     Review of Systems     Objective:  There were no vitals filed for this visit. BP Readings from Last 3 Encounters:  05/10/18 (!) 162/82  05/06/18 (!) 157/93  04/11/18 (!) 159/71   Wt Readings from Last 3 Encounters:  05/10/18 185 lb 12.8 oz (84.3 kg)  05/06/18 188 lb (85.3 kg)  04/10/18 193 lb (87.5 kg)   There is no height or weight on file to calculate BMI.   Physical Exam         Assessment & Plan:    See Problem List for Assessment and Plan of chronic medical problems.

## 2018-05-19 ENCOUNTER — Emergency Department (HOSPITAL_COMMUNITY)
Admission: EM | Admit: 2018-05-19 | Discharge: 2018-05-19 | Disposition: A | Discharge status: Home / Self Care | Payer: Medicare Other | Attending: Emergency Medicine | Admitting: Emergency Medicine

## 2018-05-19 ENCOUNTER — Telehealth: Payer: Self-pay

## 2018-05-19 ENCOUNTER — Encounter (HOSPITAL_COMMUNITY): Payer: Self-pay | Admitting: *Deleted

## 2018-05-19 ENCOUNTER — Ambulatory Visit: Payer: Self-pay | Admitting: Internal Medicine

## 2018-05-19 ENCOUNTER — Emergency Department (HOSPITAL_COMMUNITY): Payer: Medicare Other

## 2018-05-19 ENCOUNTER — Other Ambulatory Visit: Payer: Self-pay

## 2018-05-19 DIAGNOSIS — G35 Multiple sclerosis: Secondary | ICD-10-CM | POA: Insufficient documentation

## 2018-05-19 DIAGNOSIS — R1013 Epigastric pain: Secondary | ICD-10-CM | POA: Insufficient documentation

## 2018-05-19 DIAGNOSIS — R51 Headache: Secondary | ICD-10-CM | POA: Insufficient documentation

## 2018-05-19 DIAGNOSIS — R1010 Upper abdominal pain, unspecified: Secondary | ICD-10-CM | POA: Diagnosis present

## 2018-05-19 DIAGNOSIS — Z79899 Other long term (current) drug therapy: Secondary | ICD-10-CM | POA: Diagnosis not present

## 2018-05-19 DIAGNOSIS — I129 Hypertensive chronic kidney disease with stage 1 through stage 4 chronic kidney disease, or unspecified chronic kidney disease: Secondary | ICD-10-CM | POA: Diagnosis not present

## 2018-05-19 DIAGNOSIS — R410 Disorientation, unspecified: Secondary | ICD-10-CM | POA: Diagnosis not present

## 2018-05-19 DIAGNOSIS — E785 Hyperlipidemia, unspecified: Secondary | ICD-10-CM | POA: Diagnosis not present

## 2018-05-19 DIAGNOSIS — Z7982 Long term (current) use of aspirin: Secondary | ICD-10-CM | POA: Diagnosis not present

## 2018-05-19 DIAGNOSIS — N183 Chronic kidney disease, stage 3 (moderate): Secondary | ICD-10-CM | POA: Diagnosis not present

## 2018-05-19 DIAGNOSIS — J45909 Unspecified asthma, uncomplicated: Secondary | ICD-10-CM | POA: Insufficient documentation

## 2018-05-19 DIAGNOSIS — Z87891 Personal history of nicotine dependence: Secondary | ICD-10-CM | POA: Insufficient documentation

## 2018-05-19 DIAGNOSIS — R001 Bradycardia, unspecified: Secondary | ICD-10-CM | POA: Diagnosis not present

## 2018-05-19 DIAGNOSIS — R519 Headache, unspecified: Secondary | ICD-10-CM

## 2018-05-19 LAB — CBC
HEMATOCRIT: 36.1 % (ref 36.0–46.0)
Hemoglobin: 11.3 g/dL — ABNORMAL LOW (ref 12.0–15.0)
MCH: 29.3 pg (ref 26.0–34.0)
MCHC: 31.3 g/dL (ref 30.0–36.0)
MCV: 93.5 fL (ref 80.0–100.0)
PLATELETS: 203 10*3/uL (ref 150–400)
RBC: 3.86 MIL/uL — ABNORMAL LOW (ref 3.87–5.11)
RDW: 12 % (ref 11.5–15.5)
WBC: 4.5 10*3/uL (ref 4.0–10.5)
nRBC: 0 % (ref 0.0–0.2)

## 2018-05-19 LAB — URINALYSIS, ROUTINE W REFLEX MICROSCOPIC
Bilirubin Urine: NEGATIVE
Glucose, UA: NEGATIVE mg/dL
HGB URINE DIPSTICK: NEGATIVE
KETONES UR: 5 mg/dL — AB
Leukocytes, UA: NEGATIVE
Nitrite: NEGATIVE
PH: 6 (ref 5.0–8.0)
Protein, ur: NEGATIVE mg/dL
Specific Gravity, Urine: 1.019 (ref 1.005–1.030)

## 2018-05-19 LAB — COMPREHENSIVE METABOLIC PANEL
ALT: 18 U/L (ref 0–44)
AST: 24 U/L (ref 15–41)
Albumin: 3.5 g/dL (ref 3.5–5.0)
Alkaline Phosphatase: 58 U/L (ref 38–126)
Anion gap: 8 (ref 5–15)
BUN: 9 mg/dL (ref 8–23)
CALCIUM: 9.4 mg/dL (ref 8.9–10.3)
CHLORIDE: 105 mmol/L (ref 98–111)
CO2: 29 mmol/L (ref 22–32)
CREATININE: 1.13 mg/dL — AB (ref 0.44–1.00)
GFR calc Af Amer: 57 mL/min — ABNORMAL LOW (ref >60)
GFR, EST NON AFRICAN AMERICAN: 49 mL/min — AB (ref >60)
Glucose, Bld: 97 mg/dL (ref 70–99)
Potassium: 3 mmol/L — ABNORMAL LOW (ref 3.5–5.1)
SODIUM: 142 mmol/L (ref 135–145)
Total Bilirubin: 0.6 mg/dL (ref 0.3–1.2)
Total Protein: 6.8 g/dL (ref 6.5–8.1)

## 2018-05-19 LAB — LIPASE, BLOOD: LIPASE: 30 U/L (ref 11–51)

## 2018-05-19 MED ORDER — FENTANYL CITRATE (PF) 100 MCG/2ML IJ SOLN
25.0000 ug | Freq: Once | INTRAMUSCULAR | Status: AC
  Administered 2018-05-19: 25 ug via INTRAVENOUS
  Filled 2018-05-19: qty 2

## 2018-05-19 MED ORDER — POTASSIUM CHLORIDE CRYS ER 20 MEQ PO TBCR
40.0000 meq | EXTENDED_RELEASE_TABLET | Freq: Once | ORAL | Status: AC
  Administered 2018-05-19: 40 meq via ORAL
  Filled 2018-05-19: qty 2

## 2018-05-19 NOTE — Discharge Instructions (Addendum)
Schedule follow-up appointment with your primary care provider.  Because your blood pressure medications and your intermittent abdominal pain. Take Tylenol as needed for headache. Return to the ER for new or concerning symptoms.

## 2018-05-19 NOTE — Progress Notes (Addendum)
CSW aware of consult. CSW will consult with RNCM about home health aid and nurse.   Update: CSW and RNCM met with pt and pt's family at bedside. Pt is receiving home health via [facility]. Per PCP notes and pt's family, pt is working with [facility] to find placement. [facility]PS given FL-2. Family members check in on pt regularly. Family members give pt medications in morning and evenings and will be with pt tomorrow.   CSW signing off.   Wendelyn Breslow, [facility]  [phone]

## 2018-05-19 NOTE — ED Notes (Signed)
Patient transported to CT 

## 2018-05-19 NOTE — ED Triage Notes (Signed)
Pt in c/o abdominal pain that started this morning, reports nausea but denies vomiting, also diarrhea, no distress noted

## 2018-05-19 NOTE — ED Notes (Signed)
Patient verbalizes understanding of discharge instructions. Opportunity for questioning and answers were provided. Armband removed by staff, pt discharged from ED.  

## 2018-05-19 NOTE — ED Notes (Signed)
Patient family left the room, patient was wandering in the hall and had removed her IV, and unplugged herself from the monitor and dressed herself. Redirected patient to room.

## 2018-05-19 NOTE — ED Notes (Signed)
Spoke to CT about eta for her CT head, stated they would come get her after one more person.

## 2018-05-19 NOTE — ED Provider Notes (Signed)
[place] EMERGENCY DEPARTMENT Provider Note   CSN: [id number] Arrival date & time: 05/19/18  1003     History   Chief Complaint Chief Complaint  Patient presents with  . Abdominal Pain    HPI Morgan Espinoza is a 67 y.o. female w PMHx HTN, MS, dementia, GERD, asthma, CKD, presenting to the ED with family. Pt brought in with concern of taking an extra dose of her daily medications. Her caregiver states when he arrived this morning, he saw she had taken her morning medications for today as well as her medications laid out for Sunday. He states no other medications were missing. She has been intermittently complaining of upper abdominal pain, though has a hx of this. He wonders if this could be causing her abdominal pain. Pt lives at home, though has been gradually having worsening problems with her memory. She is reportedly at her baseline today. Per chart review, last PCP visit on  05/10/18 with evidence of ongoing decline in her memory. Social work was consulted and she has a Marine scientist visiting her. She was also started on namenda and aricept per neurologist on 05/06/18.  Pt denies N/V, fever. States her abdomen is not hurting her in the ED. It is worse with eating on occasion.  The history is provided by the patient, a relative, a caregiver and medical records.    Past Medical History:  Diagnosis Date  . Achilles tendonitis   . Anemia, iron deficiency 07/23/2012  . Asthma   . Cancer ([place]) 05/2012   plasma cell myeloma  . GERD (gastroesophageal reflux disease)   . Hyperlipidemia   . Hypertension   . Multiple sclerosis ([place])   . OA (osteoarthritis) of knee   . Obesity   . 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 S/P radiation therapy 08/12/12 - 09/21/12   left Anterior 2nd Rib Lesion / 50.4 GY / 28 Fractions  . Vertigo   . Vitamin D deficiency     Patient Active Problem List   Diagnosis Date Noted  . CKD (chronic kidney disease) stage 3, GFR 30-59 ml/min  (HCC) 05/10/2018  . Memory loss 05/06/2018  . Abnormal finding on MRI of brain 05/06/2018  . Dementia with behavioral disturbance ([place])   . Pericardial effusion 03/01/2018  . Valvular heart disease 12/29/2017  . Abdominal pain 11/03/2017  . Lytic bone lesions on xray 06/03/2016  . Primary osteoarthritis of both knees 10/12/2014  . B12 deficiency 10/12/2014  . 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 Hyperlipidemia   . Vertigo   . Vitamin D deficiency   . Obesity   . Anemia, iron deficiency 07/23/2012  . 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 Hypertension 06/05/2011  . Asthma 06/05/2011  . GERD (gastroesophageal reflux disease) 06/05/2011  . MS (multiple sclerosis) ([place]) 06/05/2011    Past Surgical History:  Procedure Laterality Date  . ABDOMINAL HYSTERECTOMY     fibroids  . BONE BIOPSY  05/17/12   Left 2nd Rib, Plasma Cell Myeloma  . BREAST EXCISIONAL BIOPSY Left    benign  . BREAST SURGERY     left breast biopsy-benign  . CHOLECYSTECTOMY     lap choley  . ROTATOR CUFF REPAIR       OB History   None      Home Medications    Prior to Admission medications   Medication Sig Start Date End Date Taking? Authorizing Provider  acetaminophen (TYLENOL) 500 MG tablet Take 1,000 mg by  mouth every 6 (six) hours as needed for moderate pain.    [provider]  albuterol (PROVENTIL HFA;VENTOLIN HFA) 108 (90 Base) MCG/ACT inhaler Inhale 2 puffs into the lungs every 6 (six) hours as needed (cough, shortness of breath or wheezing.). 12/05/15   Darlyne Russian, MD  amLODipine (NORVASC) 10 MG tablet TAKE 1 TABLET BY MOUTH ONCE DAILY 04/27/18   Binnie Rail, MD  aspirin 81 MG tablet Take 81 mg by mouth daily.    [provider]  atorvastatin (LIPITOR) 10 MG tablet Take 1 tablet (10 mg total) by mouth daily. 02/19/18   Binnie Rail, MD  cholecalciferol (VITAMIN D) 1000 units tablet Take 1,000 Units by mouth daily.    [provider]  COMBIGAN 0.2-0.5 % ophthalmic  solution Place 1 drop into both eyes daily.  01/19/18   [provider]  donepezil (ARICEPT) 10 MG tablet Take 1 tablet (10 mg total) by mouth at bedtime. 05/06/18   Marcial Pacas, MD  memantine (NAMENDA) 10 MG tablet Take 1 tablet (10 mg total) by mouth 2 (two) times daily. 05/06/18   Marcial Pacas, MD  ranitidine (ZANTAC) 150 MG tablet TAKE 1 TABLET BY MOUTH TWICE DAILY 04/27/18   Binnie Rail, MD  telmisartan (MICARDIS) 80 MG tablet TAKE 1 TABLET BY MOUTH ONCE DAILY 04/27/18   Binnie Rail, MD    Family History Family History  Problem Relation Age of Onset  . Dementia Mother   . Hypertension Mother   . Lung cancer Father        +tobacco  . Diabetes Sister   . Colon cancer Brother        youngest brother  . Diabetes Sister        sister # 56  . Diabetes Brother   . Stomach cancer Neg Hx   . Pancreatic cancer Neg Hx     Social History Social History   Tobacco Use  . Smoking status: Former Smoker    Packs/day: 0.10    Years: 2.00    Pack years: 0.20    Types: Cigarettes    Start date: 10/05/1976    Last attempt to quit: 04/19/1979    Years since quitting: 39.1  . Smokeless tobacco: Never Used  . Tobacco comment: quit 35 years ago   Substance Use Topics  . Alcohol use: No    Alcohol/week: 0.0 standard drinks  . Drug use: No     Allergies   Celebrex [celecoxib]; Dristan; Robitussin (alcohol free) [guaifenesin]; Ropinirole hcl; and Tramadol   Review of Systems Review of Systems  Constitutional: Negative for fever.  Gastrointestinal: Positive for abdominal pain. Negative for nausea and vomiting.  Psychiatric/Behavioral: Positive for confusion (chronic, not acutely worsening).  All other systems reviewed and are negative.    Physical Exam Updated Vital Signs BP (!) 205/97 (BP Location: Right Arm)   Pulse 62   Temp 97.8 F (36.6 C) (Oral)   Resp 16   Ht 5\' 2"  (1.575 m)   Wt 84.3 kg   SpO2 98%   BMI 33.99 kg/m   Physical Exam  Constitutional: She  appears well-developed and well-nourished. No distress.  HENT:  Head: Normocephalic and atraumatic.  Eyes: Pupils are equal, round, and reactive to light. Conjunctivae and EOM are normal.  Neck: Normal range of motion. Neck supple.  Cardiovascular: Regular rhythm and intact distal pulses.  Slightly bradycardic  Pulmonary/Chest: Effort normal and breath sounds normal. No respiratory distress.  Abdominal: Soft. Bowel  sounds are normal. She exhibits no distension and no mass. There is no tenderness. There is no rebound and no guarding.  Neurological: She is alert.  Pt is not oriented to time. CN grossly normal. Normal tone. Moving all extremities without ataxia. Normal gait.  Skin: Skin is warm.  Psychiatric: She has a normal mood and affect. Her behavior is normal.  Nursing note and vitals reviewed.    ED Treatments / Results  Labs (all labs ordered are listed, but only abnormal results are displayed) Labs Reviewed  COMPREHENSIVE METABOLIC PANEL - Abnormal; Notable for the following components:      Result Value   Potassium 3.0 (*)    Creatinine, Ser 1.13 (*)    GFR calc non Af Amer 49 (*)    GFR calc Af Amer 57 (*)    All other components within normal limits  CBC - Abnormal; Notable for the following components:   RBC 3.86 (*)    Hemoglobin 11.3 (*)    All other components within normal limits  URINALYSIS, ROUTINE W REFLEX MICROSCOPIC - Abnormal; Notable for the following components:   Ketones, ur 5 (*)    All other components within normal limits  LIPASE, BLOOD    EKG EKG Interpretation  Date/Time:  Wednesday May 19 2018 15:41:41 EDT Ventricular Rate:  44 PR Interval:    QRS Duration: 97 QT Interval:  514 QTC Calculation: 440 R Axis:   53 Text Interpretation:  Sinus bradycardia Borderline short PR interval Abnrm T, consider ischemia, anterolateral lds No acute changes No old tracing to compare Confirmed by Varney Biles [phone]) on 05/19/2018 4:49:26  PM   Radiology Ct Head Wo Contrast  Result Date: 05/19/2018 CLINICAL DATA:  Severe headaches EXAM: CT HEAD WITHOUT CONTRAST TECHNIQUE: Contiguous axial images were obtained from the base of the skull through the vertex without intravenous contrast. COMPARISON:  April 10, 2018 FINDINGS: Brain: No evidence of acute infarction, hemorrhage, hydrocephalus, extra-axial collection or mass lesion/mass effect. Bilateral periventricular white matter small vessel ischemic changes identified. Vascular: No hyperdense vessel. Skull: Normal. Negative for fracture or focal lesion. Sinuses/Orbits: No acute finding. Other: None. IMPRESSION: No focal acute intracranial abnormality identified. Bilateral periventricular white matter small vessel ischemic change. Electronically Signed   By: Abelardo Diesel M.D.   On: 05/19/2018 18:42    Procedures Procedures (including critical care time)  Medications Ordered in ED Medications  fentaNYL (SUBLIMAZE) injection 25 mcg (25 mcg Intravenous Given 05/19/18 1638)  potassium chloride SA (K-DUR,KLOR-CON) CR tablet 40 mEq (40 mEq Oral Given 05/19/18 1920)     Initial Impression / Assessment and Plan / ED Course  I have reviewed the triage vital signs and the nursing notes.  Pertinent labs & imaging results that were available during my care of the patient were reviewed by me and considered in my medical decision making (see chart for details).  Clinical Course as of May 20 2155  Wed May 19, 2018  1637 Acute onset of right sided headache. Not cooperating well with neuro exam, though pt in tears. Will order fentanyl for pain and CT given pt is hypertensive.   [JR]  1800 HA improved after fentanyl, though still complaining of pain in right forehead. Denies pain behind her eye, no temporal tenderness of palpable cords.    [JR]  2024 Pt evaluated by Dr. Kathrynn Humble. Pt is safe for discharge. SW and home health consults placed. Messaged her PCP to close the loop for follow up of  HTN and increasing  need for help at home.   [JR]    Clinical Course User Index [JR] Robinson, [place] N, PA-C    Patient brought in by family with concern of taking an extra dose of her home medications, including losartan, telmisartan, Aricept, Namenda, ranitidine.  Also complaining of chronic abdominal pain that is not worsening.  On exam, patient is well-appearing and not in distress.  Abdomen is soft and nontender.  She is alert though not oriented to time.  During ED stay, patient began complaining of acute onset of right-sided frontal headache.  Given hypertension, CT head was ordered and resulting as negative for acute intracranial pathology.  On reevaluation, headache improved after fentanyl.  Patient denies eye pain, eye is not red and PERRL.Marland Kitchen  No evidence of temporal arteritis, cranial nerves remain grossly normal.  Labs with slight hypokalemia though no EKG changes.  Creatinine appears to be at her baseline.  No leukocytosis, lipase within normal limits.  UA without infection.  Vital signs without evidence of adverse effects of medication overdose.  Patient appears to be at her mental baseline and is no acute distress on reevaluation.  Patient discussed with and evaluated by Dr. Kathrynn Humble.  Patient is safe for discharge at this time with recommendation to follow-up closely with her primary care.  Patient may benefit from more help at home with daily medications and ADLs, case management and social work saw patient in ED and reports she is in the process of being placed in a nursing facility. PCP messaged to close the loop.  Discussed results, findings, treatment and follow up. Patient advised of return precautions. Patient verbalized understanding and agreed with plan.  Final Clinical Impressions(s) / ED Diagnoses   Final diagnoses:  Bad headache  Intermittent epigastric abdominal pain    ED Discharge Orders    None       Robinson, [place] N, PA-C 05/19/18 2157    Varney Biles,  MD 05/21/18 [phone]

## 2018-05-19 NOTE — Telephone Encounter (Signed)
Copied from [place] [id number]. Topic: Quick Communication - Appointment Cancellation >> May 19, 2018  8:47 AM Morgan Espinoza wrote: Patient called to cancel appointment scheduled for 05/19/18.  Patient has not rescheduled their appointment. Pt is too sick to come in.  Family is taking her to the ER  Route to department's PEC pool. >> May 19, 2018  9:13 AM Morgan Espinoza wrote: Morgan Espinoza - Patient going to ed.

## 2018-05-19 NOTE — Telephone Encounter (Signed)
FYI

## 2018-05-19 NOTE — Care Management (Signed)
CM placed call to [facility] awaiting a return call.

## 2018-05-19 NOTE — Telephone Encounter (Signed)
noted 

## 2018-05-19 NOTE — Care Management (Addendum)
ED CM reviewed patient record, noted PCP office  is working with  ED CM met with patient and family at bedside. CM verified patient being active with [facility] services. Patient states, nurse was scheduled to come out today but patient was in the ED. CM informed patient that CM would inform [facility] of her discharge from the ED tonight. Alvis Lemmings will notify patient of post ED visit.  Patient verbalized understanding teach done. CM updated EDP of transitional care plan

## 2018-05-21 ENCOUNTER — Telehealth: Payer: Self-pay

## 2018-05-21 ENCOUNTER — Other Ambulatory Visit: Payer: Self-pay

## 2018-05-21 MED ORDER — TELMISARTAN 80 MG PO TABS: 80.0000 mg | ORAL_TABLET | Freq: Every day | ORAL | 1 refills | Status: DC

## 2018-05-21 NOTE — Telephone Encounter (Signed)
Advised that pt is taking telmisartan instead of losartan. Daughter understood. Refill sent to pharmacy.

## 2018-05-21 NOTE — Telephone Encounter (Signed)
Copied from [place] [id number]. Topic: General - Other >> May 21, 2018  8:44 AM Janace Aris A wrote: Reason for CRM: Patient's daughter called in wanting a refill on the medication losartan (COZAAR) 50 MG tablet for patient, sent to the [facility] on file. The medication looks as though it's been discontinued.   Can we still refill this for the patient?   Please advise

## 2018-05-21 NOTE — Telephone Encounter (Signed)
LVM for daughter to call back in regards to message below.

## 2018-05-25 NOTE — Progress Notes (Signed)
Subjective:    Patient ID: Morgan Espinoza, female    DOB: [birth year], 67 y.o.   MRN: [id number]  HPI The patient is here for follow up from the ED.  She is here with her brother.  She has dementia and is a poor historian, he is not able to supplement much.  ED 10/9 for abdominal pain and headache.  He rfamily was concerned she took an extra dose of her daily medication (losartan/telmisartan, aricpet, namenda, zantac).  She was complaining intermittently of upper abdominal pain.  She denies abdominal pain in the ED.  She denied N/ V and fever.  Her work up in the ED was negative.  Her abdomen was soft and nontender.  In the ED she had acute onset of right sided frontal headache. CT head was normal.     Headaches:  She continues to have daily headaches - frontal part of head.  She has some lightheadedness.  She states the headaches are not constant.  She is taking her BP medications - a friend comes over and gives her the medication twice a day.   Abdominal pain:  She has some mild upper abdominal pain.  She thinks it is getting better.  She states occasional GERD.  She denies constipation or diarrhea.    Hypertension;  She states she did take her medications today, but sometimes she does forget.  Since the friend has been coming over she has been taking them consistently.  She denies chest pain, sob, palps.   Medications and allergies reviewed with patient and updated if appropriate.  Patient Active Problem List   Diagnosis Date Noted  . CKD (chronic kidney disease) stage 3, GFR 30-59 ml/min (HCC) 05/10/2018  . Memory loss 05/06/2018  . Abnormal finding on MRI of brain 05/06/2018  . Dementia with behavioral disturbance ([place])   . Pericardial effusion 03/01/2018  . Valvular heart disease 12/29/2017  . Abdominal pain 11/03/2017  . Lytic bone lesions on xray 06/03/2016  . Primary osteoarthritis of both knees 10/12/2014  . B12 deficiency 10/12/2014  . 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  Hyperlipidemia   . Vertigo   . Vitamin D deficiency   . Obesity   . Anemia, iron deficiency 07/23/2012  . 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 Hypertension 06/05/2011  . Asthma 06/05/2011  . GERD (gastroesophageal reflux disease) 06/05/2011  . MS (multiple sclerosis) ([place]) 06/05/2011    Current Outpatient Medications on File Prior to Visit  Medication Sig Dispense Refill  . acetaminophen (TYLENOL) 500 MG tablet Take 1,000 mg by mouth every 6 (six) hours as needed for moderate pain.    Marland Kitchen albuterol (PROVENTIL HFA;VENTOLIN HFA) 108 (90 Base) MCG/ACT inhaler Inhale 2 puffs into the lungs every 6 (six) hours as needed (cough, shortness of breath or wheezing.). 1 Inhaler 5  . amLODipine (NORVASC) 10 MG tablet TAKE 1 TABLET BY MOUTH ONCE DAILY 90 tablet 1  . aspirin 81 MG tablet Take 81 mg by mouth daily.    Marland Kitchen atorvastatin (LIPITOR) 10 MG tablet Take 1 tablet (10 mg total) by mouth daily. 90 tablet 1  . cholecalciferol (VITAMIN D) 1000 units tablet Take 1,000 Units by mouth daily.    . COMBIGAN 0.2-0.5 % ophthalmic solution Place 1 drop into both eyes daily.   3  . donepezil (ARICEPT) 10 MG tablet Take 1 tablet (10 mg total) by mouth at bedtime. 30 tablet 11  . memantine (NAMENDA) 10 MG tablet Take 1 tablet (10 mg total) by  mouth 2 (two) times daily. 60 tablet 11  . ranitidine (ZANTAC) 150 MG tablet TAKE 1 TABLET BY MOUTH TWICE DAILY 180 tablet 1  . telmisartan (MICARDIS) 80 MG tablet Take 1 tablet (80 mg total) by mouth daily. 90 tablet 1   No current facility-administered medications on file prior to visit.     Past Medical History:  Diagnosis Date  . Achilles tendonitis   . Anemia, iron deficiency 07/23/2012  . Asthma   . Cancer ([place]) 05/2012   plasma cell myeloma  . GERD (gastroesophageal reflux disease)   . Hyperlipidemia   . Hypertension   . Multiple sclerosis ([place])   . OA (osteoarthritis) of knee   . Obesity   . 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 S/P  radiation therapy 08/12/12 - 09/21/12   left Anterior 2nd Rib Lesion / 50.4 GY / 28 Fractions  . Vertigo   . Vitamin D deficiency     Past Surgical History:  Procedure Laterality Date  . ABDOMINAL HYSTERECTOMY     fibroids  . BONE BIOPSY  05/17/12   Left 2nd Rib, Plasma Cell Myeloma  . BREAST EXCISIONAL BIOPSY Left    benign  . BREAST SURGERY     left breast biopsy-benign  . CHOLECYSTECTOMY     lap choley  . ROTATOR CUFF REPAIR      Social History   Socioeconomic History  . Marital status: Widowed    Spouse name: Not on file  . Number of children: 1  . Years of education: 39  . Highest education level: Not on file  Occupational History    Employer: RETIRED  . Occupation: Retired   Scientific laboratory technician  . Financial resource strain: Not hard at all  . Food insecurity:    Worry: Never true    Inability: Never true  . Transportation needs:    Medical: No    Non-medical: No  Tobacco Use  . Smoking status: Former Smoker    Packs/day: 0.10    Years: 2.00    Pack years: 0.20    Types: Cigarettes    Start date: 10/05/1976    Last attempt to quit: 04/19/1979    Years since quitting: 39.1  . Smokeless tobacco: Never Used  . Tobacco comment: quit 35 years ago   Substance and Sexual Activity  . Alcohol use: No    Alcohol/week: 0.0 standard drinks  . Drug use: No  . Sexual activity: Never  Lifestyle  . Physical activity:    Days per week: 0 days    Minutes per session: 0 min  . Stress: Not at all  Relationships  . Social connections:    Talks on phone: More than three times a week    Gets together: More than three times a week    Attends religious service: More than 4 times per year    Active member of club or organization: Yes    Attends meetings of clubs or organizations: More than 4 times per year    Relationship status: Widowed  Other Topics Concern  . 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    Patient is  retired.    Patient is widowed.    Patient is right handed.    Patient has a high school education.    Patient has 1 child.     Family History  Problem Relation Age  of Onset  . Dementia Mother   . Hypertension Mother   . Lung cancer Father        +tobacco  . Diabetes Sister   . Colon cancer Brother        youngest brother  . Diabetes Sister        sister # 34  . Diabetes Brother   . Stomach cancer Neg Hx   . Pancreatic cancer Neg Hx     Review of Systems  Constitutional: Negative for chills and fever.  Respiratory: Negative for cough, shortness of breath and wheezing.   Cardiovascular: Negative for chest pain, palpitations and leg swelling.  Gastrointestinal: Positive for abdominal pain (upper abdomen). Negative for constipation, diarrhea and nausea.       Occ GERD  Neurological: Positive for light-headedness and headaches (daily, frontal).       Objective:   Vitals:   05/26/18 1252  BP: (!) 172/84  Pulse: 62  Resp: 16  Temp: 97.9 F (36.6 C)  SpO2: 96%   BP Readings from Last 3 Encounters:  05/26/18 (!) 172/84  05/19/18 (!) 205/97  05/10/18 (!) 162/82   Wt Readings from Last 3 Encounters:  05/26/18 181 lb 12.8 oz (82.5 kg)  05/19/18 185 lb 13.6 oz (84.3 kg)  05/10/18 185 lb 12.8 oz (84.3 kg)   Body mass index is 33.25 kg/m.   Physical Exam    Constitutional: Appears well-developed and well-nourished. No distress.  HENT:  Head: Normocephalic and atraumatic.  Neck: Neck supple. No tracheal deviation present. No thyromegaly present.  No cervical lymphadenopathy Cardiovascular: Normal rate, regular rhythm and normal heart sounds.  Pronounced heart beat.  1/6 systolic murmur heard. No carotid bruit .  No edema Pulmonary/Chest: Effort normal and breath sounds normal. No respiratory distress. No has no wheezes. No rales.  Abdomen: soft, minimal tender in epigastric region w/o rebound or guarding Skin: Skin is warm and dry. Not diaphoretic.  Psychiatric:  Normal mood and affect. Behavior is normal.      Assessment & Plan:    See Problem List for Assessment and Plan of chronic medical problems.

## 2018-05-26 ENCOUNTER — Ambulatory Visit (INDEPENDENT_AMBULATORY_CARE_PROVIDER_SITE_OTHER): Payer: Medicare Other | Admitting: Internal Medicine

## 2018-05-26 ENCOUNTER — Encounter: Payer: Self-pay | Admitting: Internal Medicine

## 2018-05-26 VITALS — BP 172/84 | HR 62 | Temp 97.9°F | Resp 16 | Ht 62.0 in | Wt 181.8 lb

## 2018-05-26 DIAGNOSIS — R51 Headache: Secondary | ICD-10-CM | POA: Diagnosis not present

## 2018-05-26 DIAGNOSIS — I10 Essential (primary) hypertension: Secondary | ICD-10-CM | POA: Diagnosis not present

## 2018-05-26 DIAGNOSIS — R1010 Upper abdominal pain, unspecified: Secondary | ICD-10-CM | POA: Diagnosis not present

## 2018-05-26 DIAGNOSIS — R519 Headache, unspecified: Secondary | ICD-10-CM

## 2018-05-26 MED ORDER — HYDRALAZINE HCL 10 MG PO TABS: 10.0000 mg | ORAL_TABLET | Freq: Two times a day (BID) | ORAL | 5 refills | Status: DC

## 2018-05-26 NOTE — Assessment & Plan Note (Signed)
Elevated BP possible contributing, but she had just been place on aricept and namenda prior to her symptoms starting and that is likely contributing Advised discussing with neuro if she should stop to see if her symptoms go away Will also get BP better controlled

## 2018-05-26 NOTE — Assessment & Plan Note (Signed)
Not controlled Start hydralazine 10 mg BID continue amlodipine and telmisartan Monitor BP if possible Fu in 2 weeks

## 2018-05-26 NOTE — Patient Instructions (Signed)
Your blood pressure is high - continue your current medications and start hydralazine 10 mg twice daily.   If you can monitor your BP at home that would be ideal.  Keep a record of your readings.   Your headache and stomach pain may be side effects from the memory medications your neurologist put you on.  You may want to take to her about stopping the medications to see if the symptoms go away.     Follow up with me in 2 weeks.

## 2018-05-26 NOTE — Telephone Encounter (Signed)
Left message for patient's daughter to return my call.

## 2018-05-26 NOTE — Assessment & Plan Note (Signed)
Having some upper abd pain ? Related to medication for memory - side effect It is difficult to get history from her - she is a poor historian She feels it is getting better  monitor for now

## 2018-05-26 NOTE — Telephone Encounter (Signed)
Spoke to patient's daughter - she started both memantine and donepezil at the same time.  The daughter says she remembers Dr. Krista Blue telling her to start one for a few weeks then the other.  This has been discussed with Dr. Krista Blue.  She has been instructed to stop both medications for a couple of days (unitl she feels better).  She should then restart memantine only for the next month.  Then try to add the donepezil.  Her daughter verbalized understanding and will go over this with her mother.  She will call us back if this plan does not resolve her symptoms.

## 2018-05-26 NOTE — Telephone Encounter (Signed)
Pt daughter(on [facility]) has called to inform that she believes the Namenda and Aricept are causing pt to have stomach pain and bad headaches, daughter is asking if this can be changed or it pt can just stop the medications.  Please call

## 2018-06-09 NOTE — Progress Notes (Signed)
Subjective:    Patient ID: Morgan Espinoza, female    DOB: [birth year], 67 y.o.   MRN: [id number]  HPI The patient is here for follow up of elevated BP.  She is here with her brother.  History is extremely limited-the patient has dementia and her brother is not able to add much to the history.  Hypertension: She was here two weeks ago and her blood pressure was elevated.  Hydralazine 10 mg twice daily was started.  She was continued on her amlodipine and telmisartan.  She is here for follow-up.  She is taking her medication daily as prescribed.    She denies any chest pain, palpitations and shortness of breath.  She is not having any leg edema.  She does not check her blood pressure at home.  Frequent headaches: She was here 2 weeks ago she was also having frequent headaches.  At that time we are unsure if it was related to her elevated blood pressure or new medications for dementia-Aricept and Namenda.  I did recommend discussing the headaches with her neurologist-after reviewing the chart her daughter had done this 1 month ago.  The patient is not sure of the results of adjusting her medication.  She has frequent headaches, but not daily.  She takes tylenol and it helps.    Medications and allergies reviewed with patient and updated if appropriate.  Patient Active Problem List   Diagnosis Date Noted  . Nonintractable headache 05/26/2018  . CKD (chronic kidney disease) stage 3, GFR 30-59 ml/min (HCC) 05/10/2018  . Memory loss 05/06/2018  . Abnormal finding on MRI of brain 05/06/2018  . Dementia with behavioral disturbance ([place])   . Pericardial effusion 03/01/2018  . Valvular heart disease 12/29/2017  . Abdominal pain 11/03/2017  . Lytic bone lesions on xray 06/03/2016  . Primary osteoarthritis of both knees 10/12/2014  . B12 deficiency 10/12/2014  . 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 Hyperlipidemia   . Vertigo   . Vitamin D deficiency   . Obesity   . Anemia, iron deficiency 07/23/2012    . 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 Hypertension 06/05/2011  . Asthma 06/05/2011  . GERD (gastroesophageal reflux disease) 06/05/2011  . MS (multiple sclerosis) ([place]) 06/05/2011    Current Outpatient Medications on File Prior to Visit  Medication Sig Dispense Refill  . acetaminophen (TYLENOL) 500 MG tablet Take 1,000 mg by mouth every 6 (six) hours as needed for moderate pain.    Marland Kitchen albuterol (PROVENTIL HFA;VENTOLIN HFA) 108 (90 Base) MCG/ACT inhaler Inhale 2 puffs into the lungs every 6 (six) hours as needed (cough, shortness of breath or wheezing.). 1 Inhaler 5  . amLODipine (NORVASC) 10 MG tablet TAKE 1 TABLET BY MOUTH ONCE DAILY 90 tablet 1  . aspirin 81 MG tablet Take 81 mg by mouth daily.    Marland Kitchen atorvastatin (LIPITOR) 10 MG tablet Take 1 tablet (10 mg total) by mouth daily. 90 tablet 1  . cholecalciferol (VITAMIN D) 1000 units tablet Take 1,000 Units by mouth daily.    . COMBIGAN 0.2-0.5 % ophthalmic solution Place 1 drop into both eyes daily.   3  . donepezil (ARICEPT) 10 MG tablet Take 1 tablet (10 mg total) by mouth at bedtime. 30 tablet 11  . hydrALAZINE (APRESOLINE) 10 MG tablet Take 1 tablet (10 mg total) by mouth 2 (two) times daily. 60 tablet 5  . memantine (NAMENDA) 10 MG tablet Take 1 tablet (10 mg total) by mouth 2 (two) times daily.  60 tablet 11  . ranitidine (ZANTAC) 150 MG tablet TAKE 1 TABLET BY MOUTH TWICE DAILY 180 tablet 1  . telmisartan (MICARDIS) 80 MG tablet Take 1 tablet (80 mg total) by mouth daily. 90 tablet 1   No current facility-administered medications on file prior to visit.     Past Medical History:  Diagnosis Date  . Achilles tendonitis   . Anemia, iron deficiency 07/23/2012  . Asthma   . Cancer ([place]) 05/2012   plasma cell myeloma  . GERD (gastroesophageal reflux disease)   . Hyperlipidemia   . Hypertension   . Multiple sclerosis ([place])   . OA (osteoarthritis) of knee   . Obesity   . 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  S/P radiation therapy 08/12/12 - 09/21/12   left Anterior 2nd Rib Lesion / 50.4 GY / 28 Fractions  . Vertigo   . Vitamin D deficiency     Past Surgical History:  Procedure Laterality Date  . ABDOMINAL HYSTERECTOMY     fibroids  . BONE BIOPSY  05/17/12   Left 2nd Rib, Plasma Cell Myeloma  . BREAST EXCISIONAL BIOPSY Left    benign  . BREAST SURGERY     left breast biopsy-benign  . CHOLECYSTECTOMY     lap choley  . ROTATOR CUFF REPAIR      Social History   Socioeconomic History  . Marital status: Widowed    Spouse name: Not on file  . Number of children: 1  . Years of education: 45  . Highest education level: Not on file  Occupational History    Employer: RETIRED  . Occupation: Retired   Scientific laboratory technician  . Financial resource strain: Not hard at all  . Food insecurity:    Worry: Never true    Inability: Never true  . Transportation needs:    Medical: No    Non-medical: No  Tobacco Use  . Smoking status: Former Smoker    Packs/day: 0.10    Years: 2.00    Pack years: 0.20    Types: Cigarettes    Start date: 10/05/1976    Last attempt to quit: 04/19/1979    Years since quitting: 39.1  . Smokeless tobacco: Never Used  . Tobacco comment: quit 35 years ago   Substance and Sexual Activity  . Alcohol use: No    Alcohol/week: 0.0 standard drinks  . Drug use: No  . Sexual activity: Never  Lifestyle  . Physical activity:    Days per week: 0 days    Minutes per session: 0 min  . Stress: Not at all  Relationships  . Social connections:    Talks on phone: More than three times a week    Gets together: More than three times a week    Attends religious service: More than 4 times per year    Active member of club or organization: Yes    Attends meetings of clubs or organizations: More than 4 times per year    Relationship status: Widowed  Other Topics Concern  . 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     Patient is retired.    Patient is widowed.    Patient is right handed.    Patient has a high school education.    Patient has 1 child.     Family History  Problem Relation Age of Onset  . Dementia  Mother   . Hypertension Mother   . Lung cancer Father        +tobacco  . Diabetes Sister   . Colon cancer Brother        youngest brother  . Diabetes Sister        sister # 37  . Diabetes Brother   . Stomach cancer Neg Hx   . Pancreatic cancer Neg Hx     Review of Systems  Constitutional: Negative for chills and fever.  Respiratory: Negative for cough, shortness of breath and wheezing.   Cardiovascular: Negative for chest pain, palpitations and leg swelling.  Neurological: Positive for light-headedness and headaches.       Objective:   Vitals:   06/10/18 1002  BP: (!) 152/80  Pulse: 95  Resp: 16  Temp: 98.5 F (36.9 C)  SpO2: 96%   BP Readings from Last 3 Encounters:  06/10/18 (!) 152/80  05/26/18 (!) 172/84  05/19/18 (!) 205/97   Wt Readings from Last 3 Encounters:  06/10/18 179 lb (81.2 kg)  05/26/18 181 lb 12.8 oz (82.5 kg)  05/19/18 185 lb 13.6 oz (84.3 kg)   Body mass index is 32.74 kg/m.   Physical Exam    Constitutional: Appears well-developed and well-nourished. No distress.  HENT:  Head: Normocephalic and atraumatic.  Neck: Neck supple. No tracheal deviation present. No thyromegaly present.  No cervical lymphadenopathy Cardiovascular: Normal rate, regular rhythm and normal heart sounds.   No murmur heard. No carotid bruit .  No edema Pulmonary/Chest: Effort normal and breath sounds normal. No respiratory distress. No has no wheezes. No rales.  Skin: Skin is warm and dry. Not diaphoretic.  Psychiatric: Normal mood and affect. Behavior is normal.      Assessment & Plan:    See Problem List for Assessment and Plan of chronic medical problems.

## 2018-06-10 ENCOUNTER — Ambulatory Visit (INDEPENDENT_AMBULATORY_CARE_PROVIDER_SITE_OTHER): Payer: Medicare Other | Admitting: Internal Medicine

## 2018-06-10 ENCOUNTER — Encounter: Payer: Self-pay | Admitting: Internal Medicine

## 2018-06-10 ENCOUNTER — Telehealth: Payer: Self-pay

## 2018-06-10 DIAGNOSIS — I10 Essential (primary) hypertension: Secondary | ICD-10-CM

## 2018-06-10 DIAGNOSIS — R51 Headache: Secondary | ICD-10-CM

## 2018-06-10 DIAGNOSIS — R519 Headache, unspecified: Secondary | ICD-10-CM

## 2018-06-10 MED ORDER — HYDRALAZINE HCL 25 MG PO TABS: 25.0000 mg | ORAL_TABLET | Freq: Two times a day (BID) | ORAL | 5 refills | Status: DC

## 2018-06-10 NOTE — Assessment & Plan Note (Addendum)
She is still having headaches, but they are better.  They do not occur daily and improve with Tylenol She is a poor historian and history is very limited We will call her daughter to see if there was improvement with holding her Namenda and Aricept Headaches and likely related to blood pressure CT scan of the head done recently

## 2018-06-10 NOTE — Assessment & Plan Note (Signed)
Blood pressure has improved, but is still elevated Ideally this should be checked at home to giving me more idea of her blood pressure controlled Will increase hydralazine to 25 mg twice daily Continue amlodipine and telmisartan Patient will hopefully be moving to assisted living facility at some point her blood pressure can be monitored more regularly Follow-up in 1 month, sooner if needed

## 2018-06-10 NOTE — Telephone Encounter (Signed)
Tried calling daughter. No answer and VM had a lot of static and I was unable to leave one. Will try back later.

## 2018-06-10 NOTE — Patient Instructions (Addendum)
Your BP is better, but still elevated.  Increase hydralazine to 25 mg twice daily.  A new prescription was sent to your pharmacy.     We will call your daughter   Follow up in one month

## 2018-06-10 NOTE — Telephone Encounter (Signed)
Spoke with daughter and asked about the [facility] form and if she heard anything about it. She stated that the social worker never received it. I refaxed the paper work. Daughter wanted to make an appointment the week of thanksgiving bc she would be in town to discuss the [facility] and placement for pt. She also stated she planned to take pt back home with her in [place] for 30 days when she came the week of thanksgiving until everything was straightened out. Just wanted you to be aware.

## 2018-06-10 NOTE — Telephone Encounter (Signed)
Copied from [place] [id number]. Topic: General - Other >> Jun 10, 2018 11:21 AM Keene Breath wrote: Reason for CRM: Patient's daughter called to speak with the doctor or nurse.  Patient stated that after her appointment today, doctor requested that the daughter call the doctor's office.  She is not exactly sure whey the doctor wants to speak with her.  Please call daughter Ronnie Doss) back at [phone]

## 2018-06-11 ENCOUNTER — Telehealth: Payer: Self-pay

## 2018-06-11 NOTE — Telephone Encounter (Signed)
Copied from [place] [id number]. Topic: General - Other >> Jun 11, 2018  1:42 PM Yvette Rack wrote: Reason for CRM: Pt daughter Deloris Radford Pax called in requesting to speak with Lovena Le. Deloris request call back from [place]. Cb# [phone]

## 2018-06-11 NOTE — Telephone Encounter (Signed)
LVM for pt call back in regards to notes below.

## 2018-06-14 NOTE — Telephone Encounter (Signed)
Spoke with daughter and she stated that the level of care had to be entered on the [facility] form for the social worker to accept it. I advised I will double check with portion of the form with Dr. Quay Burow and refax it with the appropriate level of care marked. She also advised that I send the original copy of the form to you. Will make copy and send to daughter in the mail per daughters request.

## 2018-06-14 NOTE — Telephone Encounter (Signed)
Updated form has been refaxed. Daughter is aware. Has been sent to daughter in the mail.

## 2018-06-14 NOTE — Telephone Encounter (Signed)
Pt daughter called and would like a call back. She is off all day. [phone]. Questions regarding medication. Please advise.

## 2018-06-24 NOTE — Telephone Encounter (Signed)
Dr. Krista Blue placed 11 refills on both prescriptions when she sent them to the pharmacy.  I have called the patient's daughter back and left her a message to contact the pharmacy when they are ready to refill the prescriptions.  Provided our number to call back with any other questions.

## 2018-06-24 NOTE — Telephone Encounter (Signed)
Pt daughter(on DPR-Delores Radford Pax) has called re: the medication that pt is about to start back on 11-18. Daughter states pt only has about 2 pills remaining and would like to know if refills can be called into the pharmacy so pt does not run out once back on. [place], [place]

## 2018-07-04 NOTE — Progress Notes (Signed)
Subjective:    Patient ID: Morgan Espinoza, female    DOB: Jul 01, 1951, 67 y.o.   MRN: [id number]  HPI The patient is here for an acute visit.  Her daughter is here with her.    She is taking a week off of work to care for her mother - it is called critical care time.  The week will be next week Dec 3-7.  She needs a form filled out for work.    Nausea, diarrhea, abdominal cramping, headaches:  She had stopped her Aricept for one month and then restarted it.  Her daughter was not with her when she stopped taking the medication and due to her dementia it is not really known if her symptoms improved or not.  She is now taking both medications.  She is having symptoms intermittently - she is not able to tell us more detail due to her dementia.    Headaches:  She continues to have headaches daily.  She is taking her BP medications.  She is not able to check her BP at home.     Left eye film:  She has a feeling of a film over her left eye. She denies pain, changes in vision, eye discharge, itching of the eye or floaters.  The sensation of the film comes and goes.    Her daughter plans on taking her back to [place] for approximately 1-possibly longer months.  At this point hopefully she will be able to be placed in a memory care unit in this area.  She will be able to better monitor her symptoms when she is with her.     Medications and allergies reviewed with patient and updated if appropriate.  Patient Active Problem List   Diagnosis Date Noted  . Nonintractable headache 05/26/2018  . CKD (chronic kidney disease) stage 3, GFR 30-59 ml/min (HCC) 05/10/2018  . Memory loss 05/06/2018  . Abnormal finding on MRI of brain 05/06/2018  . Dementia with behavioral disturbance ([place])   . Pericardial effusion 03/01/2018  . Valvular heart disease 12/29/2017  . Abdominal pain 11/03/2017  . Lytic bone lesions on xray 06/03/2016  . Primary osteoarthritis of both knees 10/12/2014  . B12 deficiency  10/12/2014  . 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 Hyperlipidemia   . Vertigo   . Vitamin D deficiency   . Obesity   . Anemia, iron deficiency 07/23/2012  . 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 Hypertension 06/05/2011  . Asthma 06/05/2011  . GERD (gastroesophageal reflux disease) 06/05/2011  . MS (multiple sclerosis) ([place]) 06/05/2011    Current Outpatient Medications on File Prior to Visit  Medication Sig Dispense Refill  . acetaminophen (TYLENOL) 500 MG tablet Take 1,000 mg by mouth every 6 (six) hours as needed for moderate pain.    Marland Kitchen albuterol (PROVENTIL HFA;VENTOLIN HFA) 108 (90 Base) MCG/ACT inhaler Inhale 2 puffs into the lungs every 6 (six) hours as needed (cough, shortness of breath or wheezing.). 1 Inhaler 5  . amLODipine (NORVASC) 10 MG tablet TAKE 1 TABLET BY MOUTH ONCE DAILY 90 tablet 1  . aspirin 81 MG tablet Take 81 mg by mouth daily.    Marland Kitchen atorvastatin (LIPITOR) 10 MG tablet Take 1 tablet (10 mg total) by mouth daily. 90 tablet 1  . cholecalciferol (VITAMIN D) 1000 units tablet Take 1,000 Units by mouth daily.    . COMBIGAN 0.2-0.5 % ophthalmic solution Place 1 drop into both eyes daily.   3  . donepezil (ARICEPT) 10 MG  tablet Take 1 tablet (10 mg total) by mouth at bedtime. 30 tablet 11  . hydrALAZINE (APRESOLINE) 25 MG tablet Take 1 tablet (25 mg total) by mouth 2 (two) times daily. 60 tablet 5  . memantine (NAMENDA) 10 MG tablet Take 1 tablet (10 mg total) by mouth 2 (two) times daily. 60 tablet 11  . ranitidine (ZANTAC) 150 MG tablet TAKE 1 TABLET BY MOUTH TWICE DAILY 180 tablet 1  . telmisartan (MICARDIS) 80 MG tablet Take 1 tablet (80 mg total) by mouth daily. 90 tablet 1   No current facility-administered medications on file prior to visit.     Past Medical History:  Diagnosis Date  . Achilles tendonitis   . Anemia, iron deficiency 07/23/2012  . Asthma   . Cancer ([place]) 05/2012   plasma cell myeloma  . GERD (gastroesophageal reflux disease)   .  Hyperlipidemia   . Hypertension   . Multiple sclerosis ([place])   . OA (osteoarthritis) of knee   . Obesity   . 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 S/P radiation therapy 08/12/12 - 09/21/12   left Anterior 2nd Rib Lesion / 50.4 GY / 28 Fractions  . Vertigo   . Vitamin D deficiency     Past Surgical History:  Procedure Laterality Date  . ABDOMINAL HYSTERECTOMY     fibroids  . BONE BIOPSY  05/17/12   Left 2nd Rib, Plasma Cell Myeloma  . BREAST EXCISIONAL BIOPSY Left    benign  . BREAST SURGERY     left breast biopsy-benign  . CHOLECYSTECTOMY     lap choley  . ROTATOR CUFF REPAIR      Social History   Socioeconomic History  . Marital status: Widowed    Spouse name: Not on file  . Number of children: 1  . Years of education: 68  . Highest education level: Not on file  Occupational History    Employer: RETIRED  . Occupation: Retired   Scientific laboratory technician  . Financial resource strain: Not hard at all  . Food insecurity:    Worry: Never true    Inability: Never true  . Transportation needs:    Medical: No    Non-medical: No  Tobacco Use  . Smoking status: Former Smoker    Packs/day: 0.10    Years: 2.00    Pack years: 0.20    Types: Cigarettes    Start date: 10/05/1976    Last attempt to quit: 04/19/1979    Years since quitting: 39.2  . Smokeless tobacco: Never Used  . Tobacco comment: quit 35 years ago   Substance and Sexual Activity  . Alcohol use: No    Alcohol/week: 0.0 standard drinks  . Drug use: No  . Sexual activity: Never  Lifestyle  . Physical activity:    Days per week: 0 days    Minutes per session: 0 min  . Stress: Not at all  Relationships  . Social connections:    Talks on phone: More than three times a week    Gets together: More than three times a week    Attends religious service: More than 4 times per year    Active member of club or organization: Yes    Attends meetings of clubs or organizations: More than 4 times per year     Relationship status: Widowed  Other Topics Concern  . Not on file  Social History Narrative   Retired: Recruitment consultant for 28 years  Patient lives at home alone.    Patient is retired.    Patient is widowed.    Patient is right handed.    Patient has a high school education.    Patient has 1 child.     Family History  Problem Relation Age of Onset  . Dementia Mother   . Hypertension Mother   . Lung cancer Father        +tobacco  . Diabetes Sister   . Colon cancer Brother        youngest brother  . Diabetes Sister        sister # 60  . Diabetes Brother   . Stomach cancer Neg Hx   . Pancreatic cancer Neg Hx     Review of Systems  Eyes: Negative for pain, discharge, itching and visual disturbance.       Film over left eye  Respiratory: Negative for cough, shortness of breath and wheezing.   Cardiovascular: Negative for chest pain and palpitations.  Gastrointestinal: Positive for diarrhea. Negative for abdominal pain and nausea.  Neurological: Positive for headaches.       Objective:   Vitals:   07/05/18 1407  BP: (!) 150/68  Pulse: 82  Resp: 16  Temp: 98.2 F (36.8 C)  SpO2: 97%   BP Readings from Last 3 Encounters:  07/05/18 (!) 150/68  06/10/18 (!) 152/80  05/26/18 (!) 172/84   Wt Readings from Last 3 Encounters:  07/05/18 181 lb (82.1 kg)  06/10/18 179 lb (81.2 kg)  05/26/18 181 lb 12.8 oz (82.5 kg)   Body mass index is 33.11 kg/m.   Physical Exam  Constitutional: She appears well-developed and well-nourished. No distress.  HENT:  Head: Normocephalic and atraumatic.  Eyes: Pupils are equal, round, and reactive to light. Conjunctivae are normal. Right eye exhibits no discharge. Left eye exhibits no discharge. No scleral icterus.  Neck: Neck supple. No JVD present. No tracheal deviation present. No thyromegaly present.  Cardiovascular: Normal rate and regular rhythm.  Pulmonary/Chest: Effort normal and breath sounds normal. No respiratory distress. She  has no wheezes. She has no rales.  Abdominal: Soft. She exhibits no distension. There is no tenderness.  Musculoskeletal: She exhibits no edema.  Lymphadenopathy:    She has no cervical adenopathy.  Skin: Skin is warm and dry. She is not diaphoretic.           Assessment & Plan:    See Problem List for Assessment and Plan of chronic medical problems.

## 2018-07-05 ENCOUNTER — Ambulatory Visit: Payer: Medicare Other | Admitting: Internal Medicine

## 2018-07-05 ENCOUNTER — Encounter: Payer: Self-pay | Admitting: Internal Medicine

## 2018-07-05 DIAGNOSIS — R109 Unspecified abdominal pain: Secondary | ICD-10-CM | POA: Diagnosis not present

## 2018-07-05 DIAGNOSIS — H579 Unspecified disorder of eye and adnexa: Secondary | ICD-10-CM

## 2018-07-05 DIAGNOSIS — R519 Headache, unspecified: Secondary | ICD-10-CM

## 2018-07-05 DIAGNOSIS — I10 Essential (primary) hypertension: Secondary | ICD-10-CM | POA: Diagnosis not present

## 2018-07-05 DIAGNOSIS — F0391 Unspecified dementia with behavioral disturbance: Secondary | ICD-10-CM | POA: Diagnosis not present

## 2018-07-05 DIAGNOSIS — R51 Headache: Secondary | ICD-10-CM

## 2018-07-05 MED ORDER — HYDRALAZINE HCL 50 MG PO TABS: 50.0000 mg | ORAL_TABLET | Freq: Three times a day (TID) | ORAL | 5 refills | Status: DC

## 2018-07-05 NOTE — Assessment & Plan Note (Addendum)
Taking namenda and aricept  Some of her symptoms are likely from the above medications Her daughter will contact neuro to discuss adjusting the medications - since she is taking her mom back to [place] with her it will be easier for her to monitor her symptoms

## 2018-07-05 NOTE — Assessment & Plan Note (Signed)
?   Related to BP - will adjust medication  OR related to memory medications, which seem more likely Her daughter will monitor her BP and update [place] if it is not controlled She will contact neuro regarding holding her medications  And monitor her symptoms off the medications

## 2018-07-05 NOTE — Patient Instructions (Addendum)
  Medications reviewed and updated.  Changes include :   Increase hydralazine to 50 mg three times a day.   Your prescription(s) have been submitted to your pharmacy. Please take as directed and contact our office if you believe you are having problem(s) with the medication(s).  We will complete the forms you brought.

## 2018-07-05 NOTE — Assessment & Plan Note (Signed)
Associated with loose stools, nausea Likely related to memory medications  Will f/u with neuro If not medications may need GI evaluation

## 2018-07-05 NOTE — Assessment & Plan Note (Signed)
Not ideally controlled Increase hydralazine to 50 mg three times a day Continue other medications Monitor BP at home Her daughter will update me with her BP and call with questions or concerns.

## 2018-07-05 NOTE — Assessment & Plan Note (Signed)
Intermittent feeling of a film in the left eye  Will see her eye doctor

## 2018-07-06 DIAGNOSIS — Z0279 Encounter for issue of other medical certificate: Secondary | ICD-10-CM

## 2018-07-12 ENCOUNTER — Ambulatory Visit: Payer: Self-pay | Admitting: Internal Medicine

## 2018-07-19 ENCOUNTER — Telehealth: Payer: Self-pay | Admitting: Internal Medicine

## 2018-07-19 MED ORDER — FAMOTIDINE 20 MG PO TABS: 20.0000 mg | ORAL_TABLET | Freq: Two times a day (BID) | ORAL | 5 refills | Status: DC

## 2018-07-19 NOTE — Telephone Encounter (Signed)
Copied from [place] [id number]. Topic: General - Other >> Jul 19, 2018  9:21 AM Lennox Solders wrote: Reason for CRM:pt daughter delois is calling and ranitidine has been recalled. Please send another med into [facility]

## 2018-07-19 NOTE — Telephone Encounter (Signed)
pepcid sent

## 2018-07-20 ENCOUNTER — Telehealth: Payer: Self-pay

## 2018-07-20 NOTE — Telephone Encounter (Signed)
Copied from [place] [id number]. Topic: General - Other >> Jul 15, 2018 11:40 AM Yvette Rack wrote: Reason for CRM: Pt daughter Delois Radford Pax called to advise that paperwork will be faxed to Dr. Quay Burow. Delois requested that Dr. Quay Burow document that it is ok for pt to take over the counter Ibuprofen for headaches. Cb# [phone]

## 2018-07-20 NOTE — Telephone Encounter (Signed)
Forms received and faxed back.

## 2018-08-12 ENCOUNTER — Telehealth: Payer: Self-pay

## 2018-08-12 NOTE — Telephone Encounter (Signed)
Copied from [place] [phone]. Topic: General - Other >> Aug 12, 2018 11:54 AM Virl Axe D wrote: Reason for CRM:Pt's daughter Ronnie Doss stated that  Medicaid needs documentation showing proof that pt received her flu shot in September. Please advise. Fax# [phone] Attn: Ardis Hughs

## 2018-08-12 NOTE — Telephone Encounter (Signed)
Proof of flu shot has been faxed over to the number listed below.

## 2018-08-24 ENCOUNTER — Ambulatory Visit: Payer: Medicare Other | Admitting: Internal Medicine

## 2018-10-04 ENCOUNTER — Other Ambulatory Visit: Payer: Self-pay | Admitting: Internal Medicine

## 2018-10-04 NOTE — Telephone Encounter (Signed)
Copied from [place] [id number]. Topic: Quick Communication - Rx Refill/Question >> Oct 04, 2018  9:12 AM Nils Flack wrote: Medication: atorvastatin (LIPITOR) 10 MG tablet  Has the patient contacted their pharmacy? Yes.   (Agent: If no, request that the patient contact the pharmacy for the refill.) (Agent: If yes, when and what did the pharmacy advise?)  Preferred Pharmacy (with phone number or street name): [facility] [address]   Agent: Please be advised that RX refills may take up to 3 business days. We ask that you follow-up with your pharmacy.

## 2018-10-04 NOTE — Telephone Encounter (Signed)
Requested medication (s) are due for refill today: yes  Requested medication (s) are on the active medication list: yes  Last refill:  02/19/18  Future visit scheduled: no  Notes to clinic:  antilipid - statins failed.  Requested Prescriptions  Pending Prescriptions Disp Refills   atorvastatin (LIPITOR) 10 MG tablet 90 tablet 1    Sig: Take 1 tablet (10 mg total) by mouth daily.     Cardiovascular:  Antilipid - Statins Failed - 10/04/2018 11:58 AM      Failed - Total Cholesterol in normal range and within 360 days    Cholesterol  Date Value Ref Range Status  02/16/2018 223 (H) 0 - 200 mg/dL Final    Comment:    ATP III Classification       Desirable:  < 200 mg/dL               Borderline High:  200 - 239 mg/dL          High:  > = 240 mg/dL         Failed - LDL in normal range and within 360 days    LDL Cholesterol  Date Value Ref Range Status  02/16/2018 129 (H) 0 - 99 mg/dL Final         Passed - HDL in normal range and within 360 days    HDL  Date Value Ref Range Status  02/16/2018 78.40 >39.00 mg/dL Final         Passed - Triglycerides in normal range and within 360 days    Triglycerides  Date Value Ref Range Status  02/16/2018 78.0 0.0 - 149.0 mg/dL Final    Comment:    Normal:  <150 mg/dLBorderline High:  150 - 199 mg/dL         Passed - Patient is not pregnant      Passed - Valid encounter within last 12 months    Recent Outpatient Visits          3 months ago Essential hypertension   [place], Claudina Lick, MD   3 months ago Essential hypertension   [place], Stacy J, MD   4 months ago Nonintractable headache, unspecified chronicity pattern, unspecified headache type   [place], MD   4 months ago Dementia with behavioral disturbance, unspecified dementia type ([place])   [place], Claudina Lick, MD   7 months ago  Essential hypertension   [place], Claudina Lick, MD

## 2018-10-05 MED ORDER — ATORVASTATIN CALCIUM 10 MG PO TABS: 10.0000 mg | ORAL_TABLET | Freq: Every day | ORAL | 0 refills | Status: DC

## 2018-10-18 ENCOUNTER — Telehealth: Payer: Self-pay | Admitting: Internal Medicine

## 2018-10-18 MED ORDER — OMEPRAZOLE 20 MG PO CPDR: 20.0000 mg | DELAYED_RELEASE_CAPSULE | Freq: Every day | ORAL | 3 refills | Status: DC

## 2018-10-18 NOTE — Telephone Encounter (Signed)
Daughter aware.

## 2018-10-18 NOTE — Telephone Encounter (Signed)
Change to omeprazole 20 mg once a day.  rx sent

## 2018-10-18 NOTE — Telephone Encounter (Signed)
See note from pt's daughter, requesting alternative Rx to Pepcid, as it is on back order.

## 2018-10-18 NOTE — Telephone Encounter (Signed)
Copied from [place] [id number]. Topic: Quick Communication - Rx Refill/Question >> Oct 18, 2018  8:15 AM Reyne Dumas L wrote: Medication: famotidine (PEPCID) 20 MG tablet  Pt's daughter, Karena Addison, calling in.  States that pharmacy has told them this medication is on back order and they have requested an alternative for the time being but have not heard back from the office.  Pt and daughter need to know what to do.  Has the patient contacted their pharmacy? yes (Agent: If no, request that the patient contact the pharmacy for the refill.) (Agent: If yes, when and what did the pharmacy advise?)  Preferred Pharmacy (with phone number or street name): [facility], [place] [phone] (Phone) [phone] (Fax)  Agent: Please be advised that RX refills may take up to 3 business days. We ask that you follow-up with your pharmacy.

## 2018-10-27 IMAGING — CR DG CHEST 2V
2 series · 2 of 2 positions shown · non-contrast
Comparison: Chest x-ray 01/29/2016.

CLINICAL DATA: 67-year-old female with history of hallucinations.

EXAM:
CHEST - 2 VIEW

[w chest pa]
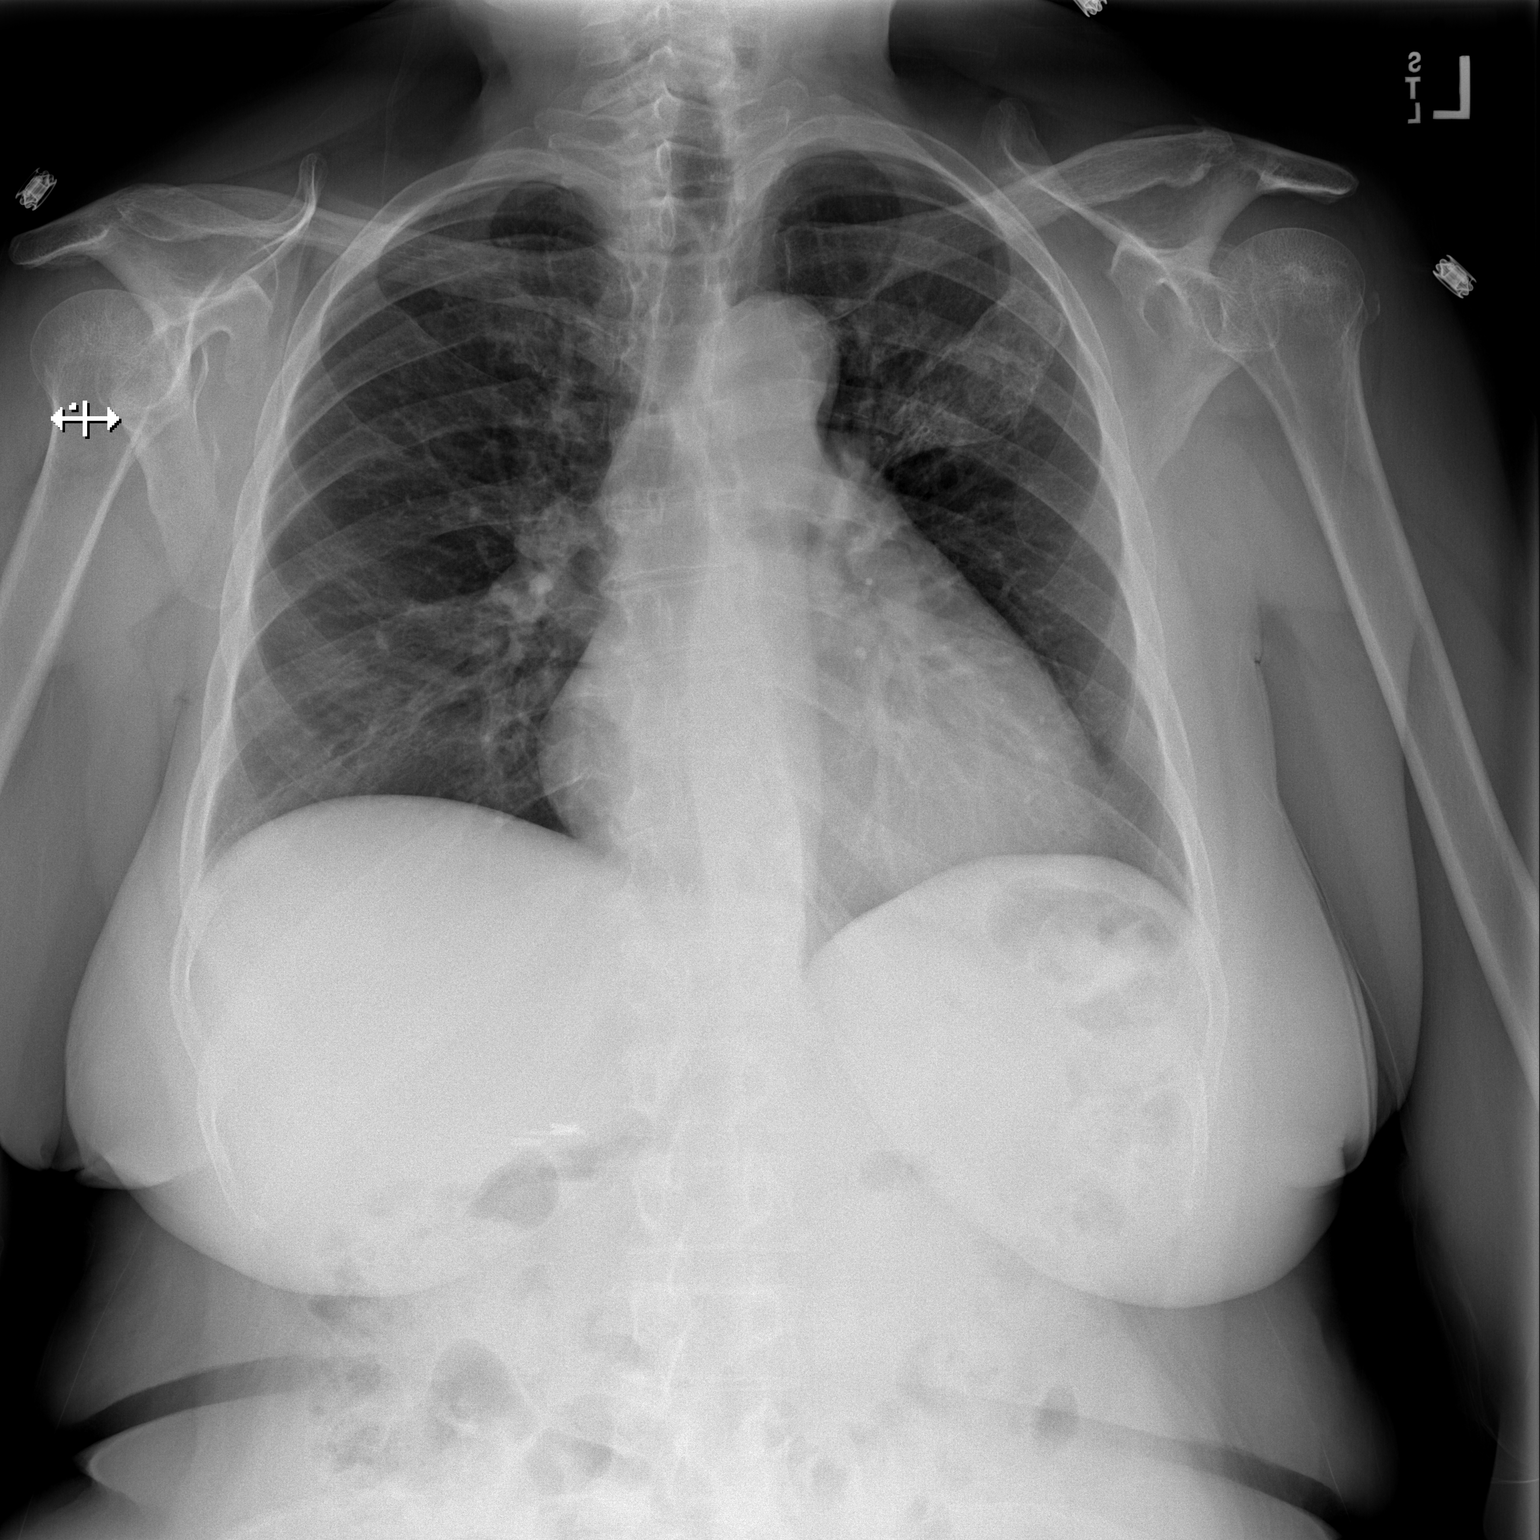

[w chest lat]
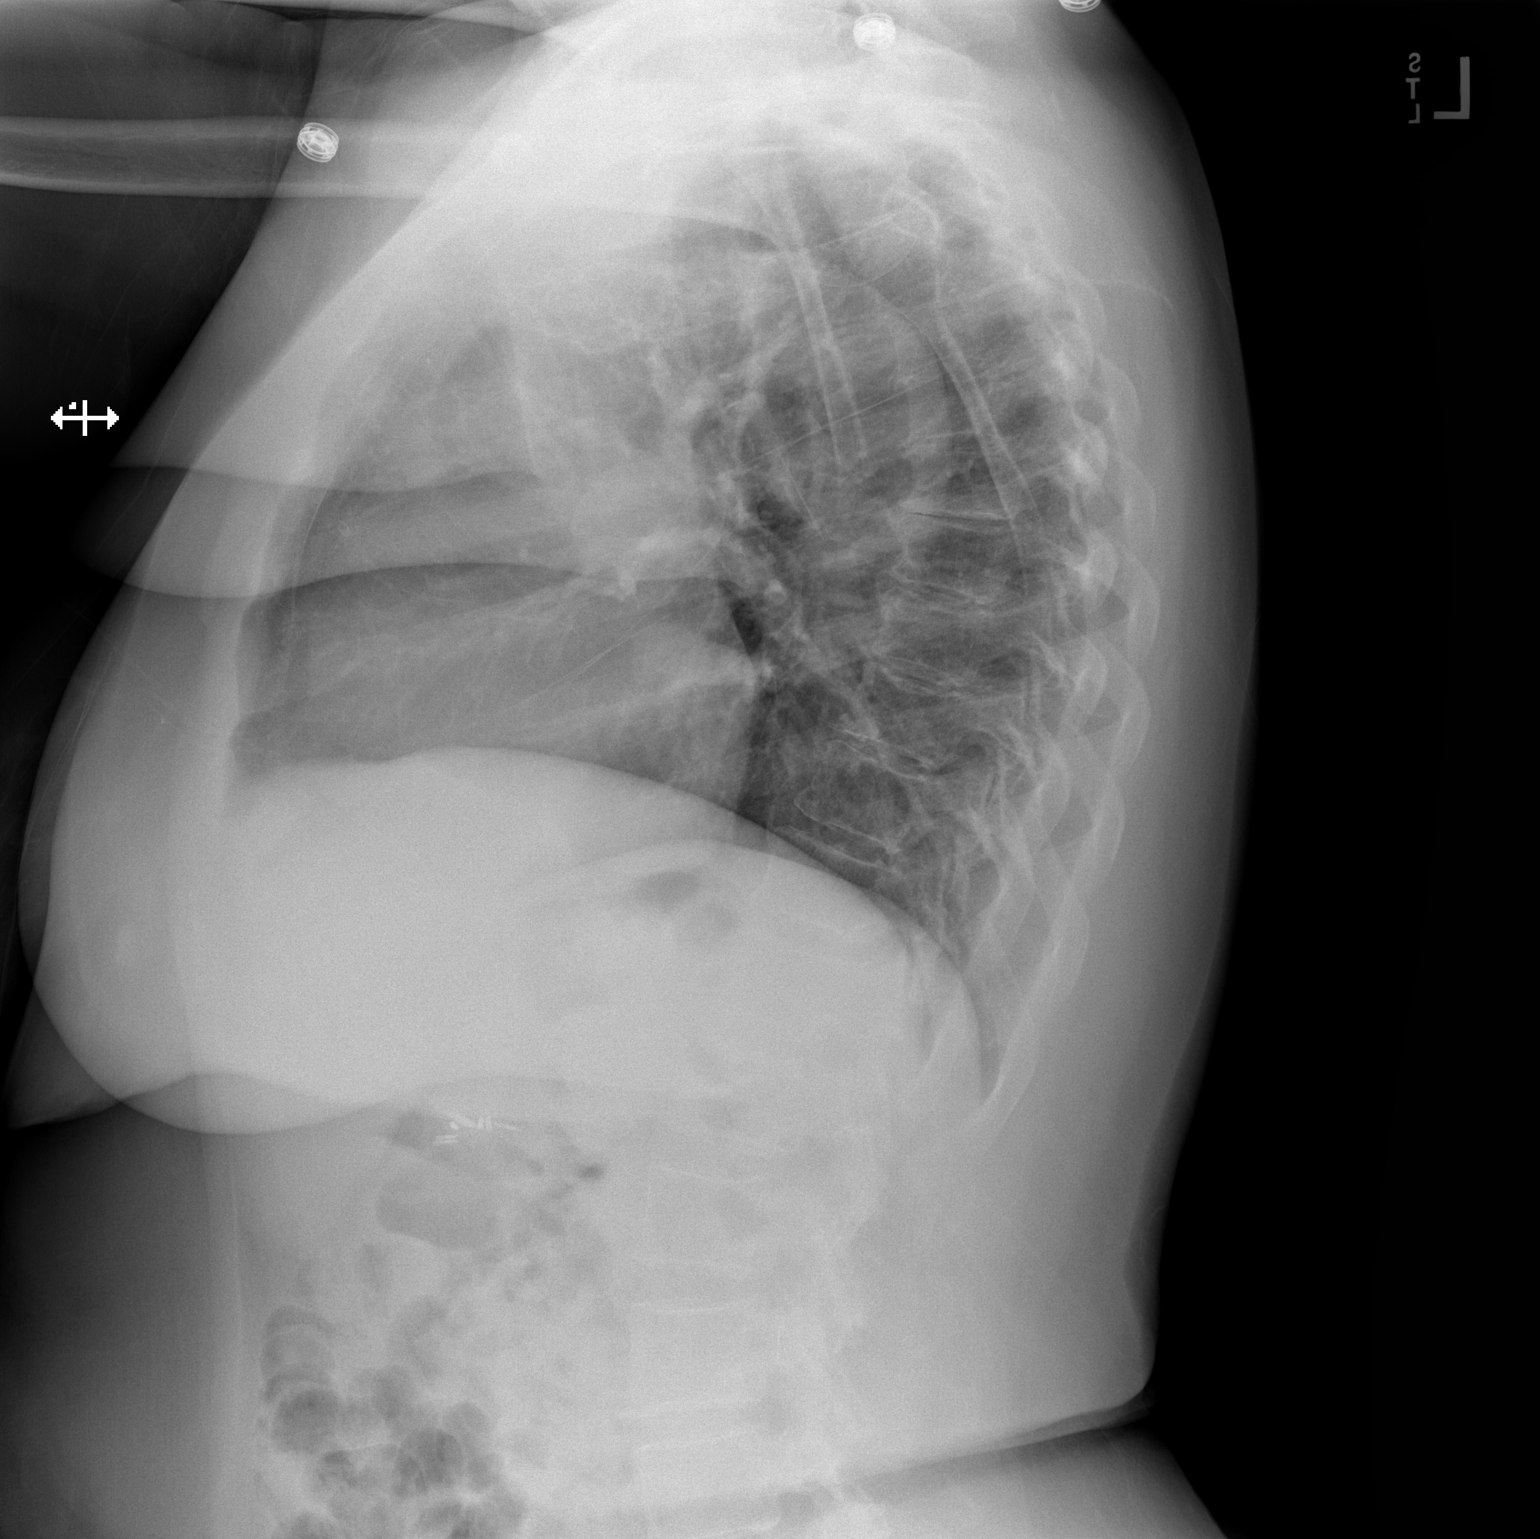

[2 of 2 positions shown; findings below may reference images not displayed]

FINDINGS: Lung volumes are normal. No consolidative airspace disease. No
pleural effusions. No pneumothorax. No pulmonary nodule or mass
noted. Pulmonary vasculature and the cardiomediastinal silhouette
are within normal limits. Atherosclerosis in the thoracic aorta.
Expansile area in the anterior aspect of the left second rib,
similar to numerous prior examinations, potentially an old healed
fracture or focus of fibrous dysplasia. Surgical clips projecting
over the right upper quadrant of the abdomen, likely from prior
cholecystectomy.
IMPRESSION: 1.  No radiographic evidence of acute cardiopulmonary disease.
2. Aortic atherosclerosis.

## 2018-11-04 ENCOUNTER — Other Ambulatory Visit: Payer: Self-pay

## 2018-12-05 IMAGING — CT CT HEAD W/O CM
4 series · 17 of 47 positions shown, 19 images · non-contrast
Comparison: April 10, 2018

CLINICAL DATA: Severe headaches

EXAM:
CT HEAD WITHOUT CONTRAST
TECHNIQUE: Contiguous axial images were obtained from the base of the skull
through the vertex without intravenous contrast.

[Series 2: head wo · axial · 0.39mm/px · z∈[-130,-10]mm · 7 of 34 slices shown, 9 images]
[im 5/34  brain]
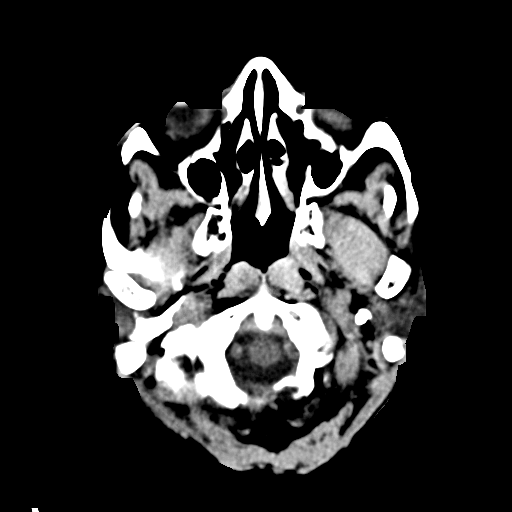
[im 5/34  bone]
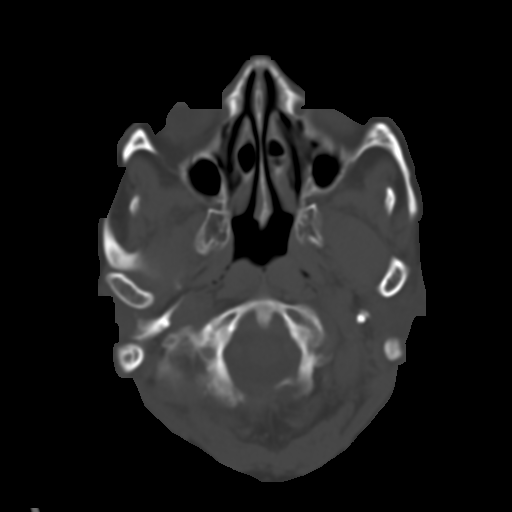
[im 9/34  brain]
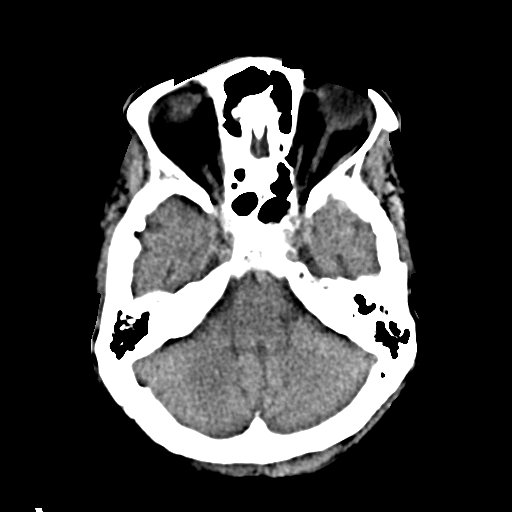
[im 13/34  brain]
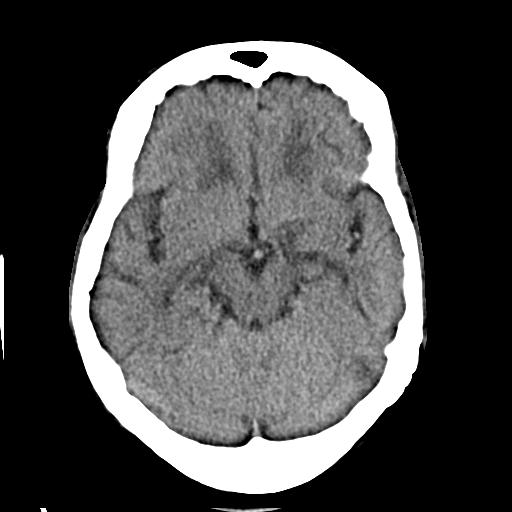
[im 17/34  brain]
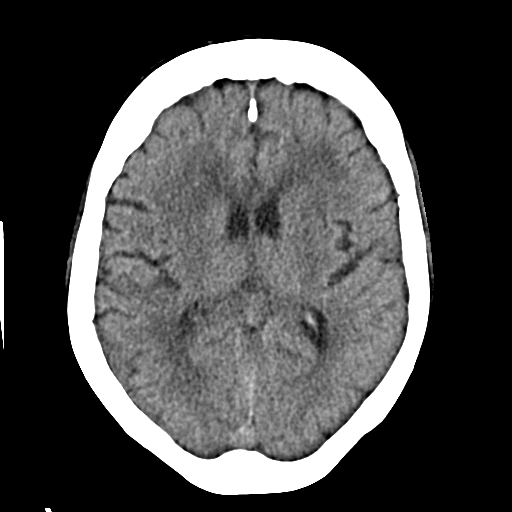
[im 21/34  brain]
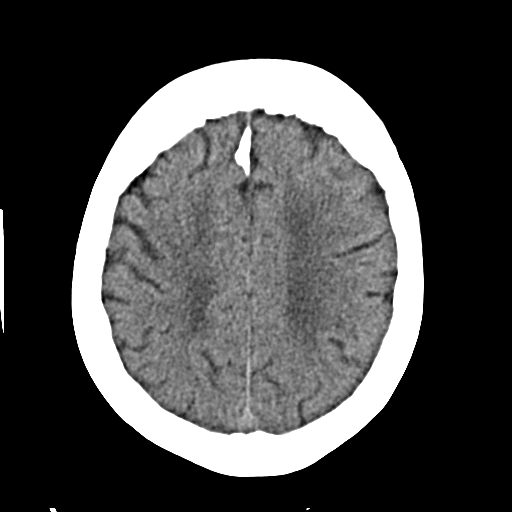
[im 21/34  bone]
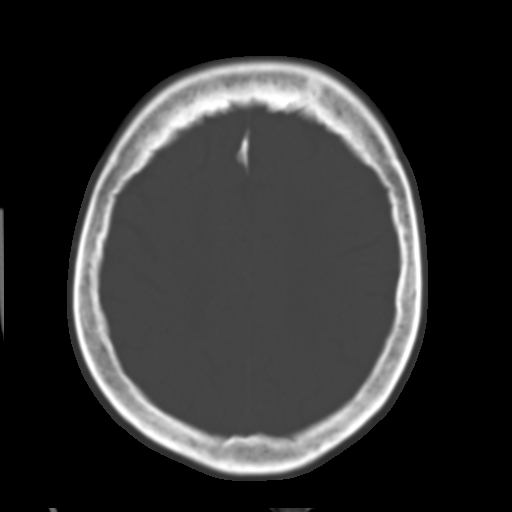
[im 25/34  brain]
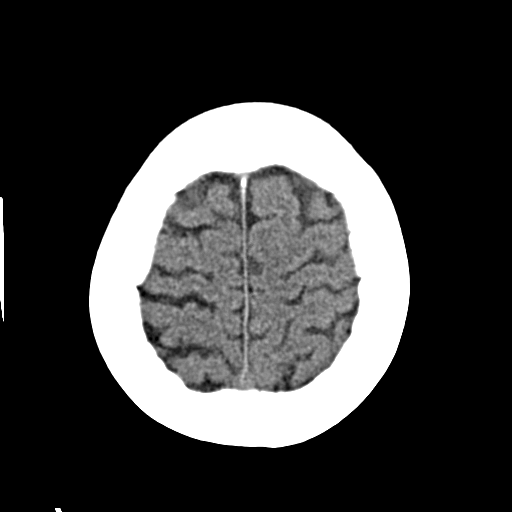
[im 29/34  brain]
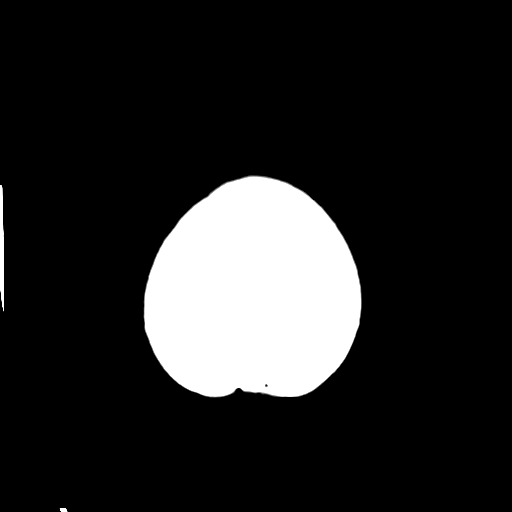

[Series 3: head bone · axial · 0.39mm/px · z∈[-134,-78]mm · 4 of 83 slices shown]
[im 9/83  bone]
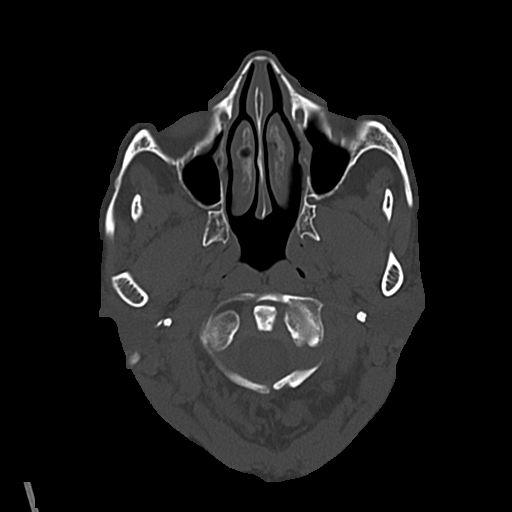
[im 17/83  bone]
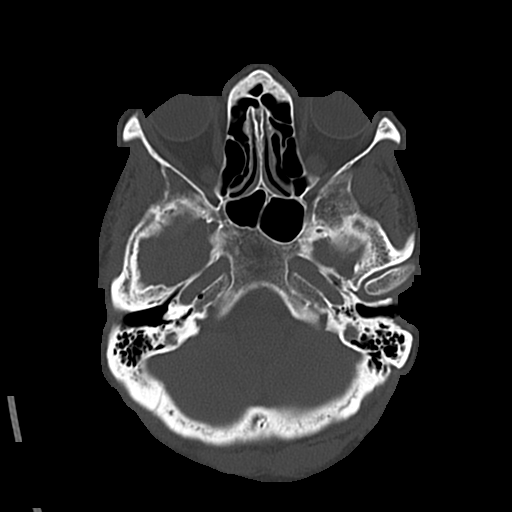
[im 25/83  bone]
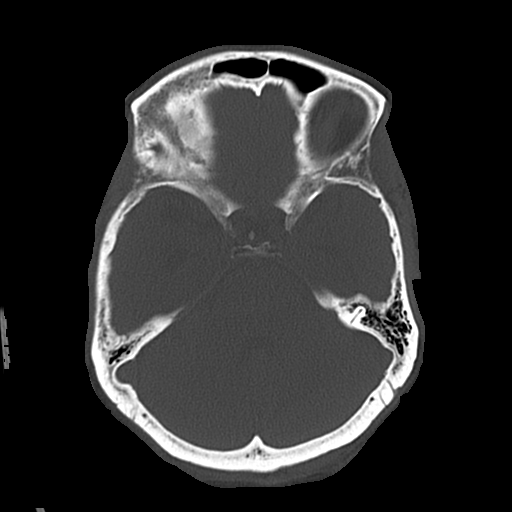
[im 37/83  bone]
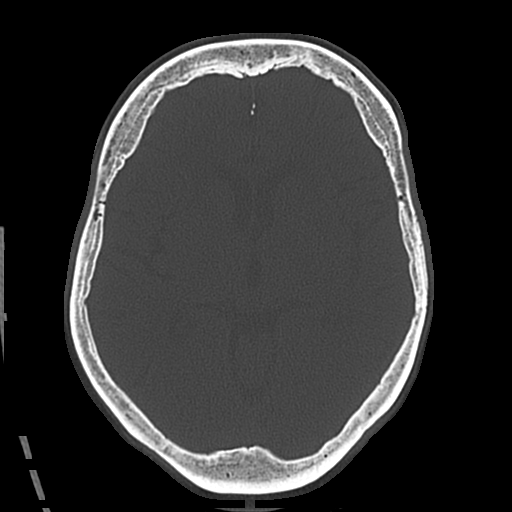

[Series 4: cor soft · coronal · 0.32mm/px · 3 of 67 slices shown]
[im 23/67  brain]
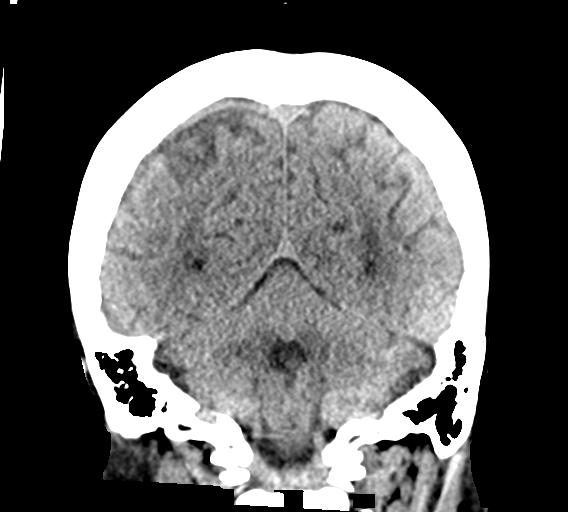
[im 30/67  brain]
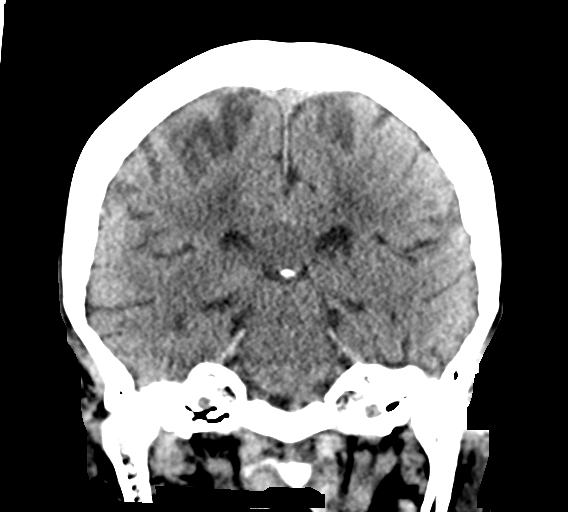
[im 37/67  brain]
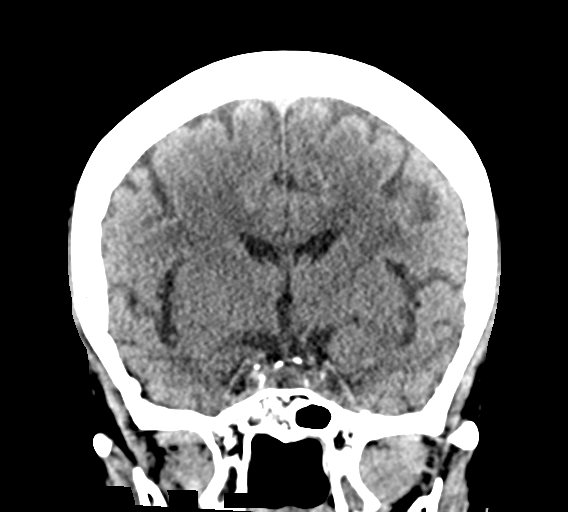

[Series 5: sag soft · sagittal · 0.32mm/px · 3 of 53 slices shown]
[im 18/53  brain]
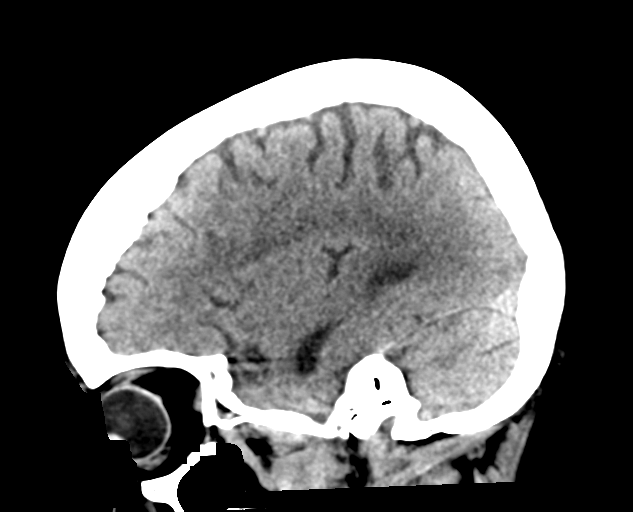
[im 27/53  brain]
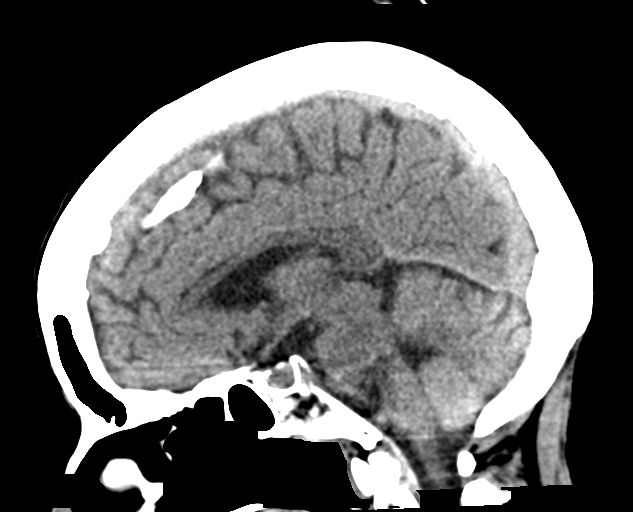
[im 35/53  brain]
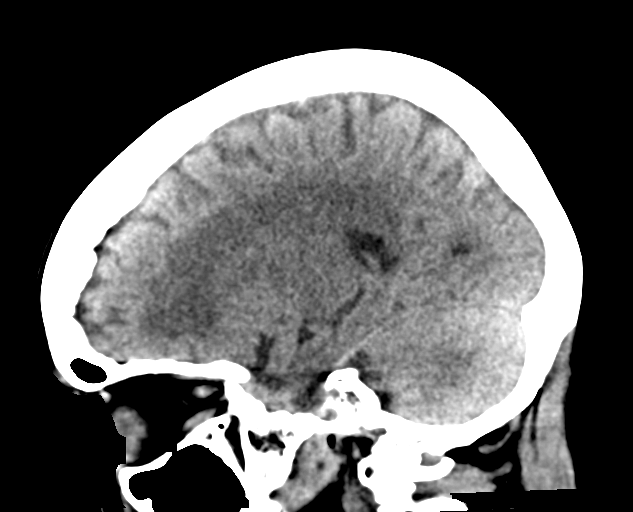

[17 of 47 positions shown; findings below may reference images not displayed]

FINDINGS: Brain: No evidence of acute infarction, hemorrhage, hydrocephalus,
extra-axial collection or mass lesion/mass effect. Bilateral
periventricular white matter small vessel ischemic changes
identified.

Vascular: No hyperdense vessel.

Skull: Normal. Negative for fracture or focal lesion.

Sinuses/Orbits: No acute finding.

Other: None.
IMPRESSION: No focal acute intracranial abnormality identified.

Bilateral periventricular white matter small vessel ischemic change.

## 2019-01-04 ENCOUNTER — Telehealth: Payer: Self-pay | Admitting: Internal Medicine

## 2019-01-04 MED ORDER — ATORVASTATIN CALCIUM 10 MG PO TABS: 10.0000 mg | ORAL_TABLET | Freq: Every day | ORAL | 0 refills | Status: DC

## 2019-01-04 NOTE — Telephone Encounter (Signed)
Rx sent 

## 2019-01-04 NOTE — Telephone Encounter (Signed)
Copied from [place] [phone]. Topic: Quick Communication - Rx Refill/Question >> Jan 04, 2019  1:37 PM Celene Kras A wrote: Medication: atorvastatin (LIPITOR) 10 MG tablet  Has the patient contacted their pharmacy? Yes.  Pts daughter states the pharmacy faxed over a request for this medication on Monday. Pts daughter also states she will be out of  medication on Thursday. Please advise.  (Agent: If no, request that the patient contact the pharmacy for the refill.) (Agent: If yes, when and what did the pharmacy advise?)  Preferred Pharmacy (with phone number or street name): [facility] [phone]  Agent: Please be advised that RX refills may take up to 3 business days. We ask that you follow-up with your pharmacy.

## 2019-01-10 ENCOUNTER — Other Ambulatory Visit: Payer: Self-pay

## 2019-01-10 MED ORDER — TELMISARTAN 80 MG PO TABS: 80.0000 mg | ORAL_TABLET | Freq: Every day | ORAL | 0 refills | Status: DC

## 2019-02-09 ENCOUNTER — Other Ambulatory Visit: Payer: Self-pay

## 2019-02-09 MED ORDER — HYDRALAZINE HCL 50 MG PO TABS: 50.0000 mg | ORAL_TABLET | Freq: Three times a day (TID) | ORAL | 0 refills | Status: DC

## 2019-02-23 ENCOUNTER — Other Ambulatory Visit: Payer: Self-pay | Admitting: *Deleted

## 2019-02-23 MED ORDER — OMEPRAZOLE 20 MG PO CPDR: 20.0000 mg | DELAYED_RELEASE_CAPSULE | Freq: Every day | ORAL | 1 refills | Status: DC

## 2019-02-28 ENCOUNTER — Other Ambulatory Visit: Payer: Self-pay | Admitting: Internal Medicine

## 2019-02-28 DIAGNOSIS — H401133 Primary open-angle glaucoma, bilateral, severe stage: Secondary | ICD-10-CM | POA: Diagnosis not present

## 2019-02-28 DIAGNOSIS — H40033 Anatomical narrow angle, bilateral: Secondary | ICD-10-CM | POA: Diagnosis not present

## 2019-03-02 ENCOUNTER — Telehealth: Payer: Self-pay | Admitting: Interventional Cardiology

## 2019-03-02 NOTE — Telephone Encounter (Signed)
° °  Daughter called to inform the office that the patient has permanently relocated to [place].

## 2019-03-30 ENCOUNTER — Other Ambulatory Visit: Payer: Self-pay | Admitting: Internal Medicine

## 2024-03-11 DEATH — deceased
